# Patient Record
Sex: Male | Born: 1940 | Race: White | Hispanic: No | Marital: Single | Smoking: Former smoker
Health system: Southern US, Community
[De-identification: ages and names within clinical notes are randomized; demographics above are authoritative.]

## PROBLEM LIST (undated history)

## (undated) DIAGNOSIS — E785 Hyperlipidemia, unspecified: Secondary | ICD-10-CM

## (undated) DIAGNOSIS — N189 Chronic kidney disease, unspecified: Secondary | ICD-10-CM

## (undated) DIAGNOSIS — E119 Type 2 diabetes mellitus without complications: Secondary | ICD-10-CM

## (undated) DIAGNOSIS — I10 Essential (primary) hypertension: Secondary | ICD-10-CM

## (undated) HISTORY — DX: Chronic kidney disease, unspecified: N18.9

## (undated) HISTORY — DX: Essential (primary) hypertension: I10

## (undated) HISTORY — DX: Type 2 diabetes mellitus without complications: E11.9

## (undated) HISTORY — DX: Hyperlipidemia, unspecified: E78.5

---

## 2001-10-21 ENCOUNTER — Encounter: Admission: RE | Admit: 2001-10-21 | Discharge: 2001-10-21 | Payer: Self-pay | Admitting: *Deleted

## 2001-10-28 ENCOUNTER — Encounter: Admission: RE | Admit: 2001-10-28 | Discharge: 2001-10-28 | Payer: Self-pay | Admitting: Internal Medicine

## 2003-04-13 ENCOUNTER — Encounter (INDEPENDENT_AMBULATORY_CARE_PROVIDER_SITE_OTHER): Payer: Self-pay | Admitting: *Deleted

## 2003-04-13 ENCOUNTER — Ambulatory Visit (HOSPITAL_COMMUNITY): Admission: RE | Admit: 2003-04-13 | Discharge: 2003-04-13 | Payer: Self-pay | Admitting: *Deleted

## 2003-05-18 ENCOUNTER — Ambulatory Visit (HOSPITAL_COMMUNITY): Admission: RE | Admit: 2003-05-18 | Discharge: 2003-05-18 | Payer: Self-pay | Admitting: *Deleted

## 2003-05-18 ENCOUNTER — Encounter (INDEPENDENT_AMBULATORY_CARE_PROVIDER_SITE_OTHER): Payer: Self-pay | Admitting: Specialist

## 2003-07-20 ENCOUNTER — Encounter (INDEPENDENT_AMBULATORY_CARE_PROVIDER_SITE_OTHER): Payer: Self-pay | Admitting: *Deleted

## 2003-07-20 ENCOUNTER — Ambulatory Visit (HOSPITAL_COMMUNITY): Admission: RE | Admit: 2003-07-20 | Discharge: 2003-07-20 | Payer: Self-pay | Admitting: *Deleted

## 2003-11-30 ENCOUNTER — Encounter (INDEPENDENT_AMBULATORY_CARE_PROVIDER_SITE_OTHER): Payer: Self-pay | Admitting: Specialist

## 2003-11-30 ENCOUNTER — Ambulatory Visit (HOSPITAL_COMMUNITY): Admission: RE | Admit: 2003-11-30 | Discharge: 2003-11-30 | Payer: Self-pay | Admitting: *Deleted

## 2004-05-25 ENCOUNTER — Encounter (INDEPENDENT_AMBULATORY_CARE_PROVIDER_SITE_OTHER): Payer: Self-pay | Admitting: *Deleted

## 2004-05-25 ENCOUNTER — Ambulatory Visit (HOSPITAL_COMMUNITY): Admission: RE | Admit: 2004-05-25 | Discharge: 2004-05-25 | Payer: Self-pay | Admitting: *Deleted

## 2005-01-11 ENCOUNTER — Emergency Department (HOSPITAL_COMMUNITY): Admission: EM | Admit: 2005-01-11 | Discharge: 2005-01-12 | Payer: Self-pay | Admitting: Emergency Medicine

## 2008-03-10 ENCOUNTER — Encounter: Admission: RE | Admit: 2008-03-10 | Discharge: 2008-03-10 | Payer: Self-pay | Admitting: Family Medicine

## 2008-03-11 ENCOUNTER — Encounter: Admission: RE | Admit: 2008-03-11 | Discharge: 2008-03-11 | Payer: Self-pay | Admitting: Family Medicine

## 2010-07-01 ENCOUNTER — Ambulatory Visit (HOSPITAL_COMMUNITY): Admission: RE | Admit: 2010-07-01 | Discharge: 2010-07-01 | Payer: Self-pay | Admitting: Nephrology

## 2010-07-08 ENCOUNTER — Encounter (HOSPITAL_COMMUNITY): Admission: RE | Admit: 2010-07-08 | Discharge: 2010-09-15 | Payer: Self-pay | Admitting: Nephrology

## 2010-07-12 ENCOUNTER — Encounter: Admission: RE | Admit: 2010-07-12 | Discharge: 2010-07-12 | Payer: Self-pay | Admitting: Nephrology

## 2011-03-03 NOTE — Op Note (Signed)
   NAME:  [name], Douglas                       ACCOUNT NO.:  [id number]   MEDICAL RECORD NO.:  [id number]                   PATIENT TYPE:  AMB   LOCATION:  ENDO                                 FACILITY:  [facility]   PHYSICIAN:  Althea Grimmer. Luther Parody, M.D.            DATE OF BIRTH:  1940/12/21   DATE OF PROCEDURE:  05/18/2003  DATE OF DISCHARGE:                                 OPERATIVE REPORT   PROCEDURE PERFORMED:  Flexible sigmoidoscopy with shave polypectomy and ERBE  photocoagulation.   INDICATIONS FOR PROCEDURE:  A large villous adenoma of the rectum  incompletely removed at prior colonoscopy on June 28.   PREPARATION:  The patient is n.p.o. since midnight having taken Phospho-Soda  prep and a clear liquid diet.  The mucosa is clean.  Depth of insertion 30  cm.   DESCRIPTION OF PROCEDURE:  The Olympus video colonoscope was inserted via  the rectum and advanced only to 30 cm since a prior complete colonoscopy has  been performed recently.  On withdrawal, the rectal villous adenoma was  located extending from approximately 4 or 5 cm from the anal verge almost to  the anal verge itself.  The polyp was extensively removed  with electro  snare cautery shave technique.  It appeared much smaller than it had at the  original treatment.  When it had largely been removed, the base was  photocoagulated with the ERBE photocoagulation instrument. There was no  significant bleeding.  The patient tolerated the procedure well without  complications or pain.  He will be observed in recovery for one hour and  discharged if alert with a benign abdomen.  He is advised not to take  aspirin for the next 10 days.  He will be sent a biopsy report.  I would  like to see him in the office within six to seven weeks and probably  reassess this site endoscopically in approximately two months to make  certain the polyp is completely removed.                                               Althea Grimmer. Luther Parody,  M.D.    PJS/MEDQ  D:  05/18/2003  T:  05/18/2003  Job:  [id number]   cc:   Schuyler Amor, M.D.  [address]  [place], [place] [address]  Fax: [phone]

## 2011-05-18 ENCOUNTER — Ambulatory Visit: Payer: Federal, State, Local not specified - PPO | Attending: Family Medicine

## 2011-05-18 DIAGNOSIS — R293 Abnormal posture: Secondary | ICD-10-CM | POA: Insufficient documentation

## 2011-05-18 DIAGNOSIS — M546 Pain in thoracic spine: Secondary | ICD-10-CM | POA: Insufficient documentation

## 2011-05-18 DIAGNOSIS — M545 Low back pain, unspecified: Secondary | ICD-10-CM | POA: Insufficient documentation

## 2011-05-18 DIAGNOSIS — IMO0001 Reserved for inherently not codable concepts without codable children: Secondary | ICD-10-CM | POA: Insufficient documentation

## 2011-05-23 ENCOUNTER — Ambulatory Visit: Payer: Federal, State, Local not specified - PPO

## 2011-05-25 ENCOUNTER — Ambulatory Visit: Payer: Federal, State, Local not specified - PPO

## 2011-05-30 ENCOUNTER — Ambulatory Visit: Payer: Federal, State, Local not specified - PPO | Admitting: Physical Therapy

## 2011-06-01 ENCOUNTER — Ambulatory Visit: Payer: Federal, State, Local not specified - PPO

## 2011-06-08 ENCOUNTER — Ambulatory Visit: Payer: Federal, State, Local not specified - PPO

## 2011-06-15 ENCOUNTER — Ambulatory Visit: Payer: Federal, State, Local not specified - PPO

## 2011-06-20 ENCOUNTER — Ambulatory Visit: Payer: Medicare Other | Attending: Family Medicine | Admitting: Physical Therapy

## 2011-06-20 DIAGNOSIS — R293 Abnormal posture: Secondary | ICD-10-CM | POA: Insufficient documentation

## 2011-06-20 DIAGNOSIS — IMO0001 Reserved for inherently not codable concepts without codable children: Secondary | ICD-10-CM | POA: Insufficient documentation

## 2011-06-20 DIAGNOSIS — M545 Low back pain, unspecified: Secondary | ICD-10-CM | POA: Insufficient documentation

## 2011-06-20 DIAGNOSIS — M546 Pain in thoracic spine: Secondary | ICD-10-CM | POA: Insufficient documentation

## 2012-02-13 ENCOUNTER — Ambulatory Visit
Admission: RE | Admit: 2012-02-13 | Discharge: 2012-02-13 | Disposition: A | Discharge status: Home / Self Care | Payer: Federal, State, Local not specified - PPO | Source: Ambulatory Visit | Attending: Family Medicine | Admitting: Family Medicine

## 2012-02-13 ENCOUNTER — Other Ambulatory Visit: Payer: Self-pay | Admitting: Family Medicine

## 2012-02-13 DIAGNOSIS — M25539 Pain in unspecified wrist: Secondary | ICD-10-CM

## 2012-05-21 ENCOUNTER — Other Ambulatory Visit (HOSPITAL_COMMUNITY): Payer: Self-pay | Admitting: *Deleted

## 2012-05-22 ENCOUNTER — Encounter (HOSPITAL_COMMUNITY)
Admission: RE | Admit: 2012-05-22 | Discharge: 2012-05-22 | Disposition: A | Discharge status: Home / Self Care | Payer: Medicare Other | Source: Ambulatory Visit | Attending: Nephrology | Admitting: Nephrology

## 2012-05-22 DIAGNOSIS — D509 Iron deficiency anemia, unspecified: Secondary | ICD-10-CM | POA: Insufficient documentation

## 2012-05-22 MED ORDER — FERUMOXYTOL INJECTION 510 MG/17 ML
510.0000 mg | INTRAVENOUS | Status: DC
  Administered 2012-05-22: 510 mg via INTRAVENOUS

## 2012-05-22 MED ORDER — FERUMOXYTOL INJECTION 510 MG/17 ML
INTRAVENOUS | Status: AC
  Filled 2012-05-22: qty 17

## 2012-05-22 MED ORDER — SODIUM CHLORIDE 0.9 % IV SOLN
INTRAVENOUS | Status: DC
  Administered 2012-05-22: 12:00:00 via INTRAVENOUS

## 2012-05-23 ENCOUNTER — Other Ambulatory Visit: Payer: Self-pay | Admitting: Family Medicine

## 2012-05-23 DIAGNOSIS — E611 Iron deficiency: Secondary | ICD-10-CM

## 2012-05-28 ENCOUNTER — Other Ambulatory Visit (HOSPITAL_COMMUNITY): Payer: Self-pay | Admitting: *Deleted

## 2012-05-29 ENCOUNTER — Encounter (HOSPITAL_COMMUNITY)
Admission: RE | Admit: 2012-05-29 | Discharge: 2012-05-29 | Disposition: A | Discharge status: Home / Self Care | Payer: Medicare Other | Source: Ambulatory Visit | Attending: Nephrology | Admitting: Nephrology

## 2012-05-29 MED ORDER — FERUMOXYTOL INJECTION 510 MG/17 ML
510.0000 mg | INTRAVENOUS | Status: AC
  Administered 2012-05-29: 510 mg via INTRAVENOUS
  Filled 2012-05-29: qty 17

## 2012-05-29 MED ORDER — SODIUM CHLORIDE 0.9 % IV SOLN
INTRAVENOUS | Status: AC
  Administered 2012-05-29: 08:00:00 via INTRAVENOUS

## 2012-11-22 DIAGNOSIS — R972 Elevated prostate specific antigen [PSA]: Secondary | ICD-10-CM | POA: Diagnosis not present

## 2012-11-22 DIAGNOSIS — N401 Enlarged prostate with lower urinary tract symptoms: Secondary | ICD-10-CM | POA: Diagnosis not present

## 2012-12-09 DIAGNOSIS — E782 Mixed hyperlipidemia: Secondary | ICD-10-CM | POA: Diagnosis not present

## 2012-12-09 DIAGNOSIS — M109 Gout, unspecified: Secondary | ICD-10-CM | POA: Diagnosis not present

## 2012-12-09 DIAGNOSIS — E1129 Type 2 diabetes mellitus with other diabetic kidney complication: Secondary | ICD-10-CM | POA: Diagnosis not present

## 2012-12-09 DIAGNOSIS — I10 Essential (primary) hypertension: Secondary | ICD-10-CM | POA: Diagnosis not present

## 2013-01-09 DIAGNOSIS — R7989 Other specified abnormal findings of blood chemistry: Secondary | ICD-10-CM | POA: Diagnosis not present

## 2013-01-09 DIAGNOSIS — E785 Hyperlipidemia, unspecified: Secondary | ICD-10-CM | POA: Diagnosis not present

## 2013-01-09 DIAGNOSIS — E1165 Type 2 diabetes mellitus with hyperglycemia: Secondary | ICD-10-CM | POA: Diagnosis not present

## 2013-01-09 DIAGNOSIS — I10 Essential (primary) hypertension: Secondary | ICD-10-CM | POA: Diagnosis not present

## 2013-01-09 DIAGNOSIS — Z794 Long term (current) use of insulin: Secondary | ICD-10-CM | POA: Diagnosis not present

## 2013-01-13 DIAGNOSIS — E039 Hypothyroidism, unspecified: Secondary | ICD-10-CM | POA: Diagnosis not present

## 2013-01-13 DIAGNOSIS — Z794 Long term (current) use of insulin: Secondary | ICD-10-CM | POA: Diagnosis not present

## 2013-01-13 DIAGNOSIS — E1129 Type 2 diabetes mellitus with other diabetic kidney complication: Secondary | ICD-10-CM | POA: Diagnosis not present

## 2013-01-13 DIAGNOSIS — E785 Hyperlipidemia, unspecified: Secondary | ICD-10-CM | POA: Diagnosis not present

## 2013-01-13 DIAGNOSIS — I10 Essential (primary) hypertension: Secondary | ICD-10-CM | POA: Diagnosis not present

## 2013-03-04 DIAGNOSIS — M109 Gout, unspecified: Secondary | ICD-10-CM | POA: Diagnosis not present

## 2013-03-04 DIAGNOSIS — I129 Hypertensive chronic kidney disease with stage 1 through stage 4 chronic kidney disease, or unspecified chronic kidney disease: Secondary | ICD-10-CM | POA: Diagnosis not present

## 2013-03-04 DIAGNOSIS — E119 Type 2 diabetes mellitus without complications: Secondary | ICD-10-CM | POA: Diagnosis not present

## 2013-05-11 DIAGNOSIS — L03119 Cellulitis of unspecified part of limb: Secondary | ICD-10-CM | POA: Diagnosis not present

## 2013-05-11 DIAGNOSIS — S61409A Unspecified open wound of unspecified hand, initial encounter: Secondary | ICD-10-CM | POA: Diagnosis not present

## 2013-05-11 DIAGNOSIS — L02519 Cutaneous abscess of unspecified hand: Secondary | ICD-10-CM | POA: Diagnosis not present

## 2013-05-12 DIAGNOSIS — E039 Hypothyroidism, unspecified: Secondary | ICD-10-CM | POA: Diagnosis not present

## 2013-05-12 DIAGNOSIS — S61409A Unspecified open wound of unspecified hand, initial encounter: Secondary | ICD-10-CM | POA: Diagnosis not present

## 2013-06-09 DIAGNOSIS — M109 Gout, unspecified: Secondary | ICD-10-CM | POA: Diagnosis not present

## 2013-06-09 DIAGNOSIS — E039 Hypothyroidism, unspecified: Secondary | ICD-10-CM | POA: Diagnosis not present

## 2013-06-09 DIAGNOSIS — E1129 Type 2 diabetes mellitus with other diabetic kidney complication: Secondary | ICD-10-CM | POA: Diagnosis not present

## 2013-06-09 DIAGNOSIS — I10 Essential (primary) hypertension: Secondary | ICD-10-CM | POA: Diagnosis not present

## 2013-06-09 DIAGNOSIS — E785 Hyperlipidemia, unspecified: Secondary | ICD-10-CM | POA: Diagnosis not present

## 2013-07-03 DIAGNOSIS — R1110 Vomiting, unspecified: Secondary | ICD-10-CM | POA: Diagnosis not present

## 2013-07-03 DIAGNOSIS — R5381 Other malaise: Secondary | ICD-10-CM | POA: Diagnosis not present

## 2013-07-07 DIAGNOSIS — D509 Iron deficiency anemia, unspecified: Secondary | ICD-10-CM | POA: Diagnosis not present

## 2013-07-07 DIAGNOSIS — D649 Anemia, unspecified: Secondary | ICD-10-CM | POA: Diagnosis not present

## 2013-07-18 DIAGNOSIS — Z23 Encounter for immunization: Secondary | ICD-10-CM | POA: Diagnosis not present

## 2013-07-22 DIAGNOSIS — D509 Iron deficiency anemia, unspecified: Secondary | ICD-10-CM | POA: Diagnosis not present

## 2013-08-11 DIAGNOSIS — D649 Anemia, unspecified: Secondary | ICD-10-CM | POA: Diagnosis not present

## 2013-09-04 DIAGNOSIS — E119 Type 2 diabetes mellitus without complications: Secondary | ICD-10-CM | POA: Diagnosis not present

## 2013-09-04 DIAGNOSIS — D649 Anemia, unspecified: Secondary | ICD-10-CM | POA: Diagnosis not present

## 2013-09-04 DIAGNOSIS — I129 Hypertensive chronic kidney disease with stage 1 through stage 4 chronic kidney disease, or unspecified chronic kidney disease: Secondary | ICD-10-CM | POA: Diagnosis not present

## 2013-09-29 DIAGNOSIS — E039 Hypothyroidism, unspecified: Secondary | ICD-10-CM | POA: Diagnosis not present

## 2013-09-29 DIAGNOSIS — E785 Hyperlipidemia, unspecified: Secondary | ICD-10-CM | POA: Diagnosis not present

## 2013-09-29 DIAGNOSIS — I10 Essential (primary) hypertension: Secondary | ICD-10-CM | POA: Diagnosis not present

## 2013-09-29 DIAGNOSIS — E1129 Type 2 diabetes mellitus with other diabetic kidney complication: Secondary | ICD-10-CM | POA: Diagnosis not present

## 2013-09-29 DIAGNOSIS — Z79899 Other long term (current) drug therapy: Secondary | ICD-10-CM | POA: Diagnosis not present

## 2013-11-10 DIAGNOSIS — Z79899 Other long term (current) drug therapy: Secondary | ICD-10-CM | POA: Diagnosis not present

## 2013-11-10 DIAGNOSIS — E785 Hyperlipidemia, unspecified: Secondary | ICD-10-CM | POA: Diagnosis not present

## 2013-11-24 DIAGNOSIS — R972 Elevated prostate specific antigen [PSA]: Secondary | ICD-10-CM | POA: Diagnosis not present

## 2013-11-30 ENCOUNTER — Encounter: Payer: Self-pay | Admitting: *Deleted

## 2013-11-30 DIAGNOSIS — I10 Essential (primary) hypertension: Secondary | ICD-10-CM | POA: Insufficient documentation

## 2013-11-30 DIAGNOSIS — E119 Type 2 diabetes mellitus without complications: Secondary | ICD-10-CM

## 2013-11-30 DIAGNOSIS — E785 Hyperlipidemia, unspecified: Secondary | ICD-10-CM

## 2013-12-10 DIAGNOSIS — E1129 Type 2 diabetes mellitus with other diabetic kidney complication: Secondary | ICD-10-CM | POA: Diagnosis not present

## 2013-12-10 DIAGNOSIS — N183 Chronic kidney disease, stage 3 unspecified: Secondary | ICD-10-CM | POA: Diagnosis not present

## 2013-12-24 DIAGNOSIS — Z0000 Encounter for general adult medical examination without abnormal findings: Secondary | ICD-10-CM | POA: Diagnosis not present

## 2014-02-20 DIAGNOSIS — R972 Elevated prostate specific antigen [PSA]: Secondary | ICD-10-CM | POA: Diagnosis not present

## 2014-02-20 DIAGNOSIS — N401 Enlarged prostate with lower urinary tract symptoms: Secondary | ICD-10-CM | POA: Diagnosis not present

## 2014-03-11 DIAGNOSIS — R972 Elevated prostate specific antigen [PSA]: Secondary | ICD-10-CM | POA: Diagnosis not present

## 2014-03-25 DIAGNOSIS — Z23 Encounter for immunization: Secondary | ICD-10-CM | POA: Diagnosis not present

## 2014-03-25 DIAGNOSIS — E1129 Type 2 diabetes mellitus with other diabetic kidney complication: Secondary | ICD-10-CM | POA: Diagnosis not present

## 2014-03-25 DIAGNOSIS — N183 Chronic kidney disease, stage 3 unspecified: Secondary | ICD-10-CM | POA: Diagnosis not present

## 2014-03-30 DIAGNOSIS — E785 Hyperlipidemia, unspecified: Secondary | ICD-10-CM | POA: Diagnosis not present

## 2014-03-30 DIAGNOSIS — E039 Hypothyroidism, unspecified: Secondary | ICD-10-CM | POA: Diagnosis not present

## 2014-03-30 DIAGNOSIS — I10 Essential (primary) hypertension: Secondary | ICD-10-CM | POA: Diagnosis not present

## 2014-03-30 DIAGNOSIS — M109 Gout, unspecified: Secondary | ICD-10-CM | POA: Diagnosis not present

## 2014-05-25 DIAGNOSIS — E039 Hypothyroidism, unspecified: Secondary | ICD-10-CM | POA: Diagnosis not present

## 2014-08-07 DIAGNOSIS — Z23 Encounter for immunization: Secondary | ICD-10-CM | POA: Diagnosis not present

## 2014-09-01 DIAGNOSIS — D649 Anemia, unspecified: Secondary | ICD-10-CM | POA: Diagnosis not present

## 2014-09-01 DIAGNOSIS — N183 Chronic kidney disease, stage 3 (moderate): Secondary | ICD-10-CM | POA: Diagnosis not present

## 2014-09-01 DIAGNOSIS — E119 Type 2 diabetes mellitus without complications: Secondary | ICD-10-CM | POA: Diagnosis not present

## 2014-09-01 DIAGNOSIS — I10 Essential (primary) hypertension: Secondary | ICD-10-CM | POA: Diagnosis not present

## 2014-09-15 DIAGNOSIS — R351 Nocturia: Secondary | ICD-10-CM | POA: Diagnosis not present

## 2014-09-15 DIAGNOSIS — N401 Enlarged prostate with lower urinary tract symptoms: Secondary | ICD-10-CM | POA: Diagnosis not present

## 2014-09-15 DIAGNOSIS — R972 Elevated prostate specific antigen [PSA]: Secondary | ICD-10-CM | POA: Diagnosis not present

## 2014-09-28 DIAGNOSIS — E119 Type 2 diabetes mellitus without complications: Secondary | ICD-10-CM | POA: Diagnosis not present

## 2014-09-29 DIAGNOSIS — E1122 Type 2 diabetes mellitus with diabetic chronic kidney disease: Secondary | ICD-10-CM | POA: Diagnosis not present

## 2014-09-29 DIAGNOSIS — N183 Chronic kidney disease, stage 3 (moderate): Secondary | ICD-10-CM | POA: Diagnosis not present

## 2014-09-30 DIAGNOSIS — M109 Gout, unspecified: Secondary | ICD-10-CM | POA: Diagnosis not present

## 2014-09-30 DIAGNOSIS — E785 Hyperlipidemia, unspecified: Secondary | ICD-10-CM | POA: Diagnosis not present

## 2014-09-30 DIAGNOSIS — I10 Essential (primary) hypertension: Secondary | ICD-10-CM | POA: Diagnosis not present

## 2014-09-30 DIAGNOSIS — E039 Hypothyroidism, unspecified: Secondary | ICD-10-CM | POA: Diagnosis not present

## 2014-12-02 DIAGNOSIS — E039 Hypothyroidism, unspecified: Secondary | ICD-10-CM | POA: Diagnosis not present

## 2015-01-11 DIAGNOSIS — E039 Hypothyroidism, unspecified: Secondary | ICD-10-CM | POA: Diagnosis not present

## 2015-01-11 DIAGNOSIS — E1122 Type 2 diabetes mellitus with diabetic chronic kidney disease: Secondary | ICD-10-CM | POA: Diagnosis not present

## 2015-01-13 DIAGNOSIS — N183 Chronic kidney disease, stage 3 (moderate): Secondary | ICD-10-CM | POA: Diagnosis not present

## 2015-01-13 DIAGNOSIS — Z1389 Encounter for screening for other disorder: Secondary | ICD-10-CM | POA: Diagnosis not present

## 2015-01-13 DIAGNOSIS — E1122 Type 2 diabetes mellitus with diabetic chronic kidney disease: Secondary | ICD-10-CM | POA: Diagnosis not present

## 2015-01-13 DIAGNOSIS — Z794 Long term (current) use of insulin: Secondary | ICD-10-CM | POA: Diagnosis not present

## 2015-04-05 DIAGNOSIS — E039 Hypothyroidism, unspecified: Secondary | ICD-10-CM | POA: Diagnosis not present

## 2015-04-05 DIAGNOSIS — M109 Gout, unspecified: Secondary | ICD-10-CM | POA: Diagnosis not present

## 2015-04-05 DIAGNOSIS — I10 Essential (primary) hypertension: Secondary | ICD-10-CM | POA: Diagnosis not present

## 2015-04-05 DIAGNOSIS — E785 Hyperlipidemia, unspecified: Secondary | ICD-10-CM | POA: Diagnosis not present

## 2015-04-22 DIAGNOSIS — N183 Chronic kidney disease, stage 3 (moderate): Secondary | ICD-10-CM | POA: Diagnosis not present

## 2015-04-22 DIAGNOSIS — Z794 Long term (current) use of insulin: Secondary | ICD-10-CM | POA: Diagnosis not present

## 2015-04-22 DIAGNOSIS — E1122 Type 2 diabetes mellitus with diabetic chronic kidney disease: Secondary | ICD-10-CM | POA: Diagnosis not present

## 2015-08-30 DIAGNOSIS — N401 Enlarged prostate with lower urinary tract symptoms: Secondary | ICD-10-CM | POA: Diagnosis not present

## 2015-09-20 DIAGNOSIS — N401 Enlarged prostate with lower urinary tract symptoms: Secondary | ICD-10-CM | POA: Diagnosis not present

## 2015-09-20 DIAGNOSIS — R351 Nocturia: Secondary | ICD-10-CM | POA: Diagnosis not present

## 2015-09-20 DIAGNOSIS — R972 Elevated prostate specific antigen [PSA]: Secondary | ICD-10-CM | POA: Diagnosis not present

## 2015-09-20 DIAGNOSIS — N138 Other obstructive and reflux uropathy: Secondary | ICD-10-CM | POA: Diagnosis not present

## 2015-09-29 DIAGNOSIS — E785 Hyperlipidemia, unspecified: Secondary | ICD-10-CM | POA: Diagnosis not present

## 2015-09-29 DIAGNOSIS — M109 Gout, unspecified: Secondary | ICD-10-CM | POA: Diagnosis not present

## 2015-09-29 DIAGNOSIS — Z0000 Encounter for general adult medical examination without abnormal findings: Secondary | ICD-10-CM | POA: Diagnosis not present

## 2015-09-29 DIAGNOSIS — I10 Essential (primary) hypertension: Secondary | ICD-10-CM | POA: Diagnosis not present

## 2015-09-29 DIAGNOSIS — E1122 Type 2 diabetes mellitus with diabetic chronic kidney disease: Secondary | ICD-10-CM | POA: Diagnosis not present

## 2015-09-29 DIAGNOSIS — N183 Chronic kidney disease, stage 3 (moderate): Secondary | ICD-10-CM | POA: Diagnosis not present

## 2015-09-29 DIAGNOSIS — N400 Enlarged prostate without lower urinary tract symptoms: Secondary | ICD-10-CM | POA: Diagnosis not present

## 2015-09-29 DIAGNOSIS — E039 Hypothyroidism, unspecified: Secondary | ICD-10-CM | POA: Diagnosis not present

## 2015-10-01 ENCOUNTER — Other Ambulatory Visit: Payer: Self-pay | Admitting: Family Medicine

## 2015-10-01 DIAGNOSIS — R945 Abnormal results of liver function studies: Principal | ICD-10-CM

## 2015-10-01 DIAGNOSIS — R7989 Other specified abnormal findings of blood chemistry: Secondary | ICD-10-CM

## 2015-10-06 DIAGNOSIS — E119 Type 2 diabetes mellitus without complications: Secondary | ICD-10-CM | POA: Diagnosis not present

## 2015-10-08 ENCOUNTER — Ambulatory Visit
Admission: RE | Admit: 2015-10-08 | Discharge: 2015-10-08 | Disposition: A | Discharge status: Home / Self Care | Payer: Medicare Other | Source: Ambulatory Visit | Attending: Family Medicine | Admitting: Family Medicine

## 2015-10-08 DIAGNOSIS — R7989 Other specified abnormal findings of blood chemistry: Secondary | ICD-10-CM

## 2015-10-08 DIAGNOSIS — R945 Abnormal results of liver function studies: Principal | ICD-10-CM

## 2015-10-27 DIAGNOSIS — E1122 Type 2 diabetes mellitus with diabetic chronic kidney disease: Secondary | ICD-10-CM | POA: Diagnosis not present

## 2015-10-27 DIAGNOSIS — Z794 Long term (current) use of insulin: Secondary | ICD-10-CM | POA: Diagnosis not present

## 2015-10-27 DIAGNOSIS — R945 Abnormal results of liver function studies: Secondary | ICD-10-CM | POA: Diagnosis not present

## 2015-10-27 DIAGNOSIS — N183 Chronic kidney disease, stage 3 (moderate): Secondary | ICD-10-CM | POA: Diagnosis not present

## 2015-11-11 DIAGNOSIS — K2270 Barrett's esophagus without dysplasia: Secondary | ICD-10-CM | POA: Diagnosis not present

## 2015-11-11 DIAGNOSIS — Z86010 Personal history of colonic polyps: Secondary | ICD-10-CM | POA: Diagnosis not present

## 2015-11-11 DIAGNOSIS — K219 Gastro-esophageal reflux disease without esophagitis: Secondary | ICD-10-CM | POA: Diagnosis not present

## 2015-11-22 DIAGNOSIS — E119 Type 2 diabetes mellitus without complications: Secondary | ICD-10-CM | POA: Diagnosis not present

## 2015-11-22 DIAGNOSIS — Z794 Long term (current) use of insulin: Secondary | ICD-10-CM | POA: Diagnosis not present

## 2015-11-30 DIAGNOSIS — R945 Abnormal results of liver function studies: Secondary | ICD-10-CM | POA: Diagnosis not present

## 2015-12-27 DIAGNOSIS — K640 First degree hemorrhoids: Secondary | ICD-10-CM | POA: Diagnosis not present

## 2015-12-27 DIAGNOSIS — Z86010 Personal history of colonic polyps: Secondary | ICD-10-CM | POA: Diagnosis not present

## 2015-12-27 DIAGNOSIS — K5730 Diverticulosis of large intestine without perforation or abscess without bleeding: Secondary | ICD-10-CM | POA: Diagnosis not present

## 2015-12-27 DIAGNOSIS — K219 Gastro-esophageal reflux disease without esophagitis: Secondary | ICD-10-CM | POA: Diagnosis not present

## 2015-12-27 DIAGNOSIS — K317 Polyp of stomach and duodenum: Secondary | ICD-10-CM | POA: Diagnosis not present

## 2015-12-27 DIAGNOSIS — K3189 Other diseases of stomach and duodenum: Secondary | ICD-10-CM | POA: Diagnosis not present

## 2015-12-27 DIAGNOSIS — K2270 Barrett's esophagus without dysplasia: Secondary | ICD-10-CM | POA: Diagnosis not present

## 2015-12-27 DIAGNOSIS — K621 Rectal polyp: Secondary | ICD-10-CM | POA: Diagnosis not present

## 2016-03-08 DIAGNOSIS — R945 Abnormal results of liver function studies: Secondary | ICD-10-CM | POA: Diagnosis not present

## 2016-04-25 DIAGNOSIS — Z794 Long term (current) use of insulin: Secondary | ICD-10-CM | POA: Diagnosis not present

## 2016-04-25 DIAGNOSIS — E1122 Type 2 diabetes mellitus with diabetic chronic kidney disease: Secondary | ICD-10-CM | POA: Diagnosis not present

## 2016-04-25 DIAGNOSIS — N183 Chronic kidney disease, stage 3 (moderate): Secondary | ICD-10-CM | POA: Diagnosis not present

## 2016-04-25 DIAGNOSIS — D509 Iron deficiency anemia, unspecified: Secondary | ICD-10-CM | POA: Diagnosis not present

## 2016-04-25 DIAGNOSIS — R945 Abnormal results of liver function studies: Secondary | ICD-10-CM | POA: Diagnosis not present

## 2016-06-27 DIAGNOSIS — Z23 Encounter for immunization: Secondary | ICD-10-CM | POA: Diagnosis not present

## 2016-09-05 DIAGNOSIS — J329 Chronic sinusitis, unspecified: Secondary | ICD-10-CM | POA: Diagnosis not present

## 2016-09-26 ENCOUNTER — Encounter (HOSPITAL_COMMUNITY): Payer: Self-pay | Admitting: Emergency Medicine

## 2016-09-26 ENCOUNTER — Emergency Department (HOSPITAL_COMMUNITY): Payer: Medicare Other

## 2016-09-26 ENCOUNTER — Inpatient Hospital Stay (HOSPITAL_COMMUNITY)
Admission: EM | Admit: 2016-09-26 | Discharge: 2016-10-05 | DRG: 064 | Disposition: A | Discharge status: Rehabilitation Facility | Payer: Medicare Other | Attending: Internal Medicine | Admitting: Internal Medicine

## 2016-09-26 DIAGNOSIS — N390 Urinary tract infection, site not specified: Secondary | ICD-10-CM | POA: Diagnosis not present

## 2016-09-26 DIAGNOSIS — I69320 Aphasia following cerebral infarction: Secondary | ICD-10-CM | POA: Diagnosis not present

## 2016-09-26 DIAGNOSIS — R748 Abnormal levels of other serum enzymes: Secondary | ICD-10-CM | POA: Diagnosis not present

## 2016-09-26 DIAGNOSIS — I69390 Apraxia following cerebral infarction: Secondary | ICD-10-CM | POA: Diagnosis not present

## 2016-09-26 DIAGNOSIS — N471 Phimosis: Secondary | ICD-10-CM | POA: Diagnosis present

## 2016-09-26 DIAGNOSIS — I6310 Cerebral infarction due to embolism of unspecified precerebral artery: Secondary | ICD-10-CM

## 2016-09-26 DIAGNOSIS — R092 Respiratory arrest: Secondary | ICD-10-CM | POA: Diagnosis not present

## 2016-09-26 DIAGNOSIS — E11649 Type 2 diabetes mellitus with hypoglycemia without coma: Secondary | ICD-10-CM | POA: Diagnosis present

## 2016-09-26 DIAGNOSIS — Z7984 Long term (current) use of oral hypoglycemic drugs: Secondary | ICD-10-CM | POA: Diagnosis not present

## 2016-09-26 DIAGNOSIS — Z66 Do not resuscitate: Secondary | ICD-10-CM | POA: Diagnosis present

## 2016-09-26 DIAGNOSIS — I10 Essential (primary) hypertension: Secondary | ICD-10-CM | POA: Diagnosis present

## 2016-09-26 DIAGNOSIS — Z79899 Other long term (current) drug therapy: Secondary | ICD-10-CM | POA: Diagnosis not present

## 2016-09-26 DIAGNOSIS — I639 Cerebral infarction, unspecified: Secondary | ICD-10-CM

## 2016-09-26 DIAGNOSIS — G9340 Encephalopathy, unspecified: Secondary | ICD-10-CM | POA: Diagnosis not present

## 2016-09-26 DIAGNOSIS — B9689 Other specified bacterial agents as the cause of diseases classified elsewhere: Secondary | ICD-10-CM | POA: Diagnosis present

## 2016-09-26 DIAGNOSIS — R338 Other retention of urine: Secondary | ICD-10-CM | POA: Diagnosis not present

## 2016-09-26 DIAGNOSIS — N183 Chronic kidney disease, stage 3 (moderate): Secondary | ICD-10-CM | POA: Diagnosis present

## 2016-09-26 DIAGNOSIS — D509 Iron deficiency anemia, unspecified: Secondary | ICD-10-CM | POA: Diagnosis not present

## 2016-09-26 DIAGNOSIS — I63512 Cerebral infarction due to unspecified occlusion or stenosis of left middle cerebral artery: Secondary | ICD-10-CM | POA: Diagnosis not present

## 2016-09-26 DIAGNOSIS — I6789 Other cerebrovascular disease: Secondary | ICD-10-CM | POA: Diagnosis not present

## 2016-09-26 DIAGNOSIS — R29810 Facial weakness: Secondary | ICD-10-CM | POA: Diagnosis present

## 2016-09-26 DIAGNOSIS — R451 Restlessness and agitation: Secondary | ICD-10-CM | POA: Diagnosis not present

## 2016-09-26 DIAGNOSIS — E1122 Type 2 diabetes mellitus with diabetic chronic kidney disease: Secondary | ICD-10-CM | POA: Diagnosis present

## 2016-09-26 DIAGNOSIS — K922 Gastrointestinal hemorrhage, unspecified: Secondary | ICD-10-CM | POA: Diagnosis present

## 2016-09-26 DIAGNOSIS — N189 Chronic kidney disease, unspecified: Secondary | ICD-10-CM

## 2016-09-26 DIAGNOSIS — E039 Hypothyroidism, unspecified: Secondary | ICD-10-CM | POA: Diagnosis present

## 2016-09-26 DIAGNOSIS — D631 Anemia in chronic kidney disease: Secondary | ICD-10-CM | POA: Diagnosis present

## 2016-09-26 DIAGNOSIS — R1310 Dysphagia, unspecified: Secondary | ICD-10-CM | POA: Diagnosis present

## 2016-09-26 DIAGNOSIS — I129 Hypertensive chronic kidney disease with stage 1 through stage 4 chronic kidney disease, or unspecified chronic kidney disease: Secondary | ICD-10-CM | POA: Diagnosis not present

## 2016-09-26 DIAGNOSIS — I6300 Cerebral infarction due to thrombosis of unspecified precerebral artery: Secondary | ICD-10-CM | POA: Diagnosis not present

## 2016-09-26 DIAGNOSIS — R4701 Aphasia: Secondary | ICD-10-CM | POA: Diagnosis present

## 2016-09-26 DIAGNOSIS — E785 Hyperlipidemia, unspecified: Secondary | ICD-10-CM | POA: Diagnosis present

## 2016-09-26 DIAGNOSIS — R000 Tachycardia, unspecified: Secondary | ICD-10-CM

## 2016-09-26 DIAGNOSIS — Z7982 Long term (current) use of aspirin: Secondary | ICD-10-CM | POA: Diagnosis not present

## 2016-09-26 DIAGNOSIS — E784 Other hyperlipidemia: Secondary | ICD-10-CM | POA: Diagnosis not present

## 2016-09-26 DIAGNOSIS — R918 Other nonspecific abnormal finding of lung field: Secondary | ICD-10-CM | POA: Diagnosis not present

## 2016-09-26 DIAGNOSIS — R7989 Other specified abnormal findings of blood chemistry: Secondary | ICD-10-CM

## 2016-09-26 DIAGNOSIS — T190XXA Foreign body in urethra, initial encounter: Secondary | ICD-10-CM | POA: Diagnosis not present

## 2016-09-26 DIAGNOSIS — G459 Transient cerebral ischemic attack, unspecified: Secondary | ICD-10-CM

## 2016-09-26 DIAGNOSIS — Z87891 Personal history of nicotine dependence: Secondary | ICD-10-CM

## 2016-09-26 DIAGNOSIS — E870 Hyperosmolality and hypernatremia: Secondary | ICD-10-CM | POA: Diagnosis present

## 2016-09-26 DIAGNOSIS — J690 Pneumonitis due to inhalation of food and vomit: Secondary | ICD-10-CM | POA: Diagnosis not present

## 2016-09-26 DIAGNOSIS — R339 Retention of urine, unspecified: Secondary | ICD-10-CM | POA: Diagnosis not present

## 2016-09-26 DIAGNOSIS — Z960 Presence of urogenital implants: Secondary | ICD-10-CM | POA: Diagnosis not present

## 2016-09-26 DIAGNOSIS — I6330 Cerebral infarction due to thrombosis of unspecified cerebral artery: Secondary | ICD-10-CM | POA: Diagnosis not present

## 2016-09-26 DIAGNOSIS — R2689 Other abnormalities of gait and mobility: Secondary | ICD-10-CM | POA: Diagnosis present

## 2016-09-26 DIAGNOSIS — R410 Disorientation, unspecified: Secondary | ICD-10-CM | POA: Diagnosis not present

## 2016-09-26 DIAGNOSIS — R4702 Dysphasia: Secondary | ICD-10-CM | POA: Diagnosis not present

## 2016-09-26 DIAGNOSIS — E119 Type 2 diabetes mellitus without complications: Secondary | ICD-10-CM | POA: Diagnosis not present

## 2016-09-26 DIAGNOSIS — D72829 Elevated white blood cell count, unspecified: Secondary | ICD-10-CM | POA: Diagnosis not present

## 2016-09-26 DIAGNOSIS — D696 Thrombocytopenia, unspecified: Secondary | ICD-10-CM | POA: Diagnosis not present

## 2016-09-26 DIAGNOSIS — Z781 Physical restraint status: Secondary | ICD-10-CM

## 2016-09-26 DIAGNOSIS — E876 Hypokalemia: Secondary | ICD-10-CM | POA: Diagnosis present

## 2016-09-26 DIAGNOSIS — E46 Unspecified protein-calorie malnutrition: Secondary | ICD-10-CM | POA: Diagnosis not present

## 2016-09-26 DIAGNOSIS — Z823 Family history of stroke: Secondary | ICD-10-CM

## 2016-09-26 DIAGNOSIS — I5021 Acute systolic (congestive) heart failure: Secondary | ICD-10-CM | POA: Diagnosis not present

## 2016-09-26 DIAGNOSIS — D62 Acute posthemorrhagic anemia: Secondary | ICD-10-CM | POA: Diagnosis present

## 2016-09-26 DIAGNOSIS — J189 Pneumonia, unspecified organism: Secondary | ICD-10-CM | POA: Diagnosis not present

## 2016-09-26 DIAGNOSIS — R0902 Hypoxemia: Secondary | ICD-10-CM

## 2016-09-26 DIAGNOSIS — I63412 Cerebral infarction due to embolism of left middle cerebral artery: Secondary | ICD-10-CM | POA: Diagnosis not present

## 2016-09-26 DIAGNOSIS — D500 Iron deficiency anemia secondary to blood loss (chronic): Secondary | ICD-10-CM | POA: Diagnosis not present

## 2016-09-26 DIAGNOSIS — G8191 Hemiplegia, unspecified affecting right dominant side: Secondary | ICD-10-CM | POA: Diagnosis present

## 2016-09-26 DIAGNOSIS — D649 Anemia, unspecified: Secondary | ICD-10-CM | POA: Diagnosis present

## 2016-09-26 DIAGNOSIS — E1159 Type 2 diabetes mellitus with other circulatory complications: Secondary | ICD-10-CM | POA: Diagnosis not present

## 2016-09-26 DIAGNOSIS — F329 Major depressive disorder, single episode, unspecified: Secondary | ICD-10-CM | POA: Diagnosis present

## 2016-09-26 DIAGNOSIS — I63312 Cerebral infarction due to thrombosis of left middle cerebral artery: Secondary | ICD-10-CM | POA: Diagnosis not present

## 2016-09-26 DIAGNOSIS — R7309 Other abnormal glucose: Secondary | ICD-10-CM | POA: Diagnosis not present

## 2016-09-26 DIAGNOSIS — E1142 Type 2 diabetes mellitus with diabetic polyneuropathy: Secondary | ICD-10-CM | POA: Diagnosis present

## 2016-09-26 DIAGNOSIS — I69351 Hemiplegia and hemiparesis following cerebral infarction affecting right dominant side: Secondary | ICD-10-CM | POA: Diagnosis not present

## 2016-09-26 DIAGNOSIS — I69391 Dysphagia following cerebral infarction: Secondary | ICD-10-CM | POA: Diagnosis not present

## 2016-09-26 DIAGNOSIS — R778 Other specified abnormalities of plasma proteins: Secondary | ICD-10-CM | POA: Diagnosis present

## 2016-09-26 DIAGNOSIS — N179 Acute kidney failure, unspecified: Secondary | ICD-10-CM | POA: Diagnosis present

## 2016-09-26 DIAGNOSIS — L539 Erythematous condition, unspecified: Secondary | ICD-10-CM | POA: Diagnosis not present

## 2016-09-26 DIAGNOSIS — D61818 Other pancytopenia: Secondary | ICD-10-CM | POA: Diagnosis present

## 2016-09-26 DIAGNOSIS — I63039 Cerebral infarction due to thrombosis of unspecified carotid artery: Secondary | ICD-10-CM | POA: Diagnosis not present

## 2016-09-26 LAB — COMPREHENSIVE METABOLIC PANEL
ALT: 15 U/L — AB (ref 17–63)
ANION GAP: 12 (ref 5–15)
AST: 31 U/L (ref 15–41)
Albumin: 3.3 g/dL — ABNORMAL LOW (ref 3.5–5.0)
Alkaline Phosphatase: 135 U/L — ABNORMAL HIGH (ref 38–126)
BUN: 18 mg/dL (ref 6–20)
CHLORIDE: 105 mmol/L (ref 101–111)
CO2: 21 mmol/L — AB (ref 22–32)
Calcium: 9.4 mg/dL (ref 8.9–10.3)
Creatinine, Ser: 1.62 mg/dL — ABNORMAL HIGH (ref 0.61–1.24)
GFR calc non Af Amer: 40 mL/min — ABNORMAL LOW (ref >60)
GFR, EST AFRICAN AMERICAN: 46 mL/min — AB (ref >60)
Glucose, Bld: 89 mg/dL (ref 65–99)
Potassium: 3.7 mmol/L (ref 3.5–5.1)
SODIUM: 138 mmol/L (ref 135–145)
Total Bilirubin: 1.1 mg/dL (ref 0.3–1.2)
Total Protein: 6.7 g/dL (ref 6.5–8.1)

## 2016-09-26 LAB — I-STAT TROPONIN, ED
Troponin i, poc: 0.23 ng/mL (ref 0.00–0.08)
Troponin i, poc: 0.27 ng/mL (ref 0.00–0.08)

## 2016-09-26 LAB — I-STAT CG4 LACTIC ACID, ED: LACTIC ACID, VENOUS: 1.28 mmol/L (ref 0.5–1.9)

## 2016-09-26 LAB — I-STAT CHEM 8, ED
BUN: 20 mg/dL (ref 6–20)
CALCIUM ION: 1.21 mmol/L (ref 1.15–1.40)
CHLORIDE: 105 mmol/L (ref 101–111)
Creatinine, Ser: 1.6 mg/dL — ABNORMAL HIGH (ref 0.61–1.24)
Glucose, Bld: 85 mg/dL (ref 65–99)
HCT: 30 % — ABNORMAL LOW (ref 39.0–52.0)
Hemoglobin: 10.2 g/dL — ABNORMAL LOW (ref 13.0–17.0)
Potassium: 3.8 mmol/L (ref 3.5–5.1)
SODIUM: 138 mmol/L (ref 135–145)
TCO2: 23 mmol/L (ref 0–100)

## 2016-09-26 LAB — BRAIN NATRIURETIC PEPTIDE: B Natriuretic Peptide: 84.4 pg/mL (ref 0.0–100.0)

## 2016-09-26 MED ORDER — GADOBENATE DIMEGLUMINE 529 MG/ML IV SOLN: 15.0000 mL | Freq: Once | INTRAVENOUS | Status: DC | PRN

## 2016-09-26 MED ORDER — NALOXONE HCL 0.4 MG/ML IJ SOLN
0.4000 mg | Freq: Once | INTRAMUSCULAR | Status: AC
  Administered 2016-09-26: 0.4 mg via INTRAVENOUS
  Filled 2016-09-26: qty 1

## 2016-09-26 MED ORDER — SODIUM CHLORIDE 0.9 % IV BOLUS (SEPSIS)
500.0000 mL | Freq: Once | INTRAVENOUS | Status: AC
  Administered 2016-09-26: 500 mL via INTRAVENOUS

## 2016-09-26 NOTE — ED Notes (Signed)
Pt remains in MRI 

## 2016-09-26 NOTE — H&P (Addendum)
History and Physical    Douglas [name] ZOX:[id number] DOB: [birth year] DOA: 09/26/2016  PCP: Marjorie Smolder, MD Consultants:  None Patient coming from: home - lives with roommate; NOK: ? Niece is present tonight but patient is unable to express who he prefers to make decisions for him  Chief Complaint: stroke  HPI: Douglas [name] is a 75 y.o. male with medical history significant of HTN, [name], DM, [name] presenting with a stroke.  History is quite limited because the patient has a marked expressive aphasia.  It is difficult to tell if he has receptive speech difficulties as well.  The history is provided by his niece, who was not present and received the information from the patient's roommate.  Patient met a friend at lunch about 1230, drove himself home after.  After 5pm, roommate found him in the floor in the living room.  Patient was awake and alert but unable to speak.  No incontinence.  Speech has improved a little but it is still hard to understand him.  Denies n/w/t of arms or legs.   ED Course:  Per Dr. Roderic Palau: The patient returned from CT scan he was able to speak but was confused. He had slurred speech and right facial drooping his right arm right leg are much stronger almost back to normal. He was able to answer yes or no to questions and follow commands additional history that was learned later was patient was normal at 12:30 PM today and was found at 5 PM and arrived at the emergency department at 5:30 PM CT scan suggested left MCA stroke and MRI was recommended. Neurology was consulted and patient will be admitted to stepdown by medicine   Review of Systems: As per HPI; otherwise 10 point review of systems reviewed and negative.   Ambulatory Status: ambulates independently prior  Past Medical History:  Diagnosis Date  . CKD (chronic kidney disease)   . DM (diabetes mellitus) ([place])   . Hyperlipidemia   . Hypertension     History reviewed. No pertinent surgical  history.  Social History   Social History  . Marital status: Single    Spouse name: N/A  . Number of children: N/A  . Years of education: N/A   Occupational History  . retired    Social History Main Topics  . Smoking status: Former Smoker    Quit date: 06/30/1982  . Smokeless tobacco: Never Used     Comment: maybe not?  Marland Kitchen Alcohol use Yes     Comment: occasional  . Drug use: No  . Sexual activity: Not on file   Other Topics Concern  . Not on file   Social History Narrative  . No narrative on file    No Known Allergies  Family History  Problem Relation Age of Onset  . Heart disease Mother   . Cancer Father   . Stroke Brother 12    Prior to Admission medications   Medication Sig Start Date End Date Taking? Authorizing Provider  NON FORMULARY PT IS ON SOME TYPE OF INSULIN PER ROOMMATE, UNSURE AT THIS TIME OF WHAT KIND   Yes Historical Provider, MD  aspirin 81 MG tablet Take 81 mg by mouth daily.    Historical Provider, MD  cholecalciferol (VITAMIN D) 1000 UNITS tablet Take 1,000 Units by mouth daily.    Historical Provider, MD  colchicine (COLCRYS) 0.6 MG tablet Take 0.6 mg by mouth daily.    Historical Provider, MD  febuxostat (ULORIC) 40 MG  tablet Take 40 mg by mouth daily.    Historical Provider, MD  ferrous sulfate 325 (65 FE) MG tablet Take 325 mg by mouth daily with breakfast.    Historical Provider, MD  gemfibrozil (LOPID) 600 MG tablet Take 600 mg by mouth 2 (two) times daily before a meal.    Historical Provider, MD  levothyroxine (SYNTHROID, LEVOTHROID) 50 MCG tablet Take 50 mcg by mouth daily before breakfast.    Historical Provider, MD  lisinopril (PRINIVIL,ZESTRIL) 20 MG tablet Take 20 mg by mouth daily.    Historical Provider, MD  omeprazole (PRILOSEC) 20 MG capsule Take 20 mg by mouth daily.    Historical Provider, MD  saxagliptin HCl (ONGLYZA) 5 MG TABS tablet Take by mouth daily.    Historical Provider, MD    Physical Exam: Vitals:   09/26/16 1830  09/26/16 1915 09/26/16 1945 09/26/16 2000  BP: 135/61 106/86 142/61 110/100  Pulse: 98 93 93 96  Resp: _0 Temp:      TempSrc:      SpO2: 95% 94% 95% 96%  Weight:      Height:         General:  Appears calm and comfortable and is NAD Eyes: PERRL, EOMI, normal lids, iris; some mild dysconjugate gaze - ?acute vs. chronic ENT:  grossly normal hearing, lips & tongue, mmm; poor dentition, several teeth are absent.  Neck:  no LAD, masses or thyromegaly Cardiovascular:  RRR, no m/r/g. No LE edema.  Respiratory:  CTA bilaterally, no w/r/r. Normal respiratory effort. Abdomen:  soft, ntnd, NABS Skin:  no rash or induration seen on limited exam Musculoskeletal:  Able to move all extremities but possible mild neglect on the right.  He does not follow commands well and so it is really hard to gauge his MSK function effectively. Psychiatric: appears frustrated.  Alert, oriented to at least person but otherwise unable to assess Neurologic: Right facial droop is now subtle if present at all.  Slight dysconjugate gaze- ?acute vs. Chronic.  Does not follow directions well - ie asked to stick out his tongue and he opens his mouth, asked to wiggle his toes and he lifts his leg.  Possible partial right-sided neglect.  Definite expressive aphasia - answers yes or no questions but the reliability of those answers is suspect.  When he tries to answer other than a yes/no question, the result is word salad and incomprehensible.  Labs on Admission: I have personally reviewed following labs and imaging studies  CBC:  Recent Labs Lab 09/26/16 1834  HGB 10.2*  HCT 44.9*   Basic Metabolic Panel:  Recent Labs Lab 09/26/16 1800 09/26/16 1834  NA 138 138  K 3.7 3.8  CL 105 105  CO2 21*  --   GLUCOSE 89 85  BUN 18 20  CREATININE 1.62* 1.60*  CALCIUM 9.4  --    GFR: Estimated Creatinine Clearance: 41 mL/min (by C-G formula based on SCr of 1.6 mg/dL (H)). Liver Function Tests:  Recent  Labs Lab 09/26/16 1800  AST 31  ALT 15*  ALKPHOS 135*  BILITOT 1.1  PROT 6.7  ALBUMIN 3.3*   No results for input(s): LIPASE, AMYLASE in the last 168 hours. No results for input(s): AMMONIA in the last 168 hours. Coagulation Profile: No results for input(s): INR, PROTIME in the last 168 hours. Cardiac Enzymes: No results for input(s): CKTOTAL, CKMB, CKMBINDEX, TROPONINI in the last 168 hours. BNP (last 3 results) No results for input(s): PROBNP  in the last 8760 hours. HbA1C: No results for input(s): HGBA1C in the last 72 hours. CBG: No results for input(s): GLUCAP in the last 168 hours. Lipid Profile: No results for input(s): CHOL, HDL, LDLCALC, TRIG, CHOLHDL, LDLDIRECT in the last 72 hours. Thyroid Function Tests: No results for input(s): TSH, T4TOTAL, FREET4, T3FREE, THYROIDAB in the last 72 hours. Anemia Panel: No results for input(s): VITAMINB12, FOLATE, FERRITIN, TIBC, IRON, RETICCTPCT in the last 72 hours. Urine analysis: No results found for: COLORURINE, APPEARANCEUR, LABSPEC, PHURINE, GLUCOSEU, HGBUR, BILIRUBINUR, KETONESUR, PROTEINUR, UROBILINOGEN, NITRITE, LEUKOCYTESUR  Creatinine Clearance: Estimated Creatinine Clearance: 41 mL/min (by C-G formula based on SCr of 1.6 mg/dL (H)).  Sepsis Labs: _0 (procalcitonin:4,lacticidven:4) )No results found for this or any previous visit (from the past 240 hour(s)).   Radiological Exams on Admission: Dg Chest 2 View  Result Date: 09/26/2016 CLINICAL DATA:  Altered mental status. EXAM: CHEST  2 VIEW COMPARISON:  None. FINDINGS: Mild cardiomegaly is noted. No pneumothorax or pleural effusion is noted. Left lung is clear. Ill-defined opacity is seen in the right lung concerning for possible pneumonia or edema. Bony thorax is unremarkable. IMPRESSION: Ill-defined opacity seen in right lung concerning for possible pneumonia or edema. Electronically Signed   By: Marijo Conception, M.D.   On: 09/26/2016 19:08   Ct Head Wo  Contrast  Result Date: 09/26/2016 CLINICAL DATA:  Found down on floor today.  Aphasia EXAM: CT HEAD WITHOUT CONTRAST TECHNIQUE: Contiguous axial images were obtained from the base of the skull through the vertex without intravenous contrast. COMPARISON:  None. FINDINGS: Brain: There is no acute hemorrhage. No focal mass lesion. Suspected loss of the gray-white matter differentiation involving the left insula, inferior frontal, and anterior temporal lobes. Moderate atrophy. Ventricles slightly enlarged but felt secondary to atrophy. Vascular: Carotid artery calcifications. Questionable mildly dense distal left MCA. Skull: Mastoid air cells are clear. No fracture. Prominent mandibular condyles with degenerative changes. Sinuses/Orbits: No acute fluid levels in the sinuses. Mild mucosal thickening in the ethmoid sinuses. No acute orbital abnormality. Other: None IMPRESSION: 1. Suspect edema within the left insular cortex, anterior temporal lobe and left inferior frontal lobe. MRI is recommended for further evaluation. Possible mildly dense distal left MCA artery. 2. No hemorrhage. 3. Atrophy Electronically Signed   By: Donavan Foil M.D.   On: 09/26/2016 21:04    EKG: Independently reviewed.  NSR with rate 95; nonspecific ST changes in the lateral leads with no evidence of acute ischemia  Assessment/Plan Principal Problem:   Stroke (cerebrum) ([facility]) Active Problems:   Hyperlipidemia   Essential hypertension   DM (diabetes mellitus) ([facility])   CKD (chronic kidney disease)   Elevated troponin   Anemia   CVA -Found down, significant expressive aphasia and likely other neurologic findings (hard to define because it is not clear how much he really understands and he is not fully able to follow directions) -CT is clearly abnormal and MRI/MRA confirm acute stroke -Will admit to SDU for further CVA evaluation -Telemetry monitoring -MRI/MRA complete, preliminary reading by Dr. Cheral Marker is in the  chart -Carotid dopplers -Echo with bubble study -Risk stratification with FLP, A1c; will also order UDS and TSH -ASA daily (unclear if patient has actually been taking this) -Due to concern for showers of emboli leading to the stroke, Dr. Cheral Marker recommends anticoagulation.  I spoke with him and we decided on treatment-dose Lovenox based on studies that indicate a possible increased risk of intracranial hemorrhage with a heparin drip but also with  elevated troponin (see below). -Consider transition to NOAC vs. Coumadin tomorrow. -We also discussed whether hypertonic saline was indicated due to the reading of edema on the CT; Dr. Cheral Marker is not concerned about this on the MRI and so recommends NS instead. -Consider repeat CT in 24-48 hours to assess for worsening edema. -Neuro consult, will need the stroke team -PT/OT/ST/nutrition/SW -He is not able to effectively name a NOK; his niece was in the room and did not feel comfortable assuming that role at this time.  SW assistance in this regard will be appreciated.   -He is also very likely to need at least short-term SNF for rehab if not for long-term placement. -All PO meds are held for now until patient has/passes bedside swallow evaluation.  HTN -Allow permissive HTN -Hold Lisinopril and plan to restart in 48-72 hours  [name] -Hold Lopid for now -He is very likely to need statin therapy -Will check FLP  DM -Roommate reported that patient uses some kind of insulin, but did not know what kind -Hold Ongylza -Excellent control at the time of admission -Cover with SSI -Check A1c  CKD -Creatinine 1.60 -Unknown baseline, but he has seen nephrology in the past -Will follow  Elevated troponin -Troponin 4.62, 7.03 -Uncertain etiology - possibly related to embolic source for CVA? 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  However, given the circumstances, the patient appears to be at much greater risk from not starting anticoagulation than from starting it.   DVT prophylaxis: Treatment-dose Lovenox Code Status: DNR - confirmed with patient/family.  Despite reservations about the patient's ability to communicate as well as the niece's reluctance to be his NOK, we discussed this issue as a group and the patient appeared to understand the question and appeared to be certain about his wishes.  The niece also related that the patient had made decisions for her father (his brother) in this circumstance in the past and seemed certain about his wishes to not be resuscitated. Family Communication: Niece present throughout evaluation Disposition Plan: Likely to need at least short-term SNF placement for rehab if not long-term Consults called: Neurology; consider cardiology in the AM  Admission status: Admit to SDU - It is my clinical opinion that admission to INPATIENT is reasonable and necessary because this patient will require at least 2 midnights in the hospital to treat this condition based on the medical complexity of the problems presented.  Given the aforementioned information, the predictability of an adverse outcome is felt to be significant.     Karmen Bongo MD [facility]  If 7PM-7AM, please contact night-coverage [email or web address] Password [email or web address]  09/27/2016, 12:22 AM

## 2016-09-26 NOTE — ED Notes (Signed)
Attempted to in and out cath patient x 2.  No urine return.

## 2016-09-26 NOTE — Consult Note (Signed)
NEURO HOSPITALIST CONSULT NOTE   Requestig physician: Dr. Estell HarpinZammit  Reason for Consult: Subacute left insular stroke  History obtained from:    Chart    HPI:                                                                                                                                          Douglas [name] is an 75 y.o. male who presents with aphasia and severe right sided weakness. His roommate found the patient on the floor at home. Last known well was today at 12:30 PM. Aphasia noted by EMS. CT head showed hypodensity in the left insular region and medial left temporal lobe, suspicious for subacute ischemic infarction. Increased density within an M2/proximal M3 branch of the left MCA was also seen on CT, suggestive of possible thrombosis. MRI and MRA of brain have been ordered. The patient is out of the IV tPA and 6 hour endovascular time windows.   His PMHx includes CKD, DM, [name] and HTN.  MRI brain reveals multiple subcentimeter foci of restricted diffusion at the cortical juxtacortical interfaces in multiple vascular territories, suspicious for shower emboli/cardioembolic strokes. Also seen is a moderate-size acute ischemic infarction involving the left insula and medial left temporal lobe. The above findings are consistent with completed strokes and there is no indication for CT perfusion study.    Past Medical History:  Diagnosis Date  . CKD (chronic kidney disease)   . DM (diabetes mellitus) ([facility])   . Hyperlipidemia   . Hypertension     History reviewed. No pertinent surgical history.  Family History  Problem Relation Age of Onset  . Heart disease Mother   . Cancer Father    Social History:  reports that he quit smoking about 34 years ago. He has never used smokeless tobacco. He reports that he drinks alcohol. He reports that he does not use drugs.  No Known Allergies  MEDICATIONS:                                                                                                                      Current Meds  Medication Sig  . NON FORMULARY PT IS ON SOME TYPE OF INSULIN PER ROOMMATE, UNSURE AT THIS TIME OF WHAT KIND  ROS:                                                                                                                                       Unable to obtain due to AMS.   Blood pressure 110/100, pulse 96, temperature 97.6 F (36.4 C), temperature source Oral, resp. rate 19, height 5\' 10"  (1.778 m), weight 72.6 kg (160 lb), SpO2 96 %.  General Examination:                                                                                                      HEENT-  Normocephalic/atraumatic.    Lungs- No gross wheezes. Respirations unlabored.  Extremities- No edema noted.   Neurological Examination Mental Status: Awake. Dense receptive and expressive dysphasia. Understands about 25% of simple commands, often requiring repetition.  Cranial Nerves: II: Blinks to threat in both visual fields. PERRL.  III,IV, VI: ptosis not present, saccades intact to left with some hesitation directing gaze to right. Does not follow commands for visual pursuits.  V,VII: Mild right facial droop. Reacts to fine touch bilaterally.   VIII: hearing intact to commands IX,X: mild hypophonia XI: no gross asymmetry XII: midline tongue extension Motor:  Right : Upper extremity   4/5    Left:     Upper extremity   5/5  Lower extremity   5/5     Lower extremity   5/5 Sensory: Reacts to noxious in all 4 extremities, less so to RUE.  Deep Tendon Reflexes: 3+ biceps and brachioradialis bilaterally. 2+ right patellar, 4+ left patellar (crossed adductor), 1+ achilles bilaterally. Toes upgoing. and symmetric  Cerebellar: No gross ataxia. Does not follow commands for detailed testing.  Gait: Unable to assess   Lab Results: Basic Metabolic Panel:  Recent Labs Lab 09/26/16 1800 09/26/16 1834  NA 138 138  K 3.7 3.8  CL 105 105  CO2 21*  --    GLUCOSE 89 85  BUN 18 20  CREATININE 1.62* 1.60*  CALCIUM 9.4  --     Liver Function Tests:  Recent Labs Lab 09/26/16 1800  AST 31  ALT 15*  ALKPHOS 135*  BILITOT 1.1  PROT 6.7  ALBUMIN 3.3*   No results for input(s): LIPASE, AMYLASE in the last 168 hours. No results for input(s): AMMONIA in the last 168 hours.  CBC:  Recent Labs Lab 09/26/16 1834  HGB 10.2*  HCT 30.0*    Cardiac Enzymes: No results for input(s): CKTOTAL, CKMB, CKMBINDEX,  TROPONINI in the last 168 hours.  Lipid Panel: No results for input(s): CHOL, TRIG, HDL, CHOLHDL, VLDL, LDLCALC in the last 168 hours.  CBG: No results for input(s): GLUCAP in the last 168 hours.  Microbiology: No results found for this or any previous visit.  Coagulation Studies: No results for input(s): LABPROT, INR in the last 72 hours.  Imaging: Dg Chest 2 View  Result Date: 09/26/2016 CLINICAL DATA:  Altered mental status. EXAM: CHEST  2 VIEW COMPARISON:  None. FINDINGS: Mild cardiomegaly is noted. No pneumothorax or pleural effusion is noted. Left lung is clear. Ill-defined opacity is seen in the right lung concerning for possible pneumonia or edema. Bony thorax is unremarkable. IMPRESSION: Ill-defined opacity seen in right lung concerning for possible pneumonia or edema. Electronically Signed   By: Lupita RaiderJames  Green Jr, M.D.   On: 09/26/2016 19:08   Ct Head Wo Contrast  Result Date: 09/26/2016 CLINICAL DATA:  Found down on floor today.  Aphasia EXAM: CT HEAD WITHOUT CONTRAST TECHNIQUE: Contiguous axial images were obtained from the base of the skull through the vertex without intravenous contrast. COMPARISON:  None. FINDINGS: Brain: There is no acute hemorrhage. No focal mass lesion. Suspected loss of the gray-white matter differentiation involving the left insula, inferior frontal, and anterior temporal lobes. Moderate atrophy. Ventricles slightly enlarged but felt secondary to atrophy. Vascular: Carotid artery  calcifications. Questionable mildly dense distal left MCA. Skull: Mastoid air cells are clear. No fracture. Prominent mandibular condyles with degenerative changes. Sinuses/Orbits: No acute fluid levels in the sinuses. Mild mucosal thickening in the ethmoid sinuses. No acute orbital abnormality. Other: None IMPRESSION: 1. Suspect edema within the left insular cortex, anterior temporal lobe and left inferior frontal lobe. MRI is recommended for further evaluation. Possible mildly dense distal left MCA artery. 2. No hemorrhage. 3. Atrophy Electronically Signed   By: Jasmine PangKim  Fujinaga M.D.   On: 09/26/2016 21:04   Assessement: 1. Left insular and temporal lobe acute ischemic infarction secondary to left MCA branch occlusion. 2. Multifocal acute infarctions at the cortical/juxtacortical interfaces, suspicious for shower emboli/cardioembolic strokes. 3. The above findings are consistent with completed strokes. There is no indication for a CT perfusion study.  4. Stroke risk factors include DM, HTN and [name].  5. Enhancement at two locations on MRI brain suspicious for possible septic emboli. Alternatively, the enhancement may represent non-septic regions of late subacute infarction from prior embolic events.  6. CKD. May need to renally dose Lovenox.  Recommendations: 1. TTE to assess for possible mural thrombus. If negative, obtain TEE.  2. Blood cultures and ID consult for possible SBE.  3. Anticoagulation given high suspicion for cardioembolic strokes. This diagnosis is felt to be significantly more likely than bacterial endocarditis with septic emboli, therefore no definite contraindication to anticoagulation.  4. ID consult for possible SBE.  5. Call pharmacy for possible renal dosing of Lovenox given CKD.  6. BP management.  7. PT/OT/Speech.   Electronically signed: Dr. Caryl PinaEric Avry [place] 09/26/2016, 10:43 PM

## 2016-09-26 NOTE — ED Provider Notes (Addendum)
[facility]-EMERGENCY DEPT Provider Note   CSN: [id number] Arrival date & time: 09/26/16  1726     History   Chief Complaint Chief Complaint  Patient presents with  . Altered Mental Status    HPI Douglas [name] is a 75 y.o. male.  Patient was found at home on the floor unable to move his right side unable to speak at approximately 5 PM tonight.   The history is provided by the EMS personnel. No language interpreter was used.  Altered Mental Status   This is a new problem. Episode onset: Unknown. The problem has not changed since onset.Associated symptoms include confusion. Risk factors: Hypertension diabetes. His past medical history does not include seizures.    Past Medical History:  Diagnosis Date  . CKD (chronic kidney disease)   . DM (diabetes mellitus) (HCC)   . Hyperlipidemia   . Hypertension     Patient Active Problem List   Diagnosis Date Noted  . 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 Hyperlipidemia 11/30/2013  . Essential hypertension 11/30/2013  . DM (diabetes mellitus) (HCC) 11/30/2013    History reviewed. No pertinent surgical history.     Home Medications    Prior to Admission medications   Medication Sig Start Date End Date Taking? Authorizing Provider  NON FORMULARY PT IS ON SOME TYPE OF INSULIN PER ROOMMATE, UNSURE AT THIS TIME OF WHAT KIND   Yes Historical Provider, MD  aspirin 81 MG tablet Take 81 mg by mouth daily.    Historical Provider, MD  cholecalciferol (VITAMIN D) 1000 UNITS tablet Take 1,000 Units by mouth daily.    Historical Provider, MD  colchicine (COLCRYS) 0.6 MG tablet Take 0.6 mg by mouth daily.    Historical Provider, MD  febuxostat (ULORIC) 40 MG tablet Take 40 mg by mouth daily.    Historical Provider, MD  ferrous sulfate 325 (65 FE) MG tablet Take 325 mg by mouth daily with breakfast.    Historical Provider, MD  gemfibrozil (LOPID) 600 MG tablet Take 600 mg by mouth 2 (two) times daily before a meal.    Historical Provider,  MD  levothyroxine (SYNTHROID, LEVOTHROID) 50 MCG tablet Take 50 mcg by mouth daily before breakfast.    Historical Provider, MD  lisinopril (PRINIVIL,ZESTRIL) 20 MG tablet Take 20 mg by mouth daily.    Historical Provider, MD  omeprazole (PRILOSEC) 20 MG capsule Take 20 mg by mouth daily.    Historical Provider, MD  saxagliptin HCl (ONGLYZA) 5 MG TABS tablet Take by mouth daily.    Historical Provider, MD    Family History Family History  Problem Relation Age of Onset  . Heart disease Mother   . Cancer Father     Social History Social History  Substance Use Topics  . Smoking status: Former Smoker    Quit date: 06/30/1982  . Smokeless tobacco: Never Used  . Alcohol use Yes     Comment: occasional     Allergies   Patient has no known allergies.   Review of Systems Review of Systems  Unable to perform ROS: Mental status change  Psychiatric/Behavioral: Positive for confusion.     Physical Exam Updated Vital Signs BP 110/100   Pulse 96   Temp 97.6 F (36.4 C) (Oral)   Resp 19   Ht 5\' 10"  (1.778 m)   Wt 160 lb (72.6 kg)   SpO2 96%   BMI 22.96 kg/m   Physical Exam  Constitutional: He appears well-developed.  HENT:  Head: Normocephalic.  Eyes: Conjunctivae and EOM are normal. No scleral icterus.  Neck: Neck supple. No thyromegaly present.  Cardiovascular: Normal rate and regular rhythm.  Exam reveals no gallop and no friction rub.   No murmur heard. Pulmonary/Chest: No stridor. He has no wheezes. He has no rales. He exhibits no tenderness.  Abdominal: He exhibits no distension. There is no tenderness. There is no rebound.  Musculoskeletal: He exhibits no edema.  Lymphadenopathy:    He has no cervical adenopathy.  Neurological: He exhibits normal muscle tone. Coordination normal.  Patient lethargic. He is unable to follow any commands or answer any questions. Patient has profound weakness in his right arm and right leg  Skin: No rash noted. No erythema.      ED Treatments / Results  Labs (all labs ordered are listed, but only abnormal results are displayed) Labs Reviewed  COMPREHENSIVE METABOLIC PANEL - Abnormal; Notable for the following:       Result Value   CO2 21 (*)    Creatinine, Ser 1.62 (*)    Albumin 3.3 (*)    ALT 15 (*)    Alkaline Phosphatase 135 (*)    GFR calc non Af Amer 40 (*)    GFR calc Af Amer 46 (*)    All other components within normal limits  I-STAT TROPOININ, ED - Abnormal; Notable for the following:    Troponin i, poc 0.23 (*)    All other components within normal limits  I-STAT CHEM 8, ED - Abnormal; Notable for the following:    Creatinine, Ser 1.60 (*)    Hemoglobin 10.2 (*)    HCT 30.0 (*)    All other components within normal limits  I-STAT TROPOININ, ED - Abnormal; Notable for the following:    Troponin i, poc 0.27 (*)    All other components within normal limits  BRAIN NATRIURETIC PEPTIDE  URINALYSIS, ROUTINE W REFLEX MICROSCOPIC  RAPID URINE DRUG SCREEN, HOSP PERFORMED  I-STAT CG4 LACTIC ACID, ED  I-STAT CG4 LACTIC ACID, ED    EKG  EKG Interpretation  Date/Time:  Tuesday September 26 2016 17:51:59 EST Ventricular Rate:  95 PR Interval:    QRS Duration: 96 QT Interval:  355 QTC Calculation: 447 R Axis:   3 Text Interpretation:  Sinus rhythm Low voltage, precordial leads Nonspecific T abnormalities, lateral leads Confirmed by Cyan Clippinger  MD, Margie Urbanowicz [phone]) on 09/26/2016 6:45:07 PM       Radiology Dg Chest 2 View  Result Date: 09/26/2016 CLINICAL DATA:  Altered mental status. EXAM: CHEST  2 VIEW COMPARISON:  None. FINDINGS: Mild cardiomegaly is noted. No pneumothorax or pleural effusion is noted. Left lung is clear. Ill-defined opacity is seen in the right lung concerning for possible pneumonia or edema. Bony thorax is unremarkable. IMPRESSION: Ill-defined opacity seen in right lung concerning for possible pneumonia or edema. Electronically Signed   By: Lupita Raider, M.D.   On:  09/26/2016 19:08   Ct Head Wo Contrast  Result Date: 09/26/2016 CLINICAL DATA:  Found down on floor today.  Aphasia EXAM: CT HEAD WITHOUT CONTRAST TECHNIQUE: Contiguous axial images were obtained from the base of the skull through the vertex without intravenous contrast. COMPARISON:  None. FINDINGS: Brain: There is no acute hemorrhage. No focal mass lesion. Suspected loss of the gray-white matter differentiation involving the left insula, inferior frontal, and anterior temporal lobes. Moderate atrophy. Ventricles slightly enlarged but felt secondary to atrophy. Vascular: Carotid artery calcifications. Questionable mildly dense distal left MCA. Skull: Mastoid air  cells are clear. No fracture. Prominent mandibular condyles with degenerative changes. Sinuses/Orbits: No acute fluid levels in the sinuses. Mild mucosal thickening in the ethmoid sinuses. No acute orbital abnormality. Other: None IMPRESSION: 1. Suspect edema within the left insular cortex, anterior temporal lobe and left inferior frontal lobe. MRI is recommended for further evaluation. Possible mildly dense distal left MCA artery. 2. No hemorrhage. 3. Atrophy Electronically Signed   By: Jasmine PangKim  Fujinaga M.D.   On: 09/26/2016 21:04    Procedures Procedures (including critical care time)  Medications Ordered in ED Medications  sodium chloride 0.9 % bolus 500 mL (500 mLs Intravenous New Bag/Given 09/26/16 1826)  naloxone [facility](NARCAN) injection 0.4 mg (0.4 mg Intravenous Given 09/26/16 1751)     Initial Impression / Assessment and Plan / ED Course  I have reviewed the triage vital signs and the nursing notes.  Pertinent labs & imaging results that were available during my care of the patient were reviewed by me and considered in my medical decision making (see chart for details).  Clinical Course     The patient returned from CT scan he was able to speak but was confused. He had slurred speech and right facial drooping his right arm right leg  are much stronger almost back to normal. He was able to answer yes or no to questions and follow commands additional history that was learned later was patient was normal at 12:30 PM today and was found at 5 PM and arrived at the emergency department at 5:30 PM CT scan suggested left MCA stroke and MRI was recommended. Neurology was consulted and patient will be admitted to stepdown by medicine CRITICAL CARE Performed by: Kenyatta Keidel L Total critical care time: 40minutes Critical care time was exclusive of separately billable procedures and treating other patients. Critical care was necessary to treat or prevent imminent or life-threatening deterioration. Critical care was time spent personally by me on the following activities: development of treatment plan with patient and/or surrogate as well as nursing, discussions with consultants, evaluation of patient's response to treatment, examination of patient, obtaining history from patient or surrogate, ordering and performing treatments and interventions, ordering and review of laboratory studies, ordering and review of radiographic studies, pulse oximetry and re-evaluation of patient's condition.  Final Clinical Impressions(s) / ED Diagnoses   Final diagnoses:  Acute embolic stroke [facility](HCC)    New Prescriptions New Prescriptions   No medications on file     Bethann BerkshireJoseph Krew Hortman, MD 09/26/16 2322    Bethann BerkshireJoseph Cedricka Sackrider, MD 09/26/16 [phone]

## 2016-09-26 NOTE — ED Triage Notes (Signed)
Gentleman who lives with pt. Found pt on the floor at home last known well time last night at 2030, aphasia noted as per EMS

## 2016-09-26 NOTE — ED Notes (Signed)
Called lab to add on BNP. ?

## 2016-09-27 ENCOUNTER — Inpatient Hospital Stay (HOSPITAL_COMMUNITY): Payer: Medicare Other

## 2016-09-27 ENCOUNTER — Encounter (HOSPITAL_COMMUNITY): Payer: Medicare Other

## 2016-09-27 DIAGNOSIS — I63039 Cerebral infarction due to thrombosis of unspecified carotid artery: Secondary | ICD-10-CM

## 2016-09-27 DIAGNOSIS — Z794 Long term (current) use of insulin: Secondary | ICD-10-CM

## 2016-09-27 DIAGNOSIS — R338 Other retention of urine: Secondary | ICD-10-CM

## 2016-09-27 DIAGNOSIS — R748 Abnormal levels of other serum enzymes: Secondary | ICD-10-CM

## 2016-09-27 DIAGNOSIS — R000 Tachycardia, unspecified: Secondary | ICD-10-CM

## 2016-09-27 DIAGNOSIS — E784 Other hyperlipidemia: Secondary | ICD-10-CM

## 2016-09-27 DIAGNOSIS — I5021 Acute systolic (congestive) heart failure: Secondary | ICD-10-CM | POA: Insufficient documentation

## 2016-09-27 DIAGNOSIS — E785 Hyperlipidemia, unspecified: Secondary | ICD-10-CM

## 2016-09-27 DIAGNOSIS — I63412 Cerebral infarction due to embolism of left middle cerebral artery: Principal | ICD-10-CM

## 2016-09-27 DIAGNOSIS — I10 Essential (primary) hypertension: Secondary | ICD-10-CM

## 2016-09-27 DIAGNOSIS — I6789 Other cerebrovascular disease: Secondary | ICD-10-CM

## 2016-09-27 DIAGNOSIS — N183 Chronic kidney disease, stage 3 (moderate): Secondary | ICD-10-CM

## 2016-09-27 DIAGNOSIS — R7989 Other specified abnormal findings of blood chemistry: Secondary | ICD-10-CM

## 2016-09-27 DIAGNOSIS — I639 Cerebral infarction, unspecified: Secondary | ICD-10-CM

## 2016-09-27 DIAGNOSIS — D649 Anemia, unspecified: Secondary | ICD-10-CM | POA: Diagnosis present

## 2016-09-27 DIAGNOSIS — N189 Chronic kidney disease, unspecified: Secondary | ICD-10-CM | POA: Diagnosis present

## 2016-09-27 DIAGNOSIS — R778 Other specified abnormalities of plasma proteins: Secondary | ICD-10-CM | POA: Diagnosis present

## 2016-09-27 DIAGNOSIS — E11649 Type 2 diabetes mellitus with hypoglycemia without coma: Secondary | ICD-10-CM

## 2016-09-27 DIAGNOSIS — E1159 Type 2 diabetes mellitus with other circulatory complications: Secondary | ICD-10-CM

## 2016-09-27 LAB — GLUCOSE, CAPILLARY
GLUCOSE-CAPILLARY: 38 mg/dL — AB (ref 65–99)
GLUCOSE-CAPILLARY: 62 mg/dL — AB (ref 65–99)
GLUCOSE-CAPILLARY: 91 mg/dL (ref 65–99)
GLUCOSE-CAPILLARY: 218 mg/dL — AB (ref 65–99)
Glucose-Capillary: 152 mg/dL — ABNORMAL HIGH (ref 65–99)
Glucose-Capillary: 109 mg/dL — ABNORMAL HIGH (ref 65–99)
Glucose-Capillary: 133 mg/dL — ABNORMAL HIGH (ref 65–99)

## 2016-09-27 LAB — LIPID PANEL
CHOLESTEROL: 173 mg/dL (ref 0–200)
HDL: 33 mg/dL — ABNORMAL LOW (ref >40)
LDL CALC: 120 mg/dL — AB (ref 0–99)
Total CHOL/HDL Ratio: 5.2 RATIO
Triglycerides: 102 mg/dL (ref <150)
VLDL: 20 mg/dL (ref 0–40)

## 2016-09-27 LAB — TROPONIN I
TROPONIN I: 0.62 ng/mL — AB (ref <0.03)
TROPONIN I: 0.64 ng/mL — AB (ref <0.03)
Troponin I: 0.54 ng/mL (ref <0.03)

## 2016-09-27 LAB — MRSA PCR SCREENING: MRSA by PCR: NEGATIVE

## 2016-09-27 MED ORDER — DEXTROSE-NACL 5-0.9 % IV SOLN: INTRAVENOUS | Status: DC

## 2016-09-27 MED ORDER — ASPIRIN 325 MG PO TABS
325.0000 mg | ORAL_TABLET | Freq: Every day | ORAL | Status: DC
  Administered 2016-09-28 – 2016-10-05 (×7): 325 mg via ORAL
  Filled 2016-09-27 (×8): qty 1

## 2016-09-27 MED ORDER — ENOXAPARIN SODIUM 40 MG/0.4ML ~~LOC~~ SOLN: 40.0000 mg | SUBCUTANEOUS | Status: DC

## 2016-09-27 MED ORDER — SODIUM CHLORIDE 0.9 % IV SOLN
INTRAVENOUS | Status: DC
  Administered 2016-09-27: 02:00:00 via INTRAVENOUS

## 2016-09-27 MED ORDER — ACETAMINOPHEN 160 MG/5ML PO SOLN: 650.0000 mg | ORAL | Status: DC | PRN

## 2016-09-27 MED ORDER — ACETAMINOPHEN 650 MG RE SUPP
650.0000 mg | RECTAL | Status: DC | PRN
  Administered 2016-09-28: 650 mg via RECTAL
  Filled 2016-09-27: qty 1

## 2016-09-27 MED ORDER — DEXTROSE 50 % IV SOLN
25.0000 mL | Freq: Once | INTRAVENOUS | Status: AC
  Administered 2016-09-27: 25 mL via INTRAVENOUS

## 2016-09-27 MED ORDER — DEXTROSE 50 % IV SOLN
INTRAVENOUS | Status: AC
  Administered 2016-09-27: 50 mL
  Filled 2016-09-27: qty 50

## 2016-09-27 MED ORDER — ASPIRIN 300 MG RE SUPP
300.0000 mg | Freq: Every day | RECTAL | Status: DC
  Administered 2016-09-27: 300 mg via RECTAL
  Filled 2016-09-27 (×2): qty 1

## 2016-09-27 MED ORDER — INSULIN ASPART 100 UNIT/ML ~~LOC~~ SOLN
0.0000 [IU] | Freq: Three times a day (TID) | SUBCUTANEOUS | Status: DC
Start: 2016-09-27 — End: 2016-09-27

## 2016-09-27 MED ORDER — STROKE: EARLY STAGES OF RECOVERY BOOK
Freq: Once | Status: AC
  Administered 2016-09-27: 14:00:00
  Filled 2016-09-27: qty 1

## 2016-09-27 MED ORDER — SENNOSIDES-DOCUSATE SODIUM 8.6-50 MG PO TABS: 1.0000 | ORAL_TABLET | Freq: Every evening | ORAL | Status: DC | PRN

## 2016-09-27 MED ORDER — SODIUM CHLORIDE 0.9 % IV SOLN: INTRAVENOUS | Status: DC

## 2016-09-27 MED ORDER — RESOURCE THICKENUP CLEAR PO POWD
ORAL | Status: DC | PRN
  Filled 2016-09-27 (×2): qty 125

## 2016-09-27 MED ORDER — DEXTROSE-NACL 5-0.9 % IV SOLN
INTRAVENOUS | Status: DC
  Administered 2016-09-27: 11:00:00 via INTRAVENOUS

## 2016-09-27 MED ORDER — ACETAMINOPHEN 325 MG PO TABS: 650.0000 mg | ORAL_TABLET | ORAL | Status: DC | PRN

## 2016-09-27 MED ORDER — DEXTROSE 50 % IV SOLN
INTRAVENOUS | Status: AC
Start: 2016-09-27 — End: 2016-09-27
  Filled 2016-09-27: qty 50

## 2016-09-27 MED ORDER — ENOXAPARIN SODIUM 40 MG/0.4ML ~~LOC~~ SOLN
40.0000 mg | SUBCUTANEOUS | Status: DC
  Administered 2016-09-28: 40 mg via SUBCUTANEOUS
  Filled 2016-09-27: qty 0.4

## 2016-09-27 MED ORDER — ENOXAPARIN SODIUM 80 MG/0.8ML ~~LOC~~ SOLN
70.0000 mg | Freq: Two times a day (BID) | SUBCUTANEOUS | Status: DC
  Administered 2016-09-27: 02:00:00 70 mg via SUBCUTANEOUS
  Filled 2016-09-27 (×3): qty 0.8

## 2016-09-27 NOTE — Evaluation (Signed)
Occupational Therapy Evaluation Patient Details Name: HORACE LUKAS MRN: [id number] DOB: 11/08/1940 Today's Date: 09/27/2016    History of Present Illness Rich Paprocki Aydeletteis a 75 y.o.malewith medical history significant of HTN, [name], DM, [name] presenting with a stroke. Pt with noted R sided weakness and expressive aphasia. 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   Clinical Impression   Pt admitted with the above diagnosis and has the deficits listed below. Pt would benefit from cont OT to increase independence with basic adls and adl transfers to eventually be able to return living at home with roommate.  At this time, roommate works and all nieces and nephews in area also work.  If pt has other family members available, pt would be an excellent rehab candidate.  Feel pt will need min assist to S even after a rehab stay so assist will be necessary.  Evaluation limited today due to HR up with activity and pt with troponin levels being retested.      Follow Up Recommendations  CIR;Supervision/Assistance - 24 hour    Equipment Recommendations  Other (comment) (tBD)    Recommendations for Other Services Rehab consult     Precautions / Restrictions Precautions Precautions: Fall Precaution Comments: R sided inattention Restrictions Weight Bearing Restrictions: No      Mobility Bed Mobility Overal bed mobility: Needs Assistance Bed Mobility: Rolling;Sidelying to Sit;Sit to Supine Rolling: Min assist Sidelying to sit: Mod assist;+2 for physical assistance   Sit to supine: Mod assist;+2 for physical assistance   General bed mobility comments: max directional verbal and tactile cues to complete task. pt initiated with bringing LEs off EOB but required modA to complete, modA for trunk management in/out of bed  Transfers Overall transfer level: Needs assistance Equipment used: 2 person hand held assist Transfers: Sit to/from Stand Sit to  Stand: Min assist;+2 physical assistance         General transfer comment: pt initiated powering up but once standing required mod/maxAx2 due to strong R lateral knee and locking out of R knee. Pt HR increased into 130s, pt returned to sitting EOB    Balance Overall balance assessment: Needs assistance Sitting-balance support: Feet supported;Bilateral upper extremity supported Sitting balance-Leahy Scale: Poor Sitting balance - Comments: pt with strong lateral lean to the R, maxA to maintain midline position. pt unable to sense midline or self correct   Standing balance support: Bilateral upper extremity supported Standing balance-Leahy Scale: Poor Standing balance comment: pt with lean to the Right                            ADL Overall ADL's : Needs assistance/impaired Eating/Feeding: Total assistance Eating/Feeding Details (indicate cue type and reason): Pt very lethargic Grooming: Oral care;Wash/dry face;Maximal assistance;Sitting;Cueing for compensatory techniques Grooming Details (indicate cue type and reason): hand over hand assist given for washing face.  Pt put toothbrush in mouth but unable to prepare brush or actually brush teeth. Upper Body Bathing: Total assistance   Lower Body Bathing: Total assistance   Upper Body Dressing : Total assistance   Lower Body Dressing: Total assistance   Toilet Transfer: +2 for physical assistance;Maximal assistance;Stand-pivot;BSC   Toileting- Clothing Manipulation and Hygiene: Total assistance;+2 for physical assistance;Sit to/from stand       Functional mobility during ADLs: +2 for physical assistance;Maximal assistance General ADL Comments: Pt dependent for most adls at this time.  Pt very lethargic, difficult to  keep awake and HR rising with activity.  Given pt's troponin level, pt was returned to bed.     Vision Additional Comments: needs to be further evaluated.   Perception Perception Perception Tested?: No    Praxis Praxis Praxis tested?:  (pt used toothbrush and wash cloth appropriately. ) Praxis-Other Comments: Further testing needed.    Pertinent Vitals/Pain Pain Assessment: No/denies pain Faces Pain Scale: No hurt     Hand Dominance Right   Extremity/Trunk Assessment Upper Extremity Assessment Upper Extremity Assessment: RUE deficits/detail RUE Deficits / Details: Some spontaneous movement noted but nothing functional.  Pt with significant R inattn. RUE Coordination: decreased fine motor;decreased gross motor   Lower Extremity Assessment Lower Extremity Assessment: Defer to PT evaluation RLE Deficits / Details: pt with decreased R LE voluntary mvmt but was able to stand without LE buckling. Anticipate pt to have coordination deficits. unable to complete MMT due to impaired cognition   Cervical / Trunk Assessment Cervical / Trunk Assessment: Normal   Communication Communication Communication: Receptive difficulties;Expressive difficulties   Cognition Arousal/Alertness: Lethargic Behavior During Therapy: Flat affect Overall Cognitive Status: Impaired/Different from baseline Area of Impairment: Following commands;Safety/judgement;Awareness;Problem solving;Attention   Current Attention Level: Focused   Following Commands: Follows one step commands with increased time;Follows one step commands inconsistently Safety/Judgement: Decreased awareness of safety;Decreased awareness of deficits Awareness: Intellectual Problem Solving: Slow processing;Decreased initiation;Difficulty sequencing;Requires verbal cues;Requires tactile cues General Comments: pt very lethargic but did become more alert s/p 15 min of working with therapy. Pt extremely delayed processing but did verbalize yes/no questions 25% of time   General Comments       Exercises       Shoulder Instructions      Home Living Family/patient expects to be discharged to:: Inpatient rehab                                  Additional Comments: pt lived with a roomate who works during the day. 1 story home with 3-4 steps to enter.       Prior Functioning/Environment Level of Independence: Independent        Comments: pt didn't use AD, was driving, cooking, bathing, dressing all independently.         OT Problem List: Decreased strength;Decreased activity tolerance;Decreased range of motion;Impaired balance (sitting and/or standing);Decreased coordination;Decreased cognition;Decreased safety awareness;Decreased knowledge of use of DME or AE;Decreased knowledge of precautions;Cardiopulmonary status limiting activity;Impaired UE functional use   OT Treatment/Interventions: Self-care/ADL training;DME and/or AE instruction;Therapeutic activities;Cognitive remediation/compensation;Balance training    OT Goals(Current goals can be found in the care plan section) Acute Rehab OT Goals Patient Stated Goal: didn't state OT Goal Formulation: With patient/family Time For Goal Achievement: 10/11/16 Potential to Achieve Goals: Fair ADL Goals Pt Will Perform Eating: with min assist;sitting Pt Will Perform Grooming: with min assist;sitting Pt Will Transfer to Toilet: with min assist;bedside commode;stand pivot transfer Additional ADL Goal #1: Pt will maintain balance on EOB for 5 min during grooming task with S.  OT Frequency: Min 2X/week   Barriers to D/C: Decreased caregiver support  Lives with roommate. Nieces and nephews in area that all work as well.       Co-evaluation PT/OT/SLP Co-Evaluation/Treatment: Yes Reason for Co-Treatment: Complexity of the patient's impairments (multi-system involvement);To address functional/ADL transfers;Necessary to address cognition/behavior during functional activity PT goals addressed during session: Mobility/safety with mobility OT goals addressed during session: ADL's and self-care  End of Session Nurse Communication: Mobility status;Other (comment)  (HR up during activity to 135)  Activity Tolerance: Patient limited by lethargy Patient left: in bed;with family/visitor present;with call bell/phone within reach   Time: [id number] OT Time Calculation (min): 34 min Charges:  OT General Charges $OT Visit: 1 Procedure OT Evaluation $OT Eval Moderate Complexity: 1 Procedure G-Codes:    Hope BuddsJones, Kyler Germer Anne 09/27/2016, 12:12 PM  [phone]

## 2016-09-27 NOTE — Progress Notes (Signed)
STROKE TEAM PROGRESS NOTE   HISTORY OF PRESENT ILLNESS (per record) Osborn CohoJames L Elvin is an 75 y.o. male who presents with aphasia and severe right sided weakness. His roommate found the patient on the floor at home. Last known well was today 09/27/2016 at 12:30 PM. Aphasia noted by EMS. CT head showed hypodensity in the left insular region and medial left temporal lobe, suspicious for subacute ischemic infarction. Increased density within an M2/proximal M3 branch of the left MCA was also seen on CT, suggestive of possible thrombosis. MRI and MRA of brain have been ordered. The patient is out of the IV tPA and 6 hour endovascular time windows.   His PMHx includes CKD, DM, [name] and HTN.  MRI brain reveals multiple subcentimeter foci of restricted diffusion at the cortical juxtacortical interfaces in multiple vascular territories, suspicious for shower emboli/cardioembolic strokes. Also seen is a moderate-size acute ischemic infarction involving the left insula and medial left temporal lobe. The above findings are consistent with completed strokes and there is no indication for CT perfusion study. He was admitted to the ICU stepdown unit for further evaluation and treatment.   SUBJECTIVE (INTERVAL HISTORY) His niece,who is his next of kin is at the bedside. He was never married and has 0 children. She is looking for his will, DNR paperwork. Patient made himself a DO NOT RESUSCITATE on admission.  Nurses working with him - trying to catheterize - bladder full, > 700cc. unable to penetrate bladder due to foreskin. Per family - [facility], pt stated he didn't feel well.    OBJECTIVE Temp:  [97.6 F (36.4 C)-100.1 F (37.8 C)] 99.4 F (37.4 C) (12/13 0700) Pulse Rate:  [87-98] 87 (12/13 0700) Cardiac Rhythm: Normal sinus rhythm (12/13 0700) Resp:  [17-20] 19 (12/13 0700) BP: (106-145)/(55-100) 145/64 (12/13 0700) SpO2:  [93 %-99 %] 98 % (12/13  0700) Weight:  [72.3 kg (159 lb 4.8 oz)-72.6 kg (160 lb)] 72.3 kg (159 lb 4.8 oz) (12/13 0054)  CBC:  Recent Labs Lab 09/26/16 1834  HGB 10.2*  HCT 30.0*    Basic Metabolic Panel:  Recent Labs Lab 09/26/16 1800 09/26/16 1834  NA 138 138  K 3.7 3.8  CL 105 105  CO2 21*  --   GLUCOSE 89 85  BUN 18 20  CREATININE 1.62* 1.60*  CALCIUM 9.4  --     Lipid Panel:    Component Value Date/Time   CHOL 173 09/27/2016 0317   TRIG 102 09/27/2016 0317   HDL 33 (L) 09/27/2016 0317   CHOLHDL 5.2 09/27/2016 0317   VLDL 20 09/27/2016 0317   LDLCALC 120 (H) 09/27/2016 0317   HgbA1c: No results found for: HGBA1C Urine Drug Screen: No results found for: LABOPIA, COCAINSCRNUR, LABBENZ, AMPHETMU, THCU, LABBARB    IMAGING  Dg Chest 2 View  Result Date: 09/26/2016 CLINICAL DATA:  Altered mental status. EXAM: CHEST  2 VIEW COMPARISON:  None. FINDINGS: Mild cardiomegaly is noted. No pneumothorax or pleural effusion is noted. Left lung is clear. Ill-defined opacity is seen in the right lung concerning for possible pneumonia or edema. Bony thorax is unremarkable. IMPRESSION: Ill-defined opacity seen in right lung concerning for possible pneumonia or edema. Electronically Signed   By: Lupita RaiderJames  Green Jr, M.D.   On: 09/26/2016 19:08   Ct Head Wo Contrast  Result Date: 09/26/2016 CLINICAL DATA:  Found down on floor today.  Aphasia EXAM: CT HEAD WITHOUT CONTRAST TECHNIQUE: Contiguous axial  images were obtained from the base of the skull through the vertex without intravenous contrast. COMPARISON:  None. FINDINGS: Brain: There is no acute hemorrhage. No focal mass lesion. Suspected loss of the gray-white matter differentiation involving the left insula, inferior frontal, and anterior temporal lobes. Moderate atrophy. Ventricles slightly enlarged but felt secondary to atrophy. Vascular: Carotid artery calcifications. Questionable mildly dense distal left MCA. Skull: Mastoid air cells are clear. No  fracture. Prominent mandibular condyles with degenerative changes. Sinuses/Orbits: No acute fluid levels in the sinuses. Mild mucosal thickening in the ethmoid sinuses. No acute orbital abnormality. Other: None IMPRESSION: 1. Suspect edema within the left insular cortex, anterior temporal lobe and left inferior frontal lobe. MRI is recommended for further evaluation. Possible mildly dense distal left MCA artery. 2. No hemorrhage. 3. Atrophy Electronically Signed   By: Jasmine Pang M.D.   On: 09/26/2016 21:04   Mr Angiogram Head Wo Contrast  Result Date: 09/27/2016 CLINICAL DATA:  Right-sided paralysis and aphasia. EXAM: MRI HEAD WITHOUT AND WITH CONTRAST MRA HEAD WITHOUT CONTRAST MRA NECK WITHOUT AND WITH CONTRAST TECHNIQUE: Multiplanar, multiecho pulse sequences of the brain and surrounding structures were obtained without and with intravenous contrast. Angiographic images of the Circle of Willis were obtained using MRA technique without intravenous contrast. Angiographic images of the neck were obtained using MRA technique without and with intravenous contrast. Carotid stenosis measurements (when applicable) are obtained utilizing NASCET criteria, using the distal internal carotid diameter as the denominator. CONTRAST:  15 mL MultiHance IV COMPARISON:  Head CT 09/26/2016 FINDINGS: MRI HEAD FINDINGS Brain: The midline structures are normal. There is extensive diffusion restriction in the left insular cortex and frontal operculum. There are additional small foci of diffusion restriction scattered throughout both cerebral hemispheres and both sides of the cerebellum. There is no midline shift or significant mass effect. No evidence of acute hemorrhage. There is multifocal hyperintense T2-weighted signal within the periventricular white matter, most often seen in the setting of chronic microvascular ischemia. No hydrocephalus or extra-axial fluid collection. No lobar predominant atrophy pattern. There are areas  of contrast enhancement within the cerebellum. Within the peripheral aspect of the left hemisphere, in the area of peripheral contrast enhancement measures 1.9 x 1.1 cm. A smaller focus near the cerebellar midline measures 4 mm. There is an additional focus of contrast enhancement along the right precentral gyrus. Vascular: Major intracranial arterial and venous sinus flow voids are preserved. Area of hemosiderin deposition in the left cerebellar hemisphere. There is focal susceptibility in the location of the left MCA bifurcation, which corresponds to the occlusion demonstrated on time-of-flight images. Skull and upper cervical spine: The visualized skull base, calvarium, upper cervical spine and extracranial soft tissues are normal. Sinuses/Orbits: No fluid levels or advanced mucosal thickening. No mastoid effusion. Normal orbits. MRA HEAD FINDINGS Intracranial internal carotid arteries: Normal. Anterior cerebral arteries: Normal. Middle cerebral arteries: There is occlusion of the left middle cerebral artery at the site of the bifurcation. There is minimal flow related enhancement seen within the distal left MCA distribution. The right MCA is normal. Posterior communicating arteries: Present bilaterally. Posterior cerebral arteries: Normal. Basilar artery: Normal. Vertebral arteries: Codominant. Normal. Superior cerebellar arteries: Normal. Anterior inferior cerebellar arteries: Normal. Posterior inferior cerebellar arteries: Normal. MRA NECK FINDINGS There is a normal 3 vessel aortic arch branch pattern. The bilateral subclavian arteries are normal. There is approximately 30% stenosis at the proximal left internal carotid artery. No right carotid stenosis. The cervical courses of both vertebral arteries are normal.  IMPRESSION: 1. 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 Multiple other punctate foci of acute ischemia scattered throughout both cerebral and cerebellar hemispheres  suggest a central embolic process. No hemorrhage or mass effect. 2. 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 3. Multiple foci of contrast enhancement within the cerebellum and right frontal lobe, with the largest focus measuring up to 2 cm. Given the other findings, these findings may be secondary to subacute ischemia. However, follow-up imaging (MRI brain with and without contrast) is recommended in 4-6 weeks to exclude the possibility of a neoplastic process, primarily metastatic disease. 4. 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 Otherwise normal MRA of the neck. Critical Value/emergent results were called by telephone at the time of interpretation on 09/27/2016 at 12:08 am to [facility] who verbally acknowledged these results. Electronically Signed   By: Deatra RobinsonKevin  Herman M.D.   On: 09/27/2016 00:18   Mr Angiogram Neck W Or Wo Contrast  Result Date: 09/27/2016 CLINICAL DATA:  Right-sided paralysis and aphasia. EXAM: MRI HEAD WITHOUT AND WITH CONTRAST MRA HEAD WITHOUT CONTRAST MRA NECK WITHOUT AND WITH CONTRAST TECHNIQUE: Multiplanar, multiecho pulse sequences of the brain and surrounding structures were obtained without and with intravenous contrast. Angiographic images of the Circle of Willis were obtained using MRA technique without intravenous contrast. Angiographic images of the neck were obtained using MRA technique without and with intravenous contrast. Carotid stenosis measurements (when applicable) are obtained utilizing NASCET criteria, using the distal internal carotid diameter as the denominator. CONTRAST:  15 mL MultiHance IV COMPARISON:  Head CT 09/26/2016 FINDINGS: MRI HEAD FINDINGS Brain: The midline structures are normal. There is extensive diffusion restriction in the left insular cortex and frontal operculum. There are additional small foci of diffusion restriction scattered throughout both cerebral hemispheres and both sides of the  cerebellum. There is no midline shift or significant mass effect. No evidence of acute hemorrhage. There is multifocal hyperintense T2-weighted signal within the periventricular white matter, most often seen in the setting of chronic microvascular ischemia. No hydrocephalus or extra-axial fluid collection. No lobar predominant atrophy pattern. There are areas of contrast enhancement within the cerebellum. Within the peripheral aspect of the left hemisphere, in the area of peripheral contrast enhancement measures 1.9 x 1.1 cm. A smaller focus near the cerebellar midline measures 4 mm. There is an additional focus of contrast enhancement along the right precentral gyrus. Vascular: Major intracranial arterial and venous sinus flow voids are preserved. Area of hemosiderin deposition in the left cerebellar hemisphere. There is focal susceptibility in the location of the left MCA bifurcation, which corresponds to the occlusion demonstrated on time-of-flight images. Skull and upper cervical spine: The visualized skull base, calvarium, upper cervical spine and extracranial soft tissues are normal. Sinuses/Orbits: No fluid levels or advanced mucosal thickening. No mastoid effusion. Normal orbits. MRA HEAD FINDINGS Intracranial internal carotid arteries: Normal. Anterior cerebral arteries: Normal. Middle cerebral arteries: There is occlusion of the left middle cerebral artery at the site of the bifurcation. There is minimal flow related enhancement seen within the distal left MCA distribution. The right MCA is normal. Posterior communicating arteries: Present bilaterally. Posterior cerebral arteries: Normal. Basilar artery: Normal. Vertebral arteries: Codominant. Normal. Superior cerebellar arteries: Normal. Anterior inferior cerebellar arteries: Normal. Posterior inferior cerebellar arteries: Normal. MRA NECK FINDINGS There is a normal 3 vessel aortic arch branch pattern. The bilateral subclavian arteries are normal. There  is approximately 30% stenosis at the  proximal left internal carotid artery. No right carotid stenosis. The cervical courses of both vertebral arteries are normal. IMPRESSION: 1. 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 Multiple other punctate foci of acute ischemia scattered throughout both cerebral and cerebellar hemispheres suggest a central embolic process. No hemorrhage or mass effect. 2. 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 3. Multiple foci of contrast enhancement within the cerebellum and right frontal lobe, with the largest focus measuring up to 2 cm. Given the other findings, these findings may be secondary to subacute ischemia. However, follow-up imaging (MRI brain with and without contrast) is recommended in 4-6 weeks to exclude the possibility of a neoplastic process, primarily metastatic disease. 4. 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 Otherwise normal MRA of the neck. Critical Value/emergent results were called by telephone at the time of interpretation on 09/27/2016 at 12:08 am to [facility], [place] who verbally acknowledged these results. Electronically Signed   By: Deatra Robinson M.D.   On: 09/27/2016 00:18   Mr Laqueta Jean WU Contrast  Result Date: 09/27/2016 CLINICAL DATA:  Right-sided paralysis and aphasia. EXAM: MRI HEAD WITHOUT AND WITH CONTRAST MRA HEAD WITHOUT CONTRAST MRA NECK WITHOUT AND WITH CONTRAST TECHNIQUE: Multiplanar, multiecho pulse sequences of the brain and surrounding structures were obtained without and with intravenous contrast. Angiographic images of the Circle of Willis were obtained using MRA technique without intravenous contrast. Angiographic images of the neck were obtained using MRA technique without and with intravenous contrast. Carotid stenosis measurements (when applicable) are obtained utilizing NASCET criteria, using the distal internal carotid diameter as the denominator.  CONTRAST:  15 mL MultiHance IV COMPARISON:  Head CT 09/26/2016 FINDINGS: MRI HEAD FINDINGS Brain: The midline structures are normal. There is extensive diffusion restriction in the left insular cortex and frontal operculum. There are additional small foci of diffusion restriction scattered throughout both cerebral hemispheres and both sides of the cerebellum. There is no midline shift or significant mass effect. No evidence of acute hemorrhage. There is multifocal hyperintense T2-weighted signal within the periventricular white matter, most often seen in the setting of chronic microvascular ischemia. No hydrocephalus or extra-axial fluid collection. No lobar predominant atrophy pattern. There are areas of contrast enhancement within the cerebellum. Within the peripheral aspect of the left hemisphere, in the area of peripheral contrast enhancement measures 1.9 x 1.1 cm. A smaller focus near the cerebellar midline measures 4 mm. There is an additional focus of contrast enhancement along the right precentral gyrus. Vascular: Major intracranial arterial and venous sinus flow voids are preserved. Area of hemosiderin deposition in the left cerebellar hemisphere. There is focal susceptibility in the location of the left MCA bifurcation, which corresponds to the occlusion demonstrated on time-of-flight images. Skull and upper cervical spine: The visualized skull base, calvarium, upper cervical spine and extracranial soft tissues are normal. Sinuses/Orbits: No fluid levels or advanced mucosal thickening. No mastoid effusion. Normal orbits. MRA HEAD FINDINGS Intracranial internal carotid arteries: Normal. Anterior cerebral arteries: Normal. Middle cerebral arteries: There is occlusion of the left middle cerebral artery at the site of the bifurcation. There is minimal flow related enhancement seen within the distal left MCA distribution. The right MCA is normal. Posterior communicating arteries: Present bilaterally. Posterior  cerebral arteries: Normal. Basilar artery: Normal. Vertebral arteries: Codominant. Normal. Superior cerebellar arteries: Normal. Anterior inferior cerebellar arteries: Normal. Posterior inferior cerebellar arteries: Normal. MRA NECK FINDINGS There is a normal 3 vessel aortic arch  branch pattern. The bilateral subclavian arteries are normal. There is approximately 30% stenosis at the proximal left internal carotid artery. No right carotid stenosis. The cervical courses of both vertebral arteries are normal. IMPRESSION: 1. 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 Multiple other punctate foci of acute ischemia scattered throughout both cerebral and cerebellar hemispheres suggest a central embolic process. No hemorrhage or mass effect. 2. 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 3. Multiple foci of contrast enhancement within the cerebellum and right frontal lobe, with the largest focus measuring up to 2 cm. Given the other findings, these findings may be secondary to subacute ischemia. However, follow-up imaging (MRI brain with and without contrast) is recommended in 4-6 weeks to exclude the possibility of a neoplastic process, primarily metastatic disease. 4. 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 Otherwise normal MRA of the neck. Critical Value/emergent results were called by telephone at the time of interpretation on 09/27/2016 at 12:08 am to [facility], [place] who verbally acknowledged these results. Electronically Signed   By: Deatra Robinson M.D.   On: 09/27/2016 00:18   2-D echocardiogram  - Left ventricle: Distal septal hypokinesis The cavity size was normal. Wall thickness was increased in a pattern of mild LVH. Systolic function was normal. The estimated ejection fraction was in the range of 50% to 55%. Wall motion was normal; there were no regional wall motion abnormalities. - Mitral valve: There was mild regurgitation. -  Atrial septum: No defect or patent foramen ovale was identified. Impressions:   No cardiac source of emboli was indentified.   PHYSICAL EXAM Frail elderly caucasian male not in distress. . Afebrile. Head is nontraumatic. Neck is supple without bruit.    Cardiac exam no murmur or gallop. Lungs are clear to auscultation. Distal pulses are well felt. Neurological Exam :  Drowsy but can be aroused. Globally aphasic can follow only few simple midline and one-step commands. Unable to answer his name. No gaze deviation. But will not follow gaze in either direction. Does not blink to threat from either side. Right lower facial weakness. Tongue midline. Motor system exam shows mild right hemiparesis 4/5 strength with right upper and lower eczema to drift. Moves left side well against gravity and purposefully. Coordination cannot be reliably tested. Gait not tested.  ASSESSMENT/PLAN Mr. WALTON DIGILIO is a 75 y.o. male with history of hypertension, hyperlipidemia, diabetes and chronic kidney disease presenting with marked aphasia and right-sided weakness. He did not receive IV t-PA due to delay in arrival.   Stroke:  Left in CA, as well as scattered bilateral cerebral and cerebellar infarcts, embolic secondary to unknown source   MRI  left MCA insular frontal infarct with scattered bilateral cerebral and cerebellar infarcts  MRA head   occlusion of left MCA bifurcation  MRA neck left ICA 30% stenosis, otherwise normal  2D Echo  EF 50-55%, no source of embolus.   LDL 120  HgbA1c pending  On full dose Lovenox for suspected MI. Contraindicated in acute stroke. Changed to Lovenox 40 mg daily for VTE prophylaxis.  Diet NPO time specified  aspirin 81 mg daily prior to admission, now on aspirin 300 mg suppository daily  Ongoing aggressive stroke risk factor management  Therapy recommendations:  CIR  Disposition:  pending (lived with non-famliy roommate, not married, no kids. Niece  Nok)  Hypertension  Stable  Permissive hypertension (OK if < 220/120) but gradually normalize in 5-7 days  Long-term BP goal normotensive  Hyperlipidemia  Home meds:  lipitor 80 mg daily  Resume statin when able to swallow or tube placed  LDL 120, goal < 70  Continue statin at discharge  Diabetes type II  HgbA1c pending , goal < 7.0  Uncontrolled  Other Stroke Risk Factors  Advanced age  Former Cigarette smoker  ETOH use  Other Active Problems  Suspected MI - started on full dose lovenox. Without clear indication in setting of large infarct with hemorrhagic transformation, will stop full dose lovenox and replace with DVT prophylaxis dose. If AC needed for MI, prefer IV heparin.  Hospital day # 1  Rhoderick Moody [facility] See Amion for Pager information 09/27/2016 10:27 AM  I have personally examined this patient, reviewed notes, independently viewed imaging studies, participated in medical decision making and plan of care.ROS completed by me personally and pertinent positives fully documented  I have made any additions or clarifications directly to the above note. Agree with note above. Patient has presented with aphasia and right hemiparesis and MRI scan shows multiple vascular territory infarcts likely embolic but source to be determined but likely cardiac.. I had a long discussion on the bedside with the patient's niece and answered questions about his presentation, plan for evaluation, treatment. Greater than 50% time during this 35 minute visit was spent on counseling and coordination of care about his stroke risk, evaluation and treatment  Delia Heady, MD Medical Director [facility] Pager: [phone] 09/27/2016 4:27 PM  To contact Stroke Continuity provider, please refer to [email or web address]. After hours, contact General Neurology

## 2016-09-27 NOTE — Progress Notes (Signed)
Consulting Urology MD assessed and placed [id number] foley at bedside due to anatomical constraints. Clear yellow urine returned upon placement. Will continue to monitor.

## 2016-09-27 NOTE — Evaluation (Signed)
Physical Therapy Evaluation Patient Details Name: Douglas [name] MRN: [id number] DOB: 01/22/1941 Today's Date: 09/27/2016   History of Present Illness  Douglas AlimentJames L [name] a 75 y.o.malewith medical history significant of HTN, [name], DM, [name] presenting with a stroke. Pt with noted R sided weakness and expressive aphasia. 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  Clinical Impression  Pt admitted with above. Pt with noted R sided neglect, weakness, global aphasia and impaired balance and coordination. Pt is an excellent candidate for intense rehab program as pt was indep PTA. If pt unable to go to CIR pt will need SNF upon d/c to address mentioned deficits.    Follow Up Recommendations CIR;Supervision/Assistance - 24 hour    Equipment Recommendations   (TBD)    Recommendations for Other Services Rehab consult     Precautions / Restrictions Precautions Precautions: Fall Precaution Comments: R sided neglect Restrictions Weight Bearing Restrictions: No      Mobility  Bed Mobility Overal bed mobility: Needs Assistance Bed Mobility: Rolling;Sidelying to Sit;Sit to Supine Rolling: Min assist Sidelying to sit: Mod assist;+2 for physical assistance   Sit to supine: Mod assist;+2 for physical assistance   General bed mobility comments: max directional verbal and tactile cues to complete task. pt initiated with bringing LEs off EOB but required modA to complete, modA for trunk management in/out of bed  Transfers Overall transfer level: Needs assistance   Transfers: Sit to/from Stand Sit to Stand: Min assist;+2 physical assistance         General transfer comment: pt initiated powering up but once standing required mod/maxAx2 due to strong R lateral knee and locking out of R knee. Pt HR increased into 130s, pt returned to sitting EOB  Ambulation/Gait             General Gait Details: unable due to lethargy and increased HR  Stairs            Wheelchair Mobility    Modified Rankin (Stroke Patients Only) Modified Rankin (Stroke Patients Only) Pre-Morbid Rankin Score: No symptoms Modified Rankin: Severe disability     Balance Overall balance assessment: Needs assistance Sitting-balance support: Feet supported;Bilateral upper extremity supported Sitting balance-Leahy Scale: Poor Sitting balance - Comments: pt with strong lateral lean to the R, maxA to maintain midline position. pt unable to sense midline or self correct   Standing balance support: Bilateral upper extremity supported Standing balance-Leahy Scale: Poor Standing balance comment: pt with lean to the Right                             Pertinent Vitals/Pain Pain Assessment: Faces Faces Pain Scale: No hurt    Home Living Family/patient expects to be discharged to:: Inpatient rehab                 Additional Comments: pt lived with a roomate who works during the day. 1 story home with 3-4 steps to enter.     Prior Function Level of Independence: Independent         Comments: pt didn't use AD, was driving, cooking, bathing, dressing all independently.      Hand Dominance   Dominant Hand: Right    Extremity/Trunk Assessment   Upper Extremity Assessment Upper Extremity Assessment: Difficult to assess due to impaired cognition;Defer to OT evaluation    Lower Extremity Assessment Lower Extremity Assessment: RLE deficits/detail;Difficult to assess due to impaired cognition RLE Deficits /  Details: pt with decreased R LE voluntary mvmt but was able to stand without LE buckling. Anticipate pt to have coordination deficits. unable to complete MMT due to impaired cognition    Cervical / Trunk Assessment Cervical / Trunk Assessment: Normal  Communication   Communication: Receptive difficulties;Expressive difficulties  Cognition Arousal/Alertness: Lethargic Behavior During Therapy: Flat affect Overall Cognitive Status:  Impaired/Different from baseline Area of Impairment: Following commands;Safety/judgement;Awareness;Problem solving;Attention   Current Attention Level: Focused   Following Commands: Follows one step commands with increased time;Follows one step commands inconsistently Safety/Judgement: Decreased awareness of safety;Decreased awareness of deficits (R sided neglect) Awareness: Intellectual Problem Solving: Slow processing;Decreased initiation;Difficulty sequencing;Requires verbal cues;Requires tactile cues General Comments: pt very lethargic but did become more alert s/p 15 min of working with therapy. Pt extremely delayed processing but did verbalize yes/no questions 25% of time    General Comments      Exercises     Assessment/Plan    PT Assessment Patient needs continued PT services  PT Problem List Decreased strength;Decreased range of motion;Decreased activity tolerance;Decreased balance;Decreased mobility;Decreased coordination;Decreased cognition          PT Treatment Interventions DME instruction;Gait training;Stair training;Functional mobility training;Therapeutic activities;Therapeutic exercise    PT Goals (Current goals can be found in the Care Plan section)  Acute Rehab PT Goals Patient Stated Goal: didn't state PT Goal Formulation: With patient/family Time For Goal Achievement: 10/11/16 Potential to Achieve Goals: Good    Frequency Min 4X/week   Barriers to discharge Decreased caregiver support alone during the day    Co-evaluation PT/OT/SLP Co-Evaluation/Treatment: Yes Reason for Co-Treatment: Complexity of the patient's impairments (multi-system involvement);To address functional/ADL transfers PT goals addressed during session: Mobility/safety with mobility         End of Session Equipment Utilized During Treatment: Gait belt Activity Tolerance: Patient tolerated treatment well (limited by increased HR to 130s) Patient left: in bed;with call bell/phone  within reach;with family/visitor present Nurse Communication: Mobility status         Time: [id number] PT Time Calculation (min) (ACUTE ONLY): 34 min   Charges:   PT Evaluation $PT Eval Moderate Complexity: 1 Procedure     PT G Codes:        Douglas [name] 09/27/2016, 11:56 AM   [name] ShockAshly Liandra Mendia, PT, DPT Pager #: [phone] Office #: [phone]

## 2016-09-27 NOTE — Progress Notes (Signed)
MRA neck completed 09/26/16 with no right ICA stenosis and 30% left ICA stenosis. Please advise if carotid duplex is still needed.  Vascular lab Farrel DemarkJill Eunice, RDMS, RVT 09/27/2016

## 2016-09-27 NOTE — Progress Notes (Signed)
Rehab Admissions Coordinator Note:  Patient was screened by Trish MageLogue, Brighton Pilley M for appropriateness for an Inpatient Acute Rehab Consult.  At this time, we are recommending Inpatient Rehab consult.  Trish MageLogue, Marty Uy M 09/27/2016, 12:06 PM  I can be reached at [phone].

## 2016-09-27 NOTE — Evaluation (Signed)
Clinical/Bedside Swallow Evaluation Patient Details  Name: Douglas [name] MRN: [id number] Date of Birth: 01/16/1941  Today's Date: 09/27/2016 Time: SLP Start Time (ACUTE ONLY): 1432 SLP Stop Time (ACUTE ONLY): 1440 SLP Time Calculation (min) (ACUTE ONLY): 8 min  Past Medical History:  Past Medical History:  Diagnosis Date  . CKD (chronic kidney disease)   . DM (diabetes mellitus) (HCC)   . Hyperlipidemia   . Hypertension    Past Surgical History: History reviewed. No pertinent surgical history. HPI:  75 y.o. male with medical history significant of HTN, [name], DM, [name] presenting with a stroke. Pt with noted R sided weakness and expressive aphasia. 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   Assessment / Plan / Recommendation Clinical Impression  Pt has right-sided weakness that prolongs mastication and oral manipulation, although with only mild residue remaining. He has immediate coughing with thin liquids, concerning for aspiration of premature spillage given reduced oral control. No further coughing was observed with additional consistencies tested. Recommend Dys 2 diet and nectar thick liquids. SLP will f/u for tolerance and readiness to advance.    Aspiration Risk  Mild aspiration risk    Diet Recommendation Dysphagia 2 (Fine chop);Nectar-thick liquid   Liquid Administration via: Cup;Straw Medication Administration: Whole meds with puree Supervision: Full supervision/cueing for compensatory strategies;Staff to assist with self feeding Compensations: Slow rate;Small sips/bites;Lingual sweep for clearance of pocketing;Follow solids with liquid Postural Changes: Seated upright at 90 degrees;Remain upright for at least 30 minutes after po intake    Other  Recommendations Oral Care Recommendations: Oral care BID Other Recommendations: Order thickener from pharmacy;Prohibited food (jello, ice cream, thin soups);Remove water pitcher   Follow up  Recommendations Inpatient Rehab      Frequency and Duration min 2x/week  2 weeks       Prognosis Prognosis for Safe Diet Advancement: Good Barriers to Reach Goals: Language deficits      Swallow Study   General HPI: 75 y.o. male with medical history significant of HTN, [name], DM, [name] presenting with a stroke. Pt with noted R sided weakness and expressive aphasia. 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 Type of Study: Bedside Swallow Evaluation Previous Swallow Assessment: none in chart Diet Prior to this Study: NPO Temperature Spikes Noted: Yes (100.1) Respiratory Status: Room air History of Recent Intubation: No Behavior/Cognition: Alert;Cooperative;Requires cueing Oral Cavity Assessment: Within Functional Limits Oral Care Completed by SLP: No Oral Cavity - Dentition: Missing dentition Vision: Functional for self-feeding Self-Feeding Abilities: Needs assist Patient Positioning: Upright in bed Baseline Vocal Quality: Normal Volitional Cough: Cognitively unable to elicit    Oral/Motor/Sensory Function Overall Oral Motor/Sensory Function: Moderate impairment Facial ROM: Reduced right;Suspected CN VII (facial) dysfunction Facial Symmetry: Abnormal symmetry right;Suspected CN VII (facial) dysfunction Facial Strength: Reduced right;Suspected CN VII (facial) dysfunction   Ice Chips Ice chips: Not tested   Thin Liquid Thin Liquid: Impaired Presentation: Cup;Self Fed Pharyngeal  Phase Impairments: Suspected delayed Swallow;Cough - Immediate    Nectar Thick Nectar Thick Liquid: Within functional limits Presentation: Self Fed;Cup;Straw   Honey Thick Honey Thick Liquid: Not tested   Puree Puree: Within functional limits Presentation: Spoon   Solid   GO   Solid: Impaired Oral Phase Functional Implications: Impaired mastication;Oral residue        Maxcine Hamaiewonsky, Walaa Carel 09/27/2016,4:10 PM  Maxcine HamLaura Paiewonsky, M.A.  CCC-SLP [phone]

## 2016-09-27 NOTE — Evaluation (Signed)
MRI brain completed. Preliminary assessment of the images reveals multiple subcentimeter foci of restricted diffusion at the cortical juxtacortical interfaces in multiple vascular territories, suspicious for shower emboli/cardioembolic strokes. Also seen is a moderate-size acute ischemic infarction involving the left insula and medial left temporal lobe. The above findings are consistent with completed strokes and there is no indication for CT perfusion study.   TTE has been ordered. Blood cultures also obtained.   Recommend anticoagulation given high suspicion for cardioembolic strokes.   Full consult note to follow.   Electronically signed: Dr. Caryl PinaEric Noble Cicalese

## 2016-09-27 NOTE — Progress Notes (Signed)
  Echocardiogram 2D Echocardiogram with Bubble study has been performed.  Douglas SavoyCasey N Riham [name] 09/27/2016, 12:06 PM

## 2016-09-27 NOTE — Progress Notes (Signed)
SLP Cancellation Note  Patient Details Name: Douglas [name] MRN: [id number] DOB: 07/03/1941   Cancelled treatment:       Reason Eval/Treat Not Completed: Patient at procedure or test/unavailable. Will f/u as able.   Maxcine Hamaiewonsky, Arren Laminack 09/27/2016, 11:35 AM  Maxcine HamLaura Paiewonsky, M.A. CCC-SLP [phone]

## 2016-09-27 NOTE — Progress Notes (Signed)
Continued attempts at voiding have continue to be unsuccessful and bladder scan was 750, therefore an in and out cath was attempted but due to anatomical constraints this nurse was unable to preform in and out cath, Dr. Waymon AmatoHongalgi was notified and stated urology would be consulted. Will continue to monitor

## 2016-09-27 NOTE — Evaluation (Signed)
Speech Language Pathology Evaluation Patient Details Name: Douglas [name] MRN: [id number] DOB: 05/19/1941 Today's Date: 09/27/2016 Time: [id number] SLP Time Calculation (min) (ACUTE ONLY): 9 min  Problem List:  Patient Active Problem List   Diagnosis Date Noted  . CKD (chronic kidney disease) 09/27/2016  . Elevated troponin 09/27/2016  . Anemia 09/27/2016  . Acute embolic stroke (HCC)   . Acute systolic heart failure (HCC)   . Benign essential HTN   . Tachycardia   . 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 Hyperlipidemia 11/30/2013  . Essential hypertension 11/30/2013  . DM (diabetes mellitus) (HCC) 11/30/2013   Past Medical History:  Past Medical History:  Diagnosis Date  . CKD (chronic kidney disease)   . DM (diabetes mellitus) (HCC)   . Hyperlipidemia   . Hypertension    Past Surgical History: History reviewed. No pertinent surgical history. HPI:  75 y.o. male with medical history significant of HTN, [name], DM, [name] presenting with a stroke. Pt with noted R sided weakness and expressive aphasia. 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 He nods his head "yes" in response to all basic, biographical yes/no questions and needs Mod-Max cues to follow one-step commands. This is mildly improved within a functional context, but he still needs Mod cues for basic functional problem solving. Verbal output is fluent but comprised only of jargon, with no true words elicited or repeated. He would benefit from intensive SLP f/u upon d/c.    SLP Assessment  Patient needs continued Speech Lanaguage Pathology Services    Follow Up Recommendations  Inpatient Rehab    Frequency and Duration min 2x/week  2 weeks      SLP Evaluation Cognition  Overall Cognitive Status: Impaired/Different from baseline (aphasia) Arousal/Alertness:  Awake/alert Attention: Sustained Sustained Attention: Appears intact Problem Solving: Impaired Problem Solving Impairment: Functional basic       Comprehension  Auditory Comprehension Overall Auditory Comprehension: Impaired Yes/No Questions: Impaired Basic Biographical Questions: 26-50% accurate Basic Immediate Environment Questions: 25-49% accurate Commands: Impaired One Step Basic Commands: 25-49% accurate    Expression Expression Primary Mode of Expression: Verbal Verbal Expression Overall Verbal Expression: Impaired Repetition: Impaired Level of Impairment: Word level Naming: Impairment Confrontation: Impaired   Oral / Motor  Oral Motor/Sensory Function Overall Oral Motor/Sensory Function: Moderate impairment Facial ROM: Reduced right;Suspected CN VII (facial) dysfunction Facial Symmetry: Abnormal symmetry right;Suspected CN VII (facial) dysfunction Facial Strength: Reduced right;Suspected CN VII (facial) dysfunction Motor Speech Overall Motor Speech: Other (comment) (difficult to assess, no clear words said)   GO                    Douglas [name], Douglas [name] 09/27/2016, 4:27 PM  Douglas [name], M.A. CCC-SLP [phone]

## 2016-09-27 NOTE — Progress Notes (Signed)
Initial Nutrition Assessment  DOCUMENTATION CODES:   Not applicable  INTERVENTION:    Diet advancement as able pending swallow evaluation.   If unable to advance diet, recommend placing Cortrak tube for TF--Jevity 1.2 at 65 ml/h would provide 1872 kcal, 87 gm protein, 1264 ml free water daily.  NUTRITION DIAGNOSIS:   Inadequate oral intake related to inability to eat as evidenced by NPO status.  GOAL:   Patient will meet greater than or equal to 90% of their needs  MONITOR:   Diet advancement, I & O's  REASON FOR ASSESSMENT:   Consult  (stroke)  ASSESSMENT:   75 y.o. male with PMH of HTN, [name], DM, [name] who presented on 12/12 with a stroke.  Patient with ongoing aphasia per RN. Remains NPO. For swallow evaluation with SLP today. Patient currently out of room for echo. Unable to complete Nutrition-Focused physical exam at this time.   Diet Order:  Diet NPO time specified  Skin:  Reviewed, no issues  Last BM:  unknown  Height:   Ht Readings from Last 1 Encounters:  09/27/16 5\' 10"  (1.778 m)    Weight:   Wt Readings from Last 1 Encounters:  09/27/16 159 lb 4.8 oz (72.3 kg)    Ideal Body Weight:  75.5 kg  BMI:  Body mass index is 22.86 kg/m.  Estimated Nutritional Needs:   Kcal:  1800-2000  Protein:  85-100 gm  Fluid:  1.8-2 L  EDUCATION NEEDS:   No education needs identified at this time  Joaquin CourtsKimberly Harris, RD, LDN, CNSC Pager [phone] After Hours Pager [phone]

## 2016-09-27 NOTE — Progress Notes (Signed)
Patient spontaneously, incontinently bladder scan revealed post void residual continued to remain elevated at 650. Will continued to monitor and await urology consult.

## 2016-09-27 NOTE — Progress Notes (Signed)
Hypoglycemic Event  CBG: 62  Treatment: D50 IV 25 mL  Symptoms: None  Follow-up CBG: OZDG:[id number]:0845 CBG Result:109  Possible Reasons for Event: Inadequate meal intake  Comments/MD notified:Dr. Hongalgi notified     Cherene Julianavis, Akansha Wyche Fay

## 2016-09-27 NOTE — Consult Note (Signed)
Urology Consult   Physician requesting consult: Dr. 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  In addition, there is concern about an acute myocardial infarction.  He has been unable to void and has noted to have over 700 cc this bladder by bladder scanner.  Multiple attempts to made to place a catheter.  However, a catheter has been unable to be placed due to the patient's severe phimosis.  A urology consultation was therefore obtained to allow placement of a catheter.  An adequate history is otherwise unable to be obtained due to the patient's inability to respond to questions.    Past Medical History:  Diagnosis Date  . CKD (chronic kidney disease)   . DM (diabetes mellitus) (HCC)   . Hyperlipidemia   . Hypertension     History reviewed. No pertinent surgical history.  Medications:  Home meds: . Current Meds  Medication Sig  . atorvastatin (LIPITOR) 80 MG tablet Take 80 mg by mouth daily.  . Insulin Glargine (BASAGLAR KWIKPEN) 100 UNIT/ML SOPN Inject 60 Units into the skin daily. Titrate as directed at the same time every day  . levothyroxine (SYNTHROID, LEVOTHROID) 75 MCG tablet Take 75 mcg by mouth daily before breakfast.   . lisinopril (PRINIVIL,ZESTRIL) 20 MG tablet Take 20 mg by mouth daily.  . TRADJENTA 5 MG TABS tablet Take 5 mg by mouth every morning.     Scheduled Meds: . aspirin  300 mg Rectal Daily   Or  . aspirin  325 mg Oral Daily  . [START ON 09/28/2016] enoxaparin (LOVENOX) injection  40 mg Subcutaneous Q24H   Continuous Infusions: . dextrose 5 % and 0.9% NaCl 50 mL/hr at 09/27/16 1045   PRN Meds:.acetaminophen **OR** acetaminophen (TYLENOL) oral liquid 160 mg/5 mL **OR** acetaminophen, RESOURCE THICKENUP CLEAR, senna-docusate  Allergies: No Known Allergies  Family History  Problem  Relation Age of Onset  . Heart disease Mother   . Cancer Father   . Stroke Brother 20    Social History:  reports that he quit smoking about 34 years ago. He has never used smokeless tobacco. He reports that he drinks alcohol. He reports that he does not use drugs.  ROS: A complete review of systems was performed.  All systems are negative except for pertinent findings as noted.  Physical Exam:  Vital signs in last 24 hours: Temp:  [97.7 F (36.5 C)-100.1 F (37.8 C)] 98.6 F (37 C) (12/13 1609) Pulse Rate:  [87-100] 100 (12/13 1609) Resp:  [15-20] 15 (12/13 1609) BP: (106-167)/(56-100) 136/70 (12/13 1609) SpO2:  [94 %-100 %] 100 % (12/13 1609) Weight:  [72.3 kg (159 lb 4.8 oz)] 72.3 kg (159 lb 4.8 oz) (12/13 0054) Constitutional: The patient is not responsive to questions.  GU: He has severe phimosis which is unable to be retracted to fully expose the glans penis.  His testes are descended bilaterally.  No scrotal abnormalities area no testicular abnormalities.  Laboratory Data:   Recent Labs  09/26/16 1834  HGB 10.2*  HCT 30.0*     Recent Labs  09/26/16 1800 09/26/16 1834  NA 138 138  K 3.7 3.8  CL 105 105  GLUCOSE 89 85  BUN 18 20  CALCIUM 9.4  --   CREATININE 1.62* 1.60*     Results for orders placed or performed during the hospital encounter of 09/26/16 (  from the past 24 hour(s))  I-stat troponin, ED     Status: Abnormal   Collection Time: 09/26/16  9:30 PM  Result Value Ref Range   Troponin i, poc 0.27 (HH) 0.00 - 0.08 ng/mL   Comment NOTIFIED PHYSICIAN    Comment 3          Glucose, capillary     Status: Abnormal   Collection Time: 09/27/16  2:53 AM  Result Value Ref Range   Glucose-Capillary 38 (LL) 65 - 99 mg/dL   Comment 1 Notify RN   Troponin I     Status: Abnormal   Collection Time: 09/27/16  3:17 AM  Result Value Ref Range   Troponin I 0.62 (HH) <0.03 ng/mL  Lipid panel     Status: Abnormal   Collection Time: 09/27/16  3:17 AM  Result  Value Ref Range   Cholesterol 173 0 - 200 mg/dL   Triglycerides 161 <096 mg/dL   HDL 33 (L) >04 mg/dL   Total CHOL/HDL Ratio 5.2 RATIO   VLDL 20 0 - 40 mg/dL   LDL Cholesterol 540 (H) 0 - 99 mg/dL  Glucose, capillary     Status: Abnormal   Collection Time: 09/27/16  3:28 AM  Result Value Ref Range   Glucose-Capillary 152 (H) 65 - 99 mg/dL  MRSA PCR Screening     Status: None   Collection Time: 09/27/16  3:40 AM  Result Value Ref Range   MRSA by PCR NEGATIVE NEGATIVE  Troponin I     Status: Abnormal   Collection Time: 09/27/16  8:10 AM  Result Value Ref Range   Troponin I 0.64 (HH) <0.03 ng/mL  Glucose, capillary     Status: Abnormal   Collection Time: 09/27/16  8:10 AM  Result Value Ref Range   Glucose-Capillary 62 (L) 65 - 99 mg/dL   Comment 1 Notify RN    Comment 2 Document in Chart   Glucose, capillary     Status: Abnormal   Collection Time: 09/27/16  9:08 AM  Result Value Ref Range   Glucose-Capillary 109 (H) 65 - 99 mg/dL   Comment 1 Notify RN    Comment 2 Document in Chart   Glucose, capillary     Status: None   Collection Time: 09/27/16 12:02 PM  Result Value Ref Range   Glucose-Capillary 91 65 - 99 mg/dL   Comment 1 Notify RN    Comment 2 Document in Chart   Troponin I     Status: Abnormal   Collection Time: 09/27/16  4:07 PM  Result Value Ref Range   Troponin I 0.54 (HH) <0.03 ng/mL  Glucose, capillary     Status: Abnormal   Collection Time: 09/27/16  4:08 PM  Result Value Ref Range   Glucose-Capillary 133 (H) 65 - 99 mg/dL   Comment 1 Notify RN    Comment 2 Document in Chart    Recent Results (from the past 240 hour(s))  MRSA PCR Screening     Status: None   Collection Time: 09/27/16  3:40 AM  Result Value Ref Range Status   MRSA by PCR NEGATIVE NEGATIVE Final    Comment:        The GeneXpert MRSA Assay (FDA approved for NASAL specimens only), is one component of a comprehensive MRSA colonization surveillance program. It is not intended to diagnose  MRSA infection nor to guide or monitor treatment for MRSA infections.     Renal Function:  Recent Labs  09/26/16 1800 09/26/16  1834  CREATININE 1.62* 1.60*   Estimated Creatinine Clearance: 40.8 mL/min (by C-G formula based on SCr of 1.6 mg/dL (H)).  Radiologic Imaging: Dg Chest 2 View  Result Date: 09/26/2016 CLINICAL DATA:  Altered mental status. EXAM: CHEST  2 VIEW COMPARISON:  None. FINDINGS: Mild cardiomegaly is noted. No pneumothorax or pleural effusion is noted. Left lung is clear. Ill-defined opacity is seen in the right lung concerning for possible pneumonia or edema. Bony thorax is unremarkable. IMPRESSION: Ill-defined opacity seen in right lung concerning for possible pneumonia or edema. Electronically Signed   By: Lupita Raider, M.D.   On: 09/26/2016 19:08   Ct Head Wo Contrast  Result Date: 09/26/2016 CLINICAL DATA:  Found down on floor today.  Aphasia EXAM: CT HEAD WITHOUT CONTRAST TECHNIQUE: Contiguous axial images were obtained from the base of the skull through the vertex without intravenous contrast. COMPARISON:  None. FINDINGS: Brain: There is no acute hemorrhage. No focal mass lesion. Suspected loss of the gray-white matter differentiation involving the left insula, inferior frontal, and anterior temporal lobes. Moderate atrophy. Ventricles slightly enlarged but felt secondary to atrophy. Vascular: Carotid artery calcifications. Questionable mildly dense distal left MCA. Skull: Mastoid air cells are clear. No fracture. Prominent mandibular condyles with degenerative changes. Sinuses/Orbits: No acute fluid levels in the sinuses. Mild mucosal thickening in the ethmoid sinuses. No acute orbital abnormality. Other: None IMPRESSION: 1. Suspect edema within the left insular cortex, anterior temporal lobe and left inferior frontal lobe. MRI is recommended for further evaluation. Possible mildly dense distal left MCA artery. 2. No hemorrhage. 3. Atrophy Electronically Signed    By: Jasmine Pang M.D.   On: 09/26/2016 21:04   Mr Angiogram Head Wo Contrast  Result Date: 09/27/2016 CLINICAL DATA:  Right-sided paralysis and aphasia. EXAM: MRI HEAD WITHOUT AND WITH CONTRAST MRA HEAD WITHOUT CONTRAST MRA NECK WITHOUT AND WITH CONTRAST TECHNIQUE: Multiplanar, multiecho pulse sequences of the brain and surrounding structures were obtained without and with intravenous contrast. Angiographic images of the Circle of Willis were obtained using MRA technique without intravenous contrast. Angiographic images of the neck were obtained using MRA technique without and with intravenous contrast. Carotid stenosis measurements (when applicable) are obtained utilizing NASCET criteria, using the distal internal carotid diameter as the denominator. CONTRAST:  15 mL MultiHance IV COMPARISON:  Head CT 09/26/2016 FINDINGS: MRI HEAD FINDINGS Brain: The midline structures are normal. There is extensive diffusion restriction in the left insular cortex and frontal operculum. There are additional small foci of diffusion restriction scattered throughout both cerebral hemispheres and both sides of the cerebellum. There is no midline shift or significant mass effect. No evidence of acute hemorrhage. There is multifocal hyperintense T2-weighted signal within the periventricular white matter, most often seen in the setting of chronic microvascular ischemia. No hydrocephalus or extra-axial fluid collection. No lobar predominant atrophy pattern. There are areas of contrast enhancement within the cerebellum. Within the peripheral aspect of the left hemisphere, in the area of peripheral contrast enhancement measures 1.9 x 1.1 cm. A smaller focus near the cerebellar midline measures 4 mm. There is an additional focus of contrast enhancement along the right precentral gyrus. Vascular: Major intracranial arterial and venous sinus flow voids are preserved. Area of hemosiderin deposition in the left cerebellar hemisphere. There  is focal susceptibility in the location of the left MCA bifurcation, which corresponds to the occlusion demonstrated on time-of-flight images. Skull and upper cervical spine: The visualized skull base, calvarium, upper cervical spine and extracranial soft tissues  are normal. Sinuses/Orbits: No fluid levels or advanced mucosal thickening. No mastoid effusion. Normal orbits. MRA HEAD FINDINGS Intracranial internal carotid arteries: Normal. Anterior cerebral arteries: Normal. Middle cerebral arteries: There is occlusion of the left middle cerebral artery at the site of the bifurcation. There is minimal flow related enhancement seen within the distal left MCA distribution. The right MCA is normal. Posterior communicating arteries: Present bilaterally. Posterior cerebral arteries: Normal. Basilar artery: Normal. Vertebral arteries: Codominant. Normal. Superior cerebellar arteries: Normal. Anterior inferior cerebellar arteries: Normal. Posterior inferior cerebellar arteries: Normal. MRA NECK FINDINGS There is a normal 3 vessel aortic arch branch pattern. The bilateral subclavian arteries are normal. There is approximately 30% stenosis at the proximal left internal carotid artery. No right carotid stenosis. The cervical courses of both vertebral arteries are normal. IMPRESSION: 1. 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 Multiple other punctate foci of acute ischemia scattered throughout both cerebral and cerebellar hemispheres suggest a central embolic process. No hemorrhage or mass effect. 2. 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 3. Multiple foci of contrast enhancement within the cerebellum and right frontal lobe, with the largest focus measuring up to 2 cm. Given the other findings, these findings may be secondary to subacute ischemia. However, follow-up imaging (MRI brain with and without contrast) is recommended in 4-6 weeks to exclude the possibility of a  neoplastic process, primarily metastatic disease. 4. 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 Otherwise normal MRA of the neck. Critical Value/emergent results were called by telephone at the time of interpretation on 09/27/2016 at 12:08 am to [facility] who verbally acknowledged these results. Electronically Signed   By: Deatra Robinson M.D.   On: 09/27/2016 00:18   Mr Angiogram Neck W Or Wo Contrast  Result Date: 09/27/2016 CLINICAL DATA:  Right-sided paralysis and aphasia. EXAM: MRI HEAD WITHOUT AND WITH CONTRAST MRA HEAD WITHOUT CONTRAST MRA NECK WITHOUT AND WITH CONTRAST TECHNIQUE: Multiplanar, multiecho pulse sequences of the brain and surrounding structures were obtained without and with intravenous contrast. Angiographic images of the Circle of Willis were obtained using MRA technique without intravenous contrast. Angiographic images of the neck were obtained using MRA technique without and with intravenous contrast. Carotid stenosis measurements (when applicable) are obtained utilizing NASCET criteria, using the distal internal carotid diameter as the denominator. CONTRAST:  15 mL MultiHance IV COMPARISON:  Head CT 09/26/2016 FINDINGS: MRI HEAD FINDINGS Brain: The midline structures are normal. There is extensive diffusion restriction in the left insular cortex and frontal operculum. There are additional small foci of diffusion restriction scattered throughout both cerebral hemispheres and both sides of the cerebellum. There is no midline shift or significant mass effect. No evidence of acute hemorrhage. There is multifocal hyperintense T2-weighted signal within the periventricular white matter, most often seen in the setting of chronic microvascular ischemia. No hydrocephalus or extra-axial fluid collection. No lobar predominant atrophy pattern. There are areas of contrast enhancement within the cerebellum. Within the peripheral aspect of the left hemisphere, in the  area of peripheral contrast enhancement measures 1.9 x 1.1 cm. A smaller focus near the cerebellar midline measures 4 mm. There is an additional focus of contrast enhancement along the right precentral gyrus. Vascular: Major intracranial arterial and venous sinus flow voids are preserved. Area of hemosiderin deposition in the left cerebellar hemisphere. There is focal susceptibility in the location of the left MCA bifurcation, which corresponds to the occlusion demonstrated on time-of-flight images.  Skull and upper cervical spine: The visualized skull base, calvarium, upper cervical spine and extracranial soft tissues are normal. Sinuses/Orbits: No fluid levels or advanced mucosal thickening. No mastoid effusion. Normal orbits. MRA HEAD FINDINGS Intracranial internal carotid arteries: Normal. Anterior cerebral arteries: Normal. Middle cerebral arteries: There is occlusion of the left middle cerebral artery at the site of the bifurcation. There is minimal flow related enhancement seen within the distal left MCA distribution. The right MCA is normal. Posterior communicating arteries: Present bilaterally. Posterior cerebral arteries: Normal. Basilar artery: Normal. Vertebral arteries: Codominant. Normal. Superior cerebellar arteries: Normal. Anterior inferior cerebellar arteries: Normal. Posterior inferior cerebellar arteries: Normal. MRA NECK FINDINGS There is a normal 3 vessel aortic arch branch pattern. The bilateral subclavian arteries are normal. There is approximately 30% stenosis at the proximal left internal carotid artery. No right carotid stenosis. The cervical courses of both vertebral arteries are normal. IMPRESSION: 1. 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 Multiple other punctate foci of acute ischemia scattered throughout both cerebral and cerebellar hemispheres suggest a central embolic process. No hemorrhage or mass effect. 2. Occlusion of the left MCA at the level of  the bifurcation. 3. Multiple foci of contrast enhancement within the cerebellum and right frontal lobe, with the largest focus measuring up to 2 cm. Given the other findings, these findings may be secondary to subacute ischemia. However, follow-up imaging (MRI brain with and without contrast) is recommended in 4-6 weeks to exclude the possibility of a neoplastic process, primarily metastatic disease. 4. 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 Otherwise normal MRA of the neck. Critical Value/emergent results were called by telephone at the time of interpretation on 09/27/2016 at 12:08 am to [facility] who verbally acknowledged these results. Electronically Signed   By: Deatra RobinsonKevin  Herman M.D.   On: 09/27/2016 00:18   Mr Laqueta JeanBrain W ZOWo Contrast  Result Date: 09/27/2016 CLINICAL DATA:  Right-sided paralysis and aphasia. EXAM: MRI HEAD WITHOUT AND WITH CONTRAST MRA HEAD WITHOUT CONTRAST MRA NECK WITHOUT AND WITH CONTRAST TECHNIQUE: Multiplanar, multiecho pulse sequences of the brain and surrounding structures were obtained without and with intravenous contrast. Angiographic images of the Circle of Willis were obtained using MRA technique without intravenous contrast. Angiographic images of the neck were obtained using MRA technique without and with intravenous contrast. Carotid stenosis measurements (when applicable) are obtained utilizing NASCET criteria, using the distal internal carotid diameter as the denominator. CONTRAST:  15 mL MultiHance IV COMPARISON:  Head CT 09/26/2016 FINDINGS: MRI HEAD FINDINGS Brain: The midline structures are normal. There is extensive diffusion restriction in the left insular cortex and frontal operculum. There are additional small foci of diffusion restriction scattered throughout both cerebral hemispheres and both sides of the cerebellum. There is no midline shift or significant mass effect. No evidence of acute hemorrhage. There is multifocal  hyperintense T2-weighted signal within the periventricular white matter, most often seen in the setting of chronic microvascular ischemia. No hydrocephalus or extra-axial fluid collection. No lobar predominant atrophy pattern. There are areas of contrast enhancement within the cerebellum. Within the peripheral aspect of the left hemisphere, in the area of peripheral contrast enhancement measures 1.9 x 1.1 cm. A smaller focus near the cerebellar midline measures 4 mm. There is an additional focus of contrast enhancement along the right precentral gyrus. Vascular: Major intracranial arterial and venous sinus flow voids are preserved. Area of hemosiderin deposition in the left cerebellar hemisphere. There is focal susceptibility in the  location of the left MCA bifurcation, which corresponds to the occlusion demonstrated on time-of-flight images. Skull and upper cervical spine: The visualized skull base, calvarium, upper cervical spine and extracranial soft tissues are normal. Sinuses/Orbits: No fluid levels or advanced mucosal thickening. No mastoid effusion. Normal orbits. MRA HEAD FINDINGS Intracranial internal carotid arteries: Normal. Anterior cerebral arteries: Normal. Middle cerebral arteries: There is occlusion of the left middle cerebral artery at the site of the bifurcation. There is minimal flow related enhancement seen within the distal left MCA distribution. The right MCA is normal. Posterior communicating arteries: Present bilaterally. Posterior cerebral arteries: Normal. Basilar artery: Normal. Vertebral arteries: Codominant. Normal. Superior cerebellar arteries: Normal. Anterior inferior cerebellar arteries: Normal. Posterior inferior cerebellar arteries: Normal. MRA NECK FINDINGS There is a normal 3 vessel aortic arch branch pattern. The bilateral subclavian arteries are normal. There is approximately 30% stenosis at the proximal left internal carotid artery. No right carotid stenosis. The cervical  courses of both vertebral arteries are normal. IMPRESSION: 1. 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 Multiple other punctate foci of acute ischemia scattered throughout both cerebral and cerebellar hemispheres suggest a central embolic process. No hemorrhage or mass effect. 2. 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 3. Multiple foci of contrast enhancement within the cerebellum and right frontal lobe, with the largest focus measuring up to 2 cm. Given the other findings, these findings may be secondary to subacute ischemia. However, follow-up imaging (MRI brain with and without contrast) is recommended in 4-6 weeks to exclude the possibility of a neoplastic process, primarily metastatic disease. 4. 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 Otherwise normal MRA of the neck. Critical Value/emergent results were called by telephone at the time of interpretation on 09/27/2016 at 12:08 am to [facility] who verbally acknowledged these results. Electronically Signed   By: Deatra RobinsonKevin  Herman M.D.   On: 09/27/2016 00:18   [place] Renal  Result Date: 09/27/2016 CLINICAL DATA:  Urinary retention, hypertension, diabetes mellitus, chronic kidney disease EXAM: RENAL / URINARY TRACT ULTRASOUND COMPLETE COMPARISON:  Ultrasound abdomen 10/08/2015 FINDINGS: Right Kidney: Length: 5.1 cm. Cortical thinning. Upper normal cortical echogenicity. Small probable cyst at upper pole RIGHT kidney 1.6 x 1.8 x 2.0 cm, measured 2.7 x 1.8 x 2.0 cm on 10/08/2015. No additional mass, hydronephrosis or shadowing calcification. Left Kidney: Length: By 0.7 cm. Diffuse cortical thinning. Normal cortical echogenicity. No mass, hydronephrosis or shadowing calcification. Bladder: Decompressed by Foley catheter, unable to evaluate. IMPRESSION: Interval decrease in size of a small cyst at the upper pole RIGHT kidney. Age-related cortical atrophy without evidence of  additional renal mass or hydronephrosis. Electronically Signed   By: Ulyses SouthwardMark  Boles M.D.   On: 09/27/2016 16:55    I independently reviewed the above imaging studies.  Procedure: His foreskin was retracted to the degree that it was able to be.  However, the foreskin could not be completely retracted.  Betadine was used to sterilize the penis.  I was able to palpate the urethral meatus and subsequently was able to place a 16 French Foley catheter after multiple attempts into the urethra and into the bladder without difficulty.  This returned approximately 600 cc of grossly clear urine.  Impression/Recommendation 1.  Difficult urethral catheterization: The catheter should be left indwelling as long as medically necessary.  Once patient is awake and aware, he can undergo a voiding trial.  Please call if further questions.  Cc: Dr. Waymon AmatoHongalgi  Alisson Rozell,LES 09/27/2016, 7:08 PM    Moody Bruins. MD

## 2016-09-27 NOTE — ED Notes (Signed)
No blood cultures pt enroute to inpatient.

## 2016-09-27 NOTE — Progress Notes (Addendum)
PROGRESS NOTE  Douglas [name]  ZOX:[id number] DOB: 1941/06/27  DOA: 09/26/2016 PCP: Hollice Espy, MD   Brief Narrative:  75 year old male with PMH of HTN, [name], DM, CKD who presented to [facility] ED on 09/26/16 after his roommate found him in the floor of the living room and with speech difficulties. Last known well 09/26/16  and 12:30 PM. Workup confirmed bilateral embolic looking strokes and elevated troponin. Neurology and cardiology consulted.  Assessment & Plan:   Principal Problem:   Stroke (cerebrum) (HCC) Active Problems:   Hyperlipidemia   Essential hypertension   DM (diabetes mellitus) (HCC)   CKD (chronic kidney disease)   Elevated troponin   Anemia   1. Acute bilateral strokes: Etiology possibly embolic of unknown source. Resultant receptive and expressive aphasia, facial asymmetry and right upper extremity weakness. MRI brain 09/26/16: Left MCA infarct. Multiple foci of acute ischemia scattered throughout both cerebral and cerebellar hemispheres. No hemorrhage or mass effect. MRA head: Occlusion of left MCA at the level of the bifurcation. 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 2-D echo: Pending. Carotid Dopplers: Not requested (had MRA neck). LDL 120. A1c: Pending. Was on aspirin 81 MG daily prior to admission, now on 325 MG daily for secondary stroke prophylaxis. Therapies evaluation pending. Neurology consultation appreciated: If TTE is negative, TEE recommended, blood cultures 2 drawn due to some concern for septic emboli & full dose Lovenox adjusted to renal function started given high suspicion for cardioembolic strokes and ID consultation (called). Await stroke team follow-up. Stroke service has changed Lovenox to prophylactic dose. 2.  Acute urinary retention: Unable to work since night of admission. Unable to catheterize due to possible phimosis. Neurology consulted 09/27/16: 3. Type II DM/IDDM with Recurrent hypoglycemia: Discontinue sliding scale  insulin. Monitor CBGs closely. Start diet as soon as speech therapy clears. D5NS IV at 50 mL per hour. Hold Onglyza 4. Elevated troponin: No chest pain reported. Patient found down at home.? Related to acute stroke and renal insufficiency. Follow 2-D echo results. Cardiology consulted for elevated troponin and may need TEE. 5. Essential hypertension: Allow permissive hypertension given recent acute stroke. Holding lisinopril. 6. Hyperlipidemia: LDL 120. Holding Lopid which can be started once diet resumed. Will also need statin therapy. 7. Possible stage III chronic kidney disease: Baseline creatinine not known. Trend creatinine. Given acute urinary retention, check renal ultrasound to rule out obstruction.  8. Anemia: Baseline hemoglobin not known. Presented with hemoglobin of 10.2. May be chronic. Follow CBC in a.m. Consider outpatient workup. 9. Right lung opacity, seen on chest x-ray 09/26/16: Currently no cough, fever or dyspnea reported. Monitor clinically without antibiotics.    DVT prophylaxis: Lovenox Code Status: DO NOT RESUSCITATE >this was discussed in detail by admitting M.D. with patient and family. Family Communication: Discussed in detail with patient's niece at bedside on 12/13. Updated care and answered questions. Disposition Plan: Pending further evaluation, medical stability and therapies input. Possible CIR versus SNF.   Consultants:   Neurology  Cardiology  Urology  Infectious disease  Procedures:   None  Antimicrobials:   None    Subjective: Unable to understand speech secondary to aphasia. Follows some simple commands (i.e. stick out tongue). As per niece at bedside, speech seems to be worse than on initial hospital arrival.  Objective:  Vitals:   09/26/16 2000 09/27/16 0054 09/27/16 0254 09/27/16 0700  BP: 110/100  (!) 145/56 (!) 145/64  Pulse: 96 98 93 87  Resp: 19 19 19 19   Temp:  100.1 F (37.8 C) 99.1 F (37.3 C) 99.4 F (37.4 C)  TempSrc:   Oral Oral Axillary  SpO2: 96% 96% 99% 98%  Weight:  72.3 kg (159 lb 4.8 oz)    Height:  5\' 10"  (1.778 m)      Intake/Output Summary (Last 24 hours) at 09/27/16 1105 Last data filed at 09/27/16 0400  Gross per 24 hour  Intake              100 ml  Output                0 ml  Net              100 ml   Filed Weights   09/26/16 1819 09/27/16 0054  Weight: 72.6 kg (160 lb) 72.3 kg (159 lb 4.8 oz)    Examination:  General exam: Pleasant elderly male lying comfortably propped up in bed. Oral mucosa mildly dry. Respiratory system: Clear to auscultation. Respiratory effort normal. Cardiovascular system: S1 & S2 heard, RRR. No JVD, murmurs, rubs, gallops or clicks. No pedal edema. Telemetry: Sinus rhythm. Occasional sinus tachycardia in the low 100s. Gastrointestinal system: Abdomen is nondistended, soft and nontender. No organomegaly or masses felt. Normal bowel sounds heard. Central nervous system: Alert and unable to assess orientation secondary to speech difficulties. Follows simple instructions at times. Receptive and expressive aphasia. Mild dysarthria. Facial asymmetry with decreased prominence of right nasolabial fold.  Extremities: Right upper extremity grade 4 x 5 power with pronator drift. Other extremities at least grade 4+ by 5 power. Skin: Bruising of dorsum of right hand which appears old. Psychiatry: Judgement and insight -unable to assess at this time due to speech difficulties.     Data Reviewed: I have personally reviewed following labs and imaging studies  CBC:  Recent Labs Lab 09/26/16 1834  HGB 10.2*  HCT 30.0*   Basic Metabolic Panel:  Recent Labs Lab 09/26/16 1800 09/26/16 1834  NA 138 138  K 3.7 3.8  CL 105 105  CO2 21*  --   GLUCOSE 89 85  BUN 18 20  CREATININE 1.62* 1.60*  CALCIUM 9.4  --    GFR: Estimated Creatinine Clearance: 40.8 mL/min (by C-G formula based on SCr of 1.6 mg/dL (H)). Liver Function Tests:  Recent Labs Lab 09/26/16 1800   AST 31  ALT 15*  ALKPHOS 135*  BILITOT 1.1  PROT 6.7  ALBUMIN 3.3*   No results for input(s): LIPASE, AMYLASE in the last 168 hours. No results for input(s): AMMONIA in the last 168 hours. Coagulation Profile: No results for input(s): INR, PROTIME in the last 168 hours. Cardiac Enzymes:  Recent Labs Lab 09/27/16 0317 09/27/16 0810  TROPONINI 0.62* 0.64*   BNP (last 3 results) No results for input(s): PROBNP in the last 8760 hours. HbA1C: No results for input(s): HGBA1C in the last 72 hours. CBG:  Recent Labs Lab 09/27/16 0253 09/27/16 0328 09/27/16 0810 09/27/16 0908  GLUCAP 38* 152* 62* 109*   Lipid Profile:  Recent Labs  09/27/16 0317  CHOL 173  HDL 33*  LDLCALC 120*  TRIG 102  CHOLHDL 5.2   Thyroid Function Tests: No results for input(s): TSH, T4TOTAL, FREET4, T3FREE, THYROIDAB in the last 72 hours. Anemia Panel: No results for input(s): VITAMINB12, FOLATE, FERRITIN, TIBC, IRON, RETICCTPCT in the last 72 hours.  Sepsis Labs:  Recent Labs Lab 09/26/16 1834  LATICACIDVEN 1.28    Recent Results (from the past 240 hour(s))  MRSA PCR Screening  Status: None   Collection Time: 09/27/16  3:40 AM  Result Value Ref Range Status   MRSA by PCR NEGATIVE NEGATIVE Final    Comment:        The GeneXpert MRSA Assay (FDA approved for NASAL specimens only), is one component of a comprehensive MRSA colonization surveillance program. It is not intended to diagnose MRSA infection nor to guide or monitor treatment for MRSA infections.          Radiology Studies: Dg Chest 2 View  Result Date: 09/26/2016 CLINICAL DATA:  Altered mental status. EXAM: CHEST  2 VIEW COMPARISON:  None. FINDINGS: Mild cardiomegaly is noted. No pneumothorax or pleural effusion is noted. Left lung is clear. Ill-defined opacity is seen in the right lung concerning for possible pneumonia or edema. Bony thorax is unremarkable. IMPRESSION: Ill-defined opacity seen in right lung  concerning for possible pneumonia or edema. Electronically Signed   By: Lupita Raider, M.D.   On: 09/26/2016 19:08   Ct Head Wo Contrast  Result Date: 09/26/2016 CLINICAL DATA:  Found down on floor today.  Aphasia EXAM: CT HEAD WITHOUT CONTRAST TECHNIQUE: Contiguous axial images were obtained from the base of the skull through the vertex without intravenous contrast. COMPARISON:  None. FINDINGS: Brain: There is no acute hemorrhage. No focal mass lesion. Suspected loss of the gray-white matter differentiation involving the left insula, inferior frontal, and anterior temporal lobes. Moderate atrophy. Ventricles slightly enlarged but felt secondary to atrophy. Vascular: Carotid artery calcifications. Questionable mildly dense distal left MCA. Skull: Mastoid air cells are clear. No fracture. Prominent mandibular condyles with degenerative changes. Sinuses/Orbits: No acute fluid levels in the sinuses. Mild mucosal thickening in the ethmoid sinuses. No acute orbital abnormality. Other: None IMPRESSION: 1. Suspect edema within the left insular cortex, anterior temporal lobe and left inferior frontal lobe. MRI is recommended for further evaluation. Possible mildly dense distal left MCA artery. 2. No hemorrhage. 3. Atrophy Electronically Signed   By: Jasmine Pang M.D.   On: 09/26/2016 21:04   Mr Angiogram Head Wo Contrast  Result Date: 09/27/2016 CLINICAL DATA:  Right-sided paralysis and aphasia. EXAM: MRI HEAD WITHOUT AND WITH CONTRAST MRA HEAD WITHOUT CONTRAST MRA NECK WITHOUT AND WITH CONTRAST TECHNIQUE: Multiplanar, multiecho pulse sequences of the brain and surrounding structures were obtained without and with intravenous contrast. Angiographic images of the Circle of Willis were obtained using MRA technique without intravenous contrast. Angiographic images of the neck were obtained using MRA technique without and with intravenous contrast. Carotid stenosis measurements (when applicable) are obtained  utilizing NASCET criteria, using the distal internal carotid diameter as the denominator. CONTRAST:  15 mL MultiHance IV COMPARISON:  Head CT 09/26/2016 FINDINGS: MRI HEAD FINDINGS Brain: The midline structures are normal. There is extensive diffusion restriction in the left insular cortex and frontal operculum. There are additional small foci of diffusion restriction scattered throughout both cerebral hemispheres and both sides of the cerebellum. There is no midline shift or significant mass effect. No evidence of acute hemorrhage. There is multifocal hyperintense T2-weighted signal within the periventricular white matter, most often seen in the setting of chronic microvascular ischemia. No hydrocephalus or extra-axial fluid collection. No lobar predominant atrophy pattern. There are areas of contrast enhancement within the cerebellum. Within the peripheral aspect of the left hemisphere, in the area of peripheral contrast enhancement measures 1.9 x 1.1 cm. A smaller focus near the cerebellar midline measures 4 mm. There is an additional focus of contrast enhancement along the right precentral gyrus.  Vascular: Major intracranial arterial and venous sinus flow voids are preserved. Area of hemosiderin deposition in the left cerebellar hemisphere. There is focal susceptibility in the location of the left MCA bifurcation, which corresponds to the occlusion demonstrated on time-of-flight images. Skull and upper cervical spine: The visualized skull base, calvarium, upper cervical spine and extracranial soft tissues are normal. Sinuses/Orbits: No fluid levels or advanced mucosal thickening. No mastoid effusion. Normal orbits. MRA HEAD FINDINGS Intracranial internal carotid arteries: Normal. Anterior cerebral arteries: Normal. Middle cerebral arteries: There is occlusion of the left middle cerebral artery at the site of the bifurcation. There is minimal flow related enhancement seen within the distal left MCA distribution.  The right MCA is normal. Posterior communicating arteries: Present bilaterally. Posterior cerebral arteries: Normal. Basilar artery: Normal. Vertebral arteries: Codominant. Normal. Superior cerebellar arteries: Normal. Anterior inferior cerebellar arteries: Normal. Posterior inferior cerebellar arteries: Normal. MRA NECK FINDINGS There is a normal 3 vessel aortic arch branch pattern. The bilateral subclavian arteries are normal. There is approximately 30% stenosis at the proximal left internal carotid artery. No right carotid stenosis. The cervical courses of both vertebral arteries are normal. IMPRESSION: 1. 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 Multiple other punctate foci of acute ischemia scattered throughout both cerebral and cerebellar hemispheres suggest a central embolic process. No hemorrhage or mass effect. 2. 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 3. Multiple foci of contrast enhancement within the cerebellum and right frontal lobe, with the largest focus measuring up to 2 cm. Given the other findings, these findings may be secondary to subacute ischemia. However, follow-up imaging (MRI brain with and without contrast) is recommended in 4-6 weeks to exclude the possibility of a neoplastic process, primarily metastatic disease. 4. 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 Otherwise normal MRA of the neck. Critical Value/emergent results were called by telephone at the time of interpretation on 09/27/2016 at 12:08 am to [facility], [facility] who verbally acknowledged these results. Electronically Signed   By: Deatra Robinson M.D.   On: 09/27/2016 00:18   Mr Angiogram Neck W Or Wo Contrast  Result Date: 09/27/2016 CLINICAL DATA:  Right-sided paralysis and aphasia. EXAM: MRI HEAD WITHOUT AND WITH CONTRAST MRA HEAD WITHOUT CONTRAST MRA NECK WITHOUT AND WITH CONTRAST TECHNIQUE: Multiplanar, multiecho pulse sequences of the brain  and surrounding structures were obtained without and with intravenous contrast. Angiographic images of the Circle of Willis were obtained using MRA technique without intravenous contrast. Angiographic images of the neck were obtained using MRA technique without and with intravenous contrast. Carotid stenosis measurements (when applicable) are obtained utilizing NASCET criteria, using the distal internal carotid diameter as the denominator. CONTRAST:  15 mL MultiHance IV COMPARISON:  Head CT 09/26/2016 FINDINGS: MRI HEAD FINDINGS Brain: The midline structures are normal. There is extensive diffusion restriction in the left insular cortex and frontal operculum. There are additional small foci of diffusion restriction scattered throughout both cerebral hemispheres and both sides of the cerebellum. There is no midline shift or significant mass effect. No evidence of acute hemorrhage. There is multifocal hyperintense T2-weighted signal within the periventricular white matter, most often seen in the setting of chronic microvascular ischemia. No hydrocephalus or extra-axial fluid collection. No lobar predominant atrophy pattern. There are areas of contrast enhancement within the cerebellum. Within the peripheral aspect of the left hemisphere, in the area of peripheral contrast enhancement measures 1.9 x 1.1 cm. A smaller focus near the cerebellar  midline measures 4 mm. There is an additional focus of contrast enhancement along the right precentral gyrus. Vascular: Major intracranial arterial and venous sinus flow voids are preserved. Area of hemosiderin deposition in the left cerebellar hemisphere. There is focal susceptibility in the location of the left MCA bifurcation, which corresponds to the occlusion demonstrated on time-of-flight images. Skull and upper cervical spine: The visualized skull base, calvarium, upper cervical spine and extracranial soft tissues are normal. Sinuses/Orbits: No fluid levels or advanced  mucosal thickening. No mastoid effusion. Normal orbits. MRA HEAD FINDINGS Intracranial internal carotid arteries: Normal. Anterior cerebral arteries: Normal. Middle cerebral arteries: There is occlusion of the left middle cerebral artery at the site of the bifurcation. There is minimal flow related enhancement seen within the distal left MCA distribution. The right MCA is normal. Posterior communicating arteries: Present bilaterally. Posterior cerebral arteries: Normal. Basilar artery: Normal. Vertebral arteries: Codominant. Normal. Superior cerebellar arteries: Normal. Anterior inferior cerebellar arteries: Normal. Posterior inferior cerebellar arteries: Normal. MRA NECK FINDINGS There is a normal 3 vessel aortic arch branch pattern. The bilateral subclavian arteries are normal. There is approximately 30% stenosis at the proximal left internal carotid artery. No right carotid stenosis. The cervical courses of both vertebral arteries are normal. IMPRESSION: 1. 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 Multiple other punctate foci of acute ischemia scattered throughout both cerebral and cerebellar hemispheres suggest a central embolic process. No hemorrhage or mass effect. 2. 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 3. Multiple foci of contrast enhancement within the cerebellum and right frontal lobe, with the largest focus measuring up to 2 cm. Given the other findings, these findings may be secondary to subacute ischemia. However, follow-up imaging (MRI brain with and without contrast) is recommended in 4-6 weeks to exclude the possibility of a neoplastic process, primarily metastatic disease. 4. 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 Otherwise normal MRA of the neck. Critical Value/emergent results were called by telephone at the time of interpretation on 09/27/2016 at 12:08 am to [facility] who verbally acknowledged these  results. Electronically Signed   By: Deatra RobinsonKevin  Herman M.D.   On: 09/27/2016 00:18   Mr Laqueta JeanBrain W ZOWo Contrast  Result Date: 09/27/2016 CLINICAL DATA:  Right-sided paralysis and aphasia. EXAM: MRI HEAD WITHOUT AND WITH CONTRAST MRA HEAD WITHOUT CONTRAST MRA NECK WITHOUT AND WITH CONTRAST TECHNIQUE: Multiplanar, multiecho pulse sequences of the brain and surrounding structures were obtained without and with intravenous contrast. Angiographic images of the Circle of Willis were obtained using MRA technique without intravenous contrast. Angiographic images of the neck were obtained using MRA technique without and with intravenous contrast. Carotid stenosis measurements (when applicable) are obtained utilizing NASCET criteria, using the distal internal carotid diameter as the denominator. CONTRAST:  15 mL MultiHance IV COMPARISON:  Head CT 09/26/2016 FINDINGS: MRI HEAD FINDINGS Brain: The midline structures are normal. There is extensive diffusion restriction in the left insular cortex and frontal operculum. There are additional small foci of diffusion restriction scattered throughout both cerebral hemispheres and both sides of the cerebellum. There is no midline shift or significant mass effect. No evidence of acute hemorrhage. There is multifocal hyperintense T2-weighted signal within the periventricular white matter, most often seen in the setting of chronic microvascular ischemia. No hydrocephalus or extra-axial fluid collection. No lobar predominant atrophy pattern. There are areas of contrast enhancement within the cerebellum. Within the peripheral aspect of the left hemisphere, in the area  of peripheral contrast enhancement measures 1.9 x 1.1 cm. A smaller focus near the cerebellar midline measures 4 mm. There is an additional focus of contrast enhancement along the right precentral gyrus. Vascular: Major intracranial arterial and venous sinus flow voids are preserved. Area of hemosiderin deposition in the left  cerebellar hemisphere. There is focal susceptibility in the location of the left MCA bifurcation, which corresponds to the occlusion demonstrated on time-of-flight images. Skull and upper cervical spine: The visualized skull base, calvarium, upper cervical spine and extracranial soft tissues are normal. Sinuses/Orbits: No fluid levels or advanced mucosal thickening. No mastoid effusion. Normal orbits. MRA HEAD FINDINGS Intracranial internal carotid arteries: Normal. Anterior cerebral arteries: Normal. Middle cerebral arteries: There is occlusion of the left middle cerebral artery at the site of the bifurcation. There is minimal flow related enhancement seen within the distal left MCA distribution. The right MCA is normal. Posterior communicating arteries: Present bilaterally. Posterior cerebral arteries: Normal. Basilar artery: Normal. Vertebral arteries: Codominant. Normal. Superior cerebellar arteries: Normal. Anterior inferior cerebellar arteries: Normal. Posterior inferior cerebellar arteries: Normal. MRA NECK FINDINGS There is a normal 3 vessel aortic arch branch pattern. The bilateral subclavian arteries are normal. There is approximately 30% stenosis at the proximal left internal carotid artery. No right carotid stenosis. The cervical courses of both vertebral arteries are normal. IMPRESSION: 1. 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 Multiple other punctate foci of acute ischemia scattered throughout both cerebral and cerebellar hemispheres suggest a central embolic process. No hemorrhage or mass effect. 2. 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 3. Multiple foci of contrast enhancement within the cerebellum and right frontal lobe, with the largest focus measuring up to 2 cm. Given the other findings, these findings may be secondary to subacute ischemia. However, follow-up imaging (MRI brain with and without contrast) is recommended in 4-6 weeks to exclude  the possibility of a neoplastic process, primarily metastatic disease. 4. 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 Otherwise normal MRA of the neck. Critical Value/emergent results were called by telephone at the time of interpretation on 09/27/2016 at 12:08 am to [facility], [facility] who verbally acknowledged these results. Electronically Signed   By: Deatra Robinson M.D.   On: 09/27/2016 00:18        Scheduled Meds: .  stroke: mapping our early stages of recovery book   Does not apply Once  . aspirin  300 mg Rectal Daily   Or  . aspirin  325 mg Oral Daily  . enoxaparin (LOVENOX) injection  40 mg Subcutaneous Q24H   Continuous Infusions: . dextrose 5 % and 0.9% NaCl       LOS: 1 day       [facility], MD [facility] Pager [phone]  If 7PM-7AM, please contact night-coverage [email or web address] Password [email or web address] 09/27/2016, 11:05 AM

## 2016-09-27 NOTE — Care Management Note (Signed)
Case Management Note  Patient Details  Name: Douglas [name] MRN: [id number] Date of Birth: 03/21/1941  Subjective/Objective:   Presents with CVA, has expressive ahphasia, per pt eval rec CIR, als will make CSW referral for back up. NCM will cont to follow for dc needs.               Action/Plan:   Expected Discharge Date:                  Expected Discharge Plan:  IP Rehab Facility  In-House Referral:  Clinical Social Work  Discharge planning Services  CM Consult  Post Acute Care Choice:    Choice offered to:     DME Arranged:    DME Agency:     HH Arranged:    HH Agency:     Status of Service:  In process, will continue to follow  If discussed at Long Length of Stay Meetings, dates discussed:    Additional Comments:  Leone Havenaylor, Almadelia Looman Clinton, RN 09/27/2016, 4:54 PM

## 2016-09-27 NOTE — Progress Notes (Signed)
Per night shift RN patient had not voided at all last night, bladder scan preformed at change of shift revealed a total of 450, MD notified and order received to continue to assist patient with voiding and if still unable to void, MD placed order for in and out cath

## 2016-09-27 NOTE — Progress Notes (Signed)
Hypoglycemic Event  CBG: 38  Treatment: D50 IV 50 mL  Symptoms: None  Follow-up CBG: Time:0328 CBG Result:152  Possible Reasons for Event: Inadequate meal intake and Unknown  Comments/MD notified: K. Era BumpersKirby     Michel Hendon J Amaree Leeper

## 2016-09-27 NOTE — Progress Notes (Signed)
CRITICAL VALUE ALERT  Critical value received:  troponin  Date of notification:  09/27/2016  Time of notification:  0415  Critical value read back:Yes.    Nurse who received alert:  Cindra EvesHeather Richard  MD notified (1st page):  Craige CottaKirby  Time of first page:  0420  MD notified (2nd page):  Time of second page:  Responding MD:     Time MD responded:

## 2016-09-27 NOTE — Consult Note (Addendum)
Reason for Consult: elevated troponin   Referring Physician: Dr. Algis Liming   PCP:  Marjorie Smolder, MD  Primary Cardiologist:New   Douglas [name] is an 75 y.o. male.    Chief Complaint: admitted 09/26/16 for  CVA  HPI:   Asked to see 19 YOM for elevated troponin.  He has hx of HTN,  Hyperlipidemia, DM [name].   Pt was found in the floor at his home yesterday -he was awake but unable to speak.  He had rt arm and leg weakness.  CT scan suggested left MCA stroke and MRI was recommended.    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 Multiple other punctate foci of acute ischemia scattered throughout both cerebral and cerebellar hemispheres suggest a central embolic process. No hemorrhage or mass effect.-- Occlusion of the left MCA at the level of the bifurcation.  -- Multiple foci of contrast enhancement within the cerebellum and right frontal lobe, with the largest focus measuring up to 2 cm. Given the other findings, these findings may be secondary to subacute ischemia.   Echo today EF 50-55% mild LVH, mild MR, no cardiac source of emboli.   Currently he denies chest pain before CVA or now with yes and no answers.  + aphasia.  Is resting comfortably from what I understand no hx of heart problems.     Past Medical History:  Diagnosis Date  . CKD (chronic kidney disease)   . DM (diabetes mellitus) ([place])   . Hyperlipidemia   . Hypertension     History reviewed. No pertinent surgical history.  Family History  Problem Relation Age of Onset  . Heart disease Mother   . Cancer Father   . Stroke Brother 63   Social History:  reports that he quit smoking about 34 years ago. He has never used smokeless tobacco. He reports that he drinks alcohol. He reports that he does not use drugs.  Allergies: No Known  Allergies  OUTPATIENT MEDICATIONS:  No current facility-administered medications on file prior to encounter.    Current Outpatient Prescriptions on File Prior to Encounter  Medication Sig Dispense Refill  . levothyroxine (SYNTHROID, LEVOTHROID) 75 MCG tablet Take 75 mcg by mouth daily before breakfast.     . lisinopril (PRINIVIL,ZESTRIL) 20 MG tablet Take 20 mg by mouth daily.    Marland Kitchen aspirin 81 MG tablet Take 81 mg by mouth daily.    . cholecalciferol (VITAMIN D) 1000 UNITS tablet Take 1,000 Units by mouth daily.    . ferrous sulfate 325 (65 FE) MG tablet Take 325 mg by mouth daily with breakfast.     CURRENT MEDICATIONS: Scheduled Meds: . aspirin  300 mg Rectal Daily   Or  . aspirin  325 mg Oral Daily  . [START ON 09/28/2016] enoxaparin (LOVENOX) injection  40 mg Subcutaneous Q24H   Continuous Infusions: . dextrose 5 % and 0.9% NaCl 50 mL/hr at 09/27/16 1045   PRN Meds:.acetaminophen **OR** acetaminophen (TYLENOL) oral liquid 160 mg/5 mL **OR** acetaminophen, senna-docusate   Results for orders placed or performed during the hospital encounter of 09/26/16 (from the past 48 hour(s))  Comprehensive metabolic panel     Status: Abnormal   Collection Time: 09/26/16  6:00 PM  Result Value Ref Range   Sodium 138 135 - 145 mmol/L  Potassium 3.7 3.5 - 5.1 mmol/L   Chloride 105 101 - 111 mmol/L   CO2 21 (L) 22 - 32 mmol/L   Glucose, Bld 89 65 - 99 mg/dL   BUN 18 6 - 20 mg/dL   Creatinine, Ser 1.62 (H) 0.61 - 1.24 mg/dL   Calcium 9.4 8.9 - 10.3 mg/dL   Total Protein 6.7 6.5 - 8.1 g/dL   Albumin 3.3 (L) 3.5 - 5.0 g/dL   AST 31 15 - 41 U/L   ALT 15 (L) 17 - 63 U/L   Alkaline Phosphatase 135 (H) 38 - 126 U/L   Total Bilirubin 1.1 0.3 - 1.2 mg/dL   GFR calc non Af Amer 40 (L) >60 mL/min   GFR calc Af Amer 46 (L) >60 mL/min    Comment: (NOTE) The eGFR has been calculated using the CKD EPI equation. This calculation has not been validated in all clinical situations. eGFR's  persistently <60 mL/min signify possible Chronic Kidney Disease.    Anion gap 12 5 - 15  Brain natriuretic peptide     Status: None   Collection Time: 09/26/16  6:00 PM  Result Value Ref Range   B Natriuretic Peptide 84.4 0.0 - 100.0 pg/mL  I-stat troponin, ED     Status: Abnormal   Collection Time: 09/26/16  6:32 PM  Result Value Ref Range   Troponin i, poc 0.23 (HH) 0.00 - 0.08 ng/mL   Comment NOTIFIED PHYSICIAN    Comment 3            Comment: Due to the release kinetics of cTnI, a negative result within the first hours of the onset of symptoms does not rule out myocardial infarction with certainty. If myocardial infarction is still suspected, repeat the test at appropriate intervals.   I-stat chem 8, ed     Status: Abnormal   Collection Time: 09/26/16  6:34 PM  Result Value Ref Range   Sodium 138 135 - 145 mmol/L   Potassium 3.8 3.5 - 5.1 mmol/L   Chloride 105 101 - 111 mmol/L   BUN 20 6 - 20 mg/dL   Creatinine, Ser 1.60 (H) 0.61 - 1.24 mg/dL   Glucose, Bld 85 65 - 99 mg/dL   Calcium, Ion 1.21 1.15 - 1.40 mmol/L   TCO2 23 0 - 100 mmol/L   Hemoglobin 10.2 (L) 13.0 - 17.0 g/dL   HCT 30.0 (L) 39.0 - 52.0 %  I-Stat CG4 Lactic Acid, ED     Status: None   Collection Time: 09/26/16  6:34 PM  Result Value Ref Range   Lactic Acid, Venous 1.28 0.5 - 1.9 mmol/L  I-stat troponin, ED     Status: Abnormal   Collection Time: 09/26/16  9:30 PM  Result Value Ref Range   Troponin i, poc 0.27 (HH) 0.00 - 0.08 ng/mL   Comment NOTIFIED PHYSICIAN    Comment 3            Comment: Due to the release kinetics of cTnI, a negative result within the first hours of the onset of symptoms does not rule out myocardial infarction with certainty. If myocardial infarction is still suspected, repeat the test at appropriate intervals.   Glucose, capillary     Status: Abnormal   Collection Time: 09/27/16  2:53 AM  Result Value Ref Range   Glucose-Capillary 38 (LL) 65 - 99 mg/dL   Comment 1  Notify RN   Troponin I     Status: Abnormal   Collection Time: 09/27/16  3:17 AM  Result Value Ref Range   Troponin I 0.62 (HH) <0.03 ng/mL    Comment: CRITICAL RESULT CALLED TO, READ BACK BY AND VERIFIED WITH: RICHARDS H,RN 09/27/16 0418 WAYK   Lipid panel     Status: Abnormal   Collection Time: 09/27/16  3:17 AM  Result Value Ref Range   Cholesterol 173 0 - 200 mg/dL   Triglycerides 102 <150 mg/dL   HDL 33 (L) >40 mg/dL   Total CHOL/HDL Ratio 5.2 RATIO   VLDL 20 0 - 40 mg/dL   LDL Cholesterol 120 (H) 0 - 99 mg/dL    Comment:        Total Cholesterol/HDL:CHD Risk Coronary Heart Disease Risk Table                     Men   Women  1/2 Average Risk   3.4   3.3  Average Risk       5.0   4.4  2 X Average Risk   9.6   7.1  3 X Average Risk  23.4   11.0        Use the calculated Patient Ratio above and the CHD Risk Table to determine the patient's CHD Risk.        ATP III CLASSIFICATION (LDL):  <100     mg/dL   Optimal  100-129  mg/dL   Near or Above                    Optimal  130-159  mg/dL   Borderline  160-189  mg/dL   High  >190     mg/dL   Very High   Glucose, capillary     Status: Abnormal   Collection Time: 09/27/16  3:28 AM  Result Value Ref Range   Glucose-Capillary 152 (H) 65 - 99 mg/dL  MRSA PCR Screening     Status: None   Collection Time: 09/27/16  3:40 AM  Result Value Ref Range   MRSA by PCR NEGATIVE NEGATIVE    Comment:        The GeneXpert MRSA Assay (FDA approved for NASAL specimens only), is one component of a comprehensive MRSA colonization surveillance program. It is not intended to diagnose MRSA infection nor to guide or monitor treatment for MRSA infections.   Troponin I     Status: Abnormal   Collection Time: 09/27/16  8:10 AM  Result Value Ref Range   Troponin I 0.64 (HH) <0.03 ng/mL    Comment: CRITICAL VALUE NOTED.  VALUE IS CONSISTENT WITH PREVIOUSLY REPORTED AND CALLED VALUE.  Glucose, capillary     Status: Abnormal   Collection  Time: 09/27/16  8:10 AM  Result Value Ref Range   Glucose-Capillary 62 (L) 65 - 99 mg/dL   Comment 1 Notify RN    Comment 2 Document in Chart   Glucose, capillary     Status: Abnormal   Collection Time: 09/27/16  9:08 AM  Result Value Ref Range   Glucose-Capillary 109 (H) 65 - 99 mg/dL   Comment 1 Notify RN    Comment 2 Document in Chart   Glucose, capillary     Status: None   Collection Time: 09/27/16 12:02 PM  Result Value Ref Range   Glucose-Capillary 91 65 - 99 mg/dL   Comment 1 Notify RN    Comment 2 Document in Chart    Dg Chest 2 View  Result Date: 09/26/2016 CLINICAL DATA:  Altered mental status.  EXAM: CHEST  2 VIEW COMPARISON:  None. FINDINGS: Mild cardiomegaly is noted. No pneumothorax or pleural effusion is noted. Left lung is clear. Ill-defined opacity is seen in the right lung concerning for possible pneumonia or edema. Bony thorax is unremarkable. IMPRESSION: Ill-defined opacity seen in right lung concerning for possible pneumonia or edema. Electronically Signed   By: Marijo Conception, M.D.   On: 09/26/2016 19:08   Ct Head Wo Contrast  Result Date: 09/26/2016 CLINICAL DATA:  Found down on floor today.  Aphasia EXAM: CT HEAD WITHOUT CONTRAST TECHNIQUE: Contiguous axial images were obtained from the base of the skull through the vertex without intravenous contrast. COMPARISON:  None. FINDINGS: Brain: There is no acute hemorrhage. No focal mass lesion. Suspected loss of the gray-white matter differentiation involving the left insula, inferior frontal, and anterior temporal lobes. Moderate atrophy. Ventricles slightly enlarged but felt secondary to atrophy. Vascular: Carotid artery calcifications. Questionable mildly dense distal left MCA. Skull: Mastoid air cells are clear. No fracture. Prominent mandibular condyles with degenerative changes. Sinuses/Orbits: No acute fluid levels in the sinuses. Mild mucosal thickening in the ethmoid sinuses. No acute orbital abnormality. Other:  None IMPRESSION: 1. Suspect edema within the left insular cortex, anterior temporal lobe and left inferior frontal lobe. MRI is recommended for further evaluation. Possible mildly dense distal left MCA artery. 2. No hemorrhage. 3. Atrophy Electronically Signed   By: Donavan Foil M.D.   On: 09/26/2016 21:04   Mr Angiogram Head Wo Contrast  Result Date: 09/27/2016 CLINICAL DATA:  Right-sided paralysis and aphasia. EXAM: MRI HEAD WITHOUT AND WITH CONTRAST MRA HEAD WITHOUT CONTRAST MRA NECK WITHOUT AND WITH CONTRAST TECHNIQUE: Multiplanar, multiecho pulse sequences of the brain and surrounding structures were obtained without and with intravenous contrast. Angiographic images of the Circle of Willis were obtained using MRA technique without intravenous contrast. Angiographic images of the neck were obtained using MRA technique without and with intravenous contrast. Carotid stenosis measurements (when applicable) are obtained utilizing NASCET criteria, using the distal internal carotid diameter as the denominator. CONTRAST:  15 mL MultiHance IV COMPARISON:  Head CT 09/26/2016 FINDINGS: MRI HEAD FINDINGS Brain: The midline structures are normal. There is extensive diffusion restriction in the left insular cortex and frontal operculum. There are additional small foci of diffusion restriction scattered throughout both cerebral hemispheres and both sides of the cerebellum. There is no midline shift or significant mass effect. No evidence of acute hemorrhage. There is multifocal hyperintense T2-weighted signal within the periventricular white matter, most often seen in the setting of chronic microvascular ischemia. No hydrocephalus or extra-axial fluid collection. No lobar predominant atrophy pattern. There are areas of contrast enhancement within the cerebellum. Within the peripheral aspect of the left hemisphere, in the area of peripheral contrast enhancement measures 1.9 x 1.1 cm. A smaller focus near the cerebellar  midline measures 4 mm. There is an additional focus of contrast enhancement along the right precentral gyrus. Vascular: Major intracranial arterial and venous sinus flow voids are preserved. Area of hemosiderin deposition in the left cerebellar hemisphere. There is focal susceptibility in the location of the left MCA bifurcation, which corresponds to the occlusion demonstrated on time-of-flight images. Skull and upper cervical spine: The visualized skull base, calvarium, upper cervical spine and extracranial soft tissues are normal. Sinuses/Orbits: No fluid levels or advanced mucosal thickening. No mastoid effusion. Normal orbits. MRA HEAD FINDINGS Intracranial internal carotid arteries: Normal. Anterior cerebral arteries: Normal. Middle cerebral arteries: There is occlusion of the left middle cerebral artery  at the site of the bifurcation. There is minimal flow related enhancement seen within the distal left MCA distribution. The right MCA is normal. Posterior communicating arteries: Present bilaterally. Posterior cerebral arteries: Normal. Basilar artery: Normal. Vertebral arteries: Codominant. Normal. Superior cerebellar arteries: Normal. Anterior inferior cerebellar arteries: Normal. Posterior inferior cerebellar arteries: Normal. MRA NECK FINDINGS There is a normal 3 vessel aortic arch branch pattern. The bilateral subclavian arteries are normal. There is approximately 30% stenosis at the proximal left internal carotid artery. No right carotid stenosis. The cervical courses of both vertebral arteries are normal. IMPRESSION: 1. 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 Multiple other punctate foci of acute ischemia scattered throughout both cerebral and cerebellar hemispheres suggest a central embolic process. No hemorrhage or mass effect. 2. 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 3. Multiple foci of contrast enhancement within the cerebellum and right frontal  lobe, with the largest focus measuring up to 2 cm. Given the other findings, these findings may be secondary to subacute ischemia. However, follow-up imaging (MRI brain with and without contrast) is recommended in 4-6 weeks to exclude the possibility of a neoplastic process, primarily metastatic disease. 4. 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 Otherwise normal MRA of the neck. Critical Value/emergent results were called by telephone at the time of interpretation on 09/27/2016 at 12:08 am to [facility] [place] who verbally acknowledged these results. Electronically Signed   By: Deatra Robinson M.D.   On: 09/27/2016 00:18   Mr Angiogram Neck W Or Wo Contrast  Result Date: 09/27/2016 CLINICAL DATA:  Right-sided paralysis and aphasia. EXAM: MRI HEAD WITHOUT AND WITH CONTRAST MRA HEAD WITHOUT CONTRAST MRA NECK WITHOUT AND WITH CONTRAST TECHNIQUE: Multiplanar, multiecho pulse sequences of the brain and surrounding structures were obtained without and with intravenous contrast. Angiographic images of the Circle of Willis were obtained using MRA technique without intravenous contrast. Angiographic images of the neck were obtained using MRA technique without and with intravenous contrast. Carotid stenosis measurements (when applicable) are obtained utilizing NASCET criteria, using the distal internal carotid diameter as the denominator. CONTRAST:  15 mL MultiHance IV COMPARISON:  Head CT 09/26/2016 FINDINGS: MRI HEAD FINDINGS Brain: The midline structures are normal. There is extensive diffusion restriction in the left insular cortex and frontal operculum. There are additional small foci of diffusion restriction scattered throughout both cerebral hemispheres and both sides of the cerebellum. There is no midline shift or significant mass effect. No evidence of acute hemorrhage. There is multifocal hyperintense T2-weighted signal within the periventricular white matter, most often seen in  the setting of chronic microvascular ischemia. No hydrocephalus or extra-axial fluid collection. No lobar predominant atrophy pattern. There are areas of contrast enhancement within the cerebellum. Within the peripheral aspect of the left hemisphere, in the area of peripheral contrast enhancement measures 1.9 x 1.1 cm. A smaller focus near the cerebellar midline measures 4 mm. There is an additional focus of contrast enhancement along the right precentral gyrus. Vascular: Major intracranial arterial and venous sinus flow voids are preserved. Area of hemosiderin deposition in the left cerebellar hemisphere. There is focal susceptibility in the location of the left MCA bifurcation, which corresponds to the occlusion demonstrated on time-of-flight images. Skull and upper cervical spine: The visualized skull base, calvarium, upper cervical spine and extracranial soft tissues are normal. Sinuses/Orbits: No fluid levels or advanced mucosal thickening. No mastoid effusion. Normal orbits. MRA HEAD FINDINGS Intracranial internal carotid arteries:  Normal. Anterior cerebral arteries: Normal. Middle cerebral arteries: There is occlusion of the left middle cerebral artery at the site of the bifurcation. There is minimal flow related enhancement seen within the distal left MCA distribution. The right MCA is normal. Posterior communicating arteries: Present bilaterally. Posterior cerebral arteries: Normal. Basilar artery: Normal. Vertebral arteries: Codominant. Normal. Superior cerebellar arteries: Normal. Anterior inferior cerebellar arteries: Normal. Posterior inferior cerebellar arteries: Normal. MRA NECK FINDINGS There is a normal 3 vessel aortic arch branch pattern. The bilateral subclavian arteries are normal. There is approximately 30% stenosis at the proximal left internal carotid artery. No right carotid stenosis. The cervical courses of both vertebral arteries are normal. IMPRESSION: 1. Left MCA infarct involving the  insular cortex and left frontal operculum. Multiple other punctate foci of acute ischemia scattered throughout both cerebral and cerebellar hemispheres suggest a central embolic process. No hemorrhage or mass effect. 2. 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 3. Multiple foci of contrast enhancement within the cerebellum and right frontal lobe, with the largest focus measuring up to 2 cm. Given the other findings, these findings may be secondary to subacute ischemia. However, follow-up imaging (MRI brain with and without contrast) is recommended in 4-6 weeks to exclude the possibility of a neoplastic process, primarily metastatic disease. 4. 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 Otherwise normal MRA of the neck. Critical Value/emergent results were called by telephone at the time of interpretation on 09/27/2016 at 12:08 am to [facility], [place] who verbally acknowledged these results. Electronically Signed   By: Ulyses Jarred M.D.   On: 09/27/2016 00:18   Mr Jeri Cos ME Contrast  Result Date: 09/27/2016 CLINICAL DATA:  Right-sided paralysis and aphasia. EXAM: MRI HEAD WITHOUT AND WITH CONTRAST MRA HEAD WITHOUT CONTRAST MRA NECK WITHOUT AND WITH CONTRAST TECHNIQUE: Multiplanar, multiecho pulse sequences of the brain and surrounding structures were obtained without and with intravenous contrast. Angiographic images of the Circle of Willis were obtained using MRA technique without intravenous contrast. Angiographic images of the neck were obtained using MRA technique without and with intravenous contrast. Carotid stenosis measurements (when applicable) are obtained utilizing NASCET criteria, using the distal internal carotid diameter as the denominator. CONTRAST:  15 mL MultiHance IV COMPARISON:  Head CT 09/26/2016 FINDINGS: MRI HEAD FINDINGS Brain: The midline structures are normal. There is extensive diffusion restriction in the left insular cortex and frontal  operculum. There are additional small foci of diffusion restriction scattered throughout both cerebral hemispheres and both sides of the cerebellum. There is no midline shift or significant mass effect. No evidence of acute hemorrhage. There is multifocal hyperintense T2-weighted signal within the periventricular white matter, most often seen in the setting of chronic microvascular ischemia. No hydrocephalus or extra-axial fluid collection. No lobar predominant atrophy pattern. There are areas of contrast enhancement within the cerebellum. Within the peripheral aspect of the left hemisphere, in the area of peripheral contrast enhancement measures 1.9 x 1.1 cm. A smaller focus near the cerebellar midline measures 4 mm. There is an additional focus of contrast enhancement along the right precentral gyrus. Vascular: Major intracranial arterial and venous sinus flow voids are preserved. Area of hemosiderin deposition in the left cerebellar hemisphere. There is focal susceptibility in the location of the left MCA bifurcation, which corresponds to the occlusion demonstrated on time-of-flight images. Skull and upper cervical spine: The visualized skull base, calvarium, upper cervical spine and extracranial soft tissues are normal. Sinuses/Orbits: No fluid levels or  advanced mucosal thickening. No mastoid effusion. Normal orbits. MRA HEAD FINDINGS Intracranial internal carotid arteries: Normal. Anterior cerebral arteries: Normal. Middle cerebral arteries: There is occlusion of the left middle cerebral artery at the site of the bifurcation. There is minimal flow related enhancement seen within the distal left MCA distribution. The right MCA is normal. Posterior communicating arteries: Present bilaterally. Posterior cerebral arteries: Normal. Basilar artery: Normal. Vertebral arteries: Codominant. Normal. Superior cerebellar arteries: Normal. Anterior inferior cerebellar arteries: Normal. Posterior inferior cerebellar  arteries: Normal. MRA NECK FINDINGS There is a normal 3 vessel aortic arch branch pattern. The bilateral subclavian arteries are normal. There is approximately 30% stenosis at the proximal left internal carotid artery. No right carotid stenosis. The cervical courses of both vertebral arteries are normal. IMPRESSION: 1. 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 Multiple other punctate foci of acute ischemia scattered throughout both cerebral and cerebellar hemispheres suggest a central embolic process. No hemorrhage or mass effect. 2. 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 3. Multiple foci of contrast enhancement within the cerebellum and right frontal lobe, with the largest focus measuring up to 2 cm. Given the other findings, these findings may be secondary to subacute ischemia. However, follow-up imaging (MRI brain with and without contrast) is recommended in 4-6 weeks to exclude the possibility of a neoplastic process, primarily metastatic disease. 4. 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 Otherwise normal MRA of the neck. Critical Value/emergent results were called by telephone at the time of interpretation on 09/27/2016 at 12:08 am to [facility] [place] who verbally acknowledged these results. Electronically Signed   By: Deatra Robinson M.D.   On: 09/27/2016 00:18    ROS: from H&P reviewed with pt but difficult to understand with his aphasia  General:no colds or fevers, no recent weight changes Skin:no rashes or ulcers HEENT:no blurred vision, no congestion, Rt facial droop CV:see HPI PUL:see HPI GI:no diarrhea constipation or melena, no indigestion GU:no hematuria, no dysuria MS:no joint pain, no claudication, Rt side weakness  Neuro:no syncope, no lightheadedness Endo:+ diabetes, + thyroid disease   Blood pressure (!) 167/59, pulse 91, temperature 97.7 F (36.5 C), temperature source Oral, resp. rate 19, height 5'  10" (1.778 m), weight 159 lb 4.8 oz (72.3 kg), SpO2 99 %.  Wt Readings from Last 3 Encounters:  09/27/16 159 lb 4.8 oz (72.3 kg)  05/22/12 165 lb (74.8 kg)    PE: General:Pleasant affect, NAD, sitting up in bed Skin:Warm and dry, brisk capillary refill, bruising on upper ext. HEENT:normocephalic, sclera clear, mucus membranes moist, rt facial droop Neck:supple, no JVD, no bruits  Heart:S1S2 RRR without murmur, gallup, rub or click Lungs:clear without rales, rhonchi, or wheezes VMP:SSXW, non tender, + BS, do not palpate liver spleen or masses Ext:no lower ext edema, 2+ pedal pulses, 2+ radial pulses Neuro:alert and oriented though difficult to eval with aphasia, MAE- rt weaker than lt., follows commands, Rt facial droop Tele:  SR to ST    Assessment/Plan Principal Problem:   Stroke (cerebrum) (HCC) Active Problems:   Hyperlipidemia   Essential hypertension   DM (diabetes mellitus) (HCC)   CKD (chronic kidney disease)   Elevated troponin   Anemia  Elevated troponins- EKG with non specific T wave abnormalities.  This may be due to CVA.  Not sure it is even demand ischemia.  With multiple risk factors for CAD would benefit from outpt nuc once improves from CVA.  Unless  he develops chest pain.   - does say yes to rapid HR at times. ? outpt monitor vs. Linq? Per neuro -Eco with EF 50-55%. - no source of emboli. Mild LVH  Dr. Ellyn Hack to see.  HTN per IM and stroke team.  Hyperlipidemia wit LDL 120 and PAD with carotid disease on MRA once he can take.  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    Cecilie Kicks  Nurse Practitioner Certified [place] Pager [phone] or after 5pm or weekends call [phone] 09/27/2016, 2:09 PM   I have seen, examined and evaluated the patient this PM along with Cecilie Kicks, NP.  After reviewing all the available data and chart, we discussed the patients  laboratory, study & physical findings as well as symptoms in detail. I agree with her findings, examination as well as impression recommendations as per our discussion.    Wasn't unfortunate gentleman who suffered a significant neurologic hit with multiple scattered infarcts. We are consulted due to an abnormal EKG and elevated troponins.  Very unable to tell what the patient's symptoms are, but he seems to decline having any episodes of chest tightness or pressure to suggest ACS. Seemed like he may be having some rapid heartbeats, but cannot tell because he cannot provide information. Exam is relatively benign besides his neurologic exam which I agree above.  Echocardiogram showed relatively stable EF with no source of emboli, with a bubble study. Multiple infarcts would suggest that there is a cardioembolic event.  Cannot exclude A. fib, I would defer to neurology with or not they would prefer having a 30 day event monitor versus a loop recorder. --   With positive troponin levels in setting of stroke, this is probably the most likely etiology and not ACS. This is more the commonly seen occasions were to find levels can be elevated. I also suspect that the T-wave inversions noted on his EKG are associated with the neurologic deficit. With no regional wall motion abnormality noted on echo, I doubt this is of major ACS. Slightly would not anticoagulate regardless because of his recent stroke. He will be on aspirin and likely Plavix for discharge based on neurology's evaluation. As for cardiac evaluation going forward, I discussed loop recorder versus 30 day event monitor as an outpatient, but would defer further recommendations to neurology. We can help have this set up based on the recommendations.  As for the elevated troponin on it would not be unreasonable to consider an outpatient stress test, however at present it would not change our treatment plan. He is not invasive heart cath candidate at  this point with the inability to anticoagulate.  Agree with statin. Close diabetic control per primary service.     Glenetta Hew, M.D., M.S. Interventional Cardiologist   Pager # [phone] Phone # [phone] [address] [place], [place] [address]

## 2016-09-27 NOTE — Consult Note (Signed)
Physical Medicine and Rehabilitation Consult Reason for Consult: Left MCA infarct Referring Physician: [facility]   HPI: Douglas [name] is a 75 y.o. right handed male with history of diabetes mellitus, hypertension, hyperlipidemia, chronic kidney disease with creatinine 1.62. Per chart review patient lives with a roommate who works during the day. One level home with 3 steps to entry. Patient independent prior to admission. Presented 09/26/2016 with aphasia and severe right-sided weakness. CT of the brain shows suspect edema within the left insular cortex, anterior temporal lobe and inferior frontal lobe. No hemorrhage. Troponin mildly elevated 0.64. MRI showed left MCA infarct. Multiple other punctate foci of acute ischemia scattered throughout both cerebral and cerebellar hemispheres. MRA of the head and neck showed occlusion of left MCA at the level of the bifurcation. Patient did not receive TPA. Echocardiogram with ejection fraction of 55% no wall motion abnormalities. 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 Neurology consulted presently on aspirin for CVA prophylaxis. Subcutaneous Lovenox for DVT prophylaxis. Physical and occupational therapy evaluations completed with recommendations of physical medicine rehabilitation consult.   Review of Systems  Unable to perform ROS: Language   Past Medical History:  Diagnosis Date  . CKD (chronic kidney disease)   . DM (diabetes mellitus) (HCC)   . Hyperlipidemia   . Hypertension    History reviewed. No pertinent surgical history. Family History  Problem Relation Age of Onset  . Heart disease Mother   . Cancer Father   . Stroke Brother 28   Social History:  reports that he quit smoking about 34 years ago. He has never used smokeless tobacco. He reports that he drinks alcohol. He reports that he does not use drugs. Allergies: No Known Allergies Medications Prior to Admission  Medication Sig Dispense Refill  . atorvastatin  (LIPITOR) 80 MG tablet Take 80 mg by mouth daily.    . Insulin Glargine (BASAGLAR KWIKPEN) 100 UNIT/ML SOPN Inject 60 Units into the skin daily. Titrate as directed at the same time every day    . levothyroxine (SYNTHROID, LEVOTHROID) 75 MCG tablet Take 75 mcg by mouth daily before breakfast.     . lisinopril (PRINIVIL,ZESTRIL) 20 MG tablet Take 20 mg by mouth daily.    . TRADJENTA 5 MG TABS tablet Take 5 mg by mouth every morning.     Marland Kitchen aspirin 81 MG tablet Take 81 mg by mouth daily.    . cholecalciferol (VITAMIN D) 1000 UNITS tablet Take 1,000 Units by mouth daily.    . ferrous sulfate 325 (65 FE) MG tablet Take 325 mg by mouth daily with breakfast.      Home: Home Living Family/patient expects to be discharged to:: Inpatient rehab Additional Comments: pt lived with a roomate who works during the day. 1 story home with 3-4 steps to enter.   Functional History: Prior Function Level of Independence: Independent Comments: pt didn't use AD, was driving, cooking, bathing, dressing all independently.  Functional Status:  Mobility: Bed Mobility Overal bed mobility: Needs Assistance Bed Mobility: Rolling, Sidelying to Sit, Sit to Supine Rolling: Min assist Sidelying to sit: Mod assist, +2 for physical assistance Sit to supine: Mod assist, +2 for physical assistance General bed mobility comments: max directional verbal and tactile cues to complete task. pt initiated with bringing LEs off EOB but required modA to complete, modA for trunk management in/out of bed Transfers Overall transfer level: Needs assistance Equipment used: 2 person hand held assist Transfers: Sit to/from Stand Sit to Stand:  Min assist, +2 physical assistance General transfer comment: pt initiated powering up but once standing required mod/maxAx2 due to strong R lateral knee and locking out of R knee. Pt HR increased into 130s, pt returned to sitting EOB Ambulation/Gait General Gait Details: unable due to lethargy and  increased HR    ADL: ADL Overall ADL's : Needs assistance/impaired Eating/Feeding: Total assistance Eating/Feeding Details (indicate cue type and reason): Pt very lethargic Grooming: Oral care, Wash/dry face, Maximal assistance, Sitting, Cueing for compensatory techniques Grooming Details (indicate cue type and reason): hand over hand assist given for washing face.  Pt put toothbrush in mouth but unable to prepare brush or actually brush teeth. Upper Body Bathing: Total assistance Lower Body Bathing: Total assistance Upper Body Dressing : Total assistance Lower Body Dressing: Total assistance Toilet Transfer: +2 for physical assistance, Maximal assistance, Stand-pivot, BSC Toileting- Clothing Manipulation and Hygiene: Total assistance, +2 for physical assistance, Sit to/from stand Functional mobility during ADLs: +2 for physical assistance, Maximal assistance General ADL Comments: Pt dependent for most adls at this time.  Pt very lethargic, difficult to keep awake and HR rising with activity.  Given pt's troponin level, pt was returned to bed.  Cognition: Cognition Overall Cognitive Status: Impaired/Different from baseline Orientation Level: Other (comment) (unable to assess) Cognition Arousal/Alertness: Lethargic Behavior During Therapy: Flat affect Overall Cognitive Status: Impaired/Different from baseline Area of Impairment: Following commands, Safety/judgement, Awareness, Problem solving, Attention Current Attention Level: Focused Following Commands: Follows one step commands with increased time, Follows one step commands inconsistently Safety/Judgement: Decreased awareness of safety, Decreased awareness of deficits Awareness: Intellectual Problem Solving: Slow processing, Decreased initiation, Difficulty sequencing, Requires verbal cues, Requires tactile cues General Comments: pt very lethargic but did become more alert s/p 15 min of working with therapy. Pt extremely delayed  processing but did verbalize yes/no questions 25% of time  Blood pressure (!) 167/59, pulse 91, temperature 97.7 F (36.5 C), temperature source Oral, resp. rate 19, height 5\' 10"  (1.778 m), weight 72.3 kg (159 lb 4.8 oz), SpO2 99 %. Physical Exam  Vitals reviewed. Constitutional: He appears well-developed and well-nourished.  HENT:  Head: Normocephalic and atraumatic.  Eyes: Conjunctivae are normal.  Pupils reactive to light  Neck: Normal range of motion. Neck supple. No thyromegaly present.  Cardiovascular: Regular rhythm.   Tachycardia  Respiratory: Effort normal and breath sounds normal.  GI: Soft. Bowel sounds are normal.  Musculoskeletal: He exhibits no edema or tenderness.  Neurological: He is alert.  Right facial weakness Unable to accurately assess motor or sensory, but moving all 4 extremities Not consistently following commands Global aphasia, ?Exp>Rec Dysarthria DTRs 3+ RUE/RLE  Skin: Skin is warm and dry.  Psychiatric:  Unable to assess due to mentation    Results for orders placed or performed during the hospital encounter of 09/26/16 (from the past 24 hour(s))  Comprehensive metabolic panel     Status: Abnormal   Collection Time: 09/26/16  6:00 PM  Result Value Ref Range   Sodium 138 135 - 145 mmol/L   Potassium 3.7 3.5 - 5.1 mmol/L   Chloride 105 101 - 111 mmol/L   CO2 21 (L) 22 - 32 mmol/L   Glucose, Bld 89 65 - 99 mg/dL   BUN 18 6 - 20 mg/dL   Creatinine, Ser 7.821.62 (H) 0.61 - 1.24 mg/dL   Calcium 9.4 8.9 - 95.610.3 mg/dL   Total Protein 6.7 6.5 - 8.1 g/dL   Albumin 3.3 (L) 3.5 - 5.0 g/dL   AST  31 15 - 41 U/L   ALT 15 (L) 17 - 63 U/L   Alkaline Phosphatase 135 (H) 38 - 126 U/L   Total Bilirubin 1.1 0.3 - 1.2 mg/dL   GFR calc non Af Amer 40 (L) >60 mL/min   GFR calc Af Amer 46 (L) >60 mL/min   Anion gap 12 5 - 15  Brain natriuretic peptide     Status: None   Collection Time: 09/26/16  6:00 PM  Result Value Ref Range   B Natriuretic Peptide 84.4 0.0 -  100.0 pg/mL  I-stat troponin, ED     Status: Abnormal   Collection Time: 09/26/16  6:32 PM  Result Value Ref Range   Troponin i, poc 0.23 (HH) 0.00 - 0.08 ng/mL   Comment NOTIFIED PHYSICIAN    Comment 3          I-stat chem 8, ed     Status: Abnormal   Collection Time: 09/26/16  6:34 PM  Result Value Ref Range   Sodium 138 135 - 145 mmol/L   Potassium 3.8 3.5 - 5.1 mmol/L   Chloride 105 101 - 111 mmol/L   BUN 20 6 - 20 mg/dL   Creatinine, Ser 1.61 (H) 0.61 - 1.24 mg/dL   Glucose, Bld 85 65 - 99 mg/dL   Calcium, Ion 0.96 0.45 - 1.40 mmol/L   TCO2 23 0 - 100 mmol/L   Hemoglobin 10.2 (L) 13.0 - 17.0 g/dL   HCT 40.9 (L) 81.1 - 91.4 %  I-Stat CG4 Lactic Acid, ED     Status: None   Collection Time: 09/26/16  6:34 PM  Result Value Ref Range   Lactic Acid, Venous 1.28 0.5 - 1.9 mmol/L  I-stat troponin, ED     Status: Abnormal   Collection Time: 09/26/16  9:30 PM  Result Value Ref Range   Troponin i, poc 0.27 (HH) 0.00 - 0.08 ng/mL   Comment NOTIFIED PHYSICIAN    Comment 3          Glucose, capillary     Status: Abnormal   Collection Time: 09/27/16  2:53 AM  Result Value Ref Range   Glucose-Capillary 38 (LL) 65 - 99 mg/dL   Comment 1 Notify RN   Troponin I     Status: Abnormal   Collection Time: 09/27/16  3:17 AM  Result Value Ref Range   Troponin I 0.62 (HH) <0.03 ng/mL  Lipid panel     Status: Abnormal   Collection Time: 09/27/16  3:17 AM  Result Value Ref Range   Cholesterol 173 0 - 200 mg/dL   Triglycerides 782 <956 mg/dL   HDL 33 (L) >21 mg/dL   Total CHOL/HDL Ratio 5.2 RATIO   VLDL 20 0 - 40 mg/dL   LDL Cholesterol 308 (H) 0 - 99 mg/dL  Glucose, capillary     Status: Abnormal   Collection Time: 09/27/16  3:28 AM  Result Value Ref Range   Glucose-Capillary 152 (H) 65 - 99 mg/dL  MRSA PCR Screening     Status: None   Collection Time: 09/27/16  3:40 AM  Result Value Ref Range   MRSA by PCR NEGATIVE NEGATIVE  Troponin I     Status: Abnormal   Collection Time:  09/27/16  8:10 AM  Result Value Ref Range   Troponin I 0.64 (HH) <0.03 ng/mL  Glucose, capillary     Status: Abnormal   Collection Time: 09/27/16  8:10 AM  Result Value Ref Range   Glucose-Capillary 62 (L)  65 - 99 mg/dL   Comment 1 Notify RN    Comment 2 Document in Chart   Glucose, capillary     Status: Abnormal   Collection Time: 09/27/16  9:08 AM  Result Value Ref Range   Glucose-Capillary 109 (H) 65 - 99 mg/dL   Comment 1 Notify RN    Comment 2 Document in Chart   Glucose, capillary     Status: None   Collection Time: 09/27/16 12:02 PM  Result Value Ref Range   Glucose-Capillary 91 65 - 99 mg/dL   Comment 1 Notify RN    Comment 2 Document in Chart    Dg Chest 2 View  Result Date: 09/26/2016 CLINICAL DATA:  Altered mental status. EXAM: CHEST  2 VIEW COMPARISON:  None. FINDINGS: Mild cardiomegaly is noted. No pneumothorax or pleural effusion is noted. Left lung is clear. Ill-defined opacity is seen in the right lung concerning for possible pneumonia or edema. Bony thorax is unremarkable. IMPRESSION: Ill-defined opacity seen in right lung concerning for possible pneumonia or edema. Electronically Signed   By: Lupita Raider, M.D.   On: 09/26/2016 19:08   Ct Head Wo Contrast  Result Date: 09/26/2016 CLINICAL DATA:  Found down on floor today.  Aphasia EXAM: CT HEAD WITHOUT CONTRAST TECHNIQUE: Contiguous axial images were obtained from the base of the skull through the vertex without intravenous contrast. COMPARISON:  None. FINDINGS: Brain: There is no acute hemorrhage. No focal mass lesion. Suspected loss of the gray-white matter differentiation involving the left insula, inferior frontal, and anterior temporal lobes. Moderate atrophy. Ventricles slightly enlarged but felt secondary to atrophy. Vascular: Carotid artery calcifications. Questionable mildly dense distal left MCA. Skull: Mastoid air cells are clear. No fracture. Prominent mandibular condyles with degenerative changes.  Sinuses/Orbits: No acute fluid levels in the sinuses. Mild mucosal thickening in the ethmoid sinuses. No acute orbital abnormality. Other: None IMPRESSION: 1. Suspect edema within the left insular cortex, anterior temporal lobe and left inferior frontal lobe. MRI is recommended for further evaluation. Possible mildly dense distal left MCA artery. 2. No hemorrhage. 3. Atrophy Electronically Signed   By: Jasmine Pang M.D.   On: 09/26/2016 21:04   Mr Angiogram Head Wo Contrast  Result Date: 09/27/2016 CLINICAL DATA:  Right-sided paralysis and aphasia. EXAM: MRI HEAD WITHOUT AND WITH CONTRAST MRA HEAD WITHOUT CONTRAST MRA NECK WITHOUT AND WITH CONTRAST TECHNIQUE: Multiplanar, multiecho pulse sequences of the brain and surrounding structures were obtained without and with intravenous contrast. Angiographic images of the Circle of Willis were obtained using MRA technique without intravenous contrast. Angiographic images of the neck were obtained using MRA technique without and with intravenous contrast. Carotid stenosis measurements (when applicable) are obtained utilizing NASCET criteria, using the distal internal carotid diameter as the denominator. CONTRAST:  15 mL MultiHance IV COMPARISON:  Head CT 09/26/2016 FINDINGS: MRI HEAD FINDINGS Brain: The midline structures are normal. There is extensive diffusion restriction in the left insular cortex and frontal operculum. There are additional small foci of diffusion restriction scattered throughout both cerebral hemispheres and both sides of the cerebellum. There is no midline shift or significant mass effect. No evidence of acute hemorrhage. There is multifocal hyperintense T2-weighted signal within the periventricular white matter, most often seen in the setting of chronic microvascular ischemia. No hydrocephalus or extra-axial fluid collection. No lobar predominant atrophy pattern. There are areas of contrast enhancement within the cerebellum. Within the  peripheral aspect of the left hemisphere, in the area of peripheral contrast enhancement  measures 1.9 x 1.1 cm. A smaller focus near the cerebellar midline measures 4 mm. There is an additional focus of contrast enhancement along the right precentral gyrus. Vascular: Major intracranial arterial and venous sinus flow voids are preserved. Area of hemosiderin deposition in the left cerebellar hemisphere. There is focal susceptibility in the location of the left MCA bifurcation, which corresponds to the occlusion demonstrated on time-of-flight images. Skull and upper cervical spine: The visualized skull base, calvarium, upper cervical spine and extracranial soft tissues are normal. Sinuses/Orbits: No fluid levels or advanced mucosal thickening. No mastoid effusion. Normal orbits. MRA HEAD FINDINGS Intracranial internal carotid arteries: Normal. Anterior cerebral arteries: Normal. Middle cerebral arteries: There is occlusion of the left middle cerebral artery at the site of the bifurcation. There is minimal flow related enhancement seen within the distal left MCA distribution. The right MCA is normal. Posterior communicating arteries: Present bilaterally. Posterior cerebral arteries: Normal. Basilar artery: Normal. Vertebral arteries: Codominant. Normal. Superior cerebellar arteries: Normal. Anterior inferior cerebellar arteries: Normal. Posterior inferior cerebellar arteries: Normal. MRA NECK FINDINGS There is a normal 3 vessel aortic arch branch pattern. The bilateral subclavian arteries are normal. There is approximately 30% stenosis at the proximal left internal carotid artery. No right carotid stenosis. The cervical courses of both vertebral arteries are normal. IMPRESSION: 1. 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 Multiple other punctate foci of acute ischemia scattered throughout both cerebral and cerebellar hemispheres suggest a central embolic process. No hemorrhage or mass  effect. 2. 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 3. Multiple foci of contrast enhancement within the cerebellum and right frontal lobe, with the largest focus measuring up to 2 cm. Given the other findings, these findings may be secondary to subacute ischemia. However, follow-up imaging (MRI brain with and without contrast) is recommended in 4-6 weeks to exclude the possibility of a neoplastic process, primarily metastatic disease. 4. 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 Otherwise normal MRA of the neck. Critical Value/emergent results were called by telephone at the time of interpretation on 09/27/2016 at 12:08 am to [employer] [place] who verbally acknowledged these results. Electronically Signed   By: Deatra Robinson M.D.   On: 09/27/2016 00:18   Mr Angiogram Neck W Or Wo Contrast  Result Date: 09/27/2016 CLINICAL DATA:  Right-sided paralysis and aphasia. EXAM: MRI HEAD WITHOUT AND WITH CONTRAST MRA HEAD WITHOUT CONTRAST MRA NECK WITHOUT AND WITH CONTRAST TECHNIQUE: Multiplanar, multiecho pulse sequences of the brain and surrounding structures were obtained without and with intravenous contrast. Angiographic images of the Circle of Willis were obtained using MRA technique without intravenous contrast. Angiographic images of the neck were obtained using MRA technique without and with intravenous contrast. Carotid stenosis measurements (when applicable) are obtained utilizing NASCET criteria, using the distal internal carotid diameter as the denominator. CONTRAST:  15 mL MultiHance IV COMPARISON:  Head CT 09/26/2016 FINDINGS: MRI HEAD FINDINGS Brain: The midline structures are normal. There is extensive diffusion restriction in the left insular cortex and frontal operculum. There are additional small foci of diffusion restriction scattered throughout both cerebral hemispheres and both sides of the cerebellum. There is no midline shift or significant mass  effect. No evidence of acute hemorrhage. There is multifocal hyperintense T2-weighted signal within the periventricular white matter, most often seen in the setting of chronic microvascular ischemia. No hydrocephalus or extra-axial fluid collection. No lobar predominant atrophy pattern. There are areas of contrast enhancement within  the cerebellum. Within the peripheral aspect of the left hemisphere, in the area of peripheral contrast enhancement measures 1.9 x 1.1 cm. A smaller focus near the cerebellar midline measures 4 mm. There is an additional focus of contrast enhancement along the right precentral gyrus. Vascular: Major intracranial arterial and venous sinus flow voids are preserved. Area of hemosiderin deposition in the left cerebellar hemisphere. There is focal susceptibility in the location of the left MCA bifurcation, which corresponds to the occlusion demonstrated on time-of-flight images. Skull and upper cervical spine: The visualized skull base, calvarium, upper cervical spine and extracranial soft tissues are normal. Sinuses/Orbits: No fluid levels or advanced mucosal thickening. No mastoid effusion. Normal orbits. MRA HEAD FINDINGS Intracranial internal carotid arteries: Normal. Anterior cerebral arteries: Normal. Middle cerebral arteries: There is occlusion of the left middle cerebral artery at the site of the bifurcation. There is minimal flow related enhancement seen within the distal left MCA distribution. The right MCA is normal. Posterior communicating arteries: Present bilaterally. Posterior cerebral arteries: Normal. Basilar artery: Normal. Vertebral arteries: Codominant. Normal. Superior cerebellar arteries: Normal. Anterior inferior cerebellar arteries: Normal. Posterior inferior cerebellar arteries: Normal. MRA NECK FINDINGS There is a normal 3 vessel aortic arch branch pattern. The bilateral subclavian arteries are normal. There is approximately 30% stenosis at the proximal left internal  carotid artery. No right carotid stenosis. The cervical courses of both vertebral arteries are normal. IMPRESSION: 1. 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 Multiple other punctate foci of acute ischemia scattered throughout both cerebral and cerebellar hemispheres suggest a central embolic process. No hemorrhage or mass effect. 2. 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 3. Multiple foci of contrast enhancement within the cerebellum and right frontal lobe, with the largest focus measuring up to 2 cm. Given the other findings, these findings may be secondary to subacute ischemia. However, follow-up imaging (MRI brain with and without contrast) is recommended in 4-6 weeks to exclude the possibility of a neoplastic process, primarily metastatic disease. 4. 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 Otherwise normal MRA of the neck. Critical Value/emergent results were called by telephone at the time of interpretation on 09/27/2016 at 12:08 am to [facility] [place] who verbally acknowledged these results. Electronically Signed   By: Deatra Robinson M.D.   On: 09/27/2016 00:18   Mr Laqueta Jean ZO Contrast  Result Date: 09/27/2016 CLINICAL DATA:  Right-sided paralysis and aphasia. EXAM: MRI HEAD WITHOUT AND WITH CONTRAST MRA HEAD WITHOUT CONTRAST MRA NECK WITHOUT AND WITH CONTRAST TECHNIQUE: Multiplanar, multiecho pulse sequences of the brain and surrounding structures were obtained without and with intravenous contrast. Angiographic images of the Circle of Willis were obtained using MRA technique without intravenous contrast. Angiographic images of the neck were obtained using MRA technique without and with intravenous contrast. Carotid stenosis measurements (when applicable) are obtained utilizing NASCET criteria, using the distal internal carotid diameter as the denominator. CONTRAST:  15 mL MultiHance IV COMPARISON:  Head CT 09/26/2016  FINDINGS: MRI HEAD FINDINGS Brain: The midline structures are normal. There is extensive diffusion restriction in the left insular cortex and frontal operculum. There are additional small foci of diffusion restriction scattered throughout both cerebral hemispheres and both sides of the cerebellum. There is no midline shift or significant mass effect. No evidence of acute hemorrhage. There is multifocal hyperintense T2-weighted signal within the periventricular white matter, most often seen in the setting of chronic microvascular ischemia. No hydrocephalus or  extra-axial fluid collection. No lobar predominant atrophy pattern. There are areas of contrast enhancement within the cerebellum. Within the peripheral aspect of the left hemisphere, in the area of peripheral contrast enhancement measures 1.9 x 1.1 cm. A smaller focus near the cerebellar midline measures 4 mm. There is an additional focus of contrast enhancement along the right precentral gyrus. Vascular: Major intracranial arterial and venous sinus flow voids are preserved. Area of hemosiderin deposition in the left cerebellar hemisphere. There is focal susceptibility in the location of the left MCA bifurcation, which corresponds to the occlusion demonstrated on time-of-flight images. Skull and upper cervical spine: The visualized skull base, calvarium, upper cervical spine and extracranial soft tissues are normal. Sinuses/Orbits: No fluid levels or advanced mucosal thickening. No mastoid effusion. Normal orbits. MRA HEAD FINDINGS Intracranial internal carotid arteries: Normal. Anterior cerebral arteries: Normal. Middle cerebral arteries: There is occlusion of the left middle cerebral artery at the site of the bifurcation. There is minimal flow related enhancement seen within the distal left MCA distribution. The right MCA is normal. Posterior communicating arteries: Present bilaterally. Posterior cerebral arteries: Normal. Basilar artery: Normal. Vertebral  arteries: Codominant. Normal. Superior cerebellar arteries: Normal. Anterior inferior cerebellar arteries: Normal. Posterior inferior cerebellar arteries: Normal. MRA NECK FINDINGS There is a normal 3 vessel aortic arch branch pattern. The bilateral subclavian arteries are normal. There is approximately 30% stenosis at the proximal left internal carotid artery. No right carotid stenosis. The cervical courses of both vertebral arteries are normal. IMPRESSION: 1. 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 Multiple other punctate foci of acute ischemia scattered throughout both cerebral and cerebellar hemispheres suggest a central embolic process. No hemorrhage or mass effect. 2. 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 3. Multiple foci of contrast enhancement within the cerebellum and right frontal lobe, with the largest focus measuring up to 2 cm. Given the other findings, these findings may be secondary to subacute ischemia. However, follow-up imaging (MRI brain with and without contrast) is recommended in 4-6 weeks to exclude the possibility of a neoplastic process, primarily metastatic disease. 4. 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 Otherwise normal MRA of the neck. Critical Value/emergent results were called by telephone at the time of interpretation on 09/27/2016 at 12:08 am to [facility] Humes, PA who verbally acknowledged these results. Electronically Signed   By: Deatra RobinsonKevin  Herman M.D.   On: 09/27/2016 00:18    Assessment/Plan: Diagnosis: Left MCA infarct Labs and images independently reviewed.  Records reviewed and summated above. Stroke: Continue secondary stroke prophylaxis and Risk Factor Modification listed below:   Antiplatelet therapy:   Blood Pressure Management:  Continue current medication with prn's with permisive HTN per primary team Statin Agent:   ?Right sided hemiparesis: fit for orthosis to prevent  contractures (resting hand splint for day, wrist cock up splint at night, PRAFO, etc) Motor recovery: Fluoxetine  1. Does the need for close, 24 hr/day medical supervision in concert with the patient's rehab needs make it unreasonable for this patient to be served in a less intensive setting? Yes  2. Co-Morbidities requiring supervision/potential complications: diabetes mellitus (Monitor in accordance with exercise and adjust meds as necessary), HTN (monitor and provide prns in accordance with increased physical exertion and pain), hyperlipidemia (cont meds), chronic kidney disease (avoid nephrotoxic meds), systolic CHF (Monitor in accordance with increased physical activity and avoid UE resistance excercises), Tachycardia (monitor in accordance with pain and increasing activity), elevated troponins (  recs per Cardiology) 3. Due to bladder management, safety, disease management, medication administration and patient education, does the patient require 24 hr/day rehab nursing? Yes 4. Does the patient require coordinated care of a physician, rehab nurse, PT (1-2 hrs/day, 5 days/week), OT (1-2 hrs/day, 5 days/week) and SLP (1-2 hrs/day, 5 days/week) to address physical and functional deficits in the context of the above medical diagnosis(es)? Yes Addressing deficits in the following areas: balance, endurance, locomotion, strength, transferring, bowel/bladder control, bathing, dressing, toileting, cognition, speech, language, swallowing and psychosocial support 5. Can the patient actively participate in an intensive therapy program of at least 3 hrs of therapy per day at least 5 days per week? Potentially 6. The potential for patient to make measurable gains while on inpatient rehab is excellent 7. Anticipated functional outcomes upon discharge from inpatient rehab are supervision and min assist  with PT, supervision and min assist with OT, min assist with SLP. 8. Estimated rehab length of stay to reach the  above functional goals is: 18-22 days. 9. Does the patient have adequate social supports and living environment to accommodate these discharge functional goals? Potentially 10. Anticipated D/C setting: Home 11. Anticipated post D/C treatments: HH therapy and Home excercise program 12. Overall Rehab/Functional Prognosis: good  RECOMMENDATIONS: This patient's condition is appropriate for continued rehabilitative care in the following setting: Like CIR if care giver support after completion of medically stable. Patient has agreed to participate in recommended program. Potentially Note that insurance prior authorization may be required for reimbursement for recommended care.  Comment: Rehab Admissions Coordinator to follow up.  Wray Goehring Karis Juba, MD, Nolberto Hanlon 09/27/2016

## 2016-09-28 ENCOUNTER — Inpatient Hospital Stay (HOSPITAL_COMMUNITY): Payer: Medicare Other

## 2016-09-28 DIAGNOSIS — R410 Disorientation, unspecified: Secondary | ICD-10-CM

## 2016-09-28 DIAGNOSIS — R339 Retention of urine, unspecified: Secondary | ICD-10-CM

## 2016-09-28 LAB — BASIC METABOLIC PANEL
ANION GAP: 10 (ref 5–15)
BUN: 19 mg/dL (ref 6–20)
CALCIUM: 8.9 mg/dL (ref 8.9–10.3)
CO2: 21 mmol/L — AB (ref 22–32)
CREATININE: 1.41 mg/dL — AB (ref 0.61–1.24)
Chloride: 104 mmol/L (ref 101–111)
GFR calc Af Amer: 55 mL/min — ABNORMAL LOW (ref >60)
GFR calc non Af Amer: 47 mL/min — ABNORMAL LOW (ref >60)
GLUCOSE: 185 mg/dL — AB (ref 65–99)
Potassium: 4 mmol/L (ref 3.5–5.1)
Sodium: 135 mmol/L (ref 135–145)

## 2016-09-28 LAB — GLUCOSE, CAPILLARY
GLUCOSE-CAPILLARY: 199 mg/dL — AB (ref 65–99)
GLUCOSE-CAPILLARY: 174 mg/dL — AB (ref 65–99)
GLUCOSE-CAPILLARY: 288 mg/dL — AB (ref 65–99)
Glucose-Capillary: 181 mg/dL — ABNORMAL HIGH (ref 65–99)
Glucose-Capillary: 181 mg/dL — ABNORMAL HIGH (ref 65–99)
Glucose-Capillary: 269 mg/dL — ABNORMAL HIGH (ref 65–99)

## 2016-09-28 LAB — CBC WITH DIFFERENTIAL/PLATELET
Basophils Absolute: 0 10*3/uL (ref 0.0–0.1)
Basophils Relative: 0 %
EOS ABS: 0.1 10*3/uL (ref 0.0–0.7)
EOS PCT: 1 %
HCT: 29.1 % — ABNORMAL LOW (ref 39.0–52.0)
Hemoglobin: 9 g/dL — ABNORMAL LOW (ref 13.0–17.0)
Lymphocytes Relative: 13 %
Lymphs Abs: 1.3 10*3/uL (ref 0.7–4.0)
MCH: 23.6 pg — AB (ref 26.0–34.0)
MCHC: 30.9 g/dL (ref 30.0–36.0)
MCV: 76.2 fL — ABNORMAL LOW (ref 78.0–100.0)
MONOS PCT: 9 %
Monocytes Absolute: 0.9 10*3/uL (ref 0.1–1.0)
Neutro Abs: 7.8 10*3/uL — ABNORMAL HIGH (ref 1.7–7.7)
Neutrophils Relative %: 77 %
PLATELETS: 216 10*3/uL (ref 150–400)
RBC: 3.82 MIL/uL — ABNORMAL LOW (ref 4.22–5.81)
RDW: 20.6 % — AB (ref 11.5–15.5)
WBC: 10.1 10*3/uL (ref 4.0–10.5)

## 2016-09-28 LAB — HEMOGLOBIN A1C
Hgb A1c MFr Bld: 7.2 % — ABNORMAL HIGH (ref 4.8–5.6)
Mean Plasma Glucose: 160 mg/dL

## 2016-09-28 MED ORDER — ATORVASTATIN CALCIUM 80 MG PO TABS
80.0000 mg | ORAL_TABLET | Freq: Every day | ORAL | Status: DC
  Administered 2016-09-28 – 2016-10-05 (×7): 80 mg via ORAL
  Filled 2016-09-28 (×7): qty 1

## 2016-09-28 MED ORDER — SODIUM CHLORIDE 0.9 % IV SOLN
INTRAVENOUS | Status: AC
Start: 2016-09-28 — End: 2016-09-28
  Administered 2016-09-28: 15:00:00 via INTRAVENOUS

## 2016-09-28 MED ORDER — LEVOTHYROXINE SODIUM 75 MCG PO TABS
75.0000 ug | ORAL_TABLET | Freq: Every day | ORAL | Status: DC
  Administered 2016-09-30 – 2016-10-05 (×6): 75 ug via ORAL
  Filled 2016-09-28 (×7): qty 1

## 2016-09-28 MED ORDER — VALPROATE SODIUM 500 MG/5ML IV SOLN
750.0000 mg | Freq: Once | INTRAVENOUS | Status: AC
  Administered 2016-09-28: 750 mg via INTRAVENOUS
  Filled 2016-09-28: qty 7.5

## 2016-09-28 MED ORDER — METOPROLOL TARTRATE 12.5 MG HALF TABLET
12.5000 mg | ORAL_TABLET | Freq: Two times a day (BID) | ORAL | Status: DC
  Filled 2016-09-28: qty 1

## 2016-09-28 MED ORDER — HALOPERIDOL LACTATE 5 MG/ML IJ SOLN: 1.0000 mg | Freq: Two times a day (BID) | INTRAMUSCULAR | Status: DC | PRN

## 2016-09-28 MED ORDER — DIVALPROEX SODIUM 500 MG PO DR TAB
500.0000 mg | DELAYED_RELEASE_TABLET | Freq: Every day | ORAL | Status: DC
  Administered 2016-09-30 – 2016-10-04 (×5): 500 mg via ORAL
  Filled 2016-09-28 (×7): qty 1

## 2016-09-28 MED ORDER — METOPROLOL TARTRATE 12.5 MG HALF TABLET
12.5000 mg | ORAL_TABLET | Freq: Four times a day (QID) | ORAL | Status: DC
  Administered 2016-09-29: 12.5 mg via ORAL

## 2016-09-28 NOTE — Procedures (Signed)
EEG report.  Brief clinical history: 75 y.o.malewho presents with aphasia and severe right sided weakness. His roommate found the patient on the floor at home. Last known well was today 09/27/2016 at 12:30 PM. Aphasia noted by EMS. CT head showed hypodensity in the left insular region and medial left temporal lobe, suspicious for subacute ischemic infarction.  Technique: this is a 17 channel routine scalp EEG performed at the bedside with bipolar and monopolar montages arranged in accordance to the international 10/20 system of electrode placement. One channel was dedicated to EKG recording.  No sleep was recorded during the test. No activating procedures performed.  Description: In the wakeful state, the best background consisted of a medium amplitude, posterior dominant, well sustained, symmetric and reactive 8-9Hz  rhythm. Drowsiness demonstrated dropout of the alpha rhythm. No focal or generalized epileptiform discharges noted.  Infrequent intermittent left temporal theta slowing seen.  EKG showed sinus rhythm.  Impression: this is a normal awake and drowsy EEG. The presence of infrequent intermittent left temporal theta slowing is considered a normal finding in this age population. Please, be aware that a normal EEG does not exclude the possibility of epilepsy.  Clinical correlation is advised.   Wyatt Portelasvaldo Kemoni Quesenberry, MD

## 2016-09-28 NOTE — Progress Notes (Signed)
Speech Language Pathology Treatment: Dysphagia  Patient Details Name: Douglas [name] MRN: [id number] DOB: 09/08/1941 Today's Date: 09/28/2016 Time: [id number] SLP Time Calculation (min) (ACUTE ONLY): 14 min  Assessment / Plan / Recommendation Clinical Impression  Pt sitting up in bed and presented ice chip followed by tsp of water. Pt tolerated with no throat clear or cough, however with thin liquid from cup, pt had an immediate strong cough. He was able to masticate and swallow graham cracker with complete oral clearing. Pt continues to tolerate nectar thickened liquids from cup with no s/s of aspiration. RN reported patient tolerated lunch well with assistance for pocketing and ate 75% of meal.  Continue current diet of Dys 2, thin liquids and speech therapy to follow up for diet tolerance and advancement.    HPI HPI: 75 y.o. male with medical history significant of HTN, [name], DM, [name] presenting with a stroke. Pt with noted R sided weakness and expressive aphasia. 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      SLP Plan  Continue with current plan of care     Recommendations  Diet recommendations: Dysphagia 2 (fine chop);Nectar-thick liquid Liquids provided via: Cup Medication Administration: Whole meds with puree Supervision: Full supervision/cueing for compensatory strategies;Staff to assist with self feeding Compensations: Slow rate;Small sips/bites;Lingual sweep for clearance of pocketing;Follow solids with liquid Postural Changes and/or Swallow Maneuvers: Seated upright 90 degrees;Upright 30-60 min after meal               Oral Care Recommendations: Oral care BID Follow up Recommendations: Inpatient Rehab Plan: Continue with current plan of care       GO               Lindalou HoseSarah J. Jarin Cornfield, MA, CCC-SLP 09/28/2016 3:46 PM

## 2016-09-28 NOTE — Progress Notes (Addendum)
Inpatient Rehabilitation  Attempted to meet with patient to discuss team's recommendation for IP Rehab.  Roommate present to share MertonNancy, niece's contact information with me.  Left booklets and plan to follow up with niece later today.  Will follow along for timing of medical readiness, ability to participate in therapies, and support for discharge planning (See consult note for full details).  Please call with questions.   Update: at 1400 spoke with Harriett SineNancy on the phone regarding potential IP Rehab admission.    Charlane FerrettiMelissa Keslee Harrington, M.A., CCC/SLP Admission Coordinator  [facility]  Cell [phone]

## 2016-09-28 NOTE — Progress Notes (Signed)
Physical Therapy Treatment Patient Details Name: Douglas [name] Pirro MRN: [id number] DOB: 08/19/1941 Today's Date: 09/28/2016    History of Present Illness Douglas AlimentJames [name] Aydeletteis a 75 y.o.malewith medical history significant of HTN, [name], DM, [name] presenting with a stroke. Pt with noted R sided weakness and expressive aphasia. 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    PT Comments    Patient progressing with ability to ambulate in room this session.  Still with severe imbalance and high fall risk esp with cognition changes.  Will need SNF level rehab at d/c.  Follow Up Recommendations  SNF;Supervision/Assistance - 24 hour     Equipment Recommendations  Other (comment) (TBA)    Recommendations for Other Services       Precautions / Restrictions Precautions Precautions: Fall Precaution Comments: R sided inattention    Mobility  Bed Mobility Overal bed mobility: Needs Assistance         Sit to supine: Min assist;HOB elevated   General bed mobility comments: able to initiate with assist after two cues (verbal and directional), assist to bring legs off bed  Transfers Overall transfer level: Needs assistance Equipment used: 2 person hand held assist Transfers: Sit to/from Stand;Stand Pivot Transfers Sit to Stand: Min assist;+2 physical assistance Stand pivot transfers: Min assist;+2 safety/equipment;+2 physical assistance       General transfer comment: HR up to 141 with gait in room, but no signs of distress, able to stand with assist for anterior weight shift and cues for safety  Ambulation/Gait Ambulation/Gait assistance: Mod assist;+2 physical assistance Ambulation Distance (Feet): 8 Feet Assistive device: 2 person hand held assist Gait Pattern/deviations: Step-to pattern;Drifts right/left;Narrow base of support;Ataxic     General Gait Details: in room ambulation due to elevated HR and weakness   Stairs             Wheelchair Mobility    Modified Rankin (Stroke Patients Only) Modified Rankin (Stroke Patients Only) Pre-Morbid Rankin Score: No symptoms Modified Rankin: Moderately severe disability     Balance Overall balance assessment: Needs assistance   Sitting balance-Leahy Scale: Fair   Postural control: Posterior lean Standing balance support: Bilateral upper extremity supported Standing balance-Leahy Scale: Poor Standing balance comment: posterior lean at times                    Cognition Arousal/Alertness: Awake/alert Behavior During Therapy: Flat affect Overall Cognitive Status: Impaired/Different from baseline Area of Impairment: Following commands;Problem solving;Orientation Orientation Level: Disoriented to;Situation;Time;Place Current Attention Level: Focused   Following Commands: Follows one step commands with increased time;Follows one step commands inconsistently Safety/Judgement: Decreased awareness of safety;Decreased awareness of deficits   Problem Solving: Slow processing;Decreased initiation;Requires verbal cues;Requires tactile cues      Exercises      General Comments General comments (skin integrity, edema, etc.): patient without restraints at this session, though earlier in 4 pt restraints, has physical sitter,  RN informed left up in chair with sitter present      Pertinent Vitals/Pain Faces Pain Scale: No hurt    Home Living                      Prior Function            PT Goals (current goals can now be found in the care plan section) Progress towards PT goals: Progressing toward goals    Frequency    Min 3X/week      PT Plan Discharge  plan needs to be updated;Frequency needs to be updated    Co-evaluation             End of Session Equipment Utilized During Treatment: Gait belt Activity Tolerance: Patient tolerated treatment well (but with elevated HR) Patient left: in chair;with call bell/phone within  reach;with nursing/sitter in room;with chair alarm set     Time: [id number] PT Time Calculation (min) (ACUTE ONLY): 19 min  Charges:  $Therapeutic Activity: 8-22 mins                    G Codes:      Elray McgregorCynthia Kyler Germer 09/28/2016, 3:19 PM  Sheran Lawlessyndi Bemnet Trovato, PT [phone] 09/28/2016

## 2016-09-28 NOTE — Progress Notes (Signed)
    [facility] has been requested to perform a transesophageal echocardiogram on Douglas [name] for CVA.  After careful review of history and examination, the risks and benefits of transesophageal echocardiogram have been explained including risks of esophageal damage, perforation (1:10,000 risk), bleeding, pharyngeal hematoma as well as other potential complications associated with conscious sedation including aspiration, arrhythmia, respiratory failure and death. Alternatives to treatment were discussed, questions were answered. Patient is willing to proceed.   Nicolasa Duckinghristopher Jarrad Mclees, NP  09/28/2016 6:15 PM

## 2016-09-28 NOTE — Progress Notes (Addendum)
STROKE TEAM PROGRESS NOTE   HISTORY OF PRESENT ILLNESS (per record) Douglas [name] is an 75 y.o. male who presents with aphasia and severe right sided weakness. His roommate found the patient on the floor at home. Last known well was today 09/27/2016 at 12:30 PM. Aphasia noted by EMS. CT head showed hypodensity in the left insular region and medial left temporal lobe, suspicious for subacute ischemic infarction. Increased density within an M2/proximal M3 branch of the left MCA was also seen on CT, suggestive of possible thrombosis. MRI and MRA of brain have been ordered. The patient is out of the IV tPA and 6 hour endovascular time windows.   His PMHx includes CKD, DM, [name] and HTN.  MRI brain reveals multiple subcentimeter foci of restricted diffusion at the cortical juxtacortical interfaces in multiple vascular territories, suspicious for shower emboli/cardioembolic strokes. Also seen is a moderate-size acute ischemic infarction involving the left insula and medial left temporal lobe. The above findings are consistent with completed strokes and there is no indication for CT perfusion study. He was admitted to the ICU stepdown unit for further evaluation and treatment.   SUBJECTIVE (INTERVAL HISTORY) No family present. Agitated over night per RN, difficult to handle. Urology placed foley. No temp spike noted. WBC normal. Blood culture pending   OBJECTIVE Temp:  [97.7 F (36.5 C)-100.5 F (38.1 C)] 97.8 F (36.6 C) (12/14 0745) Pulse Rate:  [91-109] 96 (12/14 0745) Cardiac Rhythm: Sinus tachycardia (12/14 0744) Resp:  [15-24] 20 (12/14 0745) BP: (119-167)/(58-74) 119/58 (12/14 0745) SpO2:  [94 %-100 %] 95 % (12/14 0745)  CBC:   Recent Labs Lab 09/26/16 1834 09/28/16 0414  WBC  --  10.1  NEUTROABS  --  7.8*  HGB 10.2* 9.0*  HCT 30.0* 29.1*  MCV  --  76.2*  PLT  --  216    Basic Metabolic Panel:   Recent Labs Lab 09/26/16 1800 09/26/16 1834 09/28/16 0414  NA 138 138  135  K 3.7 3.8 4.0  CL 105 105 104  CO2 21*  --  21*  GLUCOSE 89 85 185*  BUN 18 20 19   CREATININE 1.62* 1.60* 1.41*  CALCIUM 9.4  --  8.9    Lipid Panel:     Component Value Date/Time   CHOL 173 09/27/2016 0317   TRIG 102 09/27/2016 0317   HDL 33 (L) 09/27/2016 0317   CHOLHDL 5.2 09/27/2016 0317   VLDL 20 09/27/2016 0317   LDLCALC 120 (H) 09/27/2016 0317   HgbA1c:  Lab Results  Component Value Date   HGBA1C 7.2 (H) 09/27/2016   Urine Drug Screen: No results found for: LABOPIA, COCAINSCRNUR, LABBENZ, AMPHETMU, THCU, LABBARB    IMAGING  Dg Chest 2 View 09/26/2016  Ill-defined opacity seen in right lung concerning for possible pneumonia or edema.   Ct Head Wo Contrast 09/26/2016 1. Suspect edema within the left insular cortex, anterior temporal lobe and left inferior frontal lobe. MRI is recommended for further evaluation. Possible mildly dense distal left MCA artery. 2. No hemorrhage. 3. Atrophy   Mr Lodema Pilot Contrast Mr Angiogram Head Wo Contrast Mr Angiogram Neck W Or Wo Contrast 09/27/2016 1. 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 Multiple other punctate foci of acute ischemia scattered throughout both cerebral and cerebellar hemispheres suggest a central embolic process. No hemorrhage or mass effect. 2. 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 3. Multiple foci of contrast enhancement within the cerebellum  and right frontal lobe, with the largest focus measuring up to 2 cm. Given the other findings, these findings may be secondary to subacute ischemia. However, follow-up imaging (MRI brain with and without contrast) is recommended in 4-6 weeks to exclude the possibility of a neoplastic process, primarily metastatic disease. 4. 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 Otherwise normal MRA of the neck.   [place] Renal 09/27/2016  Interval decrease in size of a small cyst at the upper pole  RIGHT kidney. Age-related cortical atrophy without evidence of additional renal mass or hydronephrosis.   2-D echocardiogram  - Left ventricle: Distal septal hypokinesis The cavity size was normal. Wall thickness was increased in a pattern of mild LVH. Systolic function was normal. The estimated ejection fraction was in the range of 50% to 55%. Wall motion was normal; there were no regional wall motion abnormalities. - Mitral valve: There was mild regurgitation. - Atrial septum: No defect or patent foramen ovale was identified. Impressions:   No cardiac source of emboli was indentified.   PHYSICAL EXAM Frail elderly caucasian male not in distress. . Afebrile. Head is nontraumatic. Neck is supple without bruit.    Cardiac exam no murmur or gallop. Lungs are clear to auscultation. Distal pulses are well felt. Neurological Exam :  Drowsy but can be aroused. Globally aphasic can follow only few simple midline and one-step commands. Unable to answer his name. No gaze deviation. But will not follow gaze in either direction. Does not blink to threat from either side. Right lower facial weakness. Tongue midline. Motor system exam shows mild right hemiparesis 4/5 strength with right upper and lower eczema to drift. Moves left side well against gravity and purposefully. Coordination cannot be reliably tested. Gait not tested.  ASSESSMENT/PLAN Douglas [name] is a 75 y.o. male with history of hypertension, hyperlipidemia, diabetes and chronic kidney disease presenting with marked aphasia and right-sided weakness. He did not receive IV t-PA due to delay in arrival.   Stroke:  Left  MCA, as well as scattered bilateral cerebral and cerebellar  Acute and subacute infarcts, embolic secondary to unknown source   MRI  left MCA insular frontal infarct with scattered bilateral cerebral and cerebellar infarcts. Enhancing subacute left cerebellar and right frontal infarcts as well.  MRA head   occlusion of left  MCA bifurcation  MRA neck left ICA 30% stenosis, otherwise normal  2D Echo  EF 50-55%, no source of embolus.   TEE to look for embolic source. Arranged with [facility] for tomorrow.  If positive for PFO (patent foramen ovale), check bilateral lower extremity venous dopplers to rule out DVT as possible source of stroke, also looking for possible endocarditis. (I have made patient NPO after midnight tonight).  Consider loop placement prior to discharge - likely next week. TEE tomorrow. Cardiology notified.   LDL 120  HgbA1c 7.2  On full dose Lovenox for suspected MI. Contraindicated in acute stroke. Changed to Lovenox 40 mg daily for VTE prophylaxis. DIET DYS 2 Room service appropriate? Yes; Fluid consistency: Nectar Thick  aspirin 81 mg daily prior to admission, now on aspirin 300 mg suppository daily  Ongoing aggressive stroke risk factor management  Therapy recommendations:  CIR  Disposition:  pending (lived with non-famliy roommate, not married, no kids. Niece Nok)  Hypertension  Stable  Permissive hypertension (OK if < 220/120) but gradually normalize in 5-7 days  Long-term BP goal normotensive  Hyperlipidemia  Home meds:  lipitor  80 mg daily  Resume statin when able to swallow or tube placed  LDL 120, goal < 70  Continue statin at discharge  Diabetes type II  HgbA1c 7.2 , goal < 7.0  Uncontrolled  Other Stroke Risk Factors  Advanced age  Former Cigarette smoker  ETOH use  Other Active Problems  Elevated troponins in setting of stroke, not thought to be ACS. Cardiology consulted and would not anticoagulate. OP stress test. Discussed loop recorder.  Agitated and resultant sleepiness. Unclear etiology. Check EEG. Add depacon 750 mg IV x 1 followed by depakote ER 500 mg daily.  Urinary retention with incomplete foreskin retraction, urology placed catheter  Hospital day # 2  Rhoderick MoodyBIBY,SHARON  [facility] See Amion for  Pager information 09/28/2016 9:56 AM  I have personally examined this patient, reviewed notes, independently viewed imaging studies, participated in medical decision making and plan of care.ROS completed by me personally and pertinent positives fully documented  I have made any additions or clarifications directly to the above note. Agree with note above. Patient is intermittently agitated and and drowsy examined etiology is unclear. Recommend check EEG for seizure activity and start Depacon both for agitation and possible seizures. Taper and reduce Haldol. Check TEE for endocarditis or cardiac source of embolism. Family not available at bedside for discussion. Discussed with Dr. Waymon AmatoHongalgi and answered questions. Greater than 50% time during this 35 minute visit was spent on counseling and coordination of care about his stroke  Delia HeadyPramod Sethi, MD Medical Director [facility] Pager: [phone] 09/28/2016 12:28 PM   To contact Stroke Continuity provider, please refer to [email or web address]. After hours, contact General Neurology

## 2016-09-28 NOTE — Progress Notes (Signed)
Patient Name: Douglas [name] Date of Encounter: 09/28/2016  [facility]  [facility] Problem List     Principal Problem:   Stroke (cerebrum) [facility](HCC) Active Problems:   Hyperlipidemia   Essential hypertension   DM (diabetes mellitus) (HCC)   CKD (chronic kidney disease)   Elevated troponin   Anemia   Acute embolic stroke (HCC)   Acute systolic heart failure (HCC)   Benign essential HTN   Tachycardia    Subjective   Unable to assess  Inpatient Medications    Scheduled Meds: . aspirin  300 mg Rectal Daily   Or  . aspirin  325 mg Oral Daily  . valproate sodium  750 mg Intravenous Once   Followed by  . [START ON 09/29/2016] divalproex  500 mg Oral Daily  . enoxaparin (LOVENOX) injection  40 mg Subcutaneous Q24H   Continuous Infusions: . sodium chloride     PRN Meds: acetaminophen **OR** acetaminophen (TYLENOL) oral liquid 160 mg/5 mL **OR** acetaminophen, haloperidol lactate, RESOURCE THICKENUP CLEAR, senna-docusate   Vital Signs    Vitals:   09/27/16 2320 09/28/16 0430 09/28/16 0745 09/28/16 1252  BP: (!) 150/68 (!) 143/65 (!) 119/58 (!) 176/87  Pulse: (!) 105 (!) 106 96 (!) 104  Resp: 19 20 20 16   Temp: (!) 100.5 F (38.1 C) 99.2 F (37.3 C) 97.8 F (36.6 C) 98.7 F (37.1 C)  TempSrc: Axillary Oral Axillary Oral  SpO2: 96% 94% 95% 100%  Weight:      Height:        Intake/Output Summary (Last 24 hours) at 09/28/16 1446 Last data filed at 09/28/16 1429  Gross per 24 hour  Intake              449 ml  Output             1150 ml  Net             -701 ml   Filed Weights   09/26/16 1819 09/27/16 0054  Weight: 72.6 kg (160 lb) 72.3 kg (159 lb 4.8 oz)    Physical Exam   GEN: Well nourished, well developed, in no acute distress. Is completely aphasic. Tries to respond to questions, but is unable to. HEENT: Grossly normal.  Neck: Supple, no JVD, carotid bruits, or masses. Cardiac: Tachycardic with a regular rhythm. Unable to really  determine if there is any M/R/G.,. No clubbing, cyanosis, edema.  Radials/DP/PT 2+ and equal bilaterally.  Respiratory:  Respirations regular and unlabored, clear to auscultation bilaterally. GI: Soft, nontender, nondistended, BS + x 4. Psych: Cannot really assess because of minimal ability to respond  Labs    CBC  Recent Labs  09/26/16 1834 09/28/16 0414  WBC  --  10.1  NEUTROABS  --  7.8*  HGB 10.2* 9.0*  HCT 30.0* 29.1*  MCV  --  76.2*  PLT  --  216   Basic Metabolic Panel  Recent Labs  09/26/16 1800 09/26/16 1834 09/28/16 0414  NA 138 138 135  K 3.7 3.8 4.0  CL 105 105 104  CO2 21*  --  21*  GLUCOSE 89 85 185*  BUN 18 20 19   CREATININE 1.62* 1.60* 1.41*  CALCIUM 9.4  --  8.9   Liver Function Tests  Recent Labs  09/26/16 1800  AST 31  ALT 15*  ALKPHOS 135*  BILITOT 1.1  PROT 6.7  ALBUMIN 3.3*   No results for input(s): LIPASE, AMYLASE in the last 72 hours. Cardiac Enzymes  Recent Labs  09/27/16 0317 09/27/16 0810 09/27/16 1607  TROPONINI 0.62* 0.64* 0.54*   BNP Invalid input(s): POCBNP D-Dimer No results for input(s): DDIMER in the last 72 hours. Hemoglobin A1C  Recent Labs  09/27/16 0317  HGBA1C 7.2*   Fasting Lipid Panel  Recent Labs  09/27/16 0317  CHOL 173  HDL 33*  LDLCALC 120*  TRIG 102  CHOLHDL 5.2   Thyroid Function Tests No results for input(s): TSH, T4TOTAL, T3FREE, THYROIDAB in the last 72 hours.  Invalid input(s): FREET3  Telemetry    Sinus tachycardia rate of 90s to 130s. - Personally Reviewed  ECG   Not checked  Radiology    Reviewed MRI reports  Cardiac Studies   Echocardiogram 11/29/2015: EF 50-55%. Negative bubble study. No embolic source noted.  Patient Profile     75-year-old gentleman with a history of hypertension, hyperlipidemia, diabetes and chronic kidney disease who [facility] with acute onset symptoms consistent with a stroke. CT scan suggested left  MCA stroke. As part of his evaluation troponin levels were checked they were positive. We are consulted for positive troponins  Assessment & Plan      Elevated troponin -relatively flat trend. I suspect this is related to his stroke.   Unable to do any further anticoagulation is contraindicated.   He will be on a statin and aspirin per neurology.  If he shows me recovery, would consider the potential for outpatient stress test.    Acute embolic stroke [facility](HCC)  - neurology TEE tomorrow." Cardiology was contacted ", but I found out about it to the note.    Benign essential HTN - allowing for permissive hypertension now.    Tachycardia - sinus tachycardia. He has not really been eating and drinking last 24+ hours. I suspect he is somewhat dehydrated. We'll give him an enema bolus and start very low-dose beta blocker 12.5 mg twice a day    Signed, Bryan Lemmaavid Harding, MD  09/28/2016, 2:46 PM

## 2016-09-28 NOTE — Progress Notes (Signed)
During morning handoff, patient found to be very confused and agitated. Both IV's had been removed, telemetry, and pt was attempting to remove foley as well. Patient was placed in four point restraints for safety and physician, Dr. Waymon AmatoHongalgi was notified of changes. Will continue to monitor.

## 2016-09-28 NOTE — Progress Notes (Addendum)
PROGRESS NOTE  Douglas CohoJames L [name]  WUJ:[id number]:[id number] DOB: 10/13/1941  DOA: 09/26/2016 PCP: Hollice EspyGATES,Douglas RUTH, MD   Brief Narrative:  75 year old male with PMH of HTN, [name], DM, CKD who presented to [facility] on 09/26/16 after his roommate found him in the floor of the living room and with speech difficulties. Last known well 09/26/16  and 12:30 PM. Workup confirmed bilateral embolic looking strokes and elevated troponin. Neurology and cardiology consulted.  Assessment & Plan:   Principal Problem:   Stroke (cerebrum) (HCC) Active Problems:   Hyperlipidemia   Essential hypertension   DM (diabetes mellitus) (HCC)   CKD (chronic kidney disease)   Elevated troponin   Anemia   Acute embolic stroke (HCC)   Acute systolic heart failure (HCC)   Benign essential HTN   Tachycardia   1. Acute bilateral strokes: Left MCA, as well as scattered bilateral cerebral and cerebellar acute and subacute infarcts. Etiology: embolic of unknown source. Resultant receptive and expressive aphasia, facial asymmetry and right upper extremity weakness. MRI brain 09/26/16: Left MCA infarct. Multiple foci of acute ischemia scattered throughout both cerebral and cerebellar hemispheres. No hemorrhage or mass effect. MRA head: Occlusion of left MCA at the level of the bifurcation. 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 2-D echo: EF 50-55 percent, no source of embolus.. Carotid Dopplers: Not requested (had MRA neck). LDL 120. A1c: 7.2. Was on aspirin 81 MG daily prior to admission, now on 325 MG daily for secondary stroke prophylaxis. Therapies evaluation : Recommend CIR. Stroke team follow-up appreciated. Discussed with Dr. Pearlean BrownieSethi> TEE arranged for 12/15 to rule out embolic source. Low index of suspicion for endocarditis unless he spikes a fever or positive blood cultures or TEE for vegetations and hence holding off on ID consultation at this time. EEG requested to rule out seizures and starting on IV  Depakote. 2.  Acute urinary retention: Urology was consulted and underwent Foley catheter placement 12/13. Reassess in a few days with voiding trial when he is close to discharge. 3. Type II DM/IDDM with Recurrent hypoglycemia: Hold Onglyza. Likely due to nothing by mouth. Briefly treated with IV D5 infusion. Hypoglycemia resolved. Discontinued IV fluids. Diet resumed. Monitor closely. If CBGs consistently high, start sensitive SSI. A1c 7.2. 4. Elevated troponin: No chest pain reported. Patient found down at home.? Related to acute stroke and renal insufficiency. 2-D echo results as below. Cardiology consultation appreciated and feels that elevated troponin is due to acute strokes. Outpatient stress test. 5. Essential hypertension: Allow permissive hypertension given recent acute stroke. Holding lisinopril. 6. Hyperlipidemia: LDL 120. Continue statins and Lopid. 7. Possible stage III chronic kidney disease: Baseline creatinine not known. Trend creatinine. Given acute urinary retention, check renal ultrasound to rule out obstruction. Renal ultrasound without hydronephrosis. Creatinine slightly improved to 1.41. Follow BMP in a.m. 8. Anemia: Baseline hemoglobin not known. Presented with hemoglobin of 10.2. May be chronic. Follow CBC in a.m. Consider outpatient workup. Hemoglobin has dropped to 9. No overt bleeding except mild traumatic hematuria. Follow CBC and consider transfusion if hemoglobin <7 g per DL. 9. Right lung opacity, seen on chest x-ray 09/26/16: Currently no cough, fever or dyspnea reported. Monitor clinically without antibiotics. 10. Delirium: Noted on the morning of 09/28/16. Safety sitter, restraints and when necessary Haldol was started. Discussed with neurology who want to rule out seizures and starting Depakote. DC Haldol. Monitor 11. Hypothyroid: Resume home dose of Synthroid. As per pharmacist's home medication reconciliation, it is not known as to which  medications and when he was  taking it prior to admission.    DVT prophylaxis: Lovenox Code Status: DO NOT RESUSCITATE >this was discussed in detail by admitting M.D. with patient and family. Family Communication: None at bedside Disposition Plan: Pending further evaluation, medical stability and therapies input. Possible CIR.   Consultants:   Neurology  Cardiology  Urology  Procedures:   Foley catheter placed by urology on 09/27/16  2-D echo 11/29/15: Study Conclusions  - Left ventricle: Distal septal hypokinesis The cavity size was   normal. Wall thickness was increased in a pattern of mild LVH.   Systolic function was normal. The estimated ejection fraction was   in the range of 50% to 55%. Wall motion was normal; there were no   regional wall motion abnormalities. - Mitral valve: There was mild regurgitation. - Atrial septum: No defect or patent foramen ovale was identified.  Impressions:  - No cardiac source of emboli was indentified.  Antimicrobials:   None    Subjective: As per RN report this morning, patient confused, agitated, attempting to pull out Foley catheter, pulled out IV line and trying to get out of bed. When I visited patient, he was lying comfortably in bed and did not say much. Mumbling speech. Not following instructions.  Objective:  Vitals:   09/27/16 2320 09/28/16 0430 09/28/16 0745 09/28/16 1252  BP: (!) 150/68 (!) 143/65 (!) 119/58 (!) 176/87  Pulse: (!) 105 (!) 106 96 (!) 104  Resp: 19 20 20 16   Temp: (!) 100.5 F (38.1 C) 99.2 F (37.3 C) 97.8 F (36.6 C) 98.7 F (37.1 C)  TempSrc: Axillary Oral Axillary Oral  SpO2: 96% 94% 95% 100%  Weight:      Height:        Intake/Output Summary (Last 24 hours) at 09/28/16 1504 Last data filed at 09/28/16 1429  Gross per 24 hour  Intake              449 ml  Output             1150 ml  Net             -701 ml   Filed Weights   09/26/16 1819 09/27/16 0054  Weight: 72.6 kg (160 lb) 72.3 kg (159 lb 4.8 oz)     Examination:  General exam: Pleasant elderly male lying comfortably propped up in bed.  Respiratory system: Clear to auscultation. Respiratory effort normal. Cardiovascular system: S1 & S2 heard, RRR. No JVD, murmurs, rubs, gallops or clicks. No pedal edema. Telemetry: Sinus rhythm. Occasional sinus tachycardia in the low 100s. Gastrointestinal system: Abdomen is nondistended, soft and nontender. No organomegaly or masses felt. Normal bowel sounds heard. Central nervous system: Alert and unable to assess orientation. Facial asymmetry with decreased prominence of right nasolabial fold.  Extremities: Symmetric 5 x 5 appearing power bilaterally. Is on 4 point restraints this morning. Skin: Bruising of dorsum of right hand which appears old. Psychiatry: Judgement and insight -impaired.     Data Reviewed: I have personally reviewed following labs and imaging studies  CBC:  Recent Labs Lab 09/26/16 1834 09/28/16 0414  WBC  --  10.1  NEUTROABS  --  7.8*  HGB 10.2* 9.0*  HCT 30.0* 29.1*  MCV  --  76.2*  PLT  --  216   Basic Metabolic Panel:  Recent Labs Lab 09/26/16 1800 09/26/16 1834 09/28/16 0414  NA 138 138 135  K 3.7 3.8 4.0  CL 105 105 104  CO2 21*  --  21*  GLUCOSE 89 85 185*  BUN 18 20 19   CREATININE 1.62* 1.60* 1.41*  CALCIUM 9.4  --  8.9   GFR: Estimated Creatinine Clearance: 46.3 mL/min (by C-G formula based on SCr of 1.41 mg/dL (H)). Liver Function Tests:  Recent Labs Lab 09/26/16 1800  AST 31  ALT 15*  ALKPHOS 135*  BILITOT 1.1  PROT 6.7  ALBUMIN 3.3*   No results for input(s): LIPASE, AMYLASE in the last 168 hours. No results for input(s): AMMONIA in the last 168 hours. Coagulation Profile: No results for input(s): INR, PROTIME in the last 168 hours. Cardiac Enzymes:  Recent Labs Lab 09/27/16 0317 09/27/16 0810 09/27/16 1607  TROPONINI 0.62* 0.64* 0.54*   BNP (last 3 results) No results for input(s): PROBNP in the last 8760  hours. HbA1C:  Recent Labs  09/27/16 0317  HGBA1C 7.2*   CBG:  Recent Labs Lab 09/27/16 2102 09/28/16 0016 09/28/16 0422 09/28/16 0856 09/28/16 1351  GLUCAP 218* 181* 181* 199* 174*   Lipid Profile:  Recent Labs  09/27/16 0317  CHOL 173  HDL 33*  LDLCALC 120*  TRIG 102  CHOLHDL 5.2   Thyroid Function Tests: No results for input(s): TSH, T4TOTAL, FREET4, T3FREE, THYROIDAB in the last 72 hours. Anemia Panel: No results for input(s): VITAMINB12, FOLATE, FERRITIN, TIBC, IRON, RETICCTPCT in the last 72 hours.  Sepsis Labs:  Recent Labs Lab 09/26/16 1834  LATICACIDVEN 1.28    Recent Results (from the past 240 hour(s))  MRSA PCR Screening     Status: None   Collection Time: 09/27/16  3:40 AM  Result Value Ref Range Status   MRSA by PCR NEGATIVE NEGATIVE Final    Comment:        The GeneXpert MRSA Assay (FDA approved for NASAL specimens only), is one component of a comprehensive MRSA colonization surveillance program. It is not intended to diagnose MRSA infection nor to guide or monitor treatment for MRSA infections.          Radiology Studies: Dg Chest 2 View  Result Date: 09/26/2016 CLINICAL DATA:  Altered mental status. EXAM: CHEST  2 VIEW COMPARISON:  None. FINDINGS: Mild cardiomegaly is noted. No pneumothorax or pleural effusion is noted. Left lung is clear. Ill-defined opacity is seen in the right lung concerning for possible pneumonia or edema. Bony thorax is unremarkable. IMPRESSION: Ill-defined opacity seen in right lung concerning for possible pneumonia or edema. Electronically Signed   By: Lupita Raider, M.D.   On: 09/26/2016 19:08   Ct Head Wo Contrast  Result Date: 09/26/2016 CLINICAL DATA:  Found down on floor today.  Aphasia EXAM: CT HEAD WITHOUT CONTRAST TECHNIQUE: Contiguous axial images were obtained from the base of the skull through the vertex without intravenous contrast. COMPARISON:  None. FINDINGS: Brain: There is no acute  hemorrhage. No focal mass lesion. Suspected loss of the gray-white matter differentiation involving the left insula, inferior frontal, and anterior temporal lobes. Moderate atrophy. Ventricles slightly enlarged but felt secondary to atrophy. Vascular: Carotid artery calcifications. Questionable mildly dense distal left MCA. Skull: Mastoid air cells are clear. No fracture. Prominent mandibular condyles with degenerative changes. Sinuses/Orbits: No acute fluid levels in the sinuses. Mild mucosal thickening in the ethmoid sinuses. No acute orbital abnormality. Other: None IMPRESSION: 1. Suspect edema within the left insular cortex, anterior temporal lobe and left inferior frontal lobe. MRI is recommended for further evaluation. Possible mildly dense distal left MCA artery. 2. No hemorrhage. 3. Atrophy  Electronically Signed   By: Jasmine Pang M.D.   On: 09/26/2016 21:04   Mr Angiogram Head Wo Contrast  Result Date: 09/27/2016 CLINICAL DATA:  Right-sided paralysis and aphasia. EXAM: MRI HEAD WITHOUT AND WITH CONTRAST MRA HEAD WITHOUT CONTRAST MRA NECK WITHOUT AND WITH CONTRAST TECHNIQUE: Multiplanar, multiecho pulse sequences of the brain and surrounding structures were obtained without and with intravenous contrast. Angiographic images of the Circle of Willis were obtained using MRA technique without intravenous contrast. Angiographic images of the neck were obtained using MRA technique without and with intravenous contrast. Carotid stenosis measurements (when applicable) are obtained utilizing NASCET criteria, using the distal internal carotid diameter as the denominator. CONTRAST:  15 mL MultiHance IV COMPARISON:  Head CT 09/26/2016 FINDINGS: MRI HEAD FINDINGS Brain: The midline structures are normal. There is extensive diffusion restriction in the left insular cortex and frontal operculum. There are additional small foci of diffusion restriction scattered throughout both cerebral hemispheres and both sides of  the cerebellum. There is no midline shift or significant mass effect. No evidence of acute hemorrhage. There is multifocal hyperintense T2-weighted signal within the periventricular white matter, most often seen in the setting of chronic microvascular ischemia. No hydrocephalus or extra-axial fluid collection. No lobar predominant atrophy pattern. There are areas of contrast enhancement within the cerebellum. Within the peripheral aspect of the left hemisphere, in the area of peripheral contrast enhancement measures 1.9 x 1.1 cm. A smaller focus near the cerebellar midline measures 4 mm. There is an additional focus of contrast enhancement along the right precentral gyrus. Vascular: Major intracranial arterial and venous sinus flow voids are preserved. Area of hemosiderin deposition in the left cerebellar hemisphere. There is focal susceptibility in the location of the left MCA bifurcation, which corresponds to the occlusion demonstrated on time-of-flight images. Skull and upper cervical spine: The visualized skull base, calvarium, upper cervical spine and extracranial soft tissues are normal. Sinuses/Orbits: No fluid levels or advanced mucosal thickening. No mastoid effusion. Normal orbits. MRA HEAD FINDINGS Intracranial internal carotid arteries: Normal. Anterior cerebral arteries: Normal. Middle cerebral arteries: There is occlusion of the left middle cerebral artery at the site of the bifurcation. There is minimal flow related enhancement seen within the distal left MCA distribution. The right MCA is normal. Posterior communicating arteries: Present bilaterally. Posterior cerebral arteries: Normal. Basilar artery: Normal. Vertebral arteries: Codominant. Normal. Superior cerebellar arteries: Normal. Anterior inferior cerebellar arteries: Normal. Posterior inferior cerebellar arteries: Normal. MRA NECK FINDINGS There is a normal 3 vessel aortic arch branch pattern. The bilateral subclavian arteries are normal.  There is approximately 30% stenosis at the proximal left internal carotid artery. No right carotid stenosis. The cervical courses of both vertebral arteries are normal. IMPRESSION: 1. 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 Multiple other punctate foci of acute ischemia scattered throughout both cerebral and cerebellar hemispheres suggest a central embolic process. No hemorrhage or mass effect. 2. 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 3. Multiple foci of contrast enhancement within the cerebellum and right frontal lobe, with the largest focus measuring up to 2 cm. Given the other findings, these findings may be secondary to subacute ischemia. However, follow-up imaging (MRI brain with and without contrast) is recommended in 4-6 weeks to exclude the possibility of a neoplastic process, primarily metastatic disease. 4. 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 Otherwise normal MRA of the neck. Critical Value/emergent results were called by telephone at the time of  interpretation on 09/27/2016 at 12:08 am to [facility], [place] who verbally acknowledged these results. Electronically Signed   By: Deatra RobinsonKevin  Herman M.D.   On: 09/27/2016 00:18   Mr Angiogram Neck W Or Wo Contrast  Result Date: 09/27/2016 CLINICAL DATA:  Right-sided paralysis and aphasia. EXAM: MRI HEAD WITHOUT AND WITH CONTRAST MRA HEAD WITHOUT CONTRAST MRA NECK WITHOUT AND WITH CONTRAST TECHNIQUE: Multiplanar, multiecho pulse sequences of the brain and surrounding structures were obtained without and with intravenous contrast. Angiographic images of the Circle of Willis were obtained using MRA technique without intravenous contrast. Angiographic images of the neck were obtained using MRA technique without and with intravenous contrast. Carotid stenosis measurements (when applicable) are obtained utilizing NASCET criteria, using the distal internal carotid diameter as the  denominator. CONTRAST:  15 mL MultiHance IV COMPARISON:  Head CT 09/26/2016 FINDINGS: MRI HEAD FINDINGS Brain: The midline structures are normal. There is extensive diffusion restriction in the left insular cortex and frontal operculum. There are additional small foci of diffusion restriction scattered throughout both cerebral hemispheres and both sides of the cerebellum. There is no midline shift or significant mass effect. No evidence of acute hemorrhage. There is multifocal hyperintense T2-weighted signal within the periventricular white matter, most often seen in the setting of chronic microvascular ischemia. No hydrocephalus or extra-axial fluid collection. No lobar predominant atrophy pattern. There are areas of contrast enhancement within the cerebellum. Within the peripheral aspect of the left hemisphere, in the area of peripheral contrast enhancement measures 1.9 x 1.1 cm. A smaller focus near the cerebellar midline measures 4 mm. There is an additional focus of contrast enhancement along the right precentral gyrus. Vascular: Major intracranial arterial and venous sinus flow voids are preserved. Area of hemosiderin deposition in the left cerebellar hemisphere. There is focal susceptibility in the location of the left MCA bifurcation, which corresponds to the occlusion demonstrated on time-of-flight images. Skull and upper cervical spine: The visualized skull base, calvarium, upper cervical spine and extracranial soft tissues are normal. Sinuses/Orbits: No fluid levels or advanced mucosal thickening. No mastoid effusion. Normal orbits. MRA HEAD FINDINGS Intracranial internal carotid arteries: Normal. Anterior cerebral arteries: Normal. Middle cerebral arteries: There is occlusion of the left middle cerebral artery at the site of the bifurcation. There is minimal flow related enhancement seen within the distal left MCA distribution. The right MCA is normal. Posterior communicating arteries: Present  bilaterally. Posterior cerebral arteries: Normal. Basilar artery: Normal. Vertebral arteries: Codominant. Normal. Superior cerebellar arteries: Normal. Anterior inferior cerebellar arteries: Normal. Posterior inferior cerebellar arteries: Normal. MRA NECK FINDINGS There is a normal 3 vessel aortic arch branch pattern. The bilateral subclavian arteries are normal. There is approximately 30% stenosis at the proximal left internal carotid artery. No right carotid stenosis. The cervical courses of both vertebral arteries are normal. IMPRESSION: 1. 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 Multiple other punctate foci of acute ischemia scattered throughout both cerebral and cerebellar hemispheres suggest a central embolic process. No hemorrhage or mass effect. 2. 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 3. Multiple foci of contrast enhancement within the cerebellum and right frontal lobe, with the largest focus measuring up to 2 cm. Given the other findings, these findings may be secondary to subacute ischemia. However, follow-up imaging (MRI brain with and without contrast) is recommended in 4-6 weeks to exclude the possibility of a neoplastic process, primarily metastatic disease. 4. 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  Otherwise normal MRA of the neck. Critical Value/emergent results were called by telephone at the time of interpretation on 09/27/2016 at 12:08 am to [facility], [place] who verbally acknowledged these results. Electronically Signed   By: Deatra Robinson M.D.   On: 09/27/2016 00:18   Mr Laqueta Jean ZO Contrast  Result Date: 09/27/2016 CLINICAL DATA:  Right-sided paralysis and aphasia. EXAM: MRI HEAD WITHOUT AND WITH CONTRAST MRA HEAD WITHOUT CONTRAST MRA NECK WITHOUT AND WITH CONTRAST TECHNIQUE: Multiplanar, multiecho pulse sequences of the brain and surrounding structures were obtained without and with intravenous  contrast. Angiographic images of the Circle of Willis were obtained using MRA technique without intravenous contrast. Angiographic images of the neck were obtained using MRA technique without and with intravenous contrast. Carotid stenosis measurements (when applicable) are obtained utilizing NASCET criteria, using the distal internal carotid diameter as the denominator. CONTRAST:  15 mL MultiHance IV COMPARISON:  Head CT 09/26/2016 FINDINGS: MRI HEAD FINDINGS Brain: The midline structures are normal. There is extensive diffusion restriction in the left insular cortex and frontal operculum. There are additional small foci of diffusion restriction scattered throughout both cerebral hemispheres and both sides of the cerebellum. There is no midline shift or significant mass effect. No evidence of acute hemorrhage. There is multifocal hyperintense T2-weighted signal within the periventricular white matter, most often seen in the setting of chronic microvascular ischemia. No hydrocephalus or extra-axial fluid collection. No lobar predominant atrophy pattern. There are areas of contrast enhancement within the cerebellum. Within the peripheral aspect of the left hemisphere, in the area of peripheral contrast enhancement measures 1.9 x 1.1 cm. A smaller focus near the cerebellar midline measures 4 mm. There is an additional focus of contrast enhancement along the right precentral gyrus. Vascular: Major intracranial arterial and venous sinus flow voids are preserved. Area of hemosiderin deposition in the left cerebellar hemisphere. There is focal susceptibility in the location of the left MCA bifurcation, which corresponds to the occlusion demonstrated on time-of-flight images. Skull and upper cervical spine: The visualized skull base, calvarium, upper cervical spine and extracranial soft tissues are normal. Sinuses/Orbits: No fluid levels or advanced mucosal thickening. No mastoid effusion. Normal orbits. MRA HEAD FINDINGS  Intracranial internal carotid arteries: Normal. Anterior cerebral arteries: Normal. Middle cerebral arteries: There is occlusion of the left middle cerebral artery at the site of the bifurcation. There is minimal flow related enhancement seen within the distal left MCA distribution. The right MCA is normal. Posterior communicating arteries: Present bilaterally. Posterior cerebral arteries: Normal. Basilar artery: Normal. Vertebral arteries: Codominant. Normal. Superior cerebellar arteries: Normal. Anterior inferior cerebellar arteries: Normal. Posterior inferior cerebellar arteries: Normal. MRA NECK FINDINGS There is a normal 3 vessel aortic arch branch pattern. The bilateral subclavian arteries are normal. There is approximately 30% stenosis at the proximal left internal carotid artery. No right carotid stenosis. The cervical courses of both vertebral arteries are normal. IMPRESSION: 1. 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 Multiple other punctate foci of acute ischemia scattered throughout both cerebral and cerebellar hemispheres suggest a central embolic process. No hemorrhage or mass effect. 2. 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 3. Multiple foci of contrast enhancement within the cerebellum and right frontal lobe, with the largest focus measuring up to 2 cm. Given the other findings, these findings may be secondary to subacute ischemia. However, follow-up imaging (MRI brain with and without contrast) is recommended in 4-6 weeks to exclude the possibility of a neoplastic  process, primarily metastatic disease. 4. 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 Otherwise normal MRA of the neck. Critical Value/emergent results were called by telephone at the time of interpretation on 09/27/2016 at 12:08 am to [facility], [place] who verbally acknowledged these results. Electronically Signed   By: Deatra Robinson M.D.   On: 09/27/2016  00:18   US Renal  Result Date: 09/27/2016 CLINICAL DATA:  Urinary retention, hypertension, diabetes mellitus, chronic kidney disease EXAM: RENAL / URINARY TRACT ULTRASOUND COMPLETE COMPARISON:  Ultrasound abdomen 10/08/2015 FINDINGS: Right Kidney: Length: 5.1 cm. Cortical thinning. Upper normal cortical echogenicity. Small probable cyst at upper pole RIGHT kidney 1.6 x 1.8 x 2.0 cm, measured 2.7 x 1.8 x 2.0 cm on 10/08/2015. No additional mass, hydronephrosis or shadowing calcification. Left Kidney: Length: By 0.7 cm. Diffuse cortical thinning. Normal cortical echogenicity. No mass, hydronephrosis or shadowing calcification. Bladder: Decompressed by Foley catheter, unable to evaluate. IMPRESSION: Interval decrease in size of a small cyst at the upper pole RIGHT kidney. Age-related cortical atrophy without evidence of additional renal mass or hydronephrosis. Electronically Signed   By: Ulyses Southward M.D.   On: 09/27/2016 16:55        Scheduled Meds: . aspirin  300 mg Rectal Daily   Or  . aspirin  325 mg Oral Daily  . valproate sodium  750 mg Intravenous Once   Followed by  . [START ON 09/29/2016] divalproex  500 mg Oral Daily  . enoxaparin (LOVENOX) injection  40 mg Subcutaneous Q24H   Continuous Infusions: . sodium chloride       LOS: 2 days       [facility], MD [facility] Pager [phone]  If 7PM-7AM, please contact night-coverage [email or web address] Password [email or web address] 09/28/2016, 3:04 PM

## 2016-09-28 NOTE — Progress Notes (Signed)
EEG completed; results pending.    

## 2016-09-29 ENCOUNTER — Inpatient Hospital Stay (HOSPITAL_COMMUNITY): Payer: Medicare Other

## 2016-09-29 ENCOUNTER — Encounter (HOSPITAL_COMMUNITY): Payer: Self-pay

## 2016-09-29 ENCOUNTER — Encounter (HOSPITAL_COMMUNITY)
Admission: EM | Disposition: A | Discharge status: Rehabilitation Facility | Payer: Self-pay | Source: Home / Self Care | Attending: Internal Medicine

## 2016-09-29 DIAGNOSIS — G9340 Encephalopathy, unspecified: Secondary | ICD-10-CM

## 2016-09-29 DIAGNOSIS — I63312 Cerebral infarction due to thrombosis of left middle cerebral artery: Secondary | ICD-10-CM

## 2016-09-29 LAB — BASIC METABOLIC PANEL
ANION GAP: 9 (ref 5–15)
BUN: 18 mg/dL (ref 6–20)
CALCIUM: 9.3 mg/dL (ref 8.9–10.3)
CO2: 23 mmol/L (ref 22–32)
CREATININE: 1.29 mg/dL — AB (ref 0.61–1.24)
Chloride: 105 mmol/L (ref 101–111)
GFR, EST NON AFRICAN AMERICAN: 53 mL/min — AB (ref >60)
GLUCOSE: 175 mg/dL — AB (ref 65–99)
Potassium: 3.9 mmol/L (ref 3.5–5.1)
Sodium: 137 mmol/L (ref 135–145)

## 2016-09-29 LAB — DIFFERENTIAL
Basophils Absolute: 0 10*3/uL (ref 0.0–0.1)
Basophils Relative: 0 %
EOS ABS: 0.2 10*3/uL (ref 0.0–0.7)
Eosinophils Relative: 2 %
Lymphocytes Relative: 8 %
Lymphs Abs: 1.1 10*3/uL (ref 0.7–4.0)
Monocytes Absolute: 1.2 10*3/uL — ABNORMAL HIGH (ref 0.1–1.0)
Monocytes Relative: 8 %
Neutro Abs: 12.3 10*3/uL — ABNORMAL HIGH (ref 1.7–7.7)
Neutrophils Relative %: 82 %

## 2016-09-29 LAB — CBC
HCT: 27.9 % — ABNORMAL LOW (ref 39.0–52.0)
Hemoglobin: 8.7 g/dL — ABNORMAL LOW (ref 13.0–17.0)
MCH: 23.9 pg — ABNORMAL LOW (ref 26.0–34.0)
MCHC: 31.2 g/dL (ref 30.0–36.0)
MCV: 76.6 fL — ABNORMAL LOW (ref 78.0–100.0)
PLATELETS: 181 10*3/uL (ref 150–400)
RBC: 3.64 MIL/uL — ABNORMAL LOW (ref 4.22–5.81)
RDW: 20.5 % — AB (ref 11.5–15.5)
WBC: 15.8 10*3/uL — AB (ref 4.0–10.5)

## 2016-09-29 LAB — GLUCOSE, CAPILLARY
GLUCOSE-CAPILLARY: 134 mg/dL — AB (ref 65–99)
GLUCOSE-CAPILLARY: 170 mg/dL — AB (ref 65–99)
Glucose-Capillary: 149 mg/dL — ABNORMAL HIGH (ref 65–99)
Glucose-Capillary: 166 mg/dL — ABNORMAL HIGH (ref 65–99)
Glucose-Capillary: 182 mg/dL — ABNORMAL HIGH (ref 65–99)
Glucose-Capillary: 196 mg/dL — ABNORMAL HIGH (ref 65–99)
Glucose-Capillary: 211 mg/dL — ABNORMAL HIGH (ref 65–99)

## 2016-09-29 LAB — PROCALCITONIN: PROCALCITONIN: 0.27 ng/mL

## 2016-09-29 SURGERY — CANCELLED PROCEDURE

## 2016-09-29 MED ORDER — METOPROLOL TARTRATE 5 MG/5ML IV SOLN
2.5000 mg | Freq: Four times a day (QID) | INTRAVENOUS | Status: DC
  Administered 2016-09-30 – 2016-10-01 (×7): 2.5 mg via INTRAVENOUS
  Filled 2016-09-29 (×7): qty 5

## 2016-09-29 MED ORDER — SODIUM CHLORIDE 0.9 % IV SOLN
INTRAVENOUS | Status: DC
  Administered 2016-09-29: 06:00:00 via INTRAVENOUS
  Administered 2016-09-29: 500 mL via INTRAVENOUS

## 2016-09-29 MED ORDER — SODIUM CHLORIDE 0.9 % IV SOLN
3.0000 g | Freq: Three times a day (TID) | INTRAVENOUS | Status: DC
  Administered 2016-09-29 – 2016-10-02 (×9): 3 g via INTRAVENOUS
  Filled 2016-09-29 (×11): qty 3

## 2016-09-29 MED ORDER — FENTANYL CITRATE (PF) 100 MCG/2ML IJ SOLN
INTRAMUSCULAR | Status: AC
  Filled 2016-09-29: qty 2

## 2016-09-29 MED ORDER — MIDAZOLAM HCL 5 MG/ML IJ SOLN
INTRAMUSCULAR | Status: AC
  Filled 2016-09-29: qty 2

## 2016-09-29 MED ORDER — DIPHENHYDRAMINE HCL 50 MG/ML IJ SOLN
INTRAMUSCULAR | Status: AC
  Filled 2016-09-29: qty 1

## 2016-09-29 NOTE — Progress Notes (Signed)
Pt transported to procedure room at 0851  Pt lethargic but responds to voice commands by opening his eyes. Per admitting RN pt was alert, aphasic, answering simple yes/no questions with nodding of head.  While attaching to monitor pt becomes more lethargic, difficult to arouse by voice and diaphoretic.  Dr. Mayford Knifeurner paged.  Temp 99.8, BS 183.  VSS.  Dr. Mayford Knifeurner to room at 0910, pt O2 sat started dropping , pt non responsive, placed on 100% NRB,  anesthesia called, rapid response called, neuro team called. Evaluated and transported back to unit by rapid response team.  Procedure cancelled.

## 2016-09-29 NOTE — Progress Notes (Signed)
Pharmacy Antibiotic Note  Douglas [name] is a 75 y.o. male admitted on 09/26/2016. Rapid response called earlier today - AMS, respiratory distress, and decrease in O2 saturation. CXR today suspicious for PNA. To start Unasyn for possible aspiration PNA. Tmax 100.1, WBC 10.1>15.8. PCT 0.27. SCr trending down 1.41>1.29. BCx negative to date.   Plan: -Unasyn 3g q8h -Monitor renal fxn, clinical course, de-escalation  Height: 5\' 10"  (177.8 cm) Weight: 159 lb 4.8 oz (72.3 kg) IBW/kg (Calculated) : 73  Temp (24hrs), Avg:98.9 F (37.2 C), Min:97 F (36.1 C), Max:100.1 F (37.8 C)   Recent Labs Lab 09/26/16 1800 09/26/16 1834 09/28/16 0414 09/29/16 0606  WBC  --   --  10.1 15.8*  CREATININE 1.62* 1.60* 1.41* 1.29*  LATICACIDVEN  --  1.28  --   --     Estimated Creatinine Clearance: 50.6 mL/min (by C-G formula based on SCr of 1.29 mg/dL (H)).    No Known Allergies  Thank you for allowing pharmacy to be a part of this patient's care.  Sherle Poeob Vincent, PharmD Clinical Pharmacist 2:07 PM, 09/29/2016

## 2016-09-29 NOTE — Progress Notes (Signed)
PROGRESS NOTE  Osborn CohoJames L Rilling  RUE:[id number]:[id number] DOB: 11/08/1940  DOA: 09/26/2016 PCP: Hollice EspyGATES,DONNA RUTH, MD   Brief Narrative:  75 year old male with PMH of HTN, [name], DM, CKD who [facility] after his roommate found him in the floor of the living room and with speech difficulties. Last known well 09/26/16  and 12:30 PM. Workup confirmed bilateral embolic looking strokes and elevated troponin. Neurology and cardiology consulted.  Assessment & Plan:   Principal Problem:   Stroke (cerebrum) (HCC) Active Problems:   Hyperlipidemia   Essential hypertension   DM (diabetes mellitus) (HCC)   CKD (chronic kidney disease)   Elevated troponin   Anemia   Acute embolic stroke (HCC)   Benign essential HTN   Tachycardia   Urinary retention   Acute delirium   1. Acute bilateral strokes: Left MCA, as well as scattered bilateral cerebral and cerebellar acute and subacute infarcts. Etiology: embolic of unknown source. Resultant receptive and expressive aphasia, facial asymmetry and right upper extremity weakness. MRI brain 09/26/16: Left MCA infarct. Multiple foci of acute ischemia scattered throughout both cerebral and cerebellar hemispheres. No hemorrhage or mass effect. MRA head: Occlusion of left MCA at the level of the bifurcation. 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 2-D echo: EF 50-55 percent, no source of embolus.. Carotid Dopplers: Not requested (had MRA neck). LDL 120. A1c: 7.2. Was on aspirin 81 MG daily prior to admission, now on 325 MG daily for secondary stroke prophylaxis. Therapies evaluation : Recommend CIR. Stroke team follow-up appreciated. Discussed with Dr. Pearlean BrownieSethi> TEE arranged for 12/15 to rule out embolic source. Low index of suspicion for endocarditis unless he spikes a fever or positive blood cultures or TEE for vegetations and hence holding off on ID consultation at this time. Unable to do TEE 12/15 due to acute deterioration-see below. EEG  without reported seizures. Remains on Depakote. Repeat head CT shows left MCA infarct with increase in cytotoxic edema with localized mass effect but no midline shift and other findings as below. Discussed with Dr. Pearlean BrownieSethi, continue treating conservatively as we are over the weekend and if he does not improve then may have to consider palliative care consultation. TEE postponed to Monday. 2. Acute encephalopathy: Multifactorial including acute worsening strokes, rule out infection i.e. aspiration pneumonia and baseline mental status not known. Treat underlying cause and monitor. 3. Apnea/respiratory arrest 12/15: When patient was in endoscopy for TEE, he became somnolent, responsive only to painful stimuli, blood sugar 180, hypoxia and had periods of apnea. Subsequently came around and was more alert, hypoxia resolved. Repeated CT showed worsening as above, likely etiology but getting chest x-ray to rule out pneumonia. Monitor closely and if he declines, may need ICU transfer, intubation and CCM consultation. 4.  Acute urinary retention: Urology was consulted and underwent Foley catheter placement 12/13. Reassess in a few days with voiding trial when he is close to discharge. Patient partially pulled out Foley catheter on 12/15 and had to be replaced. Hematuria secondary to trauma. Monitor. 5. Type II DM/IDDM with Recurrent hypoglycemia: Hold Onglyza. Likely due to nothing by mouth. Briefly treated with IV D5 infusion. Hypoglycemia resolved. Discontinued IV fluids. Diet resumed. Monitor closely. If CBGs consistently high, start sensitive SSI. A1c 7.2. 6. Elevated troponin: No chest pain reported. Patient found down at home.? Related to acute stroke and renal insufficiency. 2-D echo results as below. Cardiology consultation appreciated and feels that elevated troponin is due to acute strokes. Outpatient stress test.  7. Essential hypertension: Allow permissive hypertension given recent acute stroke. Holding  lisinopril. 8. Hyperlipidemia: LDL 120. Continue statins and Lopid. 9. Possible stage III chronic kidney disease: Baseline creatinine not known. Trend creatinine. Given acute urinary retention, check renal ultrasound to rule out obstruction. Renal ultrasound without hydronephrosis. Creatinine slightly improved to 1.29. Follow BMP in a.m. 10. Anemia: Baseline hemoglobin not known. Presented with hemoglobin of 10.2. May be chronic. Consider outpatient workup. Hemoglobin has dropped to 9>8.7. No overt bleeding except traumatic hematuria. Follow CBC and consider transfusion if hemoglobin <7 g per DL. 11. Right lung opacity, seen on chest x-ray 09/26/16: Currently no cough, fever or dyspnea reported. Monitor clinically without antibiotics. 12. Hypothyroid: Resume home dose of Synthroid. As per pharmacist's home medication reconciliation, it is not known as to which medications and when he was taking it prior to admission. 13. Leukocytosis: Rule out aspiration pneumonia. Intermittent mild low grade fevers. Blood cultures drawn on admission 2: Negative to date. Trend CBCs. Check pro calcitonin.    DVT prophylaxis: Lovenox Code Status: DO NOT RESUSCITATE >this was discussed in detail by admitting M.D. with patient and family. Family Communication: None at bedside Disposition Plan: Pending further evaluation, medical stability and therapies input. Possible CIR.   Consultants:   Neurology  Cardiology  Urology  Procedures:   Foley catheter placed by urology on 09/27/16  2-D echo 11/29/15: Study Conclusions  - Left ventricle: Distal septal hypokinesis The cavity size was   normal. Wall thickness was increased in a pattern of mild LVH.   Systolic function was normal. The estimated ejection fraction was   in the range of 50% to 55%. Wall motion was normal; there were no   regional wall motion abnormalities. - Mitral valve: There was mild regurgitation. - Atrial septum: No defect or patent  foramen ovale was identified.  Impressions:  - No cardiac source of emboli was indentified.  Antimicrobials:   None    Subjective: When seen this morning, patient was alert, had an mittens on, was fidgety in bed, talking incomprehensively, not following instructions. Subsequently was called to endoscopy by cardiologist because of acute change in status as per discussion above. Improved.  Objective:  Vitals:   09/29/16 0925 09/29/16 0930 09/29/16 0935 09/29/16 1100  BP: 131/60 (!) 119/54  117/61  Pulse: (!) 103 96 99 97  Resp: (!) 25 (!) 24 (!) 26 (!) 24  Temp:    99.1 F (37.3 C)  TempSrc:    Axillary  SpO2: 100% 98% 95% 95%  Weight:      Height:        Intake/Output Summary (Last 24 hours) at 09/29/16 1202 Last data filed at 09/29/16 1100  Gross per 24 hour  Intake              539 ml  Output             1975 ml  Net            -1436 ml   Filed Weights   09/26/16 1819 09/27/16 0054  Weight: 72.6 kg (160 lb) 72.3 kg (159 lb 4.8 oz)    Examination:  General exam: Pleasant elderly male lying comfortably propped up in bed.  Respiratory system: Slightly diminished breath sounds in the bases but otherwise clear to auscultation. Respiratory effort normal. Cardiovascular system: S1 & S2 heard, RRR. No JVD, murmurs, rubs, gallops or clicks. No pedal edema. Telemetry: Sinus tachycardia in the 100s-120s. Gastrointestinal system: Abdomen is nondistended, soft and  nontender. No organomegaly or masses felt. Normal bowel sounds heard. Central nervous system: Seen post apneic event in endoscopy on 12/15, somnolent but easily arousable, does not follow instructions. Facial asymmetry and aphasia present. Extremities: Moves all limbs symmetrically. Skin: Bruising of dorsum of right hand which appears old. Psychiatry: Judgement and insight -impaired.     Data Reviewed: I have personally reviewed following labs and imaging studies  CBC:  Recent Labs Lab 09/26/16 1834  09/28/16 0414 09/29/16 0606  WBC  --  10.1 15.8*  NEUTROABS  --  7.8*  --   HGB 10.2* 9.0* 8.7*  HCT 30.0* 29.1* 27.9*  MCV  --  76.2* 76.6*  PLT  --  216 181   Basic Metabolic Panel:  Recent Labs Lab 09/26/16 1800 09/26/16 1834 09/28/16 0414 09/29/16 0606  NA 138 138 135 137  K 3.7 3.8 4.0 3.9  CL 105 105 104 105  CO2 21*  --  21* 23  GLUCOSE 89 85 185* 175*  BUN 18 20 19 18   CREATININE 1.62* 1.60* 1.41* 1.29*  CALCIUM 9.4  --  8.9 9.3   GFR: Estimated Creatinine Clearance: 50.6 mL/min (by C-G formula based on SCr of 1.29 mg/dL (H)). Liver Function Tests:  Recent Labs Lab 09/26/16 1800  AST 31  ALT 15*  ALKPHOS 135*  BILITOT 1.1  PROT 6.7  ALBUMIN 3.3*   No results for input(s): LIPASE, AMYLASE in the last 168 hours. No results for input(s): AMMONIA in the last 168 hours. Coagulation Profile: No results for input(s): INR, PROTIME in the last 168 hours. Cardiac Enzymes:  Recent Labs Lab 09/27/16 0317 09/27/16 0810 09/27/16 1607  TROPONINI 0.62* 0.64* 0.54*   BNP (last 3 results) No results for input(s): PROBNP in the last 8760 hours. HbA1C:  Recent Labs  09/27/16 0317  HGBA1C 7.2*   CBG:  Recent Labs Lab 09/28/16 1705 09/28/16 2026 09/29/16 0038 09/29/16 0400 09/29/16 0911  GLUCAP 288* 269* 196* 170* 182*   Lipid Profile:  Recent Labs  09/27/16 0317  CHOL 173  HDL 33*  LDLCALC 120*  TRIG 102  CHOLHDL 5.2   Thyroid Function Tests: No results for input(s): TSH, T4TOTAL, FREET4, T3FREE, THYROIDAB in the last 72 hours. Anemia Panel: No results for input(s): VITAMINB12, FOLATE, FERRITIN, TIBC, IRON, RETICCTPCT in the last 72 hours.  Sepsis Labs:  Recent Labs Lab 09/26/16 1834  LATICACIDVEN 1.28    Recent Results (from the past 240 hour(s))  Culture, blood (routine x 2)     Status: None (Preliminary result)   Collection Time: 09/27/16  3:20 AM  Result Value Ref Range Status   Specimen Description BLOOD RIGHT ANTECUBITAL   Final   Special Requests BOTTLES DRAWN AEROBIC AND ANAEROBIC 10CC  Final   Culture NO GROWTH 1 DAY  Final   Report Status PENDING  Incomplete  Culture, blood (routine x 2)     Status: None (Preliminary result)   Collection Time: 09/27/16  3:28 AM  Result Value Ref Range Status   Specimen Description BLOOD RIGHT FOREARM  Final   Special Requests BOTTLES DRAWN AEROBIC ONLY 5CC  Final   Culture NO GROWTH 1 DAY  Final   Report Status PENDING  Incomplete  MRSA PCR Screening     Status: None   Collection Time: 09/27/16  3:40 AM  Result Value Ref Range Status   MRSA by PCR NEGATIVE NEGATIVE Final    Comment:        The GeneXpert MRSA Assay (FDA  approved for NASAL specimens only), is one component of a comprehensive MRSA colonization surveillance program. It is not intended to diagnose MRSA infection nor to guide or monitor treatment for MRSA infections.          Radiology Studies: Ct Head Wo Contrast  Result Date: 09/29/2016 CLINICAL DATA:  Reason CVA.  Intermittent apnea EXAM: CT HEAD WITHOUT CONTRAST TECHNIQUE: Contiguous axial images were obtained from the base of the skull through the vertex without intravenous contrast. COMPARISON:  Brain MRI September 26, 2016 FINDINGS: Brain: There is mild diffuse atrophy. There has been evolution of infarct in the left middle cerebral artery distribution with cytotoxic edema in the posterior left fall and much of the left superior temporal lobe regions. There is no appreciable hemorrhage or midline shift. No mass seen. There is no subdural or epidural fluid collections. There is also an infarct in the posteromedial right cerebellum which appears larger than on recent MR. There is no appreciable mass effect from this fairly small but present infarct. Several tiny lacunar type infarcts in the posterior circulation are not evident by CT. Elsewhere, there is patchy small vessel disease in the centra semiovale bilaterally. No new infarct is evident  compared to 3 days prior. Vascular: Increased attenuation is noted in the periphery of the left middle cerebral artery consistent with infarct in the left middle cerebral artery distribution, likely a small focus of thrombus in the distal left middle cerebral artery. No hyperdense vessel. There are foci of calcification in each carotid siphon. Skull: The bony calvarium appears intact. Sinuses/Orbits: Paranasal sinuses are clear. Orbits appear symmetric bilaterally. Other: There is opacification in several inferior mastoid air cells on the left. Mastoids elsewhere clear. 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 IMPRESSION: Evolution of left middle cerebral artery distribution infarct in the left posterior frontal and superior left temporal regions with increase in cytotoxic edema in this area. There is localized mass effect from the cytotoxic edema but no midline shift. There is a small infarct in the right cerebellum medially and posteriorly showing evolution from recent study. No hemorrhage. Note that there is hyperdensity in the distal left middle cerebral artery, likely thrombus in this region. There is mild atrophy with mild periventricular small vessel disease. There is inferior left mastoid air cell disease. There is advanced arthropathy in both temporomandibular joints. Electronically Signed   By: Bretta Bang III M.D.   On: 09/29/2016 10:02   US Renal  Result Date: 09/27/2016 CLINICAL DATA:  Urinary retention, hypertension, diabetes mellitus, chronic kidney disease EXAM: RENAL / URINARY TRACT ULTRASOUND COMPLETE COMPARISON:  Ultrasound abdomen 10/08/2015 FINDINGS: Right Kidney: Length: 5.1 cm. Cortical thinning. Upper normal cortical echogenicity. Small probable cyst at upper pole RIGHT kidney 1.6 x 1.8 x 2.0 cm, measured 2.7 x 1.8 x 2.0 cm on 10/08/2015. No additional mass, hydronephrosis or shadowing calcification. Left  Kidney: Length: By 0.7 cm. Diffuse cortical thinning. Normal cortical echogenicity. No mass, hydronephrosis or shadowing calcification. Bladder: Decompressed by Foley catheter, unable to evaluate. IMPRESSION: Interval decrease in size of a small cyst at the upper pole RIGHT kidney. Age-related cortical atrophy without evidence of additional renal mass or hydronephrosis. Electronically Signed   By: Ulyses Southward M.D.   On: 09/27/2016 16:55        Scheduled Meds: . aspirin  300 mg Rectal Daily   Or  . aspirin  325 mg Oral Daily  . atorvastatin  80 mg  Oral q1800  . divalproex  500 mg Oral Daily  . levothyroxine  75 mcg Oral QAC breakfast  . metoprolol tartrate  12.5 mg Oral Q6H   Continuous Infusions:    LOS: 3 days       [facility], MD [facility] Pager [phone]  If 7PM-7AM, please contact night-coverage [email or web address] Password [email or web address] 09/29/2016, 12:02 PM

## 2016-09-29 NOTE — Significant Event (Signed)
Rapid Response Event Note  Overview:  RRT page to Endo room 3 Time Called: 0914 Arrival Time: 0917 Event Type: Neurologic  Initial Focused Assessment:  On my arrival to patients bedside in endo suite, Md and Endo Rn's at bedside.  As per Rn patient is not as responsive as earlier, is now diaphoretic.  CBG checked 182, Dr. Mayford Knifeurner stated he dropped his sats and was apneic so was placed on NRM.  102, 140/44, 25, 99% on NRB.     Interventions:  As per Dr. Mayford Knifeurner Code Stroke called.  Stroke team at bedside, Dr. Pearlean BrownieSethi and Dr. Waymon AmatoHongalgi at bedside.  Code stroke cancelled, Stat Ct scan of Head done.  Patient transported back to 3 s12.    Plan of Care (if not transferred):  Patient to be monitored, new orders recieved Event Summary:  RN to call if assistance needed   at      at          [facility], Maryagnes Amosenise Ann

## 2016-09-29 NOTE — Code Documentation (Signed)
Code Stroke called on pt while in ENDO room preparing for TEE. According to nurse who did preop eval  Patient was alert but aphasic and did respond appropriately to questions with head movments and was alert.  On arrival in procedure room, the patient became somnolent and responsive only to deep sternal rub.  BS was 180.  Patient dropped sats to 30's and had periods of apnea.  Anesthesia was called and patient placed on 12L via FFM.  Sats back up to 94%.  Code stroke called. TEE cancelled for now. Stroke team and RR RN at bedside. Pt taken for STAT head CT. CT showed Evolution of left middle cerebral artery distribution infarct in the left posterior frontal and superior left temporal regions with increase in cytotoxic edema in this area. There is localized mass effect from the cytotoxic edema but no midline shift. There is a small infarct in the right cerebellum medially and posteriorly showing evolution from recent study. No hemorrhage. Pt taken back to 3S12. Bedside handoff with 3S RN Molli HazardMatthew.

## 2016-09-29 NOTE — Progress Notes (Signed)
STROKE TEAM PROGRESS NOTE   HISTORY OF PRESENT ILLNESS (per record) Douglas [name] is an 75 y.o. male who presents with aphasia and severe right sided weakness. His roommate found the patient on the floor at home. Last known well was today 09/27/2016 at 12:30 PM. Aphasia noted by EMS. CT head showed hypodensity in the left insular region and medial left temporal lobe, suspicious for subacute ischemic infarction. Increased density within an M2/proximal M3 branch of the left MCA was also seen on CT, suggestive of possible thrombosis. MRI and MRA of brain have been ordered. The patient is out of the IV tPA and 6 hour endovascular time windows.   His PMHx includes CKD, DM, [name] and HTN.  MRI brain reveals multiple subcentimeter foci of restricted diffusion at the cortical juxtacortical interfaces in multiple vascular territories, suspicious for shower emboli/cardioembolic strokes. Also seen is a moderate-size acute ischemic infarction involving the left insula and medial left temporal lobe. The above findings are consistent with completed strokes and there is no indication for CT perfusion study. He was admitted to the ICU stepdown unit for further evaluation and treatment.   SUBJECTIVE (INTERVAL HISTORY) Called to TEE lab for code stroke on this patient for sudden onset unresponsivess following brief episode of low O 2 sats.He had hematuria and urinary retention last night requiring urology consult and deflating and puncturing  foley balloon and inserting a new one. He was canceled. Patient was taken for a stat CT scan of the head which showed evolving left MCA infarct with cytotoxic edema and only very mild left to right shift. Compared with previous MRI from 09/26/16 these were expected evolutionary changes. No new infarct was noted. OBJECTIVE Temp:  [97 F (36.1 C)-100.1 F (37.8 C)] 99.1 F (37.3 C) (12/15 1100) Pulse Rate:  [96-118] 97 (12/15 1100) Cardiac Rhythm: Sinus tachycardia (12/15  0700) Resp:  [21-27] 24 (12/15 1100) BP: (117-177)/(42-122) 117/61 (12/15 1100) SpO2:  [93 %-100 %] 95 % (12/15 1100)  CBC:   Recent Labs Lab 09/28/16 0414 09/29/16 0606 09/29/16 1152  WBC 10.1 15.8*  --   NEUTROABS 7.8*  --  12.3*  HGB 9.0* 8.7*  --   HCT 29.1* 27.9*  --   MCV 76.2* 76.6*  --   PLT 216 181  --     Basic Metabolic Panel:   Recent Labs Lab 09/28/16 0414 09/29/16 0606  NA 135 137  K 4.0 3.9  CL 104 105  CO2 21* 23  GLUCOSE 185* 175*  BUN 19 18  CREATININE 1.41* 1.29*  CALCIUM 8.9 9.3    Lipid Panel:     Component Value Date/Time   CHOL 173 09/27/2016 0317   TRIG 102 09/27/2016 0317   HDL 33 (L) 09/27/2016 0317   CHOLHDL 5.2 09/27/2016 0317   VLDL 20 09/27/2016 0317   LDLCALC 120 (H) 09/27/2016 0317   HgbA1c:  Lab Results  Component Value Date   HGBA1C 7.2 (H) 09/27/2016   Urine Drug Screen: No results found for: LABOPIA, COCAINSCRNUR, LABBENZ, AMPHETMU, THCU, LABBARB    IMAGING  Dg Chest 2 View 09/26/2016  Ill-defined opacity seen in right lung concerning for possible pneumonia or edema.   Ct Head Wo Contrast 09/26/2016 1. Suspect edema within the left insular cortex, anterior temporal lobe and left inferior frontal lobe. MRI is recommended for further evaluation. Possible mildly dense distal left MCA artery. 2. No hemorrhage. 3. Atrophy   Mr Laqueta Jean Wo Contrast Mr Angiogram Head Wo Contrast [name] 09/27/2016 1. 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 Multiple other punctate foci of acute ischemia scattered throughout both cerebral and cerebellar hemispheres suggest a central embolic process. No hemorrhage or mass effect. 2. 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 3. Multiple foci of contrast enhancement within the cerebellum and right frontal lobe, with the largest focus measuring up to 2 cm. Given the other findings, these findings may be secondary to  subacute ischemia. However, follow-up imaging (MRI brain with and without contrast) is recommended in 4-6 weeks to exclude the possibility of a neoplastic process, primarily metastatic disease. 4. 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 Otherwise normal MRA of the neck.   [place] Renal 09/27/2016  Interval decrease in size of a small cyst at the upper pole RIGHT kidney. Age-related cortical atrophy without evidence of additional renal mass or hydronephrosis.   2-D echocardiogram  - Left ventricle: Distal septal hypokinesis The cavity size was normal. Wall thickness was increased in a pattern of mild LVH. Systolic function was normal. The estimated ejection fraction was in the range of 50% to 55%. Wall motion was normal; there were no regional wall motion abnormalities. - Mitral valve: There was mild regurgitation. - Atrial septum: No defect or patent foramen ovale was identified. Impressions:   No cardiac source of emboli was indentified.   PHYSICAL EXAM Frail elderly caucasian male not in distress. . Afebrile. Head is nontraumatic. Neck is supple without bruit.    Cardiac exam no murmur or gallop. Lungs are clear to auscultation. Distal pulses are well felt. Neurological Exam :  stuporose but can be aroused. Globally aphasic can follow only few simple midline and one-step commands. Unable to answer his name. No gaze deviation. But will not follow gaze in either direction. Does not blink to threat from either side. Right lower facial weakness. Tongue midline. Motor system exam shows mild right hemiparesis 4/5 strength  . Moves left side well against gravity and purposefully. Coordination cannot be reliably tested. Gait not tested.  ASSESSMENT/PLAN [name] is a 75 y.o. male with history of hypertension, hyperlipidemia, diabetes and chronic kidney disease presenting with marked aphasia and right-sided weakness. He did not receive IV t-PA due to delay in  arrival.   Stroke:  Left  MCA, as well as scattered bilateral cerebral and cerebellar  Acute and subacute infarcts, embolic secondary to unknown source   MRI  left MCA insular frontal infarct with scattered bilateral cerebral and cerebellar infarcts. Enhancing subacute left cerebellar and right frontal infarcts as well.  MRA head   occlusion of left MCA bifurcation  MRA neck left ICA 30% stenosis, otherwise normal  2D Echo  EF 50-55%, no source of embolus.   TEE to look for embolic source. Arranged with [facility] for tomorrow.  If positive for PFO (patent foramen ovale), check bilateral lower extremity venous dopplers to rule out DVT as possible source of stroke, also looking for possible endocarditis. (I have made patient NPO after midnight tonight).  Consider loop placement prior to discharge - likely next week. TEE tomorrow. Cardiology notified.   LDL 120  HgbA1c 7.2  On full dose Lovenox for suspected MI. Contraindicated in acute stroke. Changed to Lovenox 40 mg daily for VTE prophylaxis.    aspirin 81 mg daily prior to admission, now on aspirin 300 mg suppository daily  Ongoing aggressive stroke risk factor  management  Therapy recommendations:  CIR  Disposition:  pending (lived with non-famliy roommate, not married, no kids. Niece Nok)  Hypertension  Stable  Permissive hypertension (OK if < 220/120) but gradually normalize in 5-7 days  Long-term BP goal normotensive  Hyperlipidemia  Home meds:  lipitor 80 mg daily  Resume statin when able to swallow or tube placed  LDL 120, goal < 70  Continue statin at discharge  Diabetes type II  HgbA1c 7.2 , goal < 7.0  Uncontrolled  Other Stroke Risk Factors  Advanced age  Former Cigarette smoker  ETOH use  Other Active Problems  Elevated troponins in setting of stroke, not thought to be ACS. Cardiology consulted and would not anticoagulate. OP stress test. Discussed loop  recorder.  Agitated and resultant sleepiness. Unclear etiology. Check EEG. Add depacon 750 mg IV x 1 followed by depakote ER 500 mg daily.  Urinary retention with incomplete foreskin retraction, urology placed catheter  Hospital day # 3  I have personally examined this patient, reviewed notes, independently viewed imaging studies, participated in medical decision making and plan of care.ROS completed by me personally and pertinent positives fully documented  I have made any additions or clarifications directly to the above note. Agree with note above. Patient has had fluctuating neurological exam with depressed mentation throughout this admission. EEG yesterday did not show definite epileptiform activity. Stat CT scan of the head was obtained today which showed no acute abnormality but involving left MCA infarct with mild cytotoxic edema. I spoke to the patient's niece at the bedside as well as his friends. His prognosis appears guarded. Patient's is DO NOT RESUSCITATE as per niece. We will follow his examined carefully over the next few days if there is neurological worsening she may lean towards comfort care. Recommend swallow eval later today for patient is more alert otherwise consider panda tube for nutrition and medications. t.. Discussed with Dr. Waymon AmatoHongalgi and answered questions.   This patient is critically ill and at significant risk of neurological worsening, death and care requires constant monitoring of vital signs, hemodynamics,respiratory and cardiac monitoring, extensive review of multiple databases, frequent neurological assessment, discussion with family, other specialists and medical decision making of high complexity.I have made any additions or clarifications directly to the above note.This critical care time does not reflect procedure time, or teaching time or supervisory time of PA/NP/Med Resident etc but could involve care discussion time.  I spent 40 minutes of neurocritical care  time  in the care of  this patient.     Delia HeadyPramod Sethi, MD Medical Director [facility] Pager: [phone] 09/29/2016 1:09 PM   To contact Stroke Continuity provider, please refer to [email or web address]. After hours, contact General Neurology

## 2016-09-29 NOTE — Progress Notes (Addendum)
Addendum  Discussed with patient's niece in detail. Advised her regarding his decline today. Informed her that we will be monitoring closely and reassessing tomorrow. If he continues to make improvement and then continue medical treatment but family are to convene and discuss amongst themselves regarding goals of care, aggressiveness because patient had expressed no nursing home or feeding tubes. She does confirm DO NOT RESUSCITATE.. Dr. Pearlean BrownieSethi also discussed with her.  Chest x-ray suspicious for pneumonia. Started Unasyn per pharmacy.  Douglas [name],Douglas Bohannon, MD, FACP, FHM. [facility] Pager [phone]  If 7PM-7AM, please contact night-coverage [email or web address] Password [email or web address] 09/29/2016, 12:26 PM

## 2016-09-29 NOTE — Progress Notes (Signed)
I noticed patient pulling at foley cath and I assessed the line. The foley line appears to be out further then should be and feels coiled or balloon in shaft of penis feels abnormal. Urology notified and MD came to bedside and removed old foley and placed new foley. Blood in drainage MD aware. Please see MD note. I will continue to monitor. Patient tolerated well and more comfortable.

## 2016-09-29 NOTE — Interval H&P Note (Signed)
History and Physical Interval Note:  09/29/2016 7:55 AM  Douglas [name]  has presented today for surgery, with the diagnosis of stroke  The various methods of treatment have been discussed with the patient and family. After consideration of risks, benefits and other options for treatment, the patient has consented to  Procedure(s): TRANSESOPHAGEAL ECHOCARDIOGRAM (TEE) (N/A) as a surgical intervention .  The patient's history has been reviewed, patient examined, no change in status, stable for surgery.  I have reviewed the patient's chart and labs.  Questions were answered to the patient's satisfaction.     Armanda Magicraci Aviela Blundell

## 2016-09-29 NOTE — Progress Notes (Signed)
Patient arrived in Endo suite for TEE.  According to nurse who did preop eval  Patient was alert but aphasic and did respond appropriately to questions with head movments and was alert.  On arrival in procedure room, the patient became somnolent and responsive only to deep sternal rub.  BS was 180.  Patient dropped sats to 30's but there was a question as to whether it was tracking as patient had become very diaphoretic.  Anesthesia was called and patient placed on 12L via FFM.  Sats back up to 94%.  Code stroke called and team in the room.  TEE cancelled for now.

## 2016-09-29 NOTE — Progress Notes (Signed)
Speech Language Pathology Treatment: Dysphagia  Patient Details Name: Douglas [name] MRN: [id number] DOB: 10/04/1941 Today's Date: 09/29/2016 Time: [id number] SLP Time Calculation (min) (ACUTE ONLY): 14 min  Assessment / Plan / Recommendation Clinical Impression  Pt seen per MD request given change in medical status. He was asleep upon SLP arrival but did wake up with minimal stimulation. He has increased bolus confusion today, unable to obtain liquids by straw or cup despite attempting. SLP provided diagnostic trials of ice chips with suspected delayed swallow initiation with 15+ seconds passing after oral presentation. Pt also has immediate coughing before pharyngeal swallow is elicited, concerning for aspiration of premature spillage. Recommend to remain NPO for now. SLP to f/u to assess for ability to safely resume POs. RN and MD made aware, and niece was educated at bedside.   HPI HPI: 75 y.o. male with medical history significant of HTN, [name], DM, [name] presenting with a stroke. Pt with noted R sided weakness and expressive aphasia. 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      SLP Plan  Goals updated     Recommendations  Diet recommendations: NPO Medication Administration: Via alternative means                Oral Care Recommendations: Oral care QID Follow up Recommendations: Inpatient Rehab Plan: Goals updated       GO                Maxcine Hamaiewonsky, Aminah Zabawa 09/29/2016, 3:31 PM  Maxcine HamLaura Paiewonsky, M.A. CCC-SLP [phone]

## 2016-09-29 NOTE — Progress Notes (Signed)
Inpatient Rehabilitation  Following along at a distance for medical readiness and family decision regarding plans for next level of care when appropriate. Plan for my co-worker Weldon PickingSusan Blankenship to follow up next week.  Charlane FerrettiMelissa Berta Denson, M.A., CCC/SLP Admission Coordinator  [facility]  Cell [phone]

## 2016-09-29 NOTE — Progress Notes (Addendum)
Inpatient Diabetes Program Recommendations  AACE/ADA: New Consensus Statement on Inpatient Glycemic Control (2015)  Target Ranges:  Prepandial:   less than 140 mg/dL      Peak postprandial:   less than 180 mg/dL (1-2 hours)      Critically ill patients:  140 - 180 mg/dL   Lab Results  Component Value Date   GLUCAP 182 (H) 09/29/2016   HGBA1C 7.2 (H) 09/27/2016   Results for Douglas [name], Douglas [name] (MRN [id number]) as of 09/29/2016 11:56  Ref. Range 09/28/2016 08:56 09/28/2016 13:51 09/28/2016 17:05 09/28/2016 20:26 09/29/2016 00:38 09/29/2016 04:00 09/29/2016 09:11  Glucose-Capillary Latest Ref Range: 65 - 99 mg/dL 409199 (H) 811174 (H) 914288 (H) 269 (H) 196 (H) 170 (H) 182 (H)  Results for Douglas [name], Douglas [name] (MRN [id number]) as of 09/29/2016 15:42  Ref. Range 09/29/2016 11:01  Glucose-Capillary Latest Ref Range: 65 - 99 mg/dL 086211 (H)   Review of Glycemic Control  Diabetes history:     DM, CKD (Creatinine=1.41) Outpatient Diabetes medications:     Basaglar 60 units daily, Tradjenta 5 mg QAM Current orders for Inpatient glycemic control:     None, NPO  Inpatient Diabetes Program Recommendations:     Please consider Novolog 0-9 units Q4H correction due to CBG's trending upward.  Thank you,  Kristine LineaKaren Askari Kinley, RN, BSN Diabetes Coordinator Inpatient Diabetes Program [phone] (Team Pager)

## 2016-09-29 NOTE — Progress Notes (Signed)
S: This is a patient of Dr. Imelda Pillowannenbaum's (follows for BPH and elevated PSA). Dr. Laverle PatterBorden placed a Foley a few days ago due to urinary retention. Pt had phimosis which made GU consult for foley placement necessary. Nurse called me and the catheter has not drained since 4:30 PM, most of the catheter length was external to the patient and she could palpate something firm in the patient's penile urethra. I instructed her to deflate the balloon and re-advance the catheter. She called back and the balloon would not deflate.  O: Vitals:   09/28/16 2029 09/29/16 0035  BP: (!) 176/78 (!) 177/122  Pulse: (!) 114 (!) 117  Resp: (!) 23 (!) 26  Temp: 99 F (37.2 C) 98.5 F (36.9 C)   Patient is in no acute distress On exam he is uncircumcised with phimosis. There is a tense balloon palpable in the penile urethra and it is spread out along the length of the penile urethra. More proximally at the base of the penis I can palpate the tip of the catheter.   Procedure: The skin ventrally over the penile urethra in the tense balloon was prepped with Betadine. A 22-gauge needle was advanced through the skin and the balloon immediately popped and deflated. The catheter was removed. There was mild bleeding welling up from the foreskin as one might expect. The foreskin was prepped with Betadine and a new 16 French Foley catheter was advanced. After a few attempts I found the meatus again and advanced the catheter into the bladder without difficulty. The balloon was inflated to 15 mL and seated at the bladder neck. He drained about 1200 mL of dark purple urine.  I then irrigated the catheter and it quickly cleared. There were no clots. Catheter left to gravity drainage draining light pink almost clear urine.  BMET    Component Value Date/Time   NA 135 09/28/2016 0414   K 4.0 09/28/2016 0414   CL 104 09/28/2016 0414   CO2 21 (L) 09/28/2016 0414   GLUCOSE 185 (H) 09/28/2016 0414   BUN 19 09/28/2016 0414   CREATININE  1.41 (H) 09/28/2016 0414   CALCIUM 8.9 09/28/2016 0414   GFRNONAA 47 (L) 09/28/2016 0414   GFRAA 55 (L) 09/28/2016 0414   Assessment/plan:  Urinary retention-continue Foley catheter for at least 5 days  Gross hematuria-likely from balloon trauma and it should resolve. Foley as above.  Will sign off again, but please page GU with any questions or concerns.

## 2016-09-30 DIAGNOSIS — I6300 Cerebral infarction due to thrombosis of unspecified precerebral artery: Secondary | ICD-10-CM

## 2016-09-30 LAB — CBC
HEMATOCRIT: 28.3 % — AB (ref 39.0–52.0)
Hemoglobin: 8.5 g/dL — ABNORMAL LOW (ref 13.0–17.0)
MCH: 23.4 pg — ABNORMAL LOW (ref 26.0–34.0)
MCHC: 30 g/dL (ref 30.0–36.0)
MCV: 77.7 fL — AB (ref 78.0–100.0)
Platelets: 172 10*3/uL (ref 150–400)
RBC: 3.64 MIL/uL — ABNORMAL LOW (ref 4.22–5.81)
RDW: 20.9 % — AB (ref 11.5–15.5)
WBC: 11.5 10*3/uL — AB (ref 4.0–10.5)

## 2016-09-30 LAB — RAPID URINE DRUG SCREEN, HOSP PERFORMED
Amphetamines: NOT DETECTED
BARBITURATES: NOT DETECTED
BENZODIAZEPINES: NOT DETECTED
Cocaine: NOT DETECTED
Opiates: NOT DETECTED
Tetrahydrocannabinol: NOT DETECTED

## 2016-09-30 LAB — GLUCOSE, CAPILLARY
GLUCOSE-CAPILLARY: 222 mg/dL — AB (ref 65–99)
Glucose-Capillary: 132 mg/dL — ABNORMAL HIGH (ref 65–99)
Glucose-Capillary: 133 mg/dL — ABNORMAL HIGH (ref 65–99)
Glucose-Capillary: 151 mg/dL — ABNORMAL HIGH (ref 65–99)
Glucose-Capillary: 144 mg/dL — ABNORMAL HIGH (ref 65–99)

## 2016-09-30 LAB — BASIC METABOLIC PANEL
ANION GAP: 11 (ref 5–15)
BUN: 20 mg/dL (ref 6–20)
CALCIUM: 9.2 mg/dL (ref 8.9–10.3)
CO2: 23 mmol/L (ref 22–32)
Chloride: 105 mmol/L (ref 101–111)
Creatinine, Ser: 1.33 mg/dL — ABNORMAL HIGH (ref 0.61–1.24)
GFR calc Af Amer: 59 mL/min — ABNORMAL LOW (ref >60)
GFR calc non Af Amer: 51 mL/min — ABNORMAL LOW (ref >60)
GLUCOSE: 133 mg/dL — AB (ref 65–99)
Potassium: 4.1 mmol/L (ref 3.5–5.1)
Sodium: 139 mmol/L (ref 135–145)

## 2016-09-30 LAB — URINALYSIS, ROUTINE W REFLEX MICROSCOPIC
BILIRUBIN URINE: NEGATIVE
Bacteria, UA: NONE SEEN
KETONES UR: NEGATIVE mg/dL
Nitrite: NEGATIVE
PH: 6 (ref 5.0–8.0)
Protein, ur: 100 mg/dL — AB
SPECIFIC GRAVITY, URINE: 1.013 (ref 1.005–1.030)

## 2016-09-30 LAB — PROCALCITONIN: Procalcitonin: 0.26 ng/mL

## 2016-09-30 NOTE — Progress Notes (Signed)
Subjective:  Remains aphasic.  TEE aborted yesterday with hypoxemia and unresponsiveness.  Appears better today and nothing new was found on CT scan.  Is evidently a DO NOT RESUSCITATE.  Objective:  Vital Signs in the last 24 hours: BP (!) 148/60 (BP Location: Left Arm)   Pulse 86   Temp 98.8 F (37.1 C) (Axillary)   Resp 20   Ht 5\' 10"  (1.778 m)   Wt 72.3 kg (159 lb 4.8 oz)   SpO2 97%   BMI 22.86 kg/m   Physical Exam: Elderly male aphasic Lungs:  Clear Cardiac:  Regular rhythm, normal S1 and S2, no S3 Extremities:  No edema present  Intake/Output from previous day: 12/15 0701 - 12/16 0700 In: -  Out: 1075 [Urine:1075]  Weight Filed Weights   09/26/16 1819 09/27/16 0054  Weight: 72.6 kg (160 lb) 72.3 kg (159 lb 4.8 oz)    Lab Results: Basic Metabolic Panel:  Recent Labs  40/98/1112/15/17 0606 09/30/16 0338  NA 137 139  K 3.9 4.1  CL 105 105  CO2 23 23  GLUCOSE 175* 133*  BUN 18 20  CREATININE 1.29* 1.33*   CBC:  Recent Labs  09/28/16 0414 09/29/16 0606 09/29/16 1152 09/30/16 0338  WBC 10.1 15.8*  --  11.5*  NEUTROABS 7.8*  --  12.3*  --   HGB 9.0* 8.7*  --  8.5*  HCT 29.1* 27.9*  --  28.3*  MCV 76.2* 76.6*  --  77.7*  PLT 216 181  --  172   Cardiac Enzymes: Troponin (Point of Care Test) No results for input(s): TROPIPOC in the last 72 hours. Cardiac Panel (last 3 results)  Recent Labs  09/27/16 1607  TROPONINI 0.54*    Telemetry: Sinus rhythm  Assessment/Plan:  1.  Recent strokes thought to be embolic-TEE was aborted yesterday due to comorbidities 2.  Hypertension per Mrs. for now in light of recent stroke 3.  Elevated troponin may be multifactorial and due to stroke 4.  Diabetes mellitus  Recommendations: He is currently on aspirin only.  We'll await Dr. Marlis EdelsonSethi's thoughts about how much he really wants to have a TEE as was difficult to do yesterday and has fairly severe neurologic defects.  Allow permissive hypertension.  Otherwise no  additional cardiology thoughts.     Darden PalmerW. Spencer Niguel Moure, Jr.  MD [facility] Cardiology  09/30/2016, 9:43 AM

## 2016-09-30 NOTE — Progress Notes (Signed)
STROKE TEAM PROGRESS NOTE   HISTORY OF PRESENT ILLNESS (per record) Douglas [name] is an 75 y.o. male who presents with aphasia and severe right sided weakness. His roommate found the patient on the floor at home. Last known well was today 09/27/2016 at 12:30 PM. Aphasia noted by EMS. CT head showed hypodensity in the left insular region and medial left temporal lobe, suspicious for subacute ischemic infarction. Increased density within an M2/proximal M3 branch of the left MCA was also seen on CT, suggestive of possible thrombosis. MRI and MRA of brain have been ordered. The patient is out of the IV tPA and 6 hour endovascular time windows.   His PMHx includes CKD, DM, [name] and HTN.  MRI brain reveals multiple subcentimeter foci of restricted diffusion at the cortical juxtacortical interfaces in multiple vascular territories, suspicious for shower emboli/cardioembolic strokes. Also seen is a moderate-size acute ischemic infarction involving the left insula and medial left temporal lobe. The above findings are consistent with completed strokes and there is no indication for CT perfusion study. He was admitted to the ICU stepdown unit for further evaluation and treatment.   SUBJECTIVE (INTERVAL HISTORY) Patient slightly more alert today but remains aphasic. Niece is at bedside OBJECTIVE Temp:  [97.8 F (36.6 C)-100.1 F (37.8 C)] 99.2 F (37.3 C) (12/16 0400) Pulse Rate:  [88-108] 88 (12/16 0400) Cardiac Rhythm: Idioventricular (12/15 1942) Resp:  [21-26] 21 (12/16 0400) BP: (117-157)/(42-67) 134/67 (12/16 0400) SpO2:  [95 %-100 %] 98 % (12/16 0400)  CBC:   Recent Labs Lab 09/28/16 0414 09/29/16 0606 09/29/16 1152 09/30/16 0338  WBC 10.1 15.8*  --  11.5*  NEUTROABS 7.8*  --  12.3*  --   HGB 9.0* 8.7*  --  8.5*  HCT 29.1* 27.9*  --  28.3*  MCV 76.2* 76.6*  --  77.7*  PLT 216 181  --  172    Basic Metabolic Panel:   Recent Labs Lab 09/29/16 0606 09/30/16 0338  NA 137 139   K 3.9 4.1  CL 105 105  CO2 23 23  GLUCOSE 175* 133*  BUN 18 20  CREATININE 1.29* 1.33*  CALCIUM 9.3 9.2    Lipid Panel:     Component Value Date/Time   CHOL 173 09/27/2016 0317   TRIG 102 09/27/2016 0317   HDL 33 (L) 09/27/2016 0317   CHOLHDL 5.2 09/27/2016 0317   VLDL 20 09/27/2016 0317   LDLCALC 120 (H) 09/27/2016 0317   HgbA1c:  Lab Results  Component Value Date   HGBA1C 7.2 (H) 09/27/2016   Urine Drug Screen:     Component Value Date/Time   LABOPIA NONE DETECTED 09/30/2016 0248   COCAINSCRNUR NONE DETECTED 09/30/2016 0248   LABBENZ NONE DETECTED 09/30/2016 0248   AMPHETMU NONE DETECTED 09/30/2016 0248   THCU NONE DETECTED 09/30/2016 0248   LABBARB NONE DETECTED 09/30/2016 0248      IMAGING  Dg Chest 2 View 09/26/2016  Ill-defined opacity seen in right lung concerning for possible pneumonia or edema.   Ct Head Wo Contrast 09/26/2016 1. Suspect edema within the left insular cortex, anterior temporal lobe and left inferior frontal lobe. MRI is recommended for further evaluation. Possible mildly dense distal left MCA artery. 2. No hemorrhage. 3. Atrophy   Mr Douglas [name] Wo Contrast Mr Angiogram Head Wo Contrast Mr Angiogram Neck [name] Or Wo Contrast 09/27/2016 1. 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 Multiple other punctate foci of acute ischemia scattered throughout both  cerebral and cerebellar hemispheres suggest a central embolic process. No hemorrhage or mass effect. 2. 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 3. Multiple foci of contrast enhancement within the cerebellum and right frontal lobe, with the largest focus measuring up to 2 cm. Given the other findings, these findings may be secondary to subacute ischemia. However, follow-up imaging (MRI brain with and without contrast) is recommended in 4-6 weeks to exclude the possibility of a neoplastic process, primarily metastatic disease. 4. Approximately 30%  stenosis of the proximal left internal carotid artery. Otherwise normal MRA of the neck.   US Renal 09/27/2016  Interval decrease in size of a small cyst at the upper pole RIGHT kidney. Age-related cortical atrophy without evidence of additional renal mass or hydronephrosis.   2-D echocardiogram  - Left ventricle: Distal septal hypokinesis The cavity size was normal. Wall thickness was increased in a pattern of mild LVH. Systolic function was normal. The estimated ejection fraction was in the range of 50% to 55%. Wall motion was normal; there were no regional wall motion abnormalities. - Mitral valve: There was mild regurgitation. - Atrial septum: No defect or patent foramen ovale was identified. Impressions:   No cardiac source of emboli was indentified.   PHYSICAL EXAM Frail elderly caucasian male not in distress. . Afebrile. Head is nontraumatic. Neck is supple without bruit.    Cardiac exam no murmur or gallop. Lungs are clear to auscultation. Distal pulses are well felt. Neurological Exam :  sleepy but can be aroused easily. Globally aphasic can follow only few simple midline and one-step commands. Unable to answer his name. No gaze deviation. But will not follow gaze in either direction. Does not blink to threat from either side. Right lower facial weakness. Tongue midline. Motor system exam shows mild right hemiparesis 4/5 strength  . Moves left side well against gravity and purposefully. Coordination cannot be reliably tested. Gait not tested.  ASSESSMENT/PLAN Douglas [name] is a 75 y.o. male with history of hypertension, hyperlipidemia, diabetes and chronic kidney disease presenting with marked aphasia and right-sided weakness. He did not receive IV t-PA due to delay in arrival.   Stroke:  Left  MCA, as well as scattered bilateral cerebral and cerebellar  Acute and subacute infarcts, embolic secondary to unknown source   MRI  left MCA insular frontal infarct with scattered  bilateral cerebral and cerebellar infarcts. Enhancing subacute left cerebellar and right frontal infarcts as well.  MRA head   occlusion of left MCA bifurcation  MRA neck left ICA 30% stenosis, otherwise normal  2D Echo  EF 50-55%, no source of embolus.   TEE to look for embolic source. Arranged with [facility] for tomorrow.  If positive for PFO (patent foramen ovale), check bilateral lower extremity venous dopplers to rule out DVT as possible source of stroke, also looking for possible endocarditis. (I have made patient NPO after midnight tonight).  Consider loop placement prior to discharge - likely next week. TEE tomorrow. Cardiology notified.   LDL 120  HgbA1c 7.2  On full dose Lovenox for suspected MI. Contraindicated in acute stroke. Changed to Lovenox 40 mg daily for VTE prophylaxis.    aspirin 81 mg daily prior to admission, now on aspirin 300 mg suppository daily  Ongoing aggressive stroke risk factor management  Therapy recommendations:  CIR  Disposition:  pending (lived with non-famliy roommate, not married, no kids. Niece Nok)  Hypertension  Stable  Permissive hypertension (OK  if < 220/120) but gradually normalize in 5-7 days  Long-term BP goal normotensive  Hyperlipidemia  Home meds:  lipitor 80 mg daily  Resume statin when able to swallow or tube placed  LDL 120, goal < 70  Continue statin at discharge  Diabetes type II  HgbA1c 7.2 , goal < 7.0  Uncontrolled  Other Stroke Risk Factors  Advanced age  Former Cigarette smoker  ETOH use  Other Active Problems  Elevated troponins in setting of stroke, not thought to be ACS. Cardiology consulted and would not anticoagulate. OP stress test. Discussed loop recorder.  Agitated and resultant sleepiness. Unclear etiology. Check EEG. Add depacon 750 mg IV x 1 followed by depakote ER 500 mg daily.  Urinary retention with incomplete foreskin retraction, urology placed  catheter  Hospital day # 4  I have personally examined this patient, reviewed notes, independently viewed imaging studies, participated in medical decision making and plan of care.ROS completed by me personally and pertinent positives fully documented  I have made any additions or clarifications directly to the above note.  . His prognosis appears guarded. Patient's is DO NOT RESUSCITATE as per niece. We will follow his exam  carefully over the next few days if there is neurological worsening she may lean towards comfort care. Recommend swallow eval   today for patient is more alert otherwise consider panda tube for nutrition and medications. .. Discussed with niece  i and answered questions.  Greater than 50% time during this 35 minute visit was spent on counseling and coordination of care about his stroke risk, dysphagia evaluation and answered questions  Delia HeadyPramod Jahmiyah Dullea, MD Medical Director [facility] Pager: [phone] 09/30/2016 11:41 AM        To contact Stroke Continuity provider, please refer to [email or web address]. After hours, contact General Neurology

## 2016-09-30 NOTE — Progress Notes (Signed)
PROGRESS NOTE  Douglas [name]  UJW:[id number] DOB: 1941/08/10  DOA: 09/26/2016 PCP: Hollice Espy, MD   Brief Narrative:  75 year old male with PMH of HTN, [name], DM, CKD who presented to [facility] ED on 09/26/16 after his roommate found him in the floor of the living room and with speech difficulties. Last known well 09/26/16  and 12:30 PM. Workup confirmed bilateral embolic looking strokes and elevated troponin. Neurology and cardiology consulted.  Assessment & Plan:   Principal Problem:   Stroke (cerebrum) (HCC) Active Problems:   Hyperlipidemia   Essential hypertension   DM (diabetes mellitus) (HCC)   CKD (chronic kidney disease)   Elevated troponin   Anemia   Acute embolic stroke (HCC)   Benign essential HTN   Tachycardia   Urinary retention   Acute delirium   1. Acute bilateral strokes: Left MCA, as well as scattered bilateral cerebral and cerebellar acute and subacute infarcts. Etiology: embolic of unknown source. Resultant receptive and expressive aphasia, facial asymmetry and right upper extremity weakness. MRI brain 09/26/16: Left MCA infarct. Multiple foci of acute ischemia scattered throughout both cerebral and cerebellar hemispheres. No hemorrhage or mass effect. MRA head: Occlusion of left MCA at the level of the bifurcation. 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 2-D echo: EF 50-55 percent, no source of embolus.. Carotid Dopplers: Not requested (had MRA neck). LDL 120. A1c: 7.2. Was on aspirin 81 MG daily prior to admission, now on 325 MG daily for secondary stroke prophylaxis. Therapies evaluation : Recommend CIR. Stroke team follow-up appreciated. Discussed with Dr. Pearlean Brownie TEE arranged for 12/15 to rule out embolic source. Low index of suspicion for endocarditis unless he spikes a fever or positive blood cultures or TEE for vegetations and hence holding off on ID consultation at this time. Unable to do TEE 12/15 due to acute deterioration-see below. EEG  without reported seizures. Remains on Depakote. Repeat head CT shows left MCA infarct with increase in cytotoxic edema with localized mass effect but no midline shift and other findings as below.continue treating conservatively as we are over the weekend and if he does not improve then may have to consider palliative care consultation. TEE postponed to Monday. 2. Acute encephalopathy: Multifactorial including acute worsening strokes, rule out infection i.e. aspiration pneumonia and baseline mental status not known. Treat underlying cause and monitor. 3. Apnea/respiratory arrest 12/15: When patient was in endoscopy for TEE, he became somnolent, responsive only to painful stimuli, blood sugar 180, hypoxia and had periods of apnea. Subsequently came around and was more alert, hypoxia resolved. Repeated CT showed worsening as above, likely etiology but getting chest x-ray to rule out pneumonia. Monitor closely and if he declines, may need ICU transfer, intubation and CCM consultation. 4.  Acute urinary retention: Urology was consulted and underwent Foley catheter placement 12/13. Reassess in a few days with voiding trial when he is close to discharge. Patient partially pulled out Foley catheter on 12/15 and had to be replaced. Hematuria secondary to trauma. Monitor. 5. Type II DM/IDDM with Recurrent hypoglycemia: Hold Onglyza. Likely due to nothing by mouth. Briefly treated with IV D5 infusion. Hypoglycemia resolved. Discontinued IV fluids. Diet resumed. Monitor closely. If CBGs consistently high, start sensitive SSI. A1c 7.2. 6. Elevated troponin: No chest pain reported. Patient found down at home.? Related to acute stroke and renal insufficiency. 2-D echo results as below. Cardiology consultation appreciated and feels that elevated troponin is due to acute strokes. Outpatient stress test. 7. Essential hypertension: Allow permissive  hypertension given recent acute stroke. Holding lisinopril. 8. Hyperlipidemia:  LDL 120. Continue statins and Lopid. 9. Possible stage III chronic kidney disease: Baseline creatinine not known. Trend creatinine. Given acute urinary retention, check renal ultrasound to rule out obstruction. Renal ultrasound without hydronephrosis. Creatinine slightly improved to 1.29. Follow BMP in a.m. 10. Anemia: Baseline hemoglobin not known. Presented with hemoglobin of 10.2. May be chronic. Consider outpatient workup. Hemoglobin has dropped to 9>8.7. No overt bleeding except traumatic hematuria. Follow CBC and consider transfusion if hemoglobin <7 g per DL. 11. Right lung opacity, seen on chest x-ray 09/26/16: Currently no cough, fever or dyspnea reported. Monitor clinically without antibiotics. 12. Hypothyroid: Resume home dose of Synthroid. As per pharmacist's home medication reconciliation, it is not known as to which medications and when he was taking it prior to admission. 13. Aspiration pneumonia : Continue with IV Unasyn, follow on blood cultures, continue with Mucinex . SLP input appreciated, currently on dysphagia 1 with nectar thick liquid     DVT prophylaxis: Lovenox Code Status: DO NOT RESUSCITATE >this was discussed in detail by admitting M.D. with patient and family. Family Communication: None at bedside Disposition Plan: Pending further evaluation, medical stability and therapies input. Possible CIR.   Consultants:   Neurology  Cardiology  Urology  Procedures:   Foley catheter placed by urology on 09/27/16  2-D echo 11/29/15: Study Conclusions  - Left ventricle: Distal septal hypokinesis The cavity size was   normal. Wall thickness was increased in a pattern of mild LVH.   Systolic function was normal. The estimated ejection fraction was   in the range of 50% to 55%. Wall motion was normal; there were no   regional wall motion abnormalities. - Mitral valve: There was mild regurgitation. - Atrial septum: No defect or patent foramen ovale was  identified.  Impressions:  - No cardiac source of emboli was indentified.  Antimicrobials:   None    Subjective: When seen this morning, sleeping comfortably, hard to arouse, no significant events overnight .  Objective:  Vitals:   09/30/16 0000 09/30/16 0400 09/30/16 0800 09/30/16 1128  BP: (!) 141/58 134/67 (!) 148/60 (!) 126/57  Pulse: 89 88 86 93  Resp: (!) 22 (!) 21 20 (!) 21  Temp: 97.8 F (36.6 C) 99.2 F (37.3 C) 98.8 F (37.1 C) 99.3 F (37.4 C)  TempSrc: Oral Axillary Axillary Axillary  SpO2: 96% 98% 97% 96%  Weight:      Height:        Intake/Output Summary (Last 24 hours) at 09/30/16 1346 Last data filed at 09/30/16 0800  Gross per 24 hour  Intake                0 ml  Output              900 ml  Net             -900 ml   Filed Weights   09/26/16 1819 09/27/16 0054  Weight: 72.6 kg (160 lb) 72.3 kg (159 lb 4.8 oz)    Examination:  General exam: Pleasant elderly male lying comfortably in bed.  Respiratory system: Slightly diminished breath sounds in the bases but otherwise clear to auscultation. Respiratory effort normal. Cardiovascular system: S1 & S2 heard, RRR. No JVD, murmurs, rubs, gallops or clicks. No pedal edema. Telemetry: Sinus tachycardia in the 100s-120s. Gastrointestinal system: Abdomen is nondistended, soft and nontender. No organomegaly or masses felt. Normal bowel sounds heard. Central nervous system: Seen post  apneic event in endoscopy on 12/15, somnolent but easily arousable, does not follow instructions. Facial asymmetry and aphasia present. Extremities: Moves all limbs symmetrically. Skin: Bruising of dorsum of right hand which appears old. Psychiatry: Judgement and insight -impaired.     Data Reviewed: I have personally reviewed following labs and imaging studies  CBC:  Recent Labs Lab 09/26/16 1834 09/28/16 0414 09/29/16 0606 09/29/16 1152 09/30/16 0338  WBC  --  10.1 15.8*  --  11.5*  NEUTROABS  --  7.8*  --   12.3*  --   HGB 10.2* 9.0* 8.7*  --  8.5*  HCT 30.0* 29.1* 27.9*  --  28.3*  MCV  --  76.2* 76.6*  --  77.7*  PLT  --  216 181  --  172   Basic Metabolic Panel:  Recent Labs Lab 09/26/16 1800 09/26/16 1834 09/28/16 0414 09/29/16 0606 09/30/16 0338  NA 138 138 135 137 139  K 3.7 3.8 4.0 3.9 4.1  CL 105 105 104 105 105  CO2 21*  --  21* 23 23  GLUCOSE 89 85 185* 175* 133*  BUN 18 20 19 18 20   CREATININE 1.62* 1.60* 1.41* 1.29* 1.33*  CALCIUM 9.4  --  8.9 9.3 9.2   GFR: Estimated Creatinine Clearance: 49.1 mL/min (by C-G formula based on SCr of 1.33 mg/dL (H)). Liver Function Tests:  Recent Labs Lab 09/26/16 1800  AST 31  ALT 15*  ALKPHOS 135*  BILITOT 1.1  PROT 6.7  ALBUMIN 3.3*   No results for input(s): LIPASE, AMYLASE in the last 168 hours. No results for input(s): AMMONIA in the last 168 hours. Coagulation Profile: No results for input(s): INR, PROTIME in the last 168 hours. Cardiac Enzymes:  Recent Labs Lab 09/27/16 0317 09/27/16 0810 09/27/16 1607  TROPONINI 0.62* 0.64* 0.54*   BNP (last 3 results) No results for input(s): PROBNP in the last 8760 hours. HbA1C: No results for input(s): HGBA1C in the last 72 hours. CBG:  Recent Labs Lab 09/29/16 1953 09/29/16 2353 09/30/16 0420 09/30/16 0825 09/30/16 1151  GLUCAP 149* 134* 132* 133* 151*   Lipid Profile: No results for input(s): CHOL, HDL, LDLCALC, TRIG, CHOLHDL, LDLDIRECT in the last 72 hours. Thyroid Function Tests: No results for input(s): TSH, T4TOTAL, FREET4, T3FREE, THYROIDAB in the last 72 hours. Anemia Panel: No results for input(s): VITAMINB12, FOLATE, FERRITIN, TIBC, IRON, RETICCTPCT in the last 72 hours.  Sepsis Labs:  Recent Labs Lab 09/26/16 1834 09/29/16 1152 09/30/16 0338  PROCALCITON  --  0.27 0.26  LATICACIDVEN 1.28  --   --     Recent Results (from the past 240 hour(s))  Culture, blood (routine x 2)     Status: None (Preliminary result)   Collection Time:  09/27/16  3:20 AM  Result Value Ref Range Status   Specimen Description BLOOD RIGHT ANTECUBITAL  Final   Special Requests BOTTLES DRAWN AEROBIC AND ANAEROBIC 10CC  Final   Culture NO GROWTH 2 DAYS  Final   Report Status PENDING  Incomplete  Culture, blood (routine x 2)     Status: None (Preliminary result)   Collection Time: 09/27/16  3:28 AM  Result Value Ref Range Status   Specimen Description BLOOD RIGHT FOREARM  Final   Special Requests BOTTLES DRAWN AEROBIC ONLY 5CC  Final   Culture NO GROWTH 2 DAYS  Final   Report Status PENDING  Incomplete  MRSA PCR Screening     Status: None   Collection Time: 09/27/16  3:40  AM  Result Value Ref Range Status   MRSA by PCR NEGATIVE NEGATIVE Final    Comment:        The GeneXpert MRSA Assay (FDA approved for NASAL specimens only), is one component of a comprehensive MRSA colonization surveillance program. It is not intended to diagnose MRSA infection nor to guide or monitor treatment for MRSA infections.          Radiology Studies: Ct Head Wo Contrast  Result Date: 09/29/2016 CLINICAL DATA:  Reason CVA.  Intermittent apnea EXAM: CT HEAD WITHOUT CONTRAST TECHNIQUE: Contiguous axial images were obtained from the base of the skull through the vertex without intravenous contrast. COMPARISON:  Brain MRI September 26, 2016 FINDINGS: Brain: There is mild diffuse atrophy. There has been evolution of infarct in the left middle cerebral artery distribution with cytotoxic edema in the posterior left fall and much of the left superior temporal lobe regions. There is no appreciable hemorrhage or midline shift. No mass seen. There is no subdural or epidural fluid collections. There is also an infarct in the posteromedial right cerebellum which appears larger than on recent MR. There is no appreciable mass effect from this fairly small but present infarct. Several tiny lacunar type infarcts in the posterior circulation are not evident by CT. Elsewhere,  there is patchy small vessel disease in the centra semiovale bilaterally. No new infarct is evident compared to 3 days prior. Vascular: Increased attenuation is noted in the periphery of the left middle cerebral artery consistent with infarct in the left middle cerebral artery distribution, likely a small focus of thrombus in the distal left middle cerebral artery. No hyperdense vessel. There are foci of calcification in each carotid siphon. Skull: The bony calvarium appears intact. Sinuses/Orbits: Paranasal sinuses are clear. Orbits appear symmetric bilaterally. Other: There is opacification in several inferior mastoid air cells on the left. Mastoids elsewhere clear. 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 IMPRESSION: Evolution of left middle cerebral artery distribution infarct in the left posterior frontal and superior left temporal regions with increase in cytotoxic edema in this area. There is localized mass effect from the cytotoxic edema but no midline shift. There is a small infarct in the right cerebellum medially and posteriorly showing evolution from recent study. No hemorrhage. Note that there is hyperdensity in the distal left middle cerebral artery, likely thrombus in this region. There is mild atrophy with mild periventricular small vessel disease. There is inferior left mastoid air cell disease. There is advanced arthropathy in both temporomandibular joints. Electronically Signed   By: Bretta BangWilliam  Woodruff III M.D.   On: 09/29/2016 10:02   Dg Chest Port 1 View  Result Date: 09/29/2016 CLINICAL DATA:  Transesophageal echocardiogram, decreased responsiveness, apnea, desaturation, code stroke. EXAM: PORTABLE CHEST 1 VIEW COMPARISON:  09/26/2016. FINDINGS: Trachea is midline. Heart size stable. Mild patchy airspace opacification in the right perihilar region. Lungs are low in volume. Minimal bibasilar atelectasis. No pleural  fluid. Left apical pleural thickening. IMPRESSION: Minimal right perihilar airspace opacification may be due to pneumonia. Her Followup PA and lateral chest X-ray is recommended in 3-4 weeks following trial of antibiotic therapy to ensure resolution and exclude underlying malignancy. Electronically Signed   By: Leanna BattlesMelinda  Blietz M.D.   On: 09/29/2016 12:26        Scheduled Meds: . ampicillin-sulbactam (UNASYN) IV  3 g Intravenous Q8H  . aspirin  300 mg Rectal Daily   Or  .  aspirin  325 mg Oral Daily  . atorvastatin  80 mg Oral q1800  . divalproex  500 mg Oral Daily  . levothyroxine  75 mcg Oral QAC breakfast  . metoprolol  2.5 mg Intravenous Q6H   Continuous Infusions:    LOS: 4 days       Shaquill Iseman, MD [facility] Pager [phone]  If 7PM-7AM, please contact night-coverage [email or web address] Password [email or web address] 09/30/2016, 1:46 PM

## 2016-09-30 NOTE — Progress Notes (Signed)
Speech Language Pathology Treatment: Dysphagia;Cognitive-Linquistic  Patient Details Name: Douglas [name] MRN: [id number] DOB: 04/25/1941 Today's Date: 09/30/2016 Time: [id number] SLP Time Calculation (min) (ACUTE ONLY): 22 min  Assessment / Plan / Recommendation Clinical Impression  Pt with improvement in swallowing as he initiated swallow within 3-4 seconds with intake of puree and nectar-thickened liquids with minimal-moderate verbal cueing via spoon; immediate cough noted with ice chips only; pt answered yes/no questions via head nod/shake with 65% accuracy; followed one-step directives with minimal verbal/visual cues with 75% accuracy overall; pt attempting to speak, but vocal quality low and intelligibility reduced to 25-50% accuracy at word level; recommend initiating a Dysphagia 1/nectar-thickened liquid diet; ST will continue to f/u for aphasia tx and dysphagia tx with diet tolerance of upgraded diet to assess nutritional support/adequate intake.   HPI HPI: 75 y.o. male with medical history significant of HTN, [name], DM, [name] presenting with a stroke. Pt with noted R sided weakness and expressive aphasia. 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      SLP Plan  Goals updated     Recommendations  Diet recommendations: Dysphagia 1 (puree);Nectar-thick liquid Liquids provided via: Teaspoon;No straw Medication Administration: Crushed with puree Supervision: Full supervision/cueing for compensatory strategies;Staff to assist with self feeding Compensations: Slow rate;Small sips/bites;Clear throat intermittently;Other (Comment) (repetitive swallows) Postural Changes and/or Swallow Maneuvers: Seated upright 90 degrees;Upright 30-60 min after meal                General recommendations: Other(comment) (TBD) Oral Care Recommendations: Oral care QID Follow up Recommendations: Inpatient Rehab Plan: Goals updated                        Douglas [name],PAT, M.S., CCC-SLP 09/30/2016, 11:28 AM

## 2016-10-01 DIAGNOSIS — J690 Pneumonitis due to inhalation of food and vomit: Secondary | ICD-10-CM

## 2016-10-01 LAB — BASIC METABOLIC PANEL
ANION GAP: 11 (ref 5–15)
BUN: 29 mg/dL — ABNORMAL HIGH (ref 6–20)
CO2: 25 mmol/L (ref 22–32)
Calcium: 9.2 mg/dL (ref 8.9–10.3)
Chloride: 106 mmol/L (ref 101–111)
Creatinine, Ser: 1.38 mg/dL — ABNORMAL HIGH (ref 0.61–1.24)
GFR calc Af Amer: 56 mL/min — ABNORMAL LOW (ref >60)
GFR calc non Af Amer: 48 mL/min — ABNORMAL LOW (ref >60)
GLUCOSE: 213 mg/dL — AB (ref 65–99)
POTASSIUM: 3.7 mmol/L (ref 3.5–5.1)
Sodium: 142 mmol/L (ref 135–145)

## 2016-10-01 LAB — CBC
HEMATOCRIT: 26.8 % — AB (ref 39.0–52.0)
HEMOGLOBIN: 8.1 g/dL — AB (ref 13.0–17.0)
MCH: 23.7 pg — ABNORMAL LOW (ref 26.0–34.0)
MCHC: 30.2 g/dL (ref 30.0–36.0)
MCV: 78.4 fL (ref 78.0–100.0)
Platelets: 181 10*3/uL (ref 150–400)
RBC: 3.42 MIL/uL — ABNORMAL LOW (ref 4.22–5.81)
RDW: 20.9 % — ABNORMAL HIGH (ref 11.5–15.5)
WBC: 13 10*3/uL — AB (ref 4.0–10.5)

## 2016-10-01 LAB — GLUCOSE, CAPILLARY
GLUCOSE-CAPILLARY: 246 mg/dL — AB (ref 65–99)
GLUCOSE-CAPILLARY: 259 mg/dL — AB (ref 65–99)
GLUCOSE-CAPILLARY: 210 mg/dL — AB (ref 65–99)
Glucose-Capillary: 190 mg/dL — ABNORMAL HIGH (ref 65–99)

## 2016-10-01 LAB — PROCALCITONIN: Procalcitonin: 0.61 ng/mL

## 2016-10-01 MED ORDER — METOPROLOL TARTRATE 12.5 MG HALF TABLET
12.5000 mg | ORAL_TABLET | Freq: Two times a day (BID) | ORAL | Status: DC
  Administered 2016-10-01 – 2016-10-05 (×8): 12.5 mg via ORAL
  Filled 2016-10-01 (×9): qty 1

## 2016-10-01 MED ORDER — INSULIN ASPART 100 UNIT/ML ~~LOC~~ SOLN
0.0000 [IU] | Freq: Three times a day (TID) | SUBCUTANEOUS | Status: DC
  Administered 2016-10-01: 5 [IU] via SUBCUTANEOUS
  Administered 2016-10-02 (×2): 2 [IU] via SUBCUTANEOUS

## 2016-10-01 NOTE — Progress Notes (Signed)
Subjective:  Remains aphasic.  No chest pain or dyspnea. .  Objective:  Vital Signs in the last 24 hours: BP (!) 120/53 (BP Location: Left Arm)   Pulse 100   Temp 99.3 F (37.4 C) (Axillary)   Resp 20   Ht 5\' 10"  (1.778 m)   Wt 72.3 kg (159 lb 4.8 oz)   SpO2 90%   BMI 22.86 kg/m   Physical Exam: Elderly male aphasic Lungs:  Clear Cardiac:  Regular rhythm, normal S1 and S2, no S3 Extremities:  No edema present  Intake/Output from previous day: 12/16 0701 - 12/17 0700 In: 1240 [P.O.:840; IV Piggyback:400] Out: 1000 [Urine:1000]  Weight Filed Weights   09/26/16 1819 09/27/16 0054  Weight: 72.6 kg (160 lb) 72.3 kg (159 lb 4.8 oz)    Lab Results: Basic Metabolic Panel:  Recent Labs  16/07/9611/16/17 0338 10/01/16 0652  NA 139 142  K 4.1 3.7  CL 105 106  CO2 23 25  GLUCOSE 133* 213*  BUN 20 29*  CREATININE 1.33* 1.38*   CBC:  Recent Labs  09/29/16 1152 09/30/16 0338 10/01/16 0652  WBC  --  11.5* 13.0*  NEUTROABS 12.3*  --   --   HGB  --  8.5* 8.1*  HCT  --  28.3* 26.8*  MCV  --  77.7* 78.4  PLT  --  172 181   Telemetry: Sinus rhythm  Assessment/Plan:  1.  Recent strokes thought to be embolic-TEE was aborted yesterday due to comorbidities 2.  Hypertension per Mrs. for now in light of recent stroke 3.  Elevated troponin may be multifactorial and due to stroke 4.  Diabetes mellitus  Recommendations:   Will keep NPO if another attempt at TEE wanted by Dr. Pearlean BrownieSethi.     Darden PalmerW. Spencer Aune Adami, Jr.  MD [facility] Card[facility]  10/01/2016, 12:55 PM

## 2016-10-01 NOTE — Progress Notes (Signed)
STROKE TEAM PROGRESS NOTE   HISTORY OF PRESENT ILLNESS (per record) Douglas [name] is an 75 y.o. male who presents with aphasia and severe right sided weakness. His roommate found the patient on the floor at home. Last known well was today 09/27/2016 at 12:30 PM. Aphasia noted by EMS. CT head showed hypodensity in the left insular region and medial left temporal lobe, suspicious for subacute ischemic infarction. Increased density within an M2/proximal M3 branch of the left MCA was also seen on CT, suggestive of possible thrombosis. MRI and MRA of brain have been ordered. The patient is out of the IV tPA and 6 hour endovascular time windows.   His PMHx includes CKD, DM, [name] and HTN.  MRI brain reveals multiple subcentimeter foci of restricted diffusion at the cortical juxtacortical interfaces in multiple vascular territories, suspicious for shower emboli/cardioembolic strokes. Also seen is a moderate-size acute ischemic infarction involving the left insula and medial left temporal lobe. The above findings are consistent with completed strokes and there is no indication for CT perfusion study. He was admitted to the ICU stepdown unit for further evaluation and treatment.   SUBJECTIVE (INTERVAL HISTORY) Patient slightly more alert today but remains aphasic. RN  is at bedside OBJECTIVE Temp:  [97.2 F (36.2 C)-99.3 F (37.4 C)] 98.6 F (37 C) (12/17 0718) Pulse Rate:  [93-119] 104 (12/17 0718) Cardiac Rhythm: Sinus tachycardia (12/17 0718) Resp:  [19-26] 25 (12/17 0718) BP: (126-148)/(57-120) 147/65 (12/17 0718) SpO2:  [89 %-98 %] 94 % (12/17 0718)  CBC:   Recent Labs Lab 09/28/16 0414  09/29/16 1152 09/30/16 0338 10/01/16 0652  WBC 10.1  < >  --  11.5* 13.0*  NEUTROABS 7.8*  --  12.3*  --   --   HGB 9.0*  < >  --  8.5* 8.1*  HCT 29.1*  < >  --  28.3* 26.8*  MCV 76.2*  < >  --  77.7* 78.4  PLT 216  < >  --  172 181  < > = values in this interval not displayed.  Basic Metabolic  Panel:   Recent Labs Lab 09/30/16 0338 10/01/16 0652  NA 139 142  K 4.1 3.7  CL 105 106  CO2 23 25  GLUCOSE 133* 213*  BUN 20 29*  CREATININE 1.33* 1.38*  CALCIUM 9.2 9.2    Lipid Panel:     Component Value Date/Time   CHOL 173 09/27/2016 0317   TRIG 102 09/27/2016 0317   HDL 33 (L) 09/27/2016 0317   CHOLHDL 5.2 09/27/2016 0317   VLDL 20 09/27/2016 0317   LDLCALC 120 (H) 09/27/2016 0317   HgbA1c:  Lab Results  Component Value Date   HGBA1C 7.2 (H) 09/27/2016   Urine Drug Screen:     Component Value Date/Time   LABOPIA NONE DETECTED 09/30/2016 0248   COCAINSCRNUR NONE DETECTED 09/30/2016 0248   LABBENZ NONE DETECTED 09/30/2016 0248   AMPHETMU NONE DETECTED 09/30/2016 0248   THCU NONE DETECTED 09/30/2016 0248   LABBARB NONE DETECTED 09/30/2016 0248      IMAGING  Dg Chest 2 View 09/26/2016  Ill-defined opacity seen in right lung concerning for possible pneumonia or edema.   Ct Head Wo Contrast 09/26/2016 1. Suspect edema within the left insular cortex, anterior temporal lobe and left inferior frontal lobe. MRI is recommended for further evaluation. Possible mildly dense distal left MCA artery. 2. No hemorrhage. 3. Atrophy   Mr Douglas JeanBrain W Wo Contrast Mr Angiogram Head Wo Contrast [name] 09/27/2016 1. 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 Multiple other punctate foci of acute ischemia scattered throughout both cerebral and cerebellar hemispheres suggest a central embolic process. No hemorrhage or mass effect. 2. 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 3. Multiple foci of contrast enhancement within the cerebellum and right frontal lobe, with the largest focus measuring up to 2 cm. Given the other findings, these findings may be secondary to subacute ischemia. However, follow-up imaging (MRI brain with and without contrast) is recommended in 4-6 weeks to exclude the possibility of a  neoplastic process, primarily metastatic disease. 4. 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 Otherwise normal MRA of the neck.   [place] Renal 09/27/2016  Interval decrease in size of a small cyst at the upper pole RIGHT kidney. Age-related cortical atrophy without evidence of additional renal mass or hydronephrosis.   2-D echocardiogram  - Left ventricle: Distal septal hypokinesis The cavity size was normal. Wall thickness was increased in a pattern of mild LVH. Systolic function was normal. The estimated ejection fraction was in the range of 50% to 55%. Wall motion was normal; there were no regional wall motion abnormalities. - Mitral valve: There was mild regurgitation. - Atrial septum: No defect or patent foramen ovale was identified. Impressions:   No cardiac source of emboli was indentified.   PHYSICAL EXAM Frail elderly caucasian male not in distress. . Afebrile. Head is nontraumatic. Neck is supple without bruit.    Cardiac exam no murmur or gallop. Lungs are clear to auscultation. Distal pulses are well felt. Neurological Exam :  sleepy but can be aroused easily. Globally aphasic can follow only few simple midline and one-step commands. Unable to answer his name. No gaze deviation. But will not follow gaze in either direction. Does not blink to threat from either side. Right lower facial weakness. Tongue midline. Motor system exam shows mild right hemiparesis 4/5 strength  . Moves left side well against gravity and purposefully. Coordination cannot be reliably tested. Gait not tested.  ASSESSMENT/PLAN Douglas [name] is a 75 y.o. male with history of hypertension, hyperlipidemia, diabetes and chronic kidney disease presenting with marked aphasia and right-sided weakness. He did not receive IV t-PA due to delay in arrival.   Stroke:  Left  MCA, as well as scattered bilateral cerebral and cerebellar  Acute and subacute infarcts, embolic secondary to  unknown source   MRI  left MCA insular frontal infarct with scattered bilateral cerebral and cerebellar infarcts. Enhancing subacute left cerebellar and right frontal infarcts as well.  MRA head   occlusion of left MCA bifurcation  MRA neck left ICA 30% stenosis, otherwise normal  2D Echo  EF 50-55%, no source of embolus.   TEE to look for embolic source. Arranged with [facility] for tomorrow.  If positive for PFO (patent foramen ovale), check bilateral lower extremity venous dopplers to rule out DVT as possible source of stroke, also looking for possible endocarditis. (I have made patient NPO after midnight tonight).  Consider loop placement prior to discharge - likely next week. TEE  Postponed per  Cardiology    LDL 120  HgbA1c 7.2  On full dose Lovenox for suspected MI. Contraindicated in acute stroke. Changed to Lovenox 40 mg daily for VTE prophylaxis. DIET - DYS 1 Room service appropriate? Yes with Assist; Fluid consistency: Nectar Thick  aspirin 81 mg daily  prior to admission, now on aspirin 300 mg suppository daily  Ongoing aggressive stroke risk factor management  Therapy recommendations:  CIR  Disposition:  pending (lived with non-famliy roommate, not married, no kids. Niece Nok)  Hypertension  Stable  Permissive hypertension (OK if < 220/120) but gradually normalize in 5-7 days  Long-term BP goal normotensive  Hyperlipidemia  Home meds:  lipitor 80 mg daily  Resume statin when able to swallow or tube placed  LDL 120, goal < 70  Continue statin at discharge  Diabetes type II  HgbA1c 7.2 , goal < 7.0  Uncontrolled  Other Stroke Risk Factors  Advanced age  Former Cigarette smoker  ETOH use  Other Active Problems  Elevated troponins in setting of stroke, not thought to be ACS. Cardiology consulted and would not anticoagulate. OP stress test. Discussed loop recorder.  Agitated and resultant sleepiness. Unclear etiology.  Check EEG. Add depacon 750 mg IV x 1 followed by depakote ER 500 mg daily.  Urinary retention with incomplete foreskin retraction, urology placed catheter  Hospital day # 5  I have personally examined this patient, reviewed notes, independently viewed imaging studies, participated in medical decision making and plan of care.ROS completed by me personally and pertinent positives fully documented  I have made any additions or clarifications directly to the above note.  . His prognosis appears guarded. Patient's is DO NOT RESUSCITATE as per niece. We will follow his exam  carefully over the next few days if there is neurological worsening she may lean towards comfort care. Speech therapy recommend NPo for now but will follow 1-2 days and if not improving may consider panda tube for nutrition and medications. .. Discussed with niece  i and answered questions.  Greater than 50% time during this 25 minute visit was spent on counseling and coordination of care about his stroke risk, dysphagia evaluation and answered questions   Delia Heady, MD Medical Director [facility] Pager: [phone] 10/01/2016 12:55 PM      To contact Stroke Continuity provider, please refer to [email or web address]. After hours, contact General Neurology

## 2016-10-01 NOTE — Progress Notes (Addendum)
PROGRESS NOTE  CANDACE RAMUS  WUJ:[id number] DOB: 05-12-1941  DOA: 09/26/2016 PCP: Hollice Espy, MD   Brief Narrative:  75 year old male with PMH of HTN, [name], DM, CKD who presented to [facility] ED on 09/26/16 after his roommate found him in the floor of the living room and with speech difficulties. Last known well 09/26/16  and 12:30 PM. Workup confirmed bilateral embolic looking strokes and elevated troponin. Neurology and cardiology consulted.  Assessment & Plan:   Principal Problem:   Stroke (cerebrum) (HCC) Active Problems:   Hyperlipidemia   Essential hypertension   DM (diabetes mellitus) (HCC)   CKD (chronic kidney disease)   Elevated troponin   Anemia   Acute embolic stroke (HCC)   Benign essential HTN   Tachycardia   Urinary retention   Acute delirium   1. Acute bilateral strokes: Left MCA, as well as scattered bilateral cerebral and cerebellar acute and subacute infarcts. Etiology: embolic of unknown source. Resultant receptive and expressive aphasia, facial asymmetry and right upper extremity weakness. MRI brain 09/26/16: Left MCA infarct. Multiple foci of acute ischemia scattered throughout both cerebral and cerebellar hemispheres. No hemorrhage or mass effect. MRA head: Occlusion of left MCA at the level of the bifurcation. 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 2-D echo: EF 50-55 percent, no source of embolus.. Carotid Dopplers: Not requested (had MRA neck). LDL 120. A1c: 7.2. Was on aspirin 81 MG daily prior to admission, now on 325 MG daily for secondary stroke prophylaxis. Therapies evaluation : Recommend CIR. Stroke team follow-up appreciated. TEE arranged for 12/15 to rule out embolic source but couldn't be done due to acute decline. Low index of suspicion for endocarditis unless he spikes a fever or positive blood cultures or TEE for vegetations and hence holding off on ID consultation at this time. EEG without reported seizures. Remains on  Depakote. Repeat head CT 12/15 showed left MCA infarct with increase in cytotoxic edema with localized mass effect but no midline shift and other findings as below. Continue treating conservatively as we are over the weekend and if he does not improve then may have to consider palliative care consultation. TEE postponed to Monday. Overall much improved compared to 12/15. As per neurology follow-up, consider loop placement prior to discharge. 2. Acute encephalopathy: Multifactorial including acute worsening strokes, aspiration pneumonia and baseline mental status not known. Mental status has significantly improved compared to 48 hours ago. 3. Apnea/respiratory arrest 12/15/aspiration pneumonia/right perihilar opacity on chest x-ray: When patient was in endoscopy for TEE, he became somnolent, responsive only to painful stimuli, blood sugar 180, hypoxia and had periods of apnea. Subsequently came around and was more alert, hypoxia resolved. Improved. Started on IV Unasyn 12/15. Blood cultures 2 from 12/13: Negative to date. Recommend follow-up chest x-ray in 3-4 weeks. Monitor closely and if he declines, may need ICU transfer, intubation and CCM consultation. Consider transitioning to oral Augmentin on 12/18. 4.  Acute urinary retention: Urology was consulted and underwent Foley catheter placement 12/13. Reassess in a few days with voiding trial when he is close to discharge. Patient partially pulled out Foley catheter on 12/15 and had to be replaced. Hematuria secondary to trauma has almost resolved. Monitor. 5. Type II DM/IDDM with Recurrent hypoglycemia: Hold Onglyza. Likely due to nothing by mouth. Briefly treated with IV D5 infusion. Hypoglycemia resolved. Discontinued IV fluids. Diet resumed. Monitor closely and CBGs trending up. Eating well. Start SSI, sensitive. A1c 7.2. 6. Elevated troponin: No chest pain reported. Patient found  down at home.? Related to acute stroke and renal insufficiency. 2-D echo  results as below. Cardiology consultation appreciated and feels that elevated troponin is due to acute strokes. Outpatient stress test. 7. Essential hypertension: Allow permissive hypertension given recent acute stroke. Holding lisinopril. IV metoprolol changed to by mouth 12.5 twice a day. 8. Hyperlipidemia: LDL 120. Continue statins and Lopid. 9. Possible stage III chronic kidney disease: Baseline creatinine not known. Trend creatinine. Given acute urinary retention, check renal ultrasound to rule out obstruction. Renal ultrasound without hydronephrosis. Baseline creatinine probably in the 1.2-1.3 range. Stable. 10. Anemia: Baseline hemoglobin not known. Presented with hemoglobin of 10.2. May be chronic. Consider outpatient workup. No overt bleeding except traumatic hematuria. Follow CBC and consider transfusion if hemoglobin <7 g per DL. Hemoglobin has gradually declined to 8.1. 11. Hypothyroid: Resumed home dose of Synthroid. As per pharmacist's home medication reconciliation, it is not known as to which medications and when he was taking it prior to admission. 12. Dysphagia: Secondary to acute stroke. Initiated dysphagia 1 diet and nectar thickened liquids and as per RN, tolerating and eating well.     DVT prophylaxis: Lovenox discontinued due to hematuria and currently on SCDs. Code Status: DO NOT RESUSCITATE >this was discussed in detail by admitting M.D. with patient and family. Family Communication: None at bedside. I have discussed in detail with patient's niece on 09/29/16. Disposition Plan: Pending further evaluation, medical stability and therapies input. Possible CIR.   Consultants:   Neurology  Cardiology  Urology  Procedures:   Foley catheter placed by urology on 09/27/16  2-D echo 11/29/15: Study Conclusions  - Left ventricle: Distal septal hypokinesis The cavity size was   normal. Wall thickness was increased in a pattern of mild LVH.   Systolic function was normal.  The estimated ejection fraction was   in the range of 50% to 55%. Wall motion was normal; there were no   regional wall motion abnormalities. - Mitral valve: There was mild regurgitation. - Atrial septum: No defect or patent foramen ovale was identified.  Impressions:  - No cardiac source of emboli was indentified.  Antimicrobials:   None    Subjective: Looks much better than he did on 12/15. Alert and follow some simple instructions (stick out tongue, raise arm and says no). As per RN, tolerating diet well, has been alert and follow simple instructions, no agitation, hematuria clearing up and no acute issues.  Objective:  Vitals:   09/30/16 2350 10/01/16 0400 10/01/16 0718 10/01/16 1100  BP: (!) 146/61 (!) 145/65 (!) 147/65 (!) 120/53  Pulse: (!) 119 (!) 111 (!) 104 100  Resp: (!) 26 (!) 24 (!) 25 20  Temp: 97.2 F (36.2 C) 98.6 F (37 C) 98.6 F (37 C) 99.3 F (37.4 C)  TempSrc: Oral  Axillary Axillary  SpO2: 91% 92% 94% 90%  Weight:      Height:        Intake/Output Summary (Last 24 hours) at 10/01/16 1233 Last data filed at 10/01/16 1100  Gross per 24 hour  Intake             1240 ml  Output             1000 ml  Net              240 ml   Filed Weights   09/26/16 1819 09/27/16 0054  Weight: 72.6 kg (160 lb) 72.3 kg (159 lb 4.8 oz)    Examination:  General exam:  Pleasant elderly male lying comfortably in bed.  Respiratory system: Slightly diminished breath sounds in the bases but otherwise clear to auscultation. Respiratory effort normal. Cardiovascular system: S1 & S2 heard, RRR. No JVD, murmurs, rubs, gallops or clicks. No pedal edema. Telemetry: Mild sinus tachycardia. 6 beat NSVT on 12/16 p.m. Gastrointestinal system: Abdomen is nondistended, soft and nontender. No organomegaly or masses felt. Normal bowel sounds heard. Foley catheter with pink discoloration of urine (hematuria significantly better than 48 hours ago). Central nervous system: Alert and  follow simple instructions. Facial asymmetry and aphasia present. Extremities: Left upper and bilateral lower extremities with seemingly normal power. Right upper extremity, grade 4 x 5 with pronator drift. Skin: Bruising of dorsum of right hand which appears old. Psychiatry: Judgement and insight -impaired.     Data Reviewed: I have personally reviewed following labs and imaging studies  CBC:  Recent Labs Lab 09/26/16 1834 09/28/16 0414 09/29/16 0606 09/29/16 1152 09/30/16 0338 10/01/16 0652  WBC  --  10.1 15.8*  --  11.5* 13.0*  NEUTROABS  --  7.8*  --  12.3*  --   --   HGB 10.2* 9.0* 8.7*  --  8.5* 8.1*  HCT 30.0* 29.1* 27.9*  --  28.3* 26.8*  MCV  --  76.2* 76.6*  --  77.7* 78.4  PLT  --  216 181  --  172 181   Basic Metabolic Panel:  Recent Labs Lab 09/26/16 1800 09/26/16 1834 09/28/16 0414 09/29/16 0606 09/30/16 0338 10/01/16 0652  NA 138 138 135 137 139 142  K 3.7 3.8 4.0 3.9 4.1 3.7  CL 105 105 104 105 105 106  CO2 21*  --  21* 23 23 25   GLUCOSE 89 85 185* 175* 133* 213*  BUN 18 20 19 18 20  29*  CREATININE 1.62* 1.60* 1.41* 1.29* 1.33* 1.38*  CALCIUM 9.4  --  8.9 9.3 9.2 9.2   GFR: Estimated Creatinine Clearance: 47.3 mL/min (by C-G formula based on SCr of 1.38 mg/dL (H)). Liver Function Tests:  Recent Labs Lab 09/26/16 1800  AST 31  ALT 15*  ALKPHOS 135*  BILITOT 1.1  PROT 6.7  ALBUMIN 3.3*   No results for input(s): LIPASE, AMYLASE in the last 168 hours. No results for input(s): AMMONIA in the last 168 hours. Coagulation Profile: No results for input(s): INR, PROTIME in the last 168 hours. Cardiac Enzymes:  Recent Labs Lab 09/27/16 0317 09/27/16 0810 09/27/16 1607  TROPONINI 0.62* 0.64* 0.54*   BNP (last 3 results) No results for input(s): PROBNP in the last 8760 hours. HbA1C: No results for input(s): HGBA1C in the last 72 hours. CBG:  Recent Labs Lab 09/30/16 0825 09/30/16 1151 09/30/16 1616 09/30/16 2120 10/01/16 0901    GLUCAP 133* 151* 144* 222* 190*   Lipid Profile: No results for input(s): CHOL, HDL, LDLCALC, TRIG, CHOLHDL, LDLDIRECT in the last 72 hours. Thyroid Function Tests: No results for input(s): TSH, T4TOTAL, FREET4, T3FREE, THYROIDAB in the last 72 hours. Anemia Panel: No results for input(s): VITAMINB12, FOLATE, FERRITIN, TIBC, IRON, RETICCTPCT in the last 72 hours.  Sepsis Labs:  Recent Labs Lab 09/26/16 1834 09/29/16 1152 09/30/16 0338 10/01/16 0652  PROCALCITON  --  0.27 0.26 0.61  LATICACIDVEN 1.28  --   --   --     Recent Results (from the past 240 hour(s))  Culture, blood (routine x 2)     Status: None (Preliminary result)   Collection Time: 09/27/16  3:20 AM  Result Value Ref  Range Status   Specimen Description BLOOD RIGHT ANTECUBITAL  Final   Special Requests BOTTLES DRAWN AEROBIC AND ANAEROBIC 10CC  Final   Culture NO GROWTH 3 DAYS  Final   Report Status PENDING  Incomplete  Culture, blood (routine x 2)     Status: None (Preliminary result)   Collection Time: 09/27/16  3:28 AM  Result Value Ref Range Status   Specimen Description BLOOD RIGHT FOREARM  Final   Special Requests BOTTLES DRAWN AEROBIC ONLY 5CC  Final   Culture NO GROWTH 3 DAYS  Final   Report Status PENDING  Incomplete  MRSA PCR Screening     Status: None   Collection Time: 09/27/16  3:40 AM  Result Value Ref Range Status   MRSA by PCR NEGATIVE NEGATIVE Final    Comment:        The GeneXpert MRSA Assay (FDA approved for NASAL specimens only), is one component of a comprehensive MRSA colonization surveillance program. It is not intended to diagnose MRSA infection nor to guide or monitor treatment for MRSA infections.          Radiology Studies: No results found.      Scheduled Meds: . ampicillin-sulbactam (UNASYN) IV  3 g Intravenous Q8H  . aspirin  300 mg Rectal Daily   Or  . aspirin  325 mg Oral Daily  . atorvastatin  80 mg Oral q1800  . divalproex  500 mg Oral Daily  .  levothyroxine  75 mcg Oral QAC breakfast  . metoprolol  2.5 mg Intravenous Q6H   Continuous Infusions:    LOS: 5 days       Thayer Embleton, MD [facility] Pager [phone]  If 7PM-7AM, please contact night-coverage [email or web address] Password [facility] 10/01/2016, 12:33 PM

## 2016-10-02 LAB — CULTURE, BLOOD (ROUTINE X 2)
CULTURE: NO GROWTH
Culture: NO GROWTH

## 2016-10-02 LAB — CBC
HEMATOCRIT: 25.9 % — AB (ref 39.0–52.0)
Hemoglobin: 7.9 g/dL — ABNORMAL LOW (ref 13.0–17.0)
MCH: 23.7 pg — AB (ref 26.0–34.0)
MCHC: 30.5 g/dL (ref 30.0–36.0)
MCV: 77.8 fL — AB (ref 78.0–100.0)
Platelets: 184 10*3/uL (ref 150–400)
RBC: 3.33 MIL/uL — ABNORMAL LOW (ref 4.22–5.81)
RDW: 20.6 % — AB (ref 11.5–15.5)
WBC: 12.5 10*3/uL — AB (ref 4.0–10.5)

## 2016-10-02 LAB — GLUCOSE, CAPILLARY
GLUCOSE-CAPILLARY: 187 mg/dL — AB (ref 65–99)
GLUCOSE-CAPILLARY: 166 mg/dL — AB (ref 65–99)
GLUCOSE-CAPILLARY: 143 mg/dL — AB (ref 65–99)
Glucose-Capillary: 169 mg/dL — ABNORMAL HIGH (ref 65–99)
Glucose-Capillary: 153 mg/dL — ABNORMAL HIGH (ref 65–99)
Glucose-Capillary: 164 mg/dL — ABNORMAL HIGH (ref 65–99)

## 2016-10-02 MED ORDER — INSULIN ASPART 100 UNIT/ML ~~LOC~~ SOLN
0.0000 [IU] | Freq: Every day | SUBCUTANEOUS | Status: DC
  Administered 2016-10-03 – 2016-10-04 (×2): 2 [IU] via SUBCUTANEOUS

## 2016-10-02 MED ORDER — INSULIN ASPART 100 UNIT/ML ~~LOC~~ SOLN
0.0000 [IU] | Freq: Three times a day (TID) | SUBCUTANEOUS | Status: DC
  Administered 2016-10-02: 2 [IU] via SUBCUTANEOUS
  Administered 2016-10-03 (×2): 5 [IU] via SUBCUTANEOUS
  Administered 2016-10-03 – 2016-10-04 (×2): 3 [IU] via SUBCUTANEOUS
  Administered 2016-10-04: 8 [IU] via SUBCUTANEOUS
  Administered 2016-10-04: 5 [IU] via SUBCUTANEOUS
  Administered 2016-10-05: 11 [IU] via SUBCUTANEOUS
  Administered 2016-10-05: 8 [IU] via SUBCUTANEOUS
  Administered 2016-10-05: 3 [IU] via SUBCUTANEOUS

## 2016-10-02 MED ORDER — AMOXICILLIN-POT CLAVULANATE 875-125 MG PO TABS
1.0000 | ORAL_TABLET | Freq: Two times a day (BID) | ORAL | Status: DC
  Administered 2016-10-02 – 2016-10-05 (×7): 1 via ORAL
  Filled 2016-10-02 (×8): qty 1

## 2016-10-02 MED ORDER — INSULIN GLARGINE 100 UNIT/ML ~~LOC~~ SOLN
10.0000 [IU] | Freq: Every day | SUBCUTANEOUS | Status: DC
  Administered 2016-10-02 – 2016-10-04 (×3): 10 [IU] via SUBCUTANEOUS
  Filled 2016-10-02 (×4): qty 0.1

## 2016-10-02 MED ORDER — LORAZEPAM 2 MG/ML IJ SOLN: 1.0000 mg | Freq: Once | INTRAMUSCULAR | Status: DC

## 2016-10-02 MED ORDER — ENOXAPARIN SODIUM 40 MG/0.4ML ~~LOC~~ SOLN
40.0000 mg | SUBCUTANEOUS | Status: DC
  Administered 2016-10-02 – 2016-10-04 (×3): 40 mg via SUBCUTANEOUS
  Filled 2016-10-02 (×3): qty 0.4

## 2016-10-02 NOTE — Care Management Important Message (Signed)
Important Message  Patient Details  Name: Douglas CohoJames L Mizuno MRN: [id number] Date of Birth: 09/04/1941   Medicare Important Message Given:  Yes    Azadeh Hyder Abena 10/02/2016, 10:54 AM

## 2016-10-02 NOTE — Progress Notes (Signed)
STROKE TEAM PROGRESS NOTE   SUBJECTIVE (INTERVAL HISTORY) Patient was seen and examined this morning. He is in no acute distress. He continues to have global aphasia but more alert and responsive than prior. Pending TEE and loop recorder.  OBJECTIVE Temp:  [97.4 F (36.3 C)-98.7 F (37.1 C)] 97.6 F (36.4 C) (12/18 1600) Pulse Rate:  [78-105] 88 (12/18 1600) Cardiac Rhythm: Sinus tachycardia (12/18 0918) Resp:  [16-23] 20 (12/18 1600) BP: (110-153)/(55-68) 133/62 (12/18 1600) SpO2:  [91 %-100 %] 100 % (12/18 1600)  CBC:   Recent Labs Lab 09/28/16 0414  09/29/16 1152  10/01/16 0652 10/02/16 0533  WBC 10.1  < >  --   < > 13.0* 12.5*  NEUTROABS 7.8*  --  12.3*  --   --   --   HGB 9.0*  < >  --   < > 8.1* 7.9*  HCT 29.1*  < >  --   < > 26.8* 25.9*  MCV 76.2*  < >  --   < > 78.4 77.8*  PLT 216  < >  --   < > 181 184  < > = values in this interval not displayed.  Basic Metabolic Panel:   Recent Labs Lab 09/30/16 0338 10/01/16 0652  NA 139 142  K 4.1 3.7  CL 105 106  CO2 23 25  GLUCOSE 133* 213*  BUN 20 29*  CREATININE 1.33* 1.38*  CALCIUM 9.2 9.2    Lipid Panel:     Component Value Date/Time   CHOL 173 09/27/2016 0317   TRIG 102 09/27/2016 0317   HDL 33 (L) 09/27/2016 0317   CHOLHDL 5.2 09/27/2016 0317   VLDL 20 09/27/2016 0317   LDLCALC 120 (H) 09/27/2016 0317   HgbA1c:  Lab Results  Component Value Date   HGBA1C 7.2 (H) 09/27/2016   Urine Drug Screen:     Component Value Date/Time   LABOPIA NONE DETECTED 09/30/2016 0248   COCAINSCRNUR NONE DETECTED 09/30/2016 0248   LABBENZ NONE DETECTED 09/30/2016 0248   AMPHETMU NONE DETECTED 09/30/2016 0248   THCU NONE DETECTED 09/30/2016 0248   LABBARB NONE DETECTED 09/30/2016 0248      IMAGING I have personally reviewed the radiological images below and agree with the radiology interpretations.  Dg Chest 2 View 09/26/2016  Ill-defined opacity seen in right lung concerning for possible pneumonia or  edema.   Ct Head Wo Contrast 09/26/2016 1. Suspect edema within the left insular cortex, anterior temporal lobe and left inferior frontal lobe. MRI is recommended for further evaluation. Possible mildly dense distal left MCA artery.  2. No hemorrhage.  3. Atrophy   Ct Head Wo Contrast 09/29/2016 Evolution of left middle cerebral artery distribution infarct in the left posterior frontal and superior left temporal regions with increase in cytotoxic edema in this area. There is localized mass effect from the cytotoxic edema but no midline shift. There is a small infarct in the right cerebellum medially and posteriorly showing evolution from recent study. No hemorrhage. Note that there is hyperdensity in the distal left middle cerebral artery, likely thrombus in this region. There is mild atrophy with mild periventricular small vessel disease.  Mr Laqueta JeanBrain W Wo Contrast Mr Angiogram Head Wo Contrast Mr Angiogram Neck W Or Wo Contrast 09/27/2016 1. 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  Multiple other punctate foci of acute ischemia scattered throughout both cerebral and cerebellar hemispheres suggest a central embolic process. No hemorrhage or mass effect.  2. 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  3. Multiple foci of contrast enhancement within the cerebellum and right frontal lobe, with the largest focus measuring up to 2 cm. Given the other findings, these findings may be secondary to subacute ischemia. However, follow-up imaging (MRI brain with and without contrast) is recommended in 4-6 weeks to exclude the possibility of a neoplastic process, primarily metastatic disease.  4. 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 Otherwise normal MRA of the neck.   2-D echocardiogram  - Left ventricle: Distal septal hypokinesis The cavity size was normal. Wall thickness was increased in a pattern of mild LVH.  Systolic function was normal. The estimated ejection fraction was in the range of 50% to 55%. Wall motion was normal; there were no regional wall motion abnormalities. - Mitral valve: There was mild regurgitation. - Atrial septum: No defect or patent foramen ovale was identified. Impressions:   No cardiac source of emboli was indentified.  TEE pending   PHYSICAL EXAM  Temp:  [97.4 F (36.3 C)-98.7 F (37.1 C)] 97.6 F (36.4 C) (12/18 1600) Pulse Rate:  [78-105] 88 (12/18 1600) Resp:  [16-23] 20 (12/18 1600) BP: (110-153)/(55-68) 133/62 (12/18 1600) SpO2:  [91 %-100 %] 100 % (12/18 1600)  General - Well nourished, well developed, in no apparent distress, sleepy initially.  Ophthalmologic - Fundi not visualized due to noncooperation  Cardiovascular - Regular rate and rhythm.  Neuro - initially sleepy, however able to open eyes on voice. More awake alert, however continued to have global aphasia, not to follow commands except able to open/close eyes on command. Not able to name or repeat. Not blinking to visual threat on the right, PERRL, left gaze preference, able to cross midline, right facial droop, tongue in midline in mouth, RUE 4+/5, RLE 3/5, LUE and LLE 5-/5, DTR 1+, and right Babinski positive. Sensation, pronation and gait not tested.    ASSESSMENT/PLAN Mr. Douglas [name] is a 75 y.o. male with history of HTN, [name], DM [name] presenting with global aphasia and right-sided weakness. He did not receive IV t-PA due to delay in arrival.   Stroke:  Embolic shower largest at left  MCA, but also include scattered bilateral cerebral and cerebellar, embolic secondary to unknown source.   MRI left MCA insular frontal infarct with scattered bilateral cerebral and cerebellar infarcts. Enhancing subacute left cerebellar and right frontal infarcts as well.  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   TEE  pending  Consider loop placement prior to discharge   LDL 120  HgbA1c 7.2  Lovenox 40 mg daily for VTE prophylaxis. Diet NPO time specified Except for: Sips with Meds  aspirin 81 mg daily prior to admission, now on aspirin 325 mg daily.   Ongoing aggressive stroke risk factor management  Therapy recommendations: pending  Disposition:  pending  Hypertension  Stable  Permissive hypertension (OK if < 220/120) but gradually normalize in 5-7 days  Long-term BP goal normotensive  Hyperlipidemia  Home meds:  lipitor 80 mg daily  Resumed statin   LDL 120, goal < 70  Continue Lipitor 80 at discharge  Diabetes type II  HgbA1c 7.2 , goal < 7.0  Uncontrolled  SSI  On Lantus  DM nurse following  Other Stroke Risk Factors  Advanced age  Former Cigarette smoker  ETOH use  Other Active Problems  Agitated  and resultant sleepiness. Unclear etiology. Check EEG. Add depacon 750 mg IV x 1 followed by depakote ER 500 mg daily.  Urinary retention with incomplete foreskin retraction, urology placed catheter  Hospital day # 6  Marvel Plan, MD PhD Stroke Neurology 10/02/2016 6:55 PM    To contact Stroke Continuity provider, please refer to [email or web address]. After hours, contact General Neurology

## 2016-10-02 NOTE — NC FL2 (Signed)
[place] MEDICAID FL2 LEVEL OF CARE SCREENING TOOL     IDENTIFICATION  Patient Name: Douglas [name] Birthdate: 04/09/1941 Sex: male Admission Date (Current Location): 09/26/2016  [facility] [place]   Facility and Address:  [facility], [address], [place], [place] [address]      Provider Number: [id number]  Attending Physician Name and Address:  Elease EtienneAnand D Hongalgi, MD  Relative Name and Phone Number:       Current Level of Care: Hospital Recommended Level of Care: Skilled Nursing Facility Prior Approval Number:    Date Approved/Denied:   PASRR Number: [id number]  Discharge Plan: SNF    Current Diagnoses: Patient Active Problem List   Diagnosis Date Noted  . Urinary retention   . Acute delirium   . CKD (chronic kidney disease) 09/27/2016  . Elevated troponin 09/27/2016  . Anemia 09/27/2016  . Acute embolic stroke (HCC)   . Acute systolic heart failure (HCC)   . Benign essential HTN   . Tachycardia   . 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 Hyperlipidemia 11/30/2013  . Essential hypertension 11/30/2013  . DM (diabetes mellitus) (HCC) 11/30/2013    Orientation RESPIRATION BLADDER Height & Weight     Self  Normal Continent, Indwelling catheter Weight: 159 lb 4.8 oz (72.3 kg) Height:  5\' 10"  (177.8 cm)  BEHAVIORAL SYMPTOMS/MOOD NEUROLOGICAL BOWEL NUTRITION STATUS   (Acute delirium)  (Stroke) Incontinent Diet (See discharge summary once available)  AMBULATORY STATUS COMMUNICATION OF NEEDS Skin   Extensive Assist Non-Verbally (Incomprehensible sounds, Gestures appropriately) Skin abrasions, Bruising                       Personal Care Assistance Level of Assistance  Total care       Total Care Assistance: Maximum assistance   Functional Limitations Info  Sight, Hearing, Speech Sight Info: Adequate Hearing Info: Adequate Speech Info: Impaired    SPECIAL CARE FACTORS FREQUENCY  PT (By licensed PT),  Blood pressure, Diabetic urine testing, OT (By licensed OT), Speech therapy     PT Frequency: 5 x week OT Frequency: 5 x week     Speech Therapy Frequency: 5 x week      Contractures Contractures Info: Not present    Additional Factors Info  Code Status, Allergies Code Status Info: DNR Allergies Info: NKDA           Current Medications (10/02/2016):  This is the current hospital active medication list Current Facility-Administered Medications  Medication Dose Route Frequency Provider Last Rate Last Dose  . acetaminophen (TYLENOL) tablet 650 mg  650 mg Oral Q4H PRN Jonah BlueJennifer Yates, MD      . aspirin tablet 325 mg  325 mg Oral Daily Jonah BlueJennifer Yates, MD   325 mg at 10/02/16 [phone]  . atorvastatin (LIPITOR) tablet 80 mg  80 mg Oral q1800 Elease EtienneAnand D Hongalgi, MD   80 mg at 10/01/16 1713  . divalproex (DEPAKOTE) DR tablet 500 mg  500 mg Oral Daily Layne BentonSharon L Biby, NP   500 mg at 10/02/16 0919  . insulin aspart (novoLOG) injection 0-15 Units  0-15 Units Subcutaneous TID WC Elease EtienneAnand D Hongalgi, MD      . insulin aspart (novoLOG) injection 0-5 Units  0-5 Units Subcutaneous QHS Elease EtienneAnand D Hongalgi, MD      . insulin glargine (LANTUS) injection 10 Units  10 Units Subcutaneous Daily Elease EtienneAnand D Hongalgi, MD      . levothyroxine (SYNTHROID, LEVOTHROID)  tablet 75 mcg  75 mcg Oral QAC breakfast Elease EtienneAnand D Hongalgi, MD   75 mcg at 10/02/16 [id number]  . LORazepam (ATIVAN) injection 1 mg  1 mg Intravenous Once Leanne ChangKatherine P Schorr, NP      . metoprolol tartrate (LOPRESSOR) tablet 12.5 mg  12.5 mg Oral BID Elease EtienneAnand D Hongalgi, MD   12.5 mg at 10/02/16 [id number]  . RESOURCE THICKENUP CLEAR   Oral PRN Elease EtienneAnand D Hongalgi, MD      . senna-docusate (Senokot-S) tablet 1 tablet  1 tablet Oral QHS PRN Jonah BlueJennifer Yates, MD         Discharge Medications: Please see discharge summary for a list of discharge medications.  Relevant Imaging Results:  Relevant Lab Results:   Additional Information SS#: [id number]  Margarito LinerSarah C Lajean Boese,  LCSW

## 2016-10-02 NOTE — Progress Notes (Signed)
PROGRESS NOTE  Douglas CohoJames L [name]  WUJ:[id number] DOB: 01/10/1941  DOA: 09/26/2016 PCP: Hollice EspyGATES,DONNA RUTH, MD   Brief Narrative:  75 year old male with PMH of HTN, [name], DM, CKD who [facility] on 09/26/16 after his roommate found him in the floor of the living room and with speech difficulties. Last known well 09/26/16  and 12:30 PM. Workup confirmed bilateral embolic looking strokes and elevated troponin. Neurology and cardiology consulted. Had an episode of decline on 12/15 with unresponsiveness,? Apnea/hypoxia. Since then overall improved. Treating for aspiration pneumonia. TEE possibly 12/18. Possibly CIR DC when medically stable.  Assessment & Plan:   Principal Problem:   Stroke (cerebrum) (HCC) Active Problems:   Hyperlipidemia   Essential hypertension   DM (diabetes mellitus) (HCC)   CKD (chronic kidney disease)   Elevated troponin   Anemia   Acute embolic stroke (HCC)   Benign essential HTN   Tachycardia   Urinary retention   Acute delirium   1. Acute bilateral strokes: Left MCA, as well as scattered bilateral cerebral and cerebellar acute and subacute infarcts. Etiology: embolic of unknown source. Resultant receptive and expressive aphasia, facial asymmetry and right upper extremity weakness. MRI brain 09/26/16: Left MCA infarct. Multiple foci of acute ischemia scattered throughout both cerebral and cerebellar hemispheres. No hemorrhage or mass effect. MRA head: Occlusion of left MCA at the level of the bifurcation. 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 2-D echo: EF 50-55 percent, no source of embolus.. Carotid Dopplers: Not requested (had MRA neck). LDL 120. A1c: 7.2. Was on aspirin 81 MG daily prior to admission, now on 325 MG daily for secondary stroke prophylaxis. Therapies evaluation : Recommend CIR. Stroke team follow-up appreciated. TEE arranged for 12/15 to rule out embolic source but couldn't be done due to acute decline. Low index of  suspicion for endocarditis unless he spikes a fever or positive blood cultures or TEE for vegetations and hence holding off on ID consultation at this time. EEG without reported seizures. Remains on Depakote. Repeat head CT 12/15 showed left MCA infarct with increase in cytotoxic edema with localized mass effect but no midline shift and other findings as below. TEE possibly 12/18. Overall much improved compared to 12/15. As per neurology follow-up, consider loop placement prior to discharge. 2. Acute encephalopathy: Multifactorial including acute worsening strokes, aspiration pneumonia and baseline mental status not known. Mental status has significantly improved compared to 09/29/16. 3. Apnea/respiratory arrest 12/15/aspiration pneumonia/right perihilar opacity on chest x-ray: When patient was in endoscopy for TEE on 12/15, he became somnolent, responsive only to painful stimuli, blood sugar 180, hypoxia and had periods of apnea. Subsequently came around and was more alert, hypoxia resolved. Improved. Started on IV Unasyn 12/15. Blood cultures 2 from 12/13: Negative to date. Recommend follow-up chest x-ray in 3-4 weeks. Transitioned to oral Augmentin on 12/18 and complete total 7 days treatment. 4.  Acute urinary retention: Urology was consulted and underwent Foley catheter placement 12/13. Patient partially pulled out Foley catheter on 12/15 and had to be replaced. Hematuria secondary to trauma has almost resolved. As per urology 12/15: Continue Foley for at least 5 days. 5. Type II DM/IDDM with Recurrent hypoglycemia: Hold Onglyza. Likely due to nothing by mouth. Briefly treated with IV D5 infusion. Hypoglycemia resolved. Discontinued IV fluids. Diet resumed. Monitor closely and CBGs trending up. Eating well. A1c 7.2. Serous is now in the 200s. Diabetes coordinator input appreciated. Start Lantus 10 units daily and change SSI to moderate scale with  bedtime cover. 6. Elevated troponin: No chest pain  reported. Patient found down at home.? Related to acute stroke and renal insufficiency. 2-D echo results as below. Cardiology consultation appreciated and feels that elevated troponin is due to acute strokes. Outpatient stress test. 7. Essential hypertension: Allow permissive hypertension given recent acute stroke. Holding lisinopril. IV metoprolol changed to by mouth 12.5 twice a day. 8. Hyperlipidemia: LDL 120. Continue statins and Lopid. 9. Possible stage III chronic kidney disease: Baseline creatinine not known. Trend creatinine. Given acute urinary retention, check renal ultrasound to rule out obstruction. Renal ultrasound without hydronephrosis. Baseline creatinine probably in the 1.2-1.3 range. Stable. 10. Anemia: Baseline hemoglobin not known. Presented with hemoglobin of 10.2. May be chronic. Consider outpatient workup. No overt bleeding except traumatic hematuria. Follow CBC and consider transfusion if hemoglobin <7 g per DL. Hemoglobin has gradually declined to 7.9. 11. Hypothyroid: Resumed home dose of Synthroid. As per pharmacist's home medication reconciliation, it is not known as to which medications and when he was taking it prior to admission. 12. Dysphagia: Secondary to acute stroke. Initiated dysphagia 1 diet and nectar thickened liquids and as per RN, tolerating and eating well.     DVT prophylaxis: Lovenox discontinued due to hematuria and currently on SCDs. Code Status: DO NOT RESUSCITATE >this was discussed in detail by admitting M.D. with patient and family. Family Communication: None at bedside.  Disposition Plan: Pending further evaluation, medical stability and therapies input. Possible CIR.   Consultants:   Neurology  Cardiology  Urology  Procedures:   Foley catheter placed by urology on 09/27/16  2-D echo 11/29/15: Study Conclusions  - Left ventricle: Distal septal hypokinesis The cavity size was   normal. Wall thickness was increased in a pattern of mild  LVH.   Systolic function was normal. The estimated ejection fraction was   in the range of 50% to 55%. Wall motion was normal; there were no   regional wall motion abnormalities. - Mitral valve: There was mild regurgitation. - Atrial septum: No defect or patent foramen ovale was identified.  Impressions:  - No cardiac source of emboli was indentified.  Antimicrobials:   None    Subjective: Patient follows simple instructions. Able to comprehend questions but unintelligible speech due to his aphasia and dysarthria. As per RN, no acute issues. Hematuria has cleared. Tolerating diet. Occasional mild confusion but no agitation.  Objective:  Vitals:   10/02/16 0310 10/02/16 0312 10/02/16 0400 10/02/16 0918  BP: (!) 132/55 (!) 132/55 (!) 110/57 (!) 153/63  Pulse:  (!) 103 93 (!) 105  Resp: (!) 23 16 (!) 22 (!) 21  Temp:  98 F (36.7 C)  98.4 F (36.9 C)  TempSrc:  Oral  Oral  SpO2:  94% 94% 96%  Weight:      Height:        Intake/Output Summary (Last 24 hours) at 10/02/16 1144 Last data filed at 10/02/16 0314  Gross per 24 hour  Intake              900 ml  Output              700 ml  Net              200 ml   Filed Weights   09/26/16 1819 09/27/16 0054  Weight: 72.6 kg (160 lb) 72.3 kg (159 lb 4.8 oz)    Examination:  General exam: Pleasant elderly male lying comfortably in bed.  Respiratory system: Slightly diminished breath  sounds in the bases but otherwise clear to auscultation. Respiratory effort normal. Cardiovascular system: S1 & S2 heard, RRR. No JVD, murmurs, rubs, gallops or clicks. No pedal edema. Telemetry: Mild sinus tachycardia in the 100's.. Gastrointestinal system: Abdomen is nondistended, soft and nontender. No organomegaly or masses felt. Normal bowel sounds heard. Foley catheter >hematuria resolved. Central nervous system: Alert and follow simple instructions. Facial asymmetry and aphasia present. Extremities: Left upper and bilateral lower  extremities with seemingly normal power. Right upper extremity, grade 4 x 5 with pronator drift. Skin: Bruising of dorsum of right hand which appears old. Psychiatry: Judgement and insight -impaired.     Data Reviewed: I have personally reviewed following labs and imaging studies  CBC:  Recent Labs Lab 09/28/16 0414 09/29/16 0606 09/29/16 1152 09/30/16 0338 10/01/16 0652 10/02/16 0533  WBC 10.1 15.8*  --  11.5* 13.0* 12.5*  NEUTROABS 7.8*  --  12.3*  --   --   --   HGB 9.0* 8.7*  --  8.5* 8.1* 7.9*  HCT 29.1* 27.9*  --  28.3* 26.8* 25.9*  MCV 76.2* 76.6*  --  77.7* 78.4 77.8*  PLT 216 181  --  172 181 184   Basic Metabolic Panel:  Recent Labs Lab 09/26/16 1800 09/26/16 1834 09/28/16 0414 09/29/16 0606 09/30/16 0338 10/01/16 0652  NA 138 138 135 137 139 142  K 3.7 3.8 4.0 3.9 4.1 3.7  CL 105 105 104 105 105 106  CO2 21*  --  21* 23 23 25   GLUCOSE 89 85 185* 175* 133* 213*  BUN 18 20 19 18 20  29*  CREATININE 1.62* 1.60* 1.41* 1.29* 1.33* 1.38*  CALCIUM 9.4  --  8.9 9.3 9.2 9.2   GFR: Estimated Creatinine Clearance: 47.3 mL/min (by C-G formula based on SCr of 1.38 mg/dL (H)). Liver Function Tests:  Recent Labs Lab 09/26/16 1800  AST 31  ALT 15*  ALKPHOS 135*  BILITOT 1.1  PROT 6.7  ALBUMIN 3.3*   No results for input(s): LIPASE, AMYLASE in the last 168 hours. No results for input(s): AMMONIA in the last 168 hours. Coagulation Profile: No results for input(s): INR, PROTIME in the last 168 hours. Cardiac Enzymes:  Recent Labs Lab 09/27/16 0317 09/27/16 0810 09/27/16 1607  TROPONINI 0.62* 0.64* 0.54*   BNP (last 3 results) No results for input(s): PROBNP in the last 8760 hours. HbA1C: No results for input(s): HGBA1C in the last 72 hours. CBG:  Recent Labs Lab 10/01/16 0901 10/01/16 1144 10/01/16 1704 10/01/16 2104 10/02/16 0921  GLUCAP 190* 246* 259* 210* 169*   Lipid Profile: No results for input(s): CHOL, HDL, LDLCALC, TRIG, CHOLHDL,  LDLDIRECT in the last 72 hours. Thyroid Function Tests: No results for input(s): TSH, T4TOTAL, FREET4, T3FREE, THYROIDAB in the last 72 hours. Anemia Panel: No results for input(s): VITAMINB12, FOLATE, FERRITIN, TIBC, IRON, RETICCTPCT in the last 72 hours.  Sepsis Labs:  Recent Labs Lab 09/26/16 1834 09/29/16 1152 09/30/16 0338 10/01/16 0652  PROCALCITON  --  0.27 0.26 0.61  LATICACIDVEN 1.28  --   --   --     Recent Results (from the past 240 hour(s))  Culture, blood (routine x 2)     Status: None (Preliminary result)   Collection Time: 09/27/16  3:20 AM  Result Value Ref Range Status   Specimen Description BLOOD RIGHT ANTECUBITAL  Final   Special Requests BOTTLES DRAWN AEROBIC AND ANAEROBIC 10CC  Final   Culture NO GROWTH 4 DAYS  Final  Report Status PENDING  Incomplete  Culture, blood (routine x 2)     Status: None (Preliminary result)   Collection Time: 09/27/16  3:28 AM  Result Value Ref Range Status   Specimen Description BLOOD RIGHT FOREARM  Final   Special Requests BOTTLES DRAWN AEROBIC ONLY 5CC  Final   Culture NO GROWTH 4 DAYS  Final   Report Status PENDING  Incomplete  MRSA PCR Screening     Status: None   Collection Time: 09/27/16  3:40 AM  Result Value Ref Range Status   MRSA by PCR NEGATIVE NEGATIVE Final    Comment:        The GeneXpert MRSA Assay (FDA approved for NASAL specimens only), is one component of a comprehensive MRSA colonization surveillance program. It is not intended to diagnose MRSA infection nor to guide or monitor treatment for MRSA infections.          Radiology Studies: No results found.      Scheduled Meds: . ampicillin-sulbactam (UNASYN) IV  3 g Intravenous Q8H  . aspirin  325 mg Oral Daily  . atorvastatin  80 mg Oral q1800  . divalproex  500 mg Oral Daily  . insulin aspart  0-9 Units Subcutaneous TID WC  . levothyroxine  75 mcg Oral QAC breakfast  . LORazepam  1 mg Intravenous Once  . metoprolol tartrate  12.5  mg Oral BID   Continuous Infusions:    LOS: 6 days       Lachelle Rissler, MD [facility] Pager [phone]  If 7PM-7AM, please contact night-coverage [email or web address] Password [email or web address] 10/02/2016, 11:44 AM

## 2016-10-02 NOTE — Progress Notes (Signed)
Physical Therapy Treatment Patient Details Name: Douglas [name] MRN: [id number] DOB: 05/07/1941 Today's Date: 10/02/2016    History of Present Illness Douglas [name] a 75 y.o.malewith medical history significant of HTN, [name], DM, [name] presenting with a stroke. Pt with noted R sided weakness and expressive aphasia. 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    PT Comments    This pm, patient more alert and able to participate with PT.  Requiring min-mod assist to move supine <> sit.  Patient with improved sitting balance, requiring min guard assist.  It appears patient continues to fluctuate regarding cognition and performance with mobility.   Follow Up Recommendations  SNF;Supervision/Assistance - 24 hour     Equipment Recommendations  Other (comment) (TBD)    Recommendations for Other Services       Precautions / Restrictions Precautions Precautions: Fall Precaution Comments: R sided inattention Restrictions Weight Bearing Restrictions: No    Mobility  Bed Mobility Overal bed mobility: Needs Assistance Bed Mobility: Supine to Sit;Sit to Supine     Supine to sit: Min assist;Mod assist Sit to supine: Min assist   General bed mobility comments: Patient able to move LE's to EOB.  Assist to raise trunk to sitting position.  Once upright, patient able to maintain sitting balance with UE support and min guard assist for safety.  Patient sat EOB x 6 minutes.  Returned to supine with min assist.  Transfers                 General transfer comment: NT  Ambulation/Gait                 Stairs            Wheelchair Mobility    Modified Rankin (Stroke Patients Only) Modified Rankin (Stroke Patients Only) Pre-Morbid Rankin Score: No symptoms Modified Rankin: Moderately severe disability     Balance   Sitting-balance support: Bilateral upper extremity supported;Feet unsupported Sitting balance-Leahy Scale:  Poor Sitting balance - Comments: Patient requires bil UE support on bed to maintain balance.  No physical assist needed.                            Cognition Arousal/Alertness: Awake/alert Behavior During Therapy: Flat affect Overall Cognitive Status: Difficult to assess                 General Comments: Patient alert with eyes open as PT entered room.  Able to follow commands at least 75% of the time.  Answered yes/no questions fairly appropriately.  Patient smiled x2 as PT joked with patient.    Exercises      General Comments        Pertinent Vitals/Pain Pain Assessment: No/denies pain (Patient shakes head "no" when asked if he is hurting - x2)    Home Living                      Prior Function            PT Goals (current goals can now be found in the care plan section) Progress towards PT goals: Progressing toward goals    Frequency    Min 3X/week      PT Plan Current plan remains appropriate    Co-evaluation             End of Session   Activity Tolerance: Patient tolerated treatment well  Patient left: in bed;with call bell/phone within reach     Time: [id number] PT Time Calculation (min) (ACUTE ONLY): 14 min  Charges:  $Therapeutic Activity: 8-22 mins                    G Codes:      Vena AustriaSusan H Wateen Varon 10/02/2016, 6:18 PM Durenda HurtSusan H. Renaldo Fiddleravis, PT, [facility] Pager [phone]

## 2016-10-02 NOTE — Clinical Social Work Note (Signed)
Clinical Social Work Assessment  Patient Details  Name: Douglas [name] MRN: [id number] Date of Birth: 01/01/1941  Date of referral:  10/02/16               Reason for consult:  Facility Placement, Discharge Planning                Permission sought to share information with:  Facility Medical sales representativeContact Representative, Family Supports Permission granted to share information::  Yes, Verbal Permission Granted  Name::     Nance Jeanella Crazeierce, Earlene PlaterWallace Lape  Agency::  SNF's  Relationship::  Niece, Nephew Youth worker(HCPOA)  Contact Information:  Nance: [phone], [name]:: [phone]  Housing/Transportation Living arrangements for the past 2 months:  Single Family Home Source of Information:  Patient, Medical Team, Other (Comment Required) (Niece) Patient Interpreter Needed:  None Criminal Activity/Legal Involvement Pertinent to Current Situation/Hospitalization:  No - Comment as needed Significant Relationships:  Friend, Other Family Members Lives with:  Roommate Do you feel safe going back to the place where you live?  Yes Need for family participation in patient care:  Yes (Comment)  Care giving concerns:  PT recommending SNF once medically stable.   Social Worker assessment / plan:  Patient not fully oriented and unable to verbally communicate. CSW contacted patient's niece, Royetta Crochetance Pierce. She stated that patient's nephew is his HCPOA but he works in a mill and cannot talk on the phone during the day. CSW introduced role and explained that PT recommendations would be discussed. Patient's niece is hopeful for CIR. Discussed SNF process. She is agreeable to CSW faxing his information to SNF's in the area but states they would like to avoid a nursing home if patient would be safe in another setting. No further concerns. CSW encouraged Ms. Jeanella Crazeierce to contact CSW as needed. CSW will continue to follow patient and his family for support and facilitate discharge to SNF, if patient's family agreeable, once medically  stable.  Employment status:  Retired Health and safety inspectornsurance information:  Medicare PT Recommendations:  Skilled Nursing Facility Information / Referral to community resources:  Skilled Nursing Facility  Patient/Family's Response to care:  Patient not fully oriented. Patient's niece agreeable to CSW faxing information out to SNF's but would like to avoid a facility if at all possible. Patient's family supportive and involved in patient's care. Patient's niece appreciated social work intervention.  Patient/Family's Understanding of and Emotional Response to Diagnosis, Current Treatment, and Prognosis:  Patient not fully oriented. Patient's niece understands plan for discharge so far but would like to avoid a nursing home if at all possible. Patient's niece appears happy with hospital care.  Emotional Assessment Appearance:  Appears stated age Attitude/Demeanor/Rapport:  Unable to Assess Affect (typically observed):  Unable to Assess Orientation:  Oriented to Self Alcohol / Substance use:  Never Used Psych involvement (Current and /or in the community):  No (Comment)  Discharge Needs  Concerns to be addressed:  Care Coordination Readmission within the last 30 days:  No Current discharge risk:  Cognitively Impaired, Dependent with Mobility Barriers to Discharge:  Continued Medical Work up   Margarito LinerSarah C Clifton Safley, LCSW 10/02/2016, 12:47 PM

## 2016-10-02 NOTE — Progress Notes (Signed)
SLP Cancellation Note  Patient Details Name: Douglas CohoJames L Kurtzman MRN: [id number] DOB: 10/24/1940   Cancelled treatment:       Reason Eval/Treat Not Completed: Medical issues which prohibited therapy. Pt NPO pending possible procedure. Will continue to follow.   Maxcine Hamaiewonsky, Dannae Kato 10/02/2016, 1:56 PM  Maxcine HamLaura Paiewonsky, M.A. CCC-SLP [phone]

## 2016-10-02 NOTE — Progress Notes (Signed)
Nutrition Follow-up  DOCUMENTATION CODES:   Not applicable  INTERVENTION:    Diet advancement after procedure as able.  When diet advanced, add Mighty Shake II BID on meal trays, each supplement provides 480-500 kcals and 20-23 grams of protein  NUTRITION DIAGNOSIS:   Inadequate oral intake related to inability to eat as evidenced by NPO status.  Ongoing  GOAL:   Patient will meet greater than or equal to 90% of their needs  Unmet  MONITOR:   Diet advancement, PO intake, I & O's  ASSESSMENT:   75 y.o. male with PMH of HTN, [name], DM, [name] who presented on 12/12 with a stroke.  TEE was not done over the weekend due to comorbidities. Remains NPO for another potential attempt at TEE. Previously, diet had been advanced to dysphagia 1 with nectar thick liquids on 12/16 by SLP. Patient was consuming 40-90% of meals.   Diet Order:  Diet NPO time specified Except for: Sips with Meds  Skin:  Reviewed, no issues  Last BM:  12/17  Height:   Ht Readings from Last 1 Encounters:  09/27/16 5\' 10"  (1.778 m)    Weight:   Wt Readings from Last 1 Encounters:  09/27/16 159 lb 4.8 oz (72.3 kg)    Ideal Body Weight:  75.5 kg  BMI:  Body mass index is 22.86 kg/m.  Estimated Nutritional Needs:   Kcal:  1800-2000  Protein:  85-100 gm  Fluid:  1.8-2 L  EDUCATION NEEDS:   No education needs identified at this time  Joaquin CourtsKimberly Skiler Olden, RD, LDN, CNSC Pager [phone] After Hours Pager [phone]

## 2016-10-02 NOTE — Clinical Social Work Placement (Signed)
   CLINICAL SOCIAL WORK PLACEMENT  NOTE  Date:  10/02/2016  Patient Details  Name: Douglas [name] MRN: [id number] Date of Birth: 10/18/1940  Clinical Social Work is seeking post-discharge placement for this patient at the Skilled  Nursing Facility level of care (*CSW will initial, date and re-position this form in  chart as items are completed):  Yes   Patient/family provided with [facility] Clinical Social Work Department's list of facilities offering this level of care within the geographic area requested by the patient (or if unable, by the patient's family).  Yes   Patient/family informed of their freedom to choose among providers that offer the needed level of care, that participate in Medicare, Medicaid or managed care program needed by the patient, have an available bed and are willing to accept the patient.  Yes   Patient/family informed of [facility] ownership interest [facility] and [facility], as well as of the fact that they are under no obligation to receive care at these facilities.  PASRR submitted to EDS on 10/02/16     PASRR number received on 10/02/16     Existing PASRR number confirmed on       FL2 transmitted to all facilities in geographic area requested by pt/family on 10/02/16     FL2 transmitted to all facilities within larger geographic area on       Patient informed that his/her managed care company has contracts with or will negotiate with certain facilities, including the following:            Patient/family informed of bed offers received.  Patient chooses bed at       Physician recommends and patient chooses bed at      Patient to be transferred to   on  .  Patient to be transferred to facility by       Patient family notified on   of transfer.  Name of family member notified:        PHYSICIAN Please sign FL2     Additional Comment:    _______________________________________________ Margarito LinerSarah C Janavia Rottman, LCSW 10/02/2016,  12:53 PM

## 2016-10-02 NOTE — Progress Notes (Signed)
Occupational Therapy Treatment Patient Details Name: Osborn CohoJames L Thibeaux MRN: [id number] DOB: 11/15/1940 Today's Date: 10/02/2016    History of present illness Kathreen CornfieldJames L Aydeletteis a 75 y.o.malewith medical history significant of HTN, [name], DM, [name] presenting with a stroke. Pt with noted R sided weakness and expressive aphasia. 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   OT comments  Pt lethargic this date.  Pt sat EOBx 4 mins with max A, and unable to maintain arousal and fell asleep while seated.  Returned pt to supine. Likely will need SNF.   Follow Up Recommendations  SNF    Equipment Recommendations       Recommendations for Other Services      Precautions / Restrictions Precautions Precautions: Fall Precaution Comments: R sided inattention       Mobility Bed Mobility Overal bed mobility: Needs Assistance Bed Mobility: Supine to Sit;Sit to Supine     Supine to sit: Total assist Sit to supine: Total assist   General bed mobility comments: Pt able to assist minimally with moving LEs onto off of bed.  He opens eye briefly, but unable to sustain arousal, and falls asleep while sitting EOB   Transfers                 General transfer comment: unable to attempt due to pt falling asleep while sitting EOB     Balance Overall balance assessment: Needs assistance Sitting-balance support: Feet supported Sitting balance-Leahy Scale: Poor Sitting balance - Comments: Pt requires max A to sit EOB x 4 mins.  BP 114/84.  Pt opens eyes briefly, but falls asleep while sitting EOB.  He does not "yes" when asked if he is sleepy Postural control: Posterior lean;Right lateral lean                         ADL Overall ADL's : Needs assistance/impaired                                       General ADL Comments: total A for all ADLs as pt unable to maintain arousal       Vision                      Perception     Praxis      Cognition   Behavior During Therapy: Flat affect Overall Cognitive Status: Impaired/Different from baseline     Current Attention Level: Focused            General Comments: Pt very lethargic and unable to maintain arousal to participate    Extremity/Trunk Assessment               Exercises     Shoulder Instructions       General Comments      Pertinent Vitals/ Pain       Pain Assessment: Faces Faces Pain Scale: No hurt  Home Living                                          Prior Functioning/Environment              Frequency           Progress Toward Goals  OT Goals(current  goals can now be found in the care plan section)  Progress towards OT goals: Not progressing toward goals - comment (lethargy )     Plan Discharge plan needs to be updated    Co-evaluation                 End of Session Equipment Utilized During Treatment: Oxygen   Activity Tolerance Patient limited by lethargy   Patient Left in bed;with call bell/phone within reach;with bed alarm set   Nurse Communication Mobility status        Time: [id number] OT Time Calculation (min): 20 min  Charges: OT General Charges $OT Visit: 1 Procedure OT Treatments $Therapeutic Activity: 8-22 mins  Iriana Artley M 10/02/2016, 2:10 PM

## 2016-10-02 NOTE — Progress Notes (Signed)
Pharmacy Antibiotic Note  Douglas [name] is a 75 y.o. male admitted on 09/26/2016 with AMS and respiratory distress .  Pharmacy has been consulted to change Unasyn to Augmentin for aspiration pneumonia.  Day # 4 antibiotics. Noted plan for 7 days of therapy. Has been on Unasyn 3gm IV q8hrs since 09/29/16.  Last Unasyn dose ~6am today.  Renal function is within range for standard Augmentin dosing.  Plan:  Augmentin 875 mg PO BID.  Expecting 7 days of antibiotics, through 10/05/16.  Height: 5\' 10"  (177.8 cm) Weight: 159 lb 4.8 oz (72.3 kg) IBW/kg (Calculated) : 73  Temp (24hrs), Avg:98.3 F (36.8 C), Min:97.5 F (36.4 C), Max:98.7 F (37.1 C)   Recent Labs Lab 09/26/16 1834 09/28/16 0414 09/29/16 0606 09/30/16 0338 10/01/16 0652 10/02/16 0533  WBC  --  10.1 15.8* 11.5* 13.0* 12.5*  CREATININE 1.60* 1.41* 1.29* 1.33* 1.38*  --   LATICACIDVEN 1.28  --   --   --   --   --     Estimated Creatinine Clearance: 47.3 mL/min (by C-G formula based on SCr of 1.38 mg/dL (H)).    No Known Allergies  Antimicrobials this admission:  Unasyn 12/15 >> 12/18  Augmentin 12/18>>  Dose adjustments this admission:  n/a  Microbiology results: 12/13 Blood x 2 - ng x 4 days to date 12/13 MRSA PCR: neg  Thank you for allowing pharmacy to be a part of this patient's care.  Dennie Fettersgan, Douglas [name], [place] Pager: [phone] 10/02/2016 12:42 PM

## 2016-10-02 NOTE — Progress Notes (Signed)
Continuing to follow along for possible IP Rehab.  Pt. with event of apnea/respiratory arrest on 09/29/16.  Limited tolerance in therapies at this point.  Will continue to monitor for rehab plans once medically stable.    Weldon PickingSusan Laquesha Holcomb PT Inpatient Rehab Admissions Coordinator Cell [phone] Office [phone]

## 2016-10-02 NOTE — Progress Notes (Signed)
Inpatient Diabetes Program Recommendations  AACE/ADA: New Consensus Statement on Inpatient Glycemic Control (2015)  Target Ranges:  Prepandial:   less than 140 mg/dL      Peak postprandial:   less than 180 mg/dL (1-2 hours)      Critically ill patients:  140 - 180 mg/dL   Results for Osborn CohoYDELETTE, Sandro L (MRN [id number]) as of 10/02/2016 11:23  Ref. Range 10/01/2016 09:01 10/01/2016 11:44 10/01/2016 17:04 10/01/2016 21:04  Glucose-Capillary Latest Ref Range: 65 - 99 mg/dL 409190 (H) 811246 (H) 914259 (H) 210 (H)   Results for Osborn CohoYDELETTE, Terron L (MRN [id number]) as of 10/02/2016 11:23  Ref. Range 10/02/2016 09:21  Glucose-Capillary Latest Ref Range: 65 - 99 mg/dL 086169 (H)    Home DM Meds: Basaglar insulin 60 units daily       Tradjenta 5 mg daily  Current Insulin Orders: Novolog Sensitive Correction Scale/ SSI (0-9 units) TID AC       MD- Please consider the following in-hospital insulin adjustments:  1. Start Lantus 10 units daily (0.15 units/kg dosing based on weight of 72 kg) (per records, pt takes Basaglar 60 units daily at home)  2. Increase Novolog Correction Scale/ SSI to Moderate scale (0-15 units) TID AC + HS      --Will follow patient during hospitalization--  Ambrose FinlandJeannine Johnston Tajah Noguchi RN, MSN, CDE Diabetes Coordinator Inpatient Glycemic Control Team Team Pager: [phone] (8a-5p)

## 2016-10-03 DIAGNOSIS — R410 Disorientation, unspecified: Secondary | ICD-10-CM

## 2016-10-03 LAB — GLUCOSE, CAPILLARY
Glucose-Capillary: 164 mg/dL — ABNORMAL HIGH (ref 65–99)
Glucose-Capillary: 206 mg/dL — ABNORMAL HIGH (ref 65–99)
Glucose-Capillary: 219 mg/dL — ABNORMAL HIGH (ref 65–99)
Glucose-Capillary: 229 mg/dL — ABNORMAL HIGH (ref 65–99)

## 2016-10-03 LAB — CBC
HCT: 26.3 % — ABNORMAL LOW (ref 39.0–52.0)
Hemoglobin: 8 g/dL — ABNORMAL LOW (ref 13.0–17.0)
MCH: 23.7 pg — AB (ref 26.0–34.0)
MCHC: 30.4 g/dL (ref 30.0–36.0)
MCV: 78 fL (ref 78.0–100.0)
PLATELETS: 168 10*3/uL (ref 150–400)
RBC: 3.37 MIL/uL — ABNORMAL LOW (ref 4.22–5.81)
RDW: 20.5 % — AB (ref 11.5–15.5)
WBC: 10.6 10*3/uL — AB (ref 4.0–10.5)

## 2016-10-03 NOTE — Progress Notes (Signed)
Pt seen and he is agreeable for TEE tomorrow.  His hands are wrapped due to some confusion.  Will plan for 1500 tomorrow but will ask Dr. Rennis GoldenHilty to see in AM to see if he agrees.

## 2016-10-03 NOTE — Progress Notes (Signed)
Following along at a distance to assist in determining if CIR will potentially have a role in this patient's care and rehab.  I note plan for potential TEE and loop recorder.  Will continue to monitor.  Please call if questions.  Weldon PickingSusan Kelcy Laible PT Inpatient Rehab Admissions Coordinator Cell [phone] Office [phone]

## 2016-10-03 NOTE — Care Management Note (Signed)
Case Management Note  Patient Details  Name: Douglas [name] MRN: [id number] Date of Birth: 10/07/1941  Subjective/Objective:     Presents with CVA, has expressive Cyndie Mullahphasia, CSW following for SNF placement.               Action/Plan:   Expected Discharge Date:  10/02/16               Expected Discharge Plan:  Skilled Nursing Facility  In-House Referral:  Clinical Social Work  Discharge planning Services  CM Consult  Post Acute Care Choice:    Choice offered to:     DME Arranged:    DME Agency:     HH Arranged:    HH Agency:     Status of Service:  Completed, signed off  If discussed at [employer] Meetings, dates discussed:    Additional Comments:  Leone Havenaylor, Samad Thon Clinton, RN 10/03/2016, 2:11 PM

## 2016-10-03 NOTE — Progress Notes (Signed)
PROGRESS NOTE  Douglas [name]  AVW:[id number] DOB: Oct 08, 1941  DOA: 09/26/2016 PCP: Hollice Espy, MD   Brief Narrative:  75 year old male with PMH of HTN, [name], DM, CKD who presented to [facility] ED on 09/26/16 after his roommate found him in the floor of the living room and with speech difficulties. Last known well 09/26/16  and 12:30 PM. Workup confirmed bilateral embolic looking strokes and elevated troponin. Neurology and cardiology consulted.  Assessment & Plan:   Principal Problem:   CVA (cerebral vascular accident) (HCC) Active Problems:   Hyperlipidemia   Essential hypertension   DM (diabetes mellitus) (HCC)   CKD (chronic kidney disease)   Elevated troponin   Anemia   Acute embolic stroke (HCC)   Benign essential HTN   Tachycardia   Urinary retention   Acute delirium   1. Acute bilateral strokes: Left MCA, as well as scattered bilateral cerebral and cerebellar acute and subacute infarcts. Etiology: embolic of unknown source. Resultant receptive and expressive aphasia, facial asymmetry and right upper extremity weakness. MRI brain 09/26/16: Left MCA infarct. Multiple foci of acute ischemia scattered throughout both cerebral and cerebellar hemispheres. No hemorrhage or mass effect. MRA head: Occlusion of left MCA at the level of the bifurcation. 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 2-D echo: EF 50-55 percent, no source of embolus.. Carotid Dopplers: Not requested (had MRA neck). LDL 120. A1c: 7.2. Was on aspirin 81 MG daily prior to admission, now on 325 MG daily for secondary stroke prophylaxis. Therapies evaluation : Recommend CIR. Stroke team follow-up appreciated. TEE arranged for 12/15 to rule out embolic source but couldn't be done due to acute decline. Low index of suspicion for endocarditis unless he spikes a fever or positive blood cultures or TEE for vegetations and hence holding off on ID consultation at this time. EEG without reported seizures.  Remains on Depakote. Repeat head CT 12/15 showed left MCA infarct with increase in cytotoxic edema with localized mass effect but no midline shift and other findings as below. Continue treating conservatively as we are over the weekend and if he does not improve then may have to consider palliative care consultation. . Overall much improved compared to 12/15. As per neurology follow-up, plan for TEE and possible loop placement tomorow. 2. Acute encephalopathy: Multifactorial including acute worsening strokes, aspiration pneumonia and baseline mental status not known. Mental status has significantly improved compared to 48 hours ago. 3. Apnea/respiratory arrest 12/15/aspiration pneumonia/right perihilar opacity on chest x-ray: When patient was in endoscopy for TEE, he became somnolent, responsive only to painful stimuli, blood sugar 180, hypoxia and had periods of apnea. Subsequently came around and was more alert, hypoxia resolved. Improved. Started on IV Unasyn 12/15. Blood cultures 2 from 12/13: Negative to date. Recommend follow-up chest x-ray in 3-4 weeks. Monitor closely and if he declines, may need ICU transfer, intubation and CCM consultation. Consider transitioning to oral Augmentin on 12/18. 4.  Acute urinary retention: Urology was consulted and underwent Foley catheter placement 12/13. Reassess in a few days with voiding trial when he is close to discharge. Patient partially pulled out Foley catheter on 12/15 and had to be replaced. Hematuria secondary to trauma has almost resolved. Monitor. 5. Type II DM/IDDM with Recurrent hypoglycemia: Hold Onglyza. Likely due to nothing by mouth. Briefly treated with IV D5 infusion. Hypoglycemia resolved. Discontinued IV fluids. Diet resumed. Monitor closely and CBGs trending up. Eating well. Start SSI, sensitive. A1c 7.2. 6. Elevated troponin: No chest pain reported. Patient  found down at home.? Related to acute stroke and renal insufficiency. 2-D echo results  as below. Cardiology consultation appreciated and feels that elevated troponin is due to acute strokes. Outpatient stress test. 7. Essential hypertension: Allow permissive hypertension given recent acute stroke. Holding lisinopril. IV metoprolol changed to by mouth 12.5 twice a day. 8. Hyperlipidemia: LDL 120. Continue statins and Lopid. 9. Possible stage III chronic kidney disease: Baseline creatinine not known. Trend creatinine. Given acute urinary retention, check renal ultrasound to rule out obstruction. Renal ultrasound without hydronephrosis. Baseline creatinine probably in the 1.2-1.3 range. Stable. 10. Anemia: Baseline hemoglobin not known. Presented with hemoglobin of 10.2. May be chronic. Consider outpatient workup. No overt bleeding except traumatic hematuria. Follow CBC and consider transfusion if hemoglobin <7 g per DL. Hemoglobin has gradually declined to 8.1. 11. Hypothyroid: Resumed home dose of Synthroid. As per pharmacist's home medication reconciliation, it is not known as to which medications and when he was taking it prior to admission. 12. Dysphagia: Secondary to acute stroke. Initiated dysphagia 1 diet and nectar thickened liquids and as per RN, tolerating and eating well.     DVT prophylaxis: Lovenox discontinued due to hematuria and currently on SCDs. Code Status: DO NOT RESUSCITATE >this was discussed in detail by admitting M.D. with patient and family. Family Communication: None at bedside. Disposition Plan: Pending further evaluation, medical stability and therapies input. Possible CIR.   Consultants:   Neurology  Cardiology  Urology  Procedures:   Foley catheter placed by urology on 09/27/16  2-D echo 11/29/15: Study Conclusions  - Left ventricle: Distal septal hypokinesis The cavity size was   normal. Wall thickness was increased in a pattern of mild LVH.   Systolic function was normal. The estimated ejection fraction was   in the range of 50% to 55%.  Wall motion was normal; there were no   regional wall motion abnormalities. - Mitral valve: There was mild regurgitation. - Atrial septum: No defect or patent foramen ovale was identified.  Impressions:  - No cardiac source of emboli was indentified.  Antimicrobials:   None    Subjective: . As per RN, tolerating diet well, has been alert and follow simple instructions, no agitation, hematuria clearing up and no acute issues.  Objective:  Vitals:   10/03/16 0000 10/03/16 0315 10/03/16 0751 10/03/16 1213  BP: 127/60 (!) 148/58 (!) 147/63 114/63  Pulse: 89 94 (!) 101 87  Resp: (!) 21 20 (!) 21 (!) 22  Temp:  98 F (36.7 C) 98.5 F (36.9 C) 98 F (36.7 C)  TempSrc:  Axillary Axillary Axillary  SpO2: 100% 99% 100% 91%  Weight:      Height:        Intake/Output Summary (Last 24 hours) at 10/03/16 1337 Last data filed at 10/03/16 1217  Gross per 24 hour  Intake              240 ml  Output              750 ml  Net             -510 ml   Filed Weights   09/26/16 1819 09/27/16 0054  Weight: 72.6 kg (160 lb) 72.3 kg (159 lb 4.8 oz)    Examination:  General exam: Pleasant elderly male lying comfortably in bed.  Respiratory system: Slightly diminished breath sounds in the bases but otherwise clear to auscultation. Respiratory effort normal. Cardiovascular system: S1 & S2 heard, RRR. No JVD, murmurs, rubs,  gallops or clicks. No pedal edema. Telemetry: Mild sinus tachycardia. 6 beat NSVT on 12/16 p.m. Gastrointestinal system: Abdomen is nondistended, soft and nontender. No organomegaly or masses felt. Normal bowel sounds heard. Foley catheter with pink discoloration of urine (hematuria significantly better than 48 hours ago). Central nervous system: Alert and follow simple instructions. Facial asymmetry and aphasia present. Extremities: Left upper and bilateral lower extremities with seemingly normal power. Right upper extremity, grade 4 x 5 with pronator drift. Skin: Bruising  of dorsum of right hand which appears old. Psychiatry: Judgement and insight -impaired.     Data Reviewed: I have personally reviewed following labs and imaging studies  CBC:  Recent Labs Lab 09/28/16 0414 09/29/16 0606 09/29/16 1152 09/30/16 0338 10/01/16 0652 10/02/16 0533 10/03/16 0341  WBC 10.1 15.8*  --  11.5* 13.0* 12.5* 10.6*  NEUTROABS 7.8*  --  12.3*  --   --   --   --   HGB 9.0* 8.7*  --  8.5* 8.1* 7.9* 8.0*  HCT 29.1* 27.9*  --  28.3* 26.8* 25.9* 26.3*  MCV 76.2* 76.6*  --  77.7* 78.4 77.8* 78.0  PLT 216 181  --  172 181 184 168   Basic Metabolic Panel:  Recent Labs Lab 09/26/16 1800 09/26/16 1834 09/28/16 0414 09/29/16 0606 09/30/16 0338 10/01/16 0652  NA 138 138 135 137 139 142  K 3.7 3.8 4.0 3.9 4.1 3.7  CL 105 105 104 105 105 106  CO2 21*  --  21* 23 23 25   GLUCOSE 89 85 185* 175* 133* 213*  BUN 18 20 19 18 20  29*  CREATININE 1.62* 1.60* 1.41* 1.29* 1.33* 1.38*  CALCIUM 9.4  --  8.9 9.3 9.2 9.2   GFR: Estimated Creatinine Clearance: 47.3 mL/min (by C-G formula based on SCr of 1.38 mg/dL (H)). Liver Function Tests:  Recent Labs Lab 09/26/16 1800  AST 31  ALT 15*  ALKPHOS 135*  BILITOT 1.1  PROT 6.7  ALBUMIN 3.3*   No results for input(s): LIPASE, AMYLASE in the last 168 hours. No results for input(s): AMMONIA in the last 168 hours. Coagulation Profile: No results for input(s): INR, PROTIME in the last 168 hours. Cardiac Enzymes:  Recent Labs Lab 09/27/16 0317 09/27/16 0810 09/27/16 1607  TROPONINI 0.62* 0.64* 0.54*   BNP (last 3 results) No results for input(s): PROBNP in the last 8760 hours. HbA1C: No results for input(s): HGBA1C in the last 72 hours. CBG:  Recent Labs Lab 10/02/16 1339 10/02/16 1603 10/02/16 2109 10/03/16 0745 10/03/16 1214  GLUCAP 153* 143* 164* 164* 206*   Lipid Profile: No results for input(s): CHOL, HDL, LDLCALC, TRIG, CHOLHDL, LDLDIRECT in the last 72 hours. Thyroid Function Tests: No  results for input(s): TSH, T4TOTAL, FREET4, T3FREE, THYROIDAB in the last 72 hours. Anemia Panel: No results for input(s): VITAMINB12, FOLATE, FERRITIN, TIBC, IRON, RETICCTPCT in the last 72 hours.  Sepsis Labs:  Recent Labs Lab 09/26/16 1834 09/29/16 1152 09/30/16 0338 10/01/16 0652  PROCALCITON  --  0.27 0.26 0.61  LATICACIDVEN 1.28  --   --   --     Recent Results (from the past 240 hour(s))  Culture, blood (routine x 2)     Status: None   Collection Time: 09/27/16  3:20 AM  Result Value Ref Range Status   Specimen Description BLOOD RIGHT ANTECUBITAL  Final   Special Requests BOTTLES DRAWN AEROBIC AND ANAEROBIC 10CC  Final   Culture NO GROWTH 5 DAYS  Final   Report  Status 10/02/2016 FINAL  Final  Culture, blood (routine x 2)     Status: None   Collection Time: 09/27/16  3:28 AM  Result Value Ref Range Status   Specimen Description BLOOD RIGHT FOREARM  Final   Special Requests BOTTLES DRAWN AEROBIC ONLY 5CC  Final   Culture NO GROWTH 5 DAYS  Final   Report Status 10/02/2016 FINAL  Final  MRSA PCR Screening     Status: None   Collection Time: 09/27/16  3:40 AM  Result Value Ref Range Status   MRSA by PCR NEGATIVE NEGATIVE Final    Comment:        The GeneXpert MRSA Assay (FDA approved for NASAL specimens only), is one component of a comprehensive MRSA colonization surveillance program. It is not intended to diagnose MRSA infection nor to guide or monitor treatment for MRSA infections.          Radiology Studies: No results found.      Scheduled Meds: . amoxicillin-clavulanate  1 tablet Oral Q12H  . aspirin  325 mg Oral Daily  . atorvastatin  80 mg Oral q1800  . divalproex  500 mg Oral Daily  . enoxaparin (LOVENOX) injection  40 mg Subcutaneous Q24H  . insulin aspart  0-15 Units Subcutaneous TID WC  . insulin aspart  0-5 Units Subcutaneous QHS  . insulin glargine  10 Units Subcutaneous Daily  . levothyroxine  75 mcg Oral QAC breakfast  . LORazepam   1 mg Intravenous Once  . metoprolol tartrate  12.5 mg Oral BID   Continuous Infusions:    LOS: 7 days       Douglas Braddock, MD [facility] Pager [phone]  If 7PM-7AM, please contact night-coverage [email or web address] Password [email or web address] 10/03/2016, 1:37 PM

## 2016-10-03 NOTE — Progress Notes (Signed)
STROKE TEAM PROGRESS NOTE   SUBJECTIVE (INTERVAL HISTORY) Patient was seen and examined this morning. He is sleepy but arousable. He continues to have global aphasia. Pending TEE and loop recorder.  OBJECTIVE Temp:  [97.6 F (36.4 C)-98.5 F (36.9 C)] 97.6 F (36.4 C) (12/19 1600) Pulse Rate:  [87-101] 87 (12/19 1213) Cardiac Rhythm: Normal sinus rhythm (12/19 0751) Resp:  [16-22] 22 (12/19 1213) BP: (114-156)/(58-75) 114/63 (12/19 1213) SpO2:  [91 %-100 %] 91 % (12/19 1213)  CBC:   Recent Labs Lab 09/28/16 0414  09/29/16 1152  10/02/16 0533 10/03/16 0341  WBC 10.1  < >  --   < > 12.5* 10.6*  NEUTROABS 7.8*  --  12.3*  --   --   --   HGB 9.0*  < >  --   < > 7.9* 8.0*  HCT 29.1*  < >  --   < > 25.9* 26.3*  MCV 76.2*  < >  --   < > 77.8* 78.0  PLT 216  < >  --   < > 184 168  < > = values in this interval not displayed.  Basic Metabolic Panel:   Recent Labs Lab 09/30/16 0338 10/01/16 0652  NA 139 142  K 4.1 3.7  CL 105 106  CO2 23 25  GLUCOSE 133* 213*  BUN 20 29*  CREATININE 1.33* 1.38*  CALCIUM 9.2 9.2    Lipid Panel:     Component Value Date/Time   CHOL 173 09/27/2016 0317   TRIG 102 09/27/2016 0317   HDL 33 (L) 09/27/2016 0317   CHOLHDL 5.2 09/27/2016 0317   VLDL 20 09/27/2016 0317   LDLCALC 120 (H) 09/27/2016 0317   HgbA1c:  Lab Results  Component Value Date   HGBA1C 7.2 (H) 09/27/2016   Urine Drug Screen:     Component Value Date/Time   LABOPIA NONE DETECTED 09/30/2016 0248   COCAINSCRNUR NONE DETECTED 09/30/2016 0248   LABBENZ NONE DETECTED 09/30/2016 0248   AMPHETMU NONE DETECTED 09/30/2016 0248   THCU NONE DETECTED 09/30/2016 0248   LABBARB NONE DETECTED 09/30/2016 0248      IMAGING I have personally reviewed the radiological images below and agree with the radiology interpretations.  Dg Chest 2 View 09/26/2016  Ill-defined opacity seen in right lung concerning for possible pneumonia or edema.   Ct Head Wo  Contrast 09/26/2016 1. Suspect edema within the left insular cortex, anterior temporal lobe and left inferior frontal lobe. MRI is recommended for further evaluation. Possible mildly dense distal left MCA artery.  2. No hemorrhage.  3. Atrophy   Ct Head Wo Contrast 09/29/2016 Evolution of left middle cerebral artery distribution infarct in the left posterior frontal and superior left temporal regions with increase in cytotoxic edema in this area. There is localized mass effect from the cytotoxic edema but no midline shift. There is a small infarct in the right cerebellum medially and posteriorly showing evolution from recent study. No hemorrhage. Note that there is hyperdensity in the distal left middle cerebral artery, likely thrombus in this region. There is mild atrophy with mild periventricular small vessel disease.  Mr Laqueta Jean Wo Contrast Mr Angiogram Head Wo Contrast Mr Angiogram Neck W Or Wo Contrast 09/27/2016 1. 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  Multiple other punctate foci of acute ischemia scattered throughout both cerebral and cerebellar hemispheres suggest a central embolic process. No hemorrhage or mass effect.  2. Occlusion of the left MCA at  the level of the bifurcation.  3. Multiple foci of contrast enhancement within the cerebellum and right frontal lobe, with the largest focus measuring up to 2 cm. Given the other findings, these findings may be secondary to subacute ischemia. However, follow-up imaging (MRI brain with and without contrast) is recommended in 4-6 weeks to exclude the possibility of a neoplastic process, primarily metastatic disease.  4. 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 Otherwise normal MRA of the neck.   2-D echocardiogram  - Left ventricle: Distal septal hypokinesis The cavity size was normal. Wall thickness was increased in a pattern of mild LVH. Systolic function was  normal. The estimated ejection fraction was in the range of 50% to 55%. Wall motion was normal; there were no regional wall motion abnormalities. - Mitral valve: There was mild regurgitation. - Atrial septum: No defect or patent foramen ovale was identified. Impressions:   No cardiac source of emboli was indentified.  TEE pending   PHYSICAL EXAM  Temp:  [97.6 F (36.4 C)-98.5 F (36.9 C)] 97.6 F (36.4 C) (12/19 1600) Pulse Rate:  [87-101] 87 (12/19 1213) Resp:  [16-22] 22 (12/19 1213) BP: (114-156)/(58-75) 114/63 (12/19 1213) SpO2:  [91 %-100 %] 91 % (12/19 1213)  General - Well nourished, well developed, in no apparent distress, sleepy initially.  Ophthalmologic - Fundi not visualized due to noncooperation  Cardiovascular - Regular rate and rhythm.  Neuro - initially sleepy, however able to open eyes on voice. More awake alert, however continued to have global aphasia, not to follow commands except able to open/close eyes on command. Not able to name or repeat. Not blinking to visual threat on the right, PERRL, left gaze preference, able to cross midline, right facial droop, tongue in midline in mouth, RUE 4+/5, RLE 3/5, LUE and LLE 5-/5, DTR 1+, and right Babinski positive. Sensation, pronation and gait not tested.    ASSESSMENT/PLAN Douglas [name] is a 75 y.o. male with history of HTN, [name], DM [name] presenting with global aphasia and right-sided weakness. He did not receive IV t-PA due to delay in arrival.   Stroke:  Embolic shower largest at left  MCA, but also include scattered bilateral cerebral and cerebellar, embolic secondary to unknown source.   MRI left MCA insular frontal infarct with scattered bilateral cerebral and cerebellar infarcts. Enhancing subacute left cerebellar and right frontal infarcts as well.  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   TEE pending  Recommend he is 30  day cardiac event monitoring by cardiology EP   LDL 120  HgbA1c 7.2  Lovenox 40 mg daily for VTE prophylaxis. Diet NPO time specified DIET - DYS 1 Room service appropriate? Yes; Fluid consistency: Nectar Thick  aspirin 81 mg daily prior to admission, now on aspirin 325 mg daily.   Ongoing aggressive stroke risk factor management  Therapy recommendations: pending  Disposition:  pending  Hypertension  Stable  Permissive hypertension (OK if < 220/120) but gradually normalize in 5-7 days  Long-term BP goal normotensive  Hyperlipidemia  Home meds:  lipitor 80 mg daily  Resumed statin   LDL 120, goal < 70  Continue Lipitor 80 at discharge  Diabetes type II  HgbA1c 7.2 , goal < 7.0  Uncontrolled  SSI  On Lantus  DM nurse following  Other Stroke Risk Factors  Advanced age  Former Cigarette smoker  ETOH use  Other Active Problems  Agitation and confusion - on depakote 500 mg daily.  Urinary retention with incomplete foreskin retraction, urology placed catheter  Hospital day # 7  Marvel PlanJindong Jrue Jarriel, MD PhD Stroke Neurology 10/03/2016 5:53 PM    To contact Stroke Continuity provider, please refer to [email or web address]. After hours, contact General Neurology

## 2016-10-03 NOTE — Consult Note (Signed)
ELECTROPHYSIOLOGY CONSULT NOTE  Patient ID: Douglas [name] MRN: [id number], DOB/AGE: Jan 11, 1941   Admit date: 09/26/2016 Date of Consult: 10/03/2016  Primary Physician: Hollice Espy, MD  Reason for Consultation: Cryptogenic stroke; recommendations regarding monitoring for afib  History of Present Illness Douglas [name] was admitted on 09/26/2016 with acute CVA. he has been monitored on telemetry which has demonstrated no arrhythmias. No cause has been identified. Inpatient stroke work-up is ongoing.  He has significant agitation requiring restraints as well as aphasia.  He is making very slow progress.  EP has been asked to evaluate for placement of an implantable loop recorder to monitor for atrial fibrillation.  Past Medical History Past Medical History:  Diagnosis Date  . CKD (chronic kidney disease)   . DM (diabetes mellitus) (HCC)   . Hyperlipidemia   . Hypertension     Past Surgical History History reviewed. No pertinent surgical history.  Allergies/Intolerances No Known Allergies Inpatient Medications . amoxicillin-clavulanate  1 tablet Oral Q12H  . aspirin  325 mg Oral Daily  . atorvastatin  80 mg Oral q1800  . divalproex  500 mg Oral Daily  . enoxaparin (LOVENOX) injection  40 mg Subcutaneous Q24H  . insulin aspart  0-15 Units Subcutaneous TID WC  . insulin aspart  0-5 Units Subcutaneous QHS  . insulin glargine  10 Units Subcutaneous Daily  . levothyroxine  75 mcg Oral QAC breakfast  . LORazepam  1 mg Intravenous Once  . metoprolol tartrate  12.5 mg Oral BID    Social History Social History   Social History  . Marital status: Single    Spouse name: N/A  . Number of children: N/A  . Years of education: N/A   Occupational History  . retired    Social History Main Topics  . Smoking status: Former Smoker    Quit date: 06/30/1982  . Smokeless tobacco: Never Used     Comment: maybe not?  Marland Kitchen Alcohol use Yes     Comment: occasional  . Drug use: No    . Sexual activity: Not on file   Other Topics Concern  . Not on file   Social History Narrative  . No narrative on file    Review of Systems General: No chills, fever, night sweats or weight changes  Cardiovascular:  No chest pain, dyspnea on exertion, edema, orthopnea, palpitations, paroxysmal nocturnal dyspnea Dermatological: No rash, lesions or masses Respiratory: No cough, dyspnea Urologic: No hematuria, dysuria Abdominal: No nausea, vomiting, diarrhea, bright red blood per rectum, melena, or hematemesis Neurologic: No visual changes, weakness, changes in mental status All other systems reviewed and are otherwise negative except as noted above.  Physical Exam Blood pressure 114/63, pulse 87, temperature 98 F (36.7 C), temperature source Axillary, resp. rate (!) 22, height 5\' 10"  (1.778 m), weight 159 lb 4.8 oz (72.3 kg), SpO2 91 %.  General: ill appearing 75 y.o. male in no acute distress.  Agitated, wearing mittons HEENT: Normocephalic, atraumatic. EOMs intact. Sclera nonicteric. Oropharynx clear.  Neck: Supple without bruits. No JVD. Lungs: clear Heart: RRR.  Abdomen: Soft, non-tender, non-distended. BS present x 4 quadrants. No hepatosplenomegaly.  Extremities: wearing mittons Psych: agitated Neuro: aphasic   Labs Lab Results  Component Value Date   WBC 10.6 (H) 10/03/2016   HGB 8.0 (L) 10/03/2016   HCT 26.3 (L) 10/03/2016   MCV 78.0 10/03/2016   PLT 168 10/03/2016    Recent Labs Lab 09/26/16 1800  10/01/16 0652  NA 138  < >  142  K 3.7  < > 3.7  CL 105  < > 106  CO2 21*  < > 25  BUN 18  < > 29*  CREATININE 1.62*  < > 1.38*  CALCIUM 9.4  < > 9.2  PROT 6.7  --   --   BILITOT 1.1  --   --   ALKPHOS 135*  --   --   ALT 15*  --   --   AST 31  --   --   GLUCOSE 89  < > 213*  < > = values in this interval not displayed. No results for input(s): INR in the last 72 hours.  Radiology/Studies Dg Chest 2 View  Result Date: 09/26/2016 CLINICAL DATA:   Altered mental status. EXAM: CHEST  2 VIEW COMPARISON:  None. FINDINGS: Mild cardiomegaly is noted. No pneumothorax or pleural effusion is noted. Left lung is clear. Ill-defined opacity is seen in the right lung concerning for possible pneumonia or edema. Bony thorax is unremarkable. IMPRESSION: Ill-defined opacity seen in right lung concerning for possible pneumonia or edema. Electronically Signed   By: Lupita Raider, M.D.   On: 09/26/2016 19:08   Ct Head Wo Contrast  Result Date: 09/29/2016 CLINICAL DATA:  Reason CVA.  Intermittent apnea EXAM: CT HEAD WITHOUT CONTRAST TECHNIQUE: Contiguous axial images were obtained from the base of the skull through the vertex without intravenous contrast. COMPARISON:  Brain MRI September 26, 2016 FINDINGS: Brain: There is mild diffuse atrophy. There has been evolution of infarct in the left middle cerebral artery distribution with cytotoxic edema in the posterior left fall and much of the left superior temporal lobe regions. There is no appreciable hemorrhage or midline shift. No mass seen. There is no subdural or epidural fluid collections. There is also an infarct in the posteromedial right cerebellum which appears larger than on recent MR. There is no appreciable mass effect from this fairly small but present infarct. Several tiny lacunar type infarcts in the posterior circulation are not evident by CT. Elsewhere, there is patchy small vessel disease in the centra semiovale bilaterally. No new infarct is evident compared to 3 days prior. Vascular: Increased attenuation is noted in the periphery of the left middle cerebral artery consistent with infarct in the left middle cerebral artery distribution, likely a small focus of thrombus in the distal left middle cerebral artery. No hyperdense vessel. There are foci of calcification in each carotid siphon. Skull: The bony calvarium appears intact. Sinuses/Orbits: Paranasal sinuses are clear. Orbits appear symmetric  bilaterally. Other: There is opacification in several inferior mastoid air cells on the left. Mastoids elsewhere clear. 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 IMPRESSION: Evolution of left middle cerebral artery distribution infarct in the left posterior frontal and superior left temporal regions with increase in cytotoxic edema in this area. There is localized mass effect from the cytotoxic edema but no midline shift. There is a small infarct in the right cerebellum medially and posteriorly showing evolution from recent study. No hemorrhage. Note that there is hyperdensity in the distal left middle cerebral artery, likely thrombus in this region. There is mild atrophy with mild periventricular small vessel disease. There is inferior left mastoid air cell disease. There is advanced arthropathy in both temporomandibular joints. Electronically Signed   By: Bretta Bang III M.D.   On: 09/29/2016 10:02   Ct Head Wo Contrast  Result Date: 09/26/2016 CLINICAL DATA:  Found down on floor today.  Aphasia EXAM: CT HEAD WITHOUT CONTRAST TECHNIQUE: Contiguous axial images were obtained from the base of the skull through the vertex without intravenous contrast. COMPARISON:  None. FINDINGS: Brain: There is no acute hemorrhage. No focal mass lesion. Suspected loss of the gray-white matter differentiation involving the left insula, inferior frontal, and anterior temporal lobes. Moderate atrophy. Ventricles slightly enlarged but felt secondary to atrophy. Vascular: Carotid artery calcifications. Questionable mildly dense distal left MCA. Skull: Mastoid air cells are clear. No fracture. Prominent mandibular condyles with degenerative changes. Sinuses/Orbits: No acute fluid levels in the sinuses. Mild mucosal thickening in the ethmoid sinuses. No acute orbital abnormality. Other: None IMPRESSION: 1. Suspect edema within the left insular cortex,  anterior temporal lobe and left inferior frontal lobe. MRI is recommended for further evaluation. Possible mildly dense distal left MCA artery. 2. No hemorrhage. 3. Atrophy Electronically Signed   By: Jasmine PangKim  Fujinaga M.D.   On: 09/26/2016 21:04   Mr Angiogram Head Wo Contrast  Result Date: 09/27/2016 CLINICAL DATA:  Right-sided paralysis and aphasia. EXAM: MRI HEAD WITHOUT AND WITH CONTRAST MRA HEAD WITHOUT CONTRAST MRA NECK WITHOUT AND WITH CONTRAST TECHNIQUE: Multiplanar, multiecho pulse sequences of the brain and surrounding structures were obtained without and with intravenous contrast. Angiographic images of the Circle of Willis were obtained using MRA technique without intravenous contrast. Angiographic images of the neck were obtained using MRA technique without and with intravenous contrast. Carotid stenosis measurements (when applicable) are obtained utilizing NASCET criteria, using the distal internal carotid diameter as the denominator. CONTRAST:  15 mL MultiHance IV COMPARISON:  Head CT 09/26/2016 FINDINGS: MRI HEAD FINDINGS Brain: The midline structures are normal. There is extensive diffusion restriction in the left insular cortex and frontal operculum. There are additional small foci of diffusion restriction scattered throughout both cerebral hemispheres and both sides of the cerebellum. There is no midline shift or significant mass effect. No evidence of acute hemorrhage. There is multifocal hyperintense T2-weighted signal within the periventricular white matter, most often seen in the setting of chronic microvascular ischemia. No hydrocephalus or extra-axial fluid collection. No lobar predominant atrophy pattern. There are areas of contrast enhancement within the cerebellum. Within the peripheral aspect of the left hemisphere, in the area of peripheral contrast enhancement measures 1.9 x 1.1 cm. A smaller focus near the cerebellar midline measures 4 mm. There is an additional focus of contrast  enhancement along the right precentral gyrus. Vascular: Major intracranial arterial and venous sinus flow voids are preserved. Area of hemosiderin deposition in the left cerebellar hemisphere. There is focal susceptibility in the location of the left MCA bifurcation, which corresponds to the occlusion demonstrated on time-of-flight images. Skull and upper cervical spine: The visualized skull base, calvarium, upper cervical spine and extracranial soft tissues are normal. Sinuses/Orbits: No fluid levels or advanced mucosal thickening. No mastoid effusion. Normal orbits. MRA HEAD FINDINGS Intracranial internal carotid arteries: Normal. Anterior cerebral arteries: Normal. Middle cerebral arteries: There is occlusion of the left middle cerebral artery at the site of the bifurcation. There is minimal flow related enhancement seen within the distal left MCA distribution. The right MCA is normal. Posterior communicating arteries: Present bilaterally. Posterior cerebral arteries: Normal. Basilar artery: Normal. Vertebral arteries: Codominant. Normal. Superior cerebellar arteries: Normal. Anterior inferior cerebellar arteries: Normal. Posterior inferior cerebellar arteries: Normal. MRA NECK FINDINGS There is a normal 3 vessel aortic arch branch pattern. The bilateral subclavian arteries are normal. There is approximately 30% stenosis at the proximal left internal  carotid artery. No right carotid stenosis. The cervical courses of both vertebral arteries are normal. IMPRESSION: 1. 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 Multiple other punctate foci of acute ischemia scattered throughout both cerebral and cerebellar hemispheres suggest a central embolic process. No hemorrhage or mass effect. 2. 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 3. Multiple foci of contrast enhancement within the cerebellum and right frontal lobe, with the largest focus measuring up to 2 cm. Given the  other findings, these findings may be secondary to subacute ischemia. However, follow-up imaging (MRI brain with and without contrast) is recommended in 4-6 weeks to exclude the possibility of a neoplastic process, primarily metastatic disease. 4. 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 Otherwise normal MRA of the neck. Critical Value/emergent results were called by telephone at the time of interpretation on 09/27/2016 at 12:08 am to [facility] [place] who verbally acknowledged these results. Electronically Signed   By: Deatra Robinson M.D.   On: 09/27/2016 00:18   Mr Angiogram Neck W Or Wo Contrast  Result Date: 09/27/2016 CLINICAL DATA:  Right-sided paralysis and aphasia. EXAM: MRI HEAD WITHOUT AND WITH CONTRAST MRA HEAD WITHOUT CONTRAST MRA NECK WITHOUT AND WITH CONTRAST TECHNIQUE: Multiplanar, multiecho pulse sequences of the brain and surrounding structures were obtained without and with intravenous contrast. Angiographic images of the Circle of Willis were obtained using MRA technique without intravenous contrast. Angiographic images of the neck were obtained using MRA technique without and with intravenous contrast. Carotid stenosis measurements (when applicable) are obtained utilizing NASCET criteria, using the distal internal carotid diameter as the denominator. CONTRAST:  15 mL MultiHance IV COMPARISON:  Head CT 09/26/2016 FINDINGS: MRI HEAD FINDINGS Brain: The midline structures are normal. There is extensive diffusion restriction in the left insular cortex and frontal operculum. There are additional small foci of diffusion restriction scattered throughout both cerebral hemispheres and both sides of the cerebellum. There is no midline shift or significant mass effect. No evidence of acute hemorrhage. There is multifocal hyperintense T2-weighted signal within the periventricular white matter, most often seen in the setting of chronic microvascular ischemia. No hydrocephalus  or extra-axial fluid collection. No lobar predominant atrophy pattern. There are areas of contrast enhancement within the cerebellum. Within the peripheral aspect of the left hemisphere, in the area of peripheral contrast enhancement measures 1.9 x 1.1 cm. A smaller focus near the cerebellar midline measures 4 mm. There is an additional focus of contrast enhancement along the right precentral gyrus. Vascular: Major intracranial arterial and venous sinus flow voids are preserved. Area of hemosiderin deposition in the left cerebellar hemisphere. There is focal susceptibility in the location of the left MCA bifurcation, which corresponds to the occlusion demonstrated on time-of-flight images. Skull and upper cervical spine: The visualized skull base, calvarium, upper cervical spine and extracranial soft tissues are normal. Sinuses/Orbits: No fluid levels or advanced mucosal thickening. No mastoid effusion. Normal orbits. MRA HEAD FINDINGS Intracranial internal carotid arteries: Normal. Anterior cerebral arteries: Normal. Middle cerebral arteries: There is occlusion of the left middle cerebral artery at the site of the bifurcation. There is minimal flow related enhancement seen within the distal left MCA distribution. The right MCA is normal. Posterior communicating arteries: Present bilaterally. Posterior cerebral arteries: Normal. Basilar artery: Normal. Vertebral arteries: Codominant. Normal. Superior cerebellar arteries: Normal. Anterior inferior cerebellar arteries: Normal. Posterior inferior cerebellar arteries: Normal. MRA NECK FINDINGS There is a normal 3 vessel aortic arch branch  pattern. The bilateral subclavian arteries are normal. There is approximately 30% stenosis at the proximal left internal carotid artery. No right carotid stenosis. The cervical courses of both vertebral arteries are normal. IMPRESSION: 1. 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 Multiple other punctate  foci of acute ischemia scattered throughout both cerebral and cerebellar hemispheres suggest a central embolic process. No hemorrhage or mass effect. 2. 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 3. Multiple foci of contrast enhancement within the cerebellum and right frontal lobe, with the largest focus measuring up to 2 cm. Given the other findings, these findings may be secondary to subacute ischemia. However, follow-up imaging (MRI brain with and without contrast) is recommended in 4-6 weeks to exclude the possibility of a neoplastic process, primarily metastatic disease. 4. 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 Otherwise normal MRA of the neck. Critical Value/emergent results were called by telephone at the time of interpretation on 09/27/2016 at 12:08 am to [facility] who verbally acknowledged these results. Electronically Signed   By: Deatra RobinsonKevin  Herman M.D.   On: 09/27/2016 00:18   Mr Laqueta JeanBrain W WJWo Contrast  Result Date: 09/27/2016 CLINICAL DATA:  Right-sided paralysis and aphasia. EXAM: MRI HEAD WITHOUT AND WITH CONTRAST MRA HEAD WITHOUT CONTRAST MRA NECK WITHOUT AND WITH CONTRAST TECHNIQUE: Multiplanar, multiecho pulse sequences of the brain and surrounding structures were obtained without and with intravenous contrast. Angiographic images of the Circle of Willis were obtained using MRA technique without intravenous contrast. Angiographic images of the neck were obtained using MRA technique without and with intravenous contrast. Carotid stenosis measurements (when applicable) are obtained utilizing NASCET criteria, using the distal internal carotid diameter as the denominator. CONTRAST:  15 mL MultiHance IV COMPARISON:  Head CT 09/26/2016 FINDINGS: MRI HEAD FINDINGS Brain: The midline structures are normal. There is extensive diffusion restriction in the left insular cortex and frontal operculum. There are additional small foci of diffusion restriction  scattered throughout both cerebral hemispheres and both sides of the cerebellum. There is no midline shift or significant mass effect. No evidence of acute hemorrhage. There is multifocal hyperintense T2-weighted signal within the periventricular white matter, most often seen in the setting of chronic microvascular ischemia. No hydrocephalus or extra-axial fluid collection. No lobar predominant atrophy pattern. There are areas of contrast enhancement within the cerebellum. Within the peripheral aspect of the left hemisphere, in the area of peripheral contrast enhancement measures 1.9 x 1.1 cm. A smaller focus near the cerebellar midline measures 4 mm. There is an additional focus of contrast enhancement along the right precentral gyrus. Vascular: Major intracranial arterial and venous sinus flow voids are preserved. Area of hemosiderin deposition in the left cerebellar hemisphere. There is focal susceptibility in the location of the left MCA bifurcation, which corresponds to the occlusion demonstrated on time-of-flight images. Skull and upper cervical spine: The visualized skull base, calvarium, upper cervical spine and extracranial soft tissues are normal. Sinuses/Orbits: No fluid levels or advanced mucosal thickening. No mastoid effusion. Normal orbits. MRA HEAD FINDINGS Intracranial internal carotid arteries: Normal. Anterior cerebral arteries: Normal. Middle cerebral arteries: There is occlusion of the left middle cerebral artery at the site of the bifurcation. There is minimal flow related enhancement seen within the distal left MCA distribution. The right MCA is normal. Posterior communicating arteries: Present bilaterally. Posterior cerebral arteries: Normal. Basilar artery: Normal. Vertebral arteries: Codominant. Normal. Superior cerebellar arteries: Normal. Anterior inferior cerebellar arteries: Normal. Posterior inferior  cerebellar arteries: Normal. MRA NECK FINDINGS There is a normal 3 vessel aortic arch  branch pattern. The bilateral subclavian arteries are normal. There is approximately 30% stenosis at the proximal left internal carotid artery. No right carotid stenosis. The cervical courses of both vertebral arteries are normal. IMPRESSION: 1. 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 Multiple other punctate foci of acute ischemia scattered throughout both cerebral and cerebellar hemispheres suggest a central embolic process. No hemorrhage or mass effect. 2. 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 3. Multiple foci of contrast enhancement within the cerebellum and right frontal lobe, with the largest focus measuring up to 2 cm. Given the other findings, these findings may be secondary to subacute ischemia. However, follow-up imaging (MRI brain with and without contrast) is recommended in 4-6 weeks to exclude the possibility of a neoplastic process, primarily metastatic disease. 4. 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 Otherwise normal MRA of the neck. Critical Value/emergent results were called by telephone at the time of interpretation on 09/27/2016 at 12:08 am to [facility] [place] who verbally acknowledged these results. Electronically Signed   By: Deatra Robinson M.D.   On: 09/27/2016 00:18   US Renal  Result Date: 09/27/2016 CLINICAL DATA:  Urinary retention, hypertension, diabetes mellitus, chronic kidney disease EXAM: RENAL / URINARY TRACT ULTRASOUND COMPLETE COMPARISON:  Ultrasound abdomen 10/08/2015 FINDINGS: Right Kidney: Length: 5.1 cm. Cortical thinning. Upper normal cortical echogenicity. Small probable cyst at upper pole RIGHT kidney 1.6 x 1.8 x 2.0 cm, measured 2.7 x 1.8 x 2.0 cm on 10/08/2015. No additional mass, hydronephrosis or shadowing calcification. Left Kidney: Length: By 0.7 cm. Diffuse cortical thinning. Normal cortical echogenicity. No mass, hydronephrosis or shadowing calcification. Bladder: Decompressed  by Foley catheter, unable to evaluate. IMPRESSION: Interval decrease in size of a small cyst at the upper pole RIGHT kidney. Age-related cortical atrophy without evidence of additional renal mass or hydronephrosis. Electronically Signed   By: Ulyses Southward M.D.   On: 09/27/2016 16:55   Dg Chest Port 1 View  Result Date: 09/29/2016 CLINICAL DATA:  Transesophageal echocardiogram, decreased responsiveness, apnea, desaturation, code stroke. EXAM: PORTABLE CHEST 1 VIEW COMPARISON:  09/26/2016. FINDINGS: Trachea is midline. Heart size stable. Mild patchy airspace opacification in the right perihilar region. Lungs are low in volume. Minimal bibasilar atelectasis. No pleural fluid. Left apical pleural thickening. IMPRESSION: Minimal right perihilar airspace opacification may be due to pneumonia. Her Followup PA and lateral chest X-ray is recommended in 3-4 weeks following trial of antibiotic therapy to ensure resolution and exclude underlying malignancy. Electronically Signed   By: Leanna Battles M.D.   On: 09/29/2016 12:26    12-lead ECG  Sinus rhythm Telemetry sinus rhythm, no afib   Assessment and Plan 1. Cryptogenic stroke The patient is very ill.  He is agitated and requires restrains.  He has aphasia.  He is not a candidate for implantable loop recorder home monitoring at this time.  I would advise 30 day monitor at time of discharge if he does not have afib on telemetry while here.  A more conservative approach is best at this time. If he makes full recovery, I would be happy to see him in the future in the office to consider ILR at that time.  Electrophysiology team to see as needed while here. Please call with questions.  Douglas [name] 10/03/2016, 4:06 PM

## 2016-10-03 NOTE — Progress Notes (Signed)
Speech Language Pathology Treatment: Dysphagia;Cognitive-Linquistic  Patient Details Name: Douglas [name] MRN: [id number] DOB: 06/10/1941 Today's Date: 10/03/2016 Time: [id number] SLP Time Calculation (min) (ACUTE ONLY): 19 min  Assessment / Plan / Recommendation Clinical Impression  NT was assisting pt with lunch meal upon SLP arrival. Pt needed Min cues for oral clearance of pureed textures and for smaller sips with nectar thick liquids given via straw. Pt had one instance of wet vocal quality followed by delayed cough, but he does not cough on command.   His speech remains nonsensical, comprised mostly of jargon. He appears to smile and nod appropriately with conversation, but when asked simple biographical and environmental yes/no questions he responds with a head nod "yes" to 90% of questioning. Mod visual and tactile cues were given for following one-step commands throughout session. Will continue to follow.   HPI HPI: 75 y.o. male with medical history significant of HTN, [name], DM, [name] presenting with a stroke. Pt with noted R sided weakness and expressive aphasia. 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      SLP Plan  Continue with current plan of care     Recommendations  Diet recommendations: Dysphagia 1 (puree);Nectar-thick liquid Liquids provided via: Cup;Straw Medication Administration: Crushed with puree Supervision: Full supervision/cueing for compensatory strategies;Staff to assist with self feeding Compensations: Slow rate;Small sips/bites;Clear throat intermittently                Oral Care Recommendations: Oral care BID Follow up Recommendations: Inpatient Rehab Plan: Continue with current plan of care       GO                Maxcine Hamaiewonsky, Syrina Wake 10/03/2016, 2:28 PM  Maxcine HamLaura Paiewonsky, M.A. CCC-SLP [phone]

## 2016-10-04 ENCOUNTER — Encounter (HOSPITAL_COMMUNITY)
Admission: EM | Disposition: A | Discharge status: Rehabilitation Facility | Payer: Self-pay | Source: Home / Self Care | Attending: Internal Medicine

## 2016-10-04 ENCOUNTER — Inpatient Hospital Stay (HOSPITAL_COMMUNITY): Payer: Medicare Other

## 2016-10-04 LAB — BASIC METABOLIC PANEL
Anion gap: 13 (ref 5–15)
BUN: 27 mg/dL — AB (ref 6–20)
CHLORIDE: 109 mmol/L (ref 101–111)
CO2: 21 mmol/L — ABNORMAL LOW (ref 22–32)
CREATININE: 1.27 mg/dL — AB (ref 0.61–1.24)
Calcium: 9.5 mg/dL (ref 8.9–10.3)
GFR calc Af Amer: >60 mL/min (ref >60)
GFR calc non Af Amer: 54 mL/min — ABNORMAL LOW (ref >60)
GLUCOSE: 181 mg/dL — AB (ref 65–99)
POTASSIUM: 3.7 mmol/L (ref 3.5–5.1)
Sodium: 143 mmol/L (ref 135–145)

## 2016-10-04 LAB — GLUCOSE, CAPILLARY
GLUCOSE-CAPILLARY: 254 mg/dL — AB (ref 65–99)
GLUCOSE-CAPILLARY: 241 mg/dL — AB (ref 65–99)
Glucose-Capillary: 161 mg/dL — ABNORMAL HIGH (ref 65–99)
Glucose-Capillary: 293 mg/dL — ABNORMAL HIGH (ref 65–99)

## 2016-10-04 LAB — CBC
HCT: 27.9 % — ABNORMAL LOW (ref 39.0–52.0)
HEMOGLOBIN: 8.5 g/dL — AB (ref 13.0–17.0)
MCH: 23.7 pg — AB (ref 26.0–34.0)
MCHC: 30.5 g/dL (ref 30.0–36.0)
MCV: 77.7 fL — AB (ref 78.0–100.0)
PLATELETS: 157 10*3/uL (ref 150–400)
RBC: 3.59 MIL/uL — AB (ref 4.22–5.81)
RDW: 20.3 % — ABNORMAL HIGH (ref 11.5–15.5)
WBC: 11.8 10*3/uL — ABNORMAL HIGH (ref 4.0–10.5)

## 2016-10-04 SURGERY — ECHOCARDIOGRAM, TRANSESOPHAGEAL
Anesthesia: Moderate Sedation

## 2016-10-04 MED ORDER — CLOPIDOGREL BISULFATE 75 MG PO TABS
75.0000 mg | ORAL_TABLET | Freq: Every day | ORAL | Status: DC
  Administered 2016-10-04 – 2016-10-05 (×2): 75 mg via ORAL
  Filled 2016-10-04 (×2): qty 1

## 2016-10-04 NOTE — Progress Notes (Signed)
Physical Therapy Treatment Patient Details Name: Douglas [name] MRN: [id number] DOB: 04/25/1941 Today's Date: 10/04/2016    History of Present Illness Douglas AlimentJames L Aydeletteis a 75 y.o.malewith medical history significant of HTN, [name], DM, [name] presenting with a stroke. Pt with noted R sided weakness and expressive aphasia. 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    PT Comments    Pt very agreeable and able to participate in standing in stedy.  Pt with strong R lateral lean and posterior lean.  Pt's HR increased to a max of 132 during mobility.  Feel pt will need continued therapies at D/C.  Will continue to follow.    Follow Up Recommendations  SNF     Equipment Recommendations  None recommended by PT    Recommendations for Other Services       Precautions / Restrictions Precautions Precautions: Fall Precaution Comments: R sided inattention Restrictions Weight Bearing Restrictions: No    Mobility  Bed Mobility               General bed mobility comments: pt in recliner  Transfers Overall transfer level: Needs assistance   Transfers: Sit to/from Stand Sit to Stand: Mod assist;+2 physical assistance         General transfer comment: Utilized stedy for coming to stand x2.  Facilitation for more midline posture and balance.  A to position head in neutral spine, which also helped balance.  pt does use L UE to A with use of stedy, but moves LEs around on stedy.    Ambulation/Gait                 Stairs            Wheelchair Mobility    Modified Rankin (Stroke Patients Only) Modified Rankin (Stroke Patients Only) Pre-Morbid Rankin Score: No symptoms Modified Rankin: Severe disability     Balance Overall balance assessment: Needs assistance Sitting-balance support: Single extremity supported;Feet supported Sitting balance-Leahy Scale: Poor   Postural control: Posterior lean;Right lateral lean Standing  balance support: During functional activity Standing balance-Leahy Scale: Poor                      Cognition Arousal/Alertness: Awake/alert Behavior During Therapy: Flat affect Overall Cognitive Status: Difficult to assess Area of Impairment: Attention;Following commands   Current Attention Level: Sustained   Following Commands: Follows one step commands with increased time;Follows one step commands inconsistently            Exercises      General Comments        Pertinent Vitals/Pain Pain Assessment: No/denies pain    Home Living                      Prior Function            PT Goals (current goals can now be found in the care plan section) Acute Rehab PT Goals Patient Stated Goal: didn't state PT Goal Formulation: With patient/family Time For Goal Achievement: 10/11/16 Potential to Achieve Goals: Good Progress towards PT goals: Progressing toward goals    Frequency    Min 3X/week      PT Plan Current plan remains appropriate    Co-evaluation             End of Session Equipment Utilized During Treatment: Gait belt Activity Tolerance: Patient tolerated treatment well Patient left: in chair;with call bell/phone within reach  Time: [id number] PT Time Calculation (min) (ACUTE ONLY): 24 min  Charges:  $Therapeutic Activity: 23-37 mins                    G CodesSunny Schlein, [place]  [phone] 10/04/2016, 12:22 PM

## 2016-10-04 NOTE — Clinical Social Work Note (Signed)
CSW called patient's niece to discuss SNF placement. Patient's niece concerned that there is no discharge date yet. CSW explained that a plan needs to be put in place prior to discharge so there is no confusion on the day he is cleared. Patient's niece expressed understanding. CSW emailed list of bed offers. [place] friends [place] in [place] so CSW faxed out to them as well. CSW will update patient's niece [place] [facility] [place].  Charlynn CourtSarah Gerlene Glassburn, CSW [phone]

## 2016-10-04 NOTE — Progress Notes (Signed)
CSW received call from MD saying patient possibly able to DC tomorrow.  CSW updated patient niece of possible DC tomorrow.  Niece is very overwhelmed by trying to find a SNF by tomorrow but understands we cannot keep patient here once medically stable.  CSW will continue to follow  Burna SisJenna H. Silva Aamodt, LCSW Clinical Social Worker [phone]

## 2016-10-04 NOTE — Progress Notes (Addendum)
STROKE TEAM PROGRESS NOTE   SUBJECTIVE (INTERVAL HISTORY) Patient was seen and examined this morning. He is awake, alert and sitting in the chair. He continues to have global aphasia, but able to follow simple command of eye closing and opening. Patient is not agitated or requiring safety mittens. Weakness has improved, but continues to have facial droop. EP cardiology has suggested 30 day event monitor, and TEE cancelled as pt still not ready.   OBJECTIVE Temp:  [97.6 F (36.4 C)-98.7 F (37.1 C)] 98.7 F (37.1 C) (12/20 0800) Pulse Rate:  [87-118] 106 (12/20 0800) Cardiac Rhythm: Normal sinus rhythm (12/20 0800) Resp:  [19-26] 19 (12/20 0800) BP: (114-142)/(61-77) 130/66 (12/20 0800) SpO2:  [91 %-95 %] 95 % (12/20 0800)  CBC:   Recent Labs Lab 09/28/16 0414  09/29/16 1152  10/03/16 0341 10/04/16 0623  WBC 10.1  < >  --   < > 10.6* 11.8*  NEUTROABS 7.8*  --  12.3*  --   --   --   HGB 9.0*  < >  --   < > 8.0* 8.5*  HCT 29.1*  < >  --   < > 26.3* 27.9*  MCV 76.2*  < >  --   < > 78.0 77.7*  PLT 216  < >  --   < > 168 157  < > = values in this interval not displayed.  Basic Metabolic Panel:   Recent Labs Lab 10/01/16 0652 10/04/16 0623  NA 142 143  K 3.7 3.7  CL 106 109  CO2 25 21*  GLUCOSE 213* 181*  BUN 29* 27*  CREATININE 1.38* 1.27*  CALCIUM 9.2 9.5    Lipid Panel:     Component Value Date/Time   CHOL 173 09/27/2016 0317   TRIG 102 09/27/2016 0317   HDL 33 (L) 09/27/2016 0317   CHOLHDL 5.2 09/27/2016 0317   VLDL 20 09/27/2016 0317   LDLCALC 120 (H) 09/27/2016 0317   HgbA1c:  Lab Results  Component Value Date   HGBA1C 7.2 (H) 09/27/2016   Urine Drug Screen:     Component Value Date/Time   LABOPIA NONE DETECTED 09/30/2016 0248   COCAINSCRNUR NONE DETECTED 09/30/2016 0248   LABBENZ NONE DETECTED 09/30/2016 0248   AMPHETMU NONE DETECTED 09/30/2016 0248   THCU NONE DETECTED 09/30/2016 0248   LABBARB NONE DETECTED 09/30/2016 0248      IMAGING I  have personally reviewed the radiological images below and agree with the radiology interpretations.  Dg Chest 2 View 09/26/2016  Ill-defined opacity seen in right lung concerning for possible pneumonia or edema.   Ct Head Wo Contrast 09/26/2016 1. Suspect edema within the left insular cortex, anterior temporal lobe and left inferior frontal lobe. MRI is recommended for further evaluation. Possible mildly dense distal left MCA artery.  2. No hemorrhage.  3. Atrophy   Ct Head Wo Contrast 09/29/2016 Evolution of left middle cerebral artery distribution infarct in the left posterior frontal and superior left temporal regions with increase in cytotoxic edema in this area. There is localized mass effect from the cytotoxic edema but no midline shift. There is a small infarct in the right cerebellum medially and posteriorly showing evolution from recent study. No hemorrhage. Note that there is hyperdensity in the distal left middle cerebral artery, likely thrombus in this region. There is mild atrophy with mild periventricular small vessel disease.  Mr Laqueta Jean Wo Contrast Mr Angiogram Head Wo Contrast Mr Angiogram Neck W Or Wo Contrast 09/27/2016 1.  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  Multiple other punctate foci of acute ischemia scattered throughout both cerebral and cerebellar hemispheres suggest a central embolic process. No hemorrhage or mass effect.  2. 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  3. Multiple foci of contrast enhancement within the cerebellum and right frontal lobe, with the largest focus measuring up to 2 cm. Given the other findings, these findings may be secondary to subacute ischemia. However, follow-up imaging (MRI brain with and without contrast) is recommended in 4-6 weeks to exclude the possibility of a neoplastic process, primarily metastatic disease.  4. Approximately 30% stenosis of the proximal left  internal carotid artery. Otherwise normal MRA of the neck.   2-D echocardiogram  - Left ventricle: Distal septal hypokinesis The cavity size was normal. Wall thickness was increased in a pattern of mild LVH. Systolic function was normal. The estimated ejection fraction was in the range of 50% to 55%. Wall motion was normal; there were no regional wall motion abnormalities. - Mitral valve: There was mild regurgitation. - Atrial septum: No defect or patent foramen ovale was identified. Impressions:   No cardiac source of emboli was indentified.   PHYSICAL EXAM  Temp:  [97.6 F (36.4 C)-98.7 F (37.1 C)] 98.7 F (37.1 C) (12/20 0800) Pulse Rate:  [87-118] 106 (12/20 0800) Resp:  [19-26] 19 (12/20 0800) BP: (114-142)/(61-77) 130/66 (12/20 0800) SpO2:  [91 %-95 %] 95 % (12/20 0800)  General - Well nourished, well developed, in no apparent distress, awake and alert.   Ophthalmologic - Fundi not visualized due to noncooperation  Cardiovascular - Regular rate and rhythm.  Neuro -More awake alert, however continued to have global aphasia, able to open and close eyes on command but not able to follow other simple commands. Not able to name or repeat but perseveration. Not blinking to visual threat on the right, PERRL, left gaze preference, able to cross midline, right facial droop, tongue in midline in mouth, RUE 4/5, RLE 4/5, LUE and LLE 5-/5, DTR 1+, and right Babinski positive. Sensation, pronation and gait not tested.  ASSESSMENT/PLAN Douglas [name] is a 75 y.o. male with history of HTN, [name], DM [name] presenting with global aphasia and right-sided weakness. He did not receive IV t-PA due to delay in arrival.   Stroke:  Embolic shower largest at left  MCA, but also include scattered bilateral cerebral and cerebellar, embolic secondary to unknown source.   MRI left MCA insular frontal infarct with scattered bilateral cerebral and cerebellar infarcts. Enhancing subacute left  cerebellar and right frontal infarcts as well.  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   Recommend he is 30 day cardiac event monitoring by cardiology EP   LDL 120  HgbA1c 7.2  Lovenox 40 mg daily for VTE prophylaxis. Diet NPO time specified  aspirin 81 mg daily prior to admission, now on aspirin 325 mg daily. Due to intracranial stenosis, recommend DAPT for 3 months and then plavix alone.  Ongoing aggressive stroke risk factor management  Therapy recommendations: SNF  Disposition:  pending  Hypertension  Stable  Long-term BP goal normotensive  Hyperlipidemia  Home meds:  lipitor 80 mg daily  Resumed statin   LDL 120, goal < 70  Continue Lipitor 80 at discharge  Diabetes type II  HgbA1c 7.2 , goal < 7.0  Uncontrolled  SSI  On Lantus  DM nurse following  Other Stroke Risk Factors  Advanced age  Former Cigarette smoker  ETOH use  Other Active Problems  Agitation and confusion - on depakote 500 mg daily.  Urinary retention with incomplete foreskin retraction, urology placed catheter  Hospital day # 8   Neurology will sign off. Please call with questions. [facility]. Thanks for the consult.   Marvel PlanJindong Lauralynn Loeb, MD PhD Stroke Neurology 10/04/2016 9:47 AM    To contact Stroke Continuity provider, please refer to [email or web address]. After hours, contact General Neurology

## 2016-10-04 NOTE — Progress Notes (Signed)
PROGRESS NOTE  Douglas [name]  UEA:[id number] DOB: [birth year]  DOA: 09/26/2016 PCP: Hollice Espy, MD   Brief Narrative:  75 year old male with PMH of HTN, [name], DM, CKD who presented to [facility] ED on 09/26/16 after his roommate found him in the floor of the living room and with speech difficulties. Last known well 09/26/16  and 12:30 PM. Workup confirmed bilateral embolic looking strokes and elevated troponin. Neurology and cardiology consulted.  Assessment & Plan:   Principal Problem:   CVA (cerebral vascular accident) (HCC) Active Problems:   Hyperlipidemia   Essential hypertension   DM (diabetes mellitus) (HCC)   CKD (chronic kidney disease)   Elevated troponin   Anemia   Acute embolic stroke (HCC)   Benign essential HTN   Tachycardia   Urinary retention   Acute delirium  Acute bilateral strokes: -  Left MCA, as well as scattered bilateral cerebral and cerebellar acute and subacute infarcts. Etiology: embolic of unknown source. Resultant receptive and expressive aphasia, facial asymmetry and right upper extremity weakness. MRI brain 09/26/16: Left MCA infarct. Multiple foci of acute ischemia scattered throughout both cerebral and cerebellar hemispheres. No hemorrhage or mass effect. MRA head: Occlusion of left MCA at the level of the bifurcation. 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 2-D echo: EF 50-55 percent, no source of embolus.. Carotid Dopplers: Not requested (had MRA neck). LDL 120. A1c: 7.2.  - Was on aspirin 81 MG daily prior to admission, now on 325 MG daily for secondary stroke prophylaxis. Due to intracranial stenosis, recommend DAPT for 3 months and then plavix alone. - Overall much improved compared to 12/15. As per neurology follow-up, plan for TEE and possible loop placement tomorow. -  Cardiology EP, patient is not a candidate for group record, the plan for 30 days cardiac event monitor.  Acute encephalopathy: -  Multifactorial including  acute worsening strokes, aspiration pneumonia and baseline mental status not known. Mental status improving  Apnea/respiratory arrest 12/15/aspiration pneumonia/right perihilar opacity on chest x-ray:  - When patient was in endoscopy for TEE, he became somnolent, responsive only to painful stimuli, blood sugar 180, hypoxia and had periods of apnea. Subsequently came around and was more alert, hypoxia resolved. Improved. Started on IV Unasyn 12/15. Blood cultures 2 from 12/13: Negative to date. Recommend follow-up chest x-ray in 3-4 weeks. Monitor closely and if he declines, may need ICU transfer, intubation and CCM consultation. Will transition to oral Augmentin.  Acute urinary retention:  - Urology was consulted and underwent Foley catheter placement 12/13. Reassess in a few days with voiding trial when he is close to discharge. Patient partially pulled out Foley catheter on 12/15 and had to be replaced. Hematuria secondary to trauma has almost resolved. Monitor.  Type II DM/IDDM with Recurrent hypoglycemia:  - Hold Onglyza. Likely due to nothing by mouth. Briefly treated with IV D5 infusion. Hypoglycemia resolved. Discontinued IV fluids. Diet resumed. Monitor closely and CBGs trending up. Eating well. Start SSI, sensitive. A1c 7.2.  Elevated troponin:  - No chest pain reported. Patient found down at home.? Related to acute stroke and renal insufficiency. 2-D echo results as below. Cardiology consultation appreciated and feels that elevated troponin is due to acute strokes. Outpatient stress test.  Essential hypertension: -  Allow permissive hypertension given recent acute stroke. Holding lisinopril. IV metoprolol changed to by mouth 12.5 twice a day.  Hyperlipidemia:  - LDL 120. Continue statins and Lopid.  Possible stage III chronic kidney disease: -  Baseline  creatinine not known. Trend creatinine. Given acute urinary retention, check renal ultrasound to rule out obstruction. Renal  ultrasound without hydronephrosis. Baseline creatinine probably in the 1.2-1.3 range. Stable.  Anemia:  - Baseline hemoglobin not known. Presented with hemoglobin of 10.2. May be chronic. Consider outpatient workup. No overt bleeding except traumatic hematuria. Follow CBC and consider transfusion if hemoglobin <7 g per DL. Hemoglobin has gradually declined to 8.1.  Hypothyroid: -  Resumed home dose of Synthroid. As per pharmacist's home medication reconciliation, it is not known as to which medications and when he was taking it prior to admission.  Dysphagia:  - Secondary to acute stroke. Initiated dysphagia 1 diet and nectar thickened liquids and as per RN, tolerating and eating well.     DVT prophylaxis: Lovenox. Code Status: DO NOT RESUSCITATE >this was discussed in detail by admitting M.D. with patient and family. Family Communication: None at bedside. Disposition Plan: SNF  Consultants:   Neurology  Cardiology  Urology  Procedures:   Foley catheter placed by urology on 09/27/16  2-D echo 11/29/15: Study Conclusions  - Left ventricle: Distal septal hypokinesis The cavity size was   normal. Wall thickness was increased in a pattern of mild LVH.   Systolic function was normal. The estimated ejection fraction was   in the range of 50% to 55%. Wall motion was normal; there were no   regional wall motion abnormalities. - Mitral valve: There was mild regurgitation. - Atrial septum: No defect or patent foramen ovale was identified.  Impressions:  - No cardiac source of emboli was indentified.  Antimicrobials:   None    Subjective: . As per RN, tolerating diet well, has been alert and follow simple instructions, no agitation, hematuria clearing up and no acute issues.  Objective:  Vitals:   10/03/16 2335 10/04/16 0325 10/04/16 0800 10/04/16 1221  BP: (!) 142/77 (!) 142/64 130/66 119/65  Pulse: (!) 106 (!) 107 (!) 106 92  Resp: (!) 26 (!) 25 19 20   Temp: 98 F  (36.7 C) 98.1 F (36.7 C) 98.7 F (37.1 C) 97.9 F (36.6 C)  TempSrc: Axillary Axillary Axillary Oral  SpO2: 95% 93% 95% 96%  Weight:      Height:        Intake/Output Summary (Last 24 hours) at 10/04/16 1435 Last data filed at 10/04/16 1223  Gross per 24 hour  Intake              480 ml  Output             1000 ml  Net             -520 ml   Filed Weights   09/26/16 1819 09/27/16 0054  Weight: 72.6 kg (160 lb) 72.3 kg (159 lb 4.8 oz)    Examination:  General exam: Pleasant elderly male lying comfortably in bed.  Respiratory system: Slightly diminished breath sounds in the bases but otherwise clear to auscultation. Respiratory effort normal. Cardiovascular system: S1 & S2 heard, RRR. No JVD, murmurs, rubs, gallops or clicks. No pedal edema. Telemetry: Mild sinus tachycardia. 6 beat NSVT on 12/16 p.m. Gastrointestinal system: Abdomen is nondistended, soft and nontender. No organomegaly or masses felt. Normal bowel sounds heard. Foley catheter with pink discoloration of urine (hematuria significantly better than 48 hours ago). Central nervous system: Alert and follow simple instructions. Facial asymmetry and aphasia present. Extremities: Left upper and bilateral lower extremities with seemingly normal power. Right upper extremity, grade 4 x 5 with  pronator drift. Skin: Bruising of dorsum of right hand which appears old. Psychiatry: Judgement and insight -impaired.     Data Reviewed: I have personally reviewed following labs and imaging studies  CBC:  Recent Labs Lab 09/28/16 0414  09/29/16 1152 09/30/16 0338 10/01/16 0652 10/02/16 0533 10/03/16 0341 10/04/16 0623  WBC 10.1  < >  --  11.5* 13.0* 12.5* 10.6* 11.8*  NEUTROABS 7.8*  --  12.3*  --   --   --   --   --   HGB 9.0*  < >  --  8.5* 8.1* 7.9* 8.0* 8.5*  HCT 29.1*  < >  --  28.3* 26.8* 25.9* 26.3* 27.9*  MCV 76.2*  < >  --  77.7* 78.4 77.8* 78.0 77.7*  PLT 216  < >  --  172 181 184 168 157  < > = values in this  interval not displayed. Basic Metabolic Panel:  Recent Labs Lab 09/28/16 0414 09/29/16 0606 09/30/16 0338 10/01/16 0652 10/04/16 0623  NA 135 137 139 142 143  K 4.0 3.9 4.1 3.7 3.7  CL 104 105 105 106 109  CO2 21* 23 23 25  21*  GLUCOSE 185* 175* 133* 213* 181*  BUN 19 18 20  29* 27*  CREATININE 1.41* 1.29* 1.33* 1.38* 1.27*  CALCIUM 8.9 9.3 9.2 9.2 9.5   GFR: Estimated Creatinine Clearance: 51.4 mL/min (by C-G formula based on SCr of 1.27 mg/dL (H)). Liver Function Tests: No results for input(s): AST, ALT, ALKPHOS, BILITOT, PROT, ALBUMIN in the last 168 hours. No results for input(s): LIPASE, AMYLASE in the last 168 hours. No results for input(s): AMMONIA in the last 168 hours. Coagulation Profile: No results for input(s): INR, PROTIME in the last 168 hours. Cardiac Enzymes:  Recent Labs Lab 09/27/16 1607  TROPONINI 0.54*   BNP (last 3 results) No results for input(s): PROBNP in the last 8760 hours. HbA1C: No results for input(s): HGBA1C in the last 72 hours. CBG:  Recent Labs Lab 10/03/16 1214 10/03/16 1700 10/03/16 2215 10/04/16 0758 10/04/16 1219  GLUCAP 206* 219* 229* 161* 254*   Lipid Profile: No results for input(s): CHOL, HDL, LDLCALC, TRIG, CHOLHDL, LDLDIRECT in the last 72 hours. Thyroid Function Tests: No results for input(s): TSH, T4TOTAL, FREET4, T3FREE, THYROIDAB in the last 72 hours. Anemia Panel: No results for input(s): VITAMINB12, FOLATE, FERRITIN, TIBC, IRON, RETICCTPCT in the last 72 hours.  Sepsis Labs:  Recent Labs Lab 09/29/16 1152 09/30/16 0338 10/01/16 0652  PROCALCITON 0.27 0.26 0.61    Recent Results (from the past 240 hour(s))  Culture, blood (routine x 2)     Status: None   Collection Time: 09/27/16  3:20 AM  Result Value Ref Range Status   Specimen Description BLOOD RIGHT ANTECUBITAL  Final   Special Requests BOTTLES DRAWN AEROBIC AND ANAEROBIC 10CC  Final   Culture NO GROWTH 5 DAYS  Final   Report Status 10/02/2016  FINAL  Final  Culture, blood (routine x 2)     Status: None   Collection Time: 09/27/16  3:28 AM  Result Value Ref Range Status   Specimen Description BLOOD RIGHT FOREARM  Final   Special Requests BOTTLES DRAWN AEROBIC ONLY 5CC  Final   Culture NO GROWTH 5 DAYS  Final   Report Status 10/02/2016 FINAL  Final  MRSA PCR Screening     Status: None   Collection Time: 09/27/16  3:40 AM  Result Value Ref Range Status   MRSA by PCR NEGATIVE NEGATIVE Final  Comment:        The GeneXpert MRSA Assay (FDA approved for NASAL specimens only), is one component of a comprehensive MRSA colonization surveillance program. It is not intended to diagnose MRSA infection nor to guide or monitor treatment for MRSA infections.          Radiology Studies: No results found.      Scheduled Meds: . amoxicillin-clavulanate  1 tablet Oral Q12H  . aspirin  325 mg Oral Daily  . atorvastatin  80 mg Oral q1800  . clopidogrel  75 mg Oral Daily  . divalproex  500 mg Oral Daily  . enoxaparin (LOVENOX) injection  40 mg Subcutaneous Q24H  . insulin aspart  0-15 Units Subcutaneous TID WC  . insulin aspart  0-5 Units Subcutaneous QHS  . insulin glargine  10 Units Subcutaneous Daily  . levothyroxine  75 mcg Oral QAC breakfast  . LORazepam  1 mg Intravenous Once  . metoprolol tartrate  12.5 mg Oral BID   Continuous Infusions:    LOS: 8 days       Eriana Suliman, MD [facility] Pager [phone]  If 7PM-7AM, please contact night-coverage [email or web address] Password [email or web address] 10/04/2016, 2:35 PM

## 2016-10-04 NOTE — Progress Notes (Signed)
D/W Dr. Roda ShuttersXu - I saw and evaluated Mr. Douglas [name] for TEE today. He had a stroke just prior to a TEE scheduled last week. This was significant and he has expressive aphasia and altered LOC. He could not follow a conversation and drifted off to sleep. I'm concerned about the ability to sedate him and maintain an adequate airway with moderate sedation. I do not see clinical features concerning for endocarditis. EP does not feel he is an implanted loop recorder candidate and are recommending a 30 day monitor. We were in agreement with DAPT and monitoring for now and I will cancel the TEE today. Dr. Roda ShuttersXu will inform the family.  Chrystie NoseKenneth C. Hilty, MD, [facility] Luther[facility] Health CenterFACC Attending Cardiologist [facility]

## 2016-10-04 NOTE — Progress Notes (Signed)
Inpatient Rehabilitation  I had a lengthy phone conversation with Kingsley PlanNancy Pierce, pt's niece,  regarding planning for pt's next venue of care and rehabilitation.  I explained that pt currently not tolerating enough activity with therapies to be able to participate in the IP Rehab program but that we are continuing to follow for any progression.  Additionally, Harriett Sineancy says there is no 24 hour caregiver support.  At this time, we anticipate pt. will need SNF however we will continue to follow along for any progress which would allow an IP rehab admission.  Please call if questions.  Weldon PickingSusan Jafar Poffenberger PT Inpatient Rehab Admissions Coordinator Cell [phone] Office [phone]

## 2016-10-05 ENCOUNTER — Inpatient Hospital Stay (HOSPITAL_COMMUNITY)
Admission: RE | Admit: 2016-10-05 | Discharge: 2016-10-31 | DRG: 091 | Disposition: A | Discharge status: Skilled Nursing Facility | Payer: Medicare Other | Source: Intra-hospital | Attending: Physical Medicine & Rehabilitation | Admitting: Physical Medicine & Rehabilitation

## 2016-10-05 DIAGNOSIS — D500 Iron deficiency anemia secondary to blood loss (chronic): Secondary | ICD-10-CM | POA: Diagnosis not present

## 2016-10-05 DIAGNOSIS — D696 Thrombocytopenia, unspecified: Secondary | ICD-10-CM | POA: Diagnosis not present

## 2016-10-05 DIAGNOSIS — E1122 Type 2 diabetes mellitus with diabetic chronic kidney disease: Secondary | ICD-10-CM | POA: Diagnosis present

## 2016-10-05 DIAGNOSIS — F329 Major depressive disorder, single episode, unspecified: Secondary | ICD-10-CM | POA: Diagnosis present

## 2016-10-05 DIAGNOSIS — R451 Restlessness and agitation: Secondary | ICD-10-CM | POA: Diagnosis present

## 2016-10-05 DIAGNOSIS — I69320 Aphasia following cerebral infarction: Secondary | ICD-10-CM

## 2016-10-05 DIAGNOSIS — Z794 Long term (current) use of insulin: Secondary | ICD-10-CM

## 2016-10-05 DIAGNOSIS — D72829 Elevated white blood cell count, unspecified: Secondary | ICD-10-CM

## 2016-10-05 DIAGNOSIS — E039 Hypothyroidism, unspecified: Secondary | ICD-10-CM | POA: Diagnosis present

## 2016-10-05 DIAGNOSIS — D62 Acute posthemorrhagic anemia: Secondary | ICD-10-CM | POA: Diagnosis present

## 2016-10-05 DIAGNOSIS — E870 Hyperosmolality and hypernatremia: Secondary | ICD-10-CM | POA: Diagnosis present

## 2016-10-05 DIAGNOSIS — D61818 Other pancytopenia: Secondary | ICD-10-CM | POA: Diagnosis present

## 2016-10-05 DIAGNOSIS — I69351 Hemiplegia and hemiparesis following cerebral infarction affecting right dominant side: Secondary | ICD-10-CM

## 2016-10-05 DIAGNOSIS — N390 Urinary tract infection, site not specified: Secondary | ICD-10-CM | POA: Diagnosis present

## 2016-10-05 DIAGNOSIS — Z79899 Other long term (current) drug therapy: Secondary | ICD-10-CM

## 2016-10-05 DIAGNOSIS — Z8249 Family history of ischemic heart disease and other diseases of the circulatory system: Secondary | ICD-10-CM

## 2016-10-05 DIAGNOSIS — I129 Hypertensive chronic kidney disease with stage 1 through stage 4 chronic kidney disease, or unspecified chronic kidney disease: Secondary | ICD-10-CM | POA: Diagnosis present

## 2016-10-05 DIAGNOSIS — I69391 Dysphagia following cerebral infarction: Secondary | ICD-10-CM | POA: Diagnosis not present

## 2016-10-05 DIAGNOSIS — E8809 Other disorders of plasma-protein metabolism, not elsewhere classified: Secondary | ICD-10-CM

## 2016-10-05 DIAGNOSIS — R7309 Other abnormal glucose: Secondary | ICD-10-CM

## 2016-10-05 DIAGNOSIS — A419 Sepsis, unspecified organism: Secondary | ICD-10-CM

## 2016-10-05 DIAGNOSIS — E1142 Type 2 diabetes mellitus with diabetic polyneuropathy: Secondary | ICD-10-CM | POA: Diagnosis present

## 2016-10-05 DIAGNOSIS — R4701 Aphasia: Secondary | ICD-10-CM

## 2016-10-05 DIAGNOSIS — N183 Chronic kidney disease, stage 3 unspecified: Secondary | ICD-10-CM

## 2016-10-05 DIAGNOSIS — R531 Weakness: Secondary | ICD-10-CM | POA: Diagnosis not present

## 2016-10-05 DIAGNOSIS — K922 Gastrointestinal hemorrhage, unspecified: Secondary | ICD-10-CM | POA: Diagnosis present

## 2016-10-05 DIAGNOSIS — D649 Anemia, unspecified: Secondary | ICD-10-CM

## 2016-10-05 DIAGNOSIS — N179 Acute kidney failure, unspecified: Secondary | ICD-10-CM | POA: Diagnosis present

## 2016-10-05 DIAGNOSIS — R1310 Dysphagia, unspecified: Secondary | ICD-10-CM | POA: Diagnosis present

## 2016-10-05 DIAGNOSIS — J189 Pneumonia, unspecified organism: Secondary | ICD-10-CM | POA: Diagnosis not present

## 2016-10-05 DIAGNOSIS — R278 Other lack of coordination: Secondary | ICD-10-CM | POA: Diagnosis not present

## 2016-10-05 DIAGNOSIS — N471 Phimosis: Secondary | ICD-10-CM | POA: Diagnosis present

## 2016-10-05 DIAGNOSIS — I63512 Cerebral infarction due to unspecified occlusion or stenosis of left middle cerebral artery: Secondary | ICD-10-CM | POA: Diagnosis not present

## 2016-10-05 DIAGNOSIS — I69390 Apraxia following cerebral infarction: Secondary | ICD-10-CM

## 2016-10-05 DIAGNOSIS — Z823 Family history of stroke: Secondary | ICD-10-CM

## 2016-10-05 DIAGNOSIS — Z87891 Personal history of nicotine dependence: Secondary | ICD-10-CM

## 2016-10-05 DIAGNOSIS — K921 Melena: Secondary | ICD-10-CM | POA: Diagnosis not present

## 2016-10-05 DIAGNOSIS — R4702 Dysphasia: Secondary | ICD-10-CM | POA: Diagnosis not present

## 2016-10-05 DIAGNOSIS — J690 Pneumonitis due to inhalation of food and vomit: Secondary | ICD-10-CM | POA: Diagnosis present

## 2016-10-05 DIAGNOSIS — R4182 Altered mental status, unspecified: Secondary | ICD-10-CM | POA: Diagnosis not present

## 2016-10-05 DIAGNOSIS — E785 Hyperlipidemia, unspecified: Secondary | ICD-10-CM | POA: Diagnosis present

## 2016-10-05 DIAGNOSIS — G8191 Hemiplegia, unspecified affecting right dominant side: Secondary | ICD-10-CM | POA: Diagnosis not present

## 2016-10-05 DIAGNOSIS — E46 Unspecified protein-calorie malnutrition: Secondary | ICD-10-CM

## 2016-10-05 DIAGNOSIS — R918 Other nonspecific abnormal finding of lung field: Secondary | ICD-10-CM | POA: Diagnosis not present

## 2016-10-05 DIAGNOSIS — E876 Hypokalemia: Secondary | ICD-10-CM | POA: Diagnosis present

## 2016-10-05 DIAGNOSIS — L539 Erythematous condition, unspecified: Secondary | ICD-10-CM | POA: Diagnosis not present

## 2016-10-05 DIAGNOSIS — Z7982 Long term (current) use of aspirin: Secondary | ICD-10-CM

## 2016-10-05 DIAGNOSIS — D631 Anemia in chronic kidney disease: Secondary | ICD-10-CM | POA: Diagnosis present

## 2016-10-05 DIAGNOSIS — R2689 Other abnormalities of gait and mobility: Principal | ICD-10-CM | POA: Diagnosis present

## 2016-10-05 DIAGNOSIS — K5730 Diverticulosis of large intestine without perforation or abscess without bleeding: Secondary | ICD-10-CM | POA: Diagnosis not present

## 2016-10-05 DIAGNOSIS — B9689 Other specified bacterial agents as the cause of diseases classified elsewhere: Secondary | ICD-10-CM | POA: Diagnosis present

## 2016-10-05 DIAGNOSIS — E119 Type 2 diabetes mellitus without complications: Secondary | ICD-10-CM | POA: Diagnosis not present

## 2016-10-05 DIAGNOSIS — R195 Other fecal abnormalities: Secondary | ICD-10-CM | POA: Diagnosis not present

## 2016-10-05 DIAGNOSIS — M6281 Muscle weakness (generalized): Secondary | ICD-10-CM | POA: Diagnosis not present

## 2016-10-05 DIAGNOSIS — D509 Iron deficiency anemia, unspecified: Secondary | ICD-10-CM | POA: Diagnosis not present

## 2016-10-05 LAB — CBC
HCT: 28.6 % — ABNORMAL LOW (ref 39.0–52.0)
Hemoglobin: 8.4 g/dL — ABNORMAL LOW (ref 13.0–17.0)
MCH: 23.2 pg — ABNORMAL LOW (ref 26.0–34.0)
MCHC: 29.4 g/dL — ABNORMAL LOW (ref 30.0–36.0)
MCV: 79 fL (ref 78.0–100.0)
Platelets: 131 10*3/uL — ABNORMAL LOW (ref 150–400)
RBC: 3.62 MIL/uL — ABNORMAL LOW (ref 4.22–5.81)
RDW: 20.6 % — ABNORMAL HIGH (ref 11.5–15.5)
WBC: 12.1 10*3/uL — ABNORMAL HIGH (ref 4.0–10.5)

## 2016-10-05 LAB — GLUCOSE, CAPILLARY
GLUCOSE-CAPILLARY: 264 mg/dL — AB (ref 65–99)
Glucose-Capillary: 160 mg/dL — ABNORMAL HIGH (ref 65–99)
Glucose-Capillary: 324 mg/dL — ABNORMAL HIGH (ref 65–99)
Glucose-Capillary: 329 mg/dL — ABNORMAL HIGH (ref 65–99)

## 2016-10-05 LAB — CREATININE, SERUM
Creatinine, Ser: 1.45 mg/dL — ABNORMAL HIGH (ref 0.61–1.24)
GFR calc Af Amer: 53 mL/min — ABNORMAL LOW (ref >60)
GFR calc non Af Amer: 46 mL/min — ABNORMAL LOW (ref >60)

## 2016-10-05 MED ORDER — SORBITOL 70 % SOLN: 30.0000 mL | Freq: Every day | Status: DC | PRN

## 2016-10-05 MED ORDER — AMOXICILLIN-POT CLAVULANATE 875-125 MG PO TABS
1.0000 | ORAL_TABLET | Freq: Two times a day (BID) | ORAL | Status: AC
  Administered 2016-10-05: 1 via ORAL
  Filled 2016-10-05: qty 1

## 2016-10-05 MED ORDER — CLOPIDOGREL BISULFATE 75 MG PO TABS: 75.0000 mg | ORAL_TABLET | Freq: Every day | ORAL | Status: DC

## 2016-10-05 MED ORDER — METOPROLOL TARTRATE 12.5 MG HALF TABLET
12.5000 mg | ORAL_TABLET | Freq: Two times a day (BID) | ORAL | Status: DC
  Administered 2016-10-05 – 2016-10-22 (×34): 12.5 mg via ORAL
  Filled 2016-10-05 (×34): qty 1

## 2016-10-05 MED ORDER — SODIUM CHLORIDE 0.45 % IV SOLN
INTRAVENOUS | Status: DC
  Administered 2016-10-06: via INTRAVENOUS
  Administered 2016-10-06: 950 mL/h via INTRAVENOUS
  Administered 2016-10-08 – 2016-10-15 (×7): via INTRAVENOUS
  Administered 2016-10-16: 900 mL via INTRAVENOUS
  Administered 2016-10-17: 20:00:00 via INTRAVENOUS

## 2016-10-05 MED ORDER — CLOPIDOGREL BISULFATE 75 MG PO TABS
75.0000 mg | ORAL_TABLET | Freq: Every day | ORAL | Status: DC
  Administered 2016-10-06 – 2016-10-11 (×6): 75 mg via ORAL
  Filled 2016-10-05 (×7): qty 1

## 2016-10-05 MED ORDER — INSULIN GLARGINE 100 UNIT/ML ~~LOC~~ SOLN: 12.0000 [IU] | Freq: Every day | SUBCUTANEOUS | 11 refills | Status: DC

## 2016-10-05 MED ORDER — ACETAMINOPHEN 325 MG PO TABS
650.0000 mg | ORAL_TABLET | ORAL | Status: DC | PRN
  Administered 2016-10-08 – 2016-10-30 (×5): 650 mg via ORAL
  Filled 2016-10-05 (×7): qty 2

## 2016-10-05 MED ORDER — ORAL CARE MOUTH RINSE
15.0000 mL | Freq: Two times a day (BID) | OROMUCOSAL | Status: DC
  Administered 2016-10-05 – 2016-10-31 (×37): 15 mL via OROMUCOSAL

## 2016-10-05 MED ORDER — ENOXAPARIN SODIUM 40 MG/0.4ML ~~LOC~~ SOLN: 40.0000 mg | SUBCUTANEOUS | Status: DC

## 2016-10-05 MED ORDER — SODIUM CHLORIDE 0.9 % IV SOLN
INTRAVENOUS | Status: DC
  Administered 2016-10-05: 150 mL/h via INTRAVENOUS

## 2016-10-05 MED ORDER — INSULIN GLARGINE 100 UNIT/ML ~~LOC~~ SOLN
12.0000 [IU] | Freq: Every day | SUBCUTANEOUS | Status: DC
  Administered 2016-10-06 – 2016-10-10 (×5): 12 [IU] via SUBCUTANEOUS
  Filled 2016-10-05 (×5): qty 0.12

## 2016-10-05 MED ORDER — ONDANSETRON HCL 4 MG/2ML IJ SOLN
4.0000 mg | Freq: Four times a day (QID) | INTRAMUSCULAR | Status: DC | PRN
  Administered 2016-10-20: 4 mg via INTRAVENOUS
  Filled 2016-10-05: qty 2

## 2016-10-05 MED ORDER — SENNOSIDES-DOCUSATE SODIUM 8.6-50 MG PO TABS: 1.0000 | ORAL_TABLET | Freq: Every evening | ORAL | Status: DC | PRN

## 2016-10-05 MED ORDER — ATORVASTATIN CALCIUM 80 MG PO TABS
80.0000 mg | ORAL_TABLET | Freq: Every day | ORAL | Status: DC
  Administered 2016-10-06 – 2016-10-30 (×25): 80 mg via ORAL
  Filled 2016-10-05 (×31): qty 1

## 2016-10-05 MED ORDER — VALPROATE SODIUM 250 MG/5ML PO SOLN: 250.0000 mg | Freq: Two times a day (BID) | ORAL | Status: DC

## 2016-10-05 MED ORDER — INSULIN ASPART 100 UNIT/ML ~~LOC~~ SOLN: 0.0000 [IU] | Freq: Every day | SUBCUTANEOUS | 11 refills | Status: DC

## 2016-10-05 MED ORDER — INSULIN ASPART 100 UNIT/ML ~~LOC~~ SOLN
0.0000 [IU] | Freq: Three times a day (TID) | SUBCUTANEOUS | Status: DC
  Administered 2016-10-06: 11 [IU] via SUBCUTANEOUS
  Administered 2016-10-06: 15 [IU] via SUBCUTANEOUS
  Administered 2016-10-07: 5 [IU] via SUBCUTANEOUS
  Administered 2016-10-07: 2 [IU] via SUBCUTANEOUS
  Administered 2016-10-07: 8 [IU] via SUBCUTANEOUS
  Administered 2016-10-08: 5 [IU] via SUBCUTANEOUS
  Administered 2016-10-08: 11 [IU] via SUBCUTANEOUS
  Administered 2016-10-08: 8 [IU] via SUBCUTANEOUS
  Administered 2016-10-09: 3 [IU] via SUBCUTANEOUS
  Administered 2016-10-09: 8 [IU] via SUBCUTANEOUS
  Administered 2016-10-09: 5 [IU] via SUBCUTANEOUS
  Administered 2016-10-10 (×2): 11 [IU] via SUBCUTANEOUS
  Administered 2016-10-10 – 2016-10-11 (×4): 3 [IU] via SUBCUTANEOUS
  Administered 2016-10-12 (×2): 5 [IU] via SUBCUTANEOUS
  Administered 2016-10-12: 8 [IU] via SUBCUTANEOUS
  Administered 2016-10-13: 11 [IU] via SUBCUTANEOUS
  Administered 2016-10-13: 8 [IU] via SUBCUTANEOUS
  Administered 2016-10-14: 3 [IU] via SUBCUTANEOUS
  Administered 2016-10-14: 2 [IU] via SUBCUTANEOUS
  Administered 2016-10-14: 8 [IU] via SUBCUTANEOUS
  Administered 2016-10-15 (×2): 5 [IU] via SUBCUTANEOUS
  Administered 2016-10-16: 2 [IU] via SUBCUTANEOUS
  Administered 2016-10-16 (×2): 3 [IU] via SUBCUTANEOUS
  Administered 2016-10-17: 2 [IU] via SUBCUTANEOUS
  Administered 2016-10-17: 8 [IU] via SUBCUTANEOUS
  Administered 2016-10-17: 5 [IU] via SUBCUTANEOUS
  Administered 2016-10-18: 3 [IU] via SUBCUTANEOUS
  Administered 2016-10-19: 5 [IU] via SUBCUTANEOUS
  Administered 2016-10-19 – 2016-10-23 (×6): 3 [IU] via SUBCUTANEOUS
  Administered 2016-10-25 (×2): 5 [IU] via SUBCUTANEOUS
  Administered 2016-10-26: 3 [IU] via SUBCUTANEOUS
  Administered 2016-10-26 (×2): 2 [IU] via SUBCUTANEOUS
  Administered 2016-10-27: 3 [IU] via SUBCUTANEOUS
  Administered 2016-10-27: 5 [IU] via SUBCUTANEOUS
  Administered 2016-10-27: 2 [IU] via SUBCUTANEOUS
  Administered 2016-10-28 (×2): 3 [IU] via SUBCUTANEOUS
  Administered 2016-10-28 – 2016-10-30 (×5): 2 [IU] via SUBCUTANEOUS
  Administered 2016-10-30 (×2): 3 [IU] via SUBCUTANEOUS
  Administered 2016-10-31: 8 [IU] via SUBCUTANEOUS

## 2016-10-05 MED ORDER — LEVOTHYROXINE SODIUM 75 MCG PO TABS
75.0000 ug | ORAL_TABLET | Freq: Every day | ORAL | Status: DC
  Administered 2016-10-06 – 2016-10-31 (×25): 75 ug via ORAL
  Filled 2016-10-05 (×25): qty 1

## 2016-10-05 MED ORDER — VALPROATE SODIUM 250 MG/5ML PO SOLN
250.0000 mg | Freq: Two times a day (BID) | ORAL | Status: DC
  Administered 2016-10-05: 250 mg via ORAL
  Filled 2016-10-05 (×2): qty 5

## 2016-10-05 MED ORDER — INSULIN ASPART 100 UNIT/ML ~~LOC~~ SOLN: 0.0000 [IU] | Freq: Three times a day (TID) | SUBCUTANEOUS | 11 refills | Status: DC

## 2016-10-05 MED ORDER — ASPIRIN 325 MG PO TABS: 325.0000 mg | ORAL_TABLET | Freq: Every day | ORAL | Status: DC

## 2016-10-05 MED ORDER — RESOURCE THICKENUP CLEAR PO POWD
ORAL | Status: DC | PRN
  Filled 2016-10-05: qty 125

## 2016-10-05 MED ORDER — ACETAMINOPHEN 325 MG PO TABS: 650.0000 mg | ORAL_TABLET | ORAL | Status: DC | PRN

## 2016-10-05 MED ORDER — ASPIRIN 325 MG PO TABS
325.0000 mg | ORAL_TABLET | Freq: Every day | ORAL | Status: DC
  Administered 2016-10-06 – 2016-10-11 (×6): 325 mg via ORAL
  Filled 2016-10-05 (×6): qty 1

## 2016-10-05 MED ORDER — METOPROLOL TARTRATE 25 MG PO TABS: 12.5000 mg | ORAL_TABLET | Freq: Two times a day (BID) | ORAL | Status: DC

## 2016-10-05 MED ORDER — ENOXAPARIN SODIUM 40 MG/0.4ML ~~LOC~~ SOLN
40.0000 mg | SUBCUTANEOUS | Status: DC
  Administered 2016-10-05 – 2016-10-07 (×3): 40 mg via SUBCUTANEOUS
  Filled 2016-10-05 (×3): qty 0.4

## 2016-10-05 MED ORDER — AMOXICILLIN-POT CLAVULANATE 875-125 MG PO TABS: 1.0000 | ORAL_TABLET | Freq: Two times a day (BID) | ORAL | Status: DC

## 2016-10-05 MED ORDER — INSULIN GLARGINE 100 UNIT/ML ~~LOC~~ SOLN
12.0000 [IU] | Freq: Every day | SUBCUTANEOUS | Status: DC
  Administered 2016-10-05: 12 [IU] via SUBCUTANEOUS
  Filled 2016-10-05: qty 0.12

## 2016-10-05 MED ORDER — VALPROATE SODIUM 250 MG/5ML PO SOLN
250.0000 mg | Freq: Two times a day (BID) | ORAL | Status: DC
  Administered 2016-10-05 – 2016-10-31 (×52): 250 mg via ORAL
  Filled 2016-10-05 (×54): qty 5

## 2016-10-05 MED ORDER — ONDANSETRON HCL 4 MG PO TABS
4.0000 mg | ORAL_TABLET | Freq: Four times a day (QID) | ORAL | Status: DC | PRN
Start: 2016-10-05 — End: 2016-10-31

## 2016-10-05 NOTE — Care Management Note (Signed)
Case Management Note Previous CM note initiated by Leone Havenaylor, Deborah Clinton, RN-10/03/2016, 2:11 PM    Patient Details  Name: Douglas CohoJames L Hermans MRN: [id number] Date of Birth: 08/18/1941  Subjective/Objective:     Presents with CVA, has expressive Cyndie Mullahphasia, CSW following for SNF placement.               Action/Plan:   Expected Discharge Date:  10/05/16          Expected Discharge Plan:  Skilled Nursing Facility  In-House Referral:  Clinical Social Work  Discharge planning Services  CM Consult  Post Acute Care Choice:    Choice offered to:     DME Arranged:    DME Agency:     HH Arranged:    HH Agency:     Status of Service:  Completed, signed off  If discussed at [employer] Length of Stay Meetings, dates discussed:    Discharge Disposition: IP rehab   Additional Comments:  10/05/16- 1420- Donn PieriniKristi Sarita Hakanson RN, CM- plan for pt to d/c to [facility]- confirmed with Fae PippinMelissa Bowie with CIR that they have a bed for pt today.   Zenda AlpersWebster, [place] AlamitosKristi Hall, RN 10/05/2016, 2:26 PM [phone]

## 2016-10-05 NOTE — Progress Notes (Addendum)
Inpatient Rehabilitation  Following along for potential IP Rehab admission to reduce burden of care.  I await medical clearance for tachycardia to ensure stability for potential admission today.  I have also called patient's niece, Harriett Sineancy who is in agreement.  Please call with questions.  Charlane FerrettiMelissa Shaquel Chavous, M.A., CCC/SLP Admission Coordinator  [facility]  Cell [phone]

## 2016-10-05 NOTE — Progress Notes (Signed)
Douglas Karis Juba, MD Physician Signed Physical Medicine and Rehabilitation  Consult Note Date of Service: 09/27/2016 2:19 PM  Related encounter: ED to Hosp-Admission (Current) from 09/26/2016 in [facility] 3S STEPDOWN     Expand All Collapse All   [] Hide copied text [] Hover for attribution information      Physical Medicine and Rehabilitation Consult Reason for Consult: Left MCA infarct Referring Physician: [facility]   HPI: Douglas [name] is a 75 y.o. right handed male with history of diabetes mellitus, hypertension, hyperlipidemia, chronic kidney disease with creatinine 1.62. Per chart review patient lives with a roommate who works during the day. One level home with 3 steps to entry. Patient independent prior to admission. Presented 09/26/2016 with aphasia and severe right-sided weakness. CT of the brain shows suspect edema within the left insular cortex, anterior temporal lobe and inferior frontal lobe. No hemorrhage. Troponin mildly elevated 0.64. MRI showed left MCA infarct. Multiple other punctate foci of acute ischemia scattered throughout both cerebral and cerebellar hemispheres. MRA of the head and neck showed occlusion of left MCA at the level of the bifurcation. Patient did not receive TPA. Echocardiogram with ejection fraction of 55% no wall motion abnormalities. 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 Neurology consulted presently on aspirin for CVA prophylaxis. Subcutaneous Lovenox for DVT prophylaxis. Physical and occupational therapy evaluations completed with recommendations of physical medicine rehabilitation consult.   Review of Systems  Unable to perform ROS: Language       Past Medical History:  Diagnosis Date  . CKD (chronic kidney disease)   . DM (diabetes mellitus) (HCC)   . Hyperlipidemia   . Hypertension    History reviewed. No pertinent surgical history.      Family History  Problem Relation Age of Onset  . Heart  disease Mother   . Cancer Father   . Stroke Brother 50   Social History:  reports that he quit smoking about 34 years ago. He has never used smokeless tobacco. He reports that he drinks alcohol. He reports that he does not use drugs. Allergies: No Known Allergies       Medications Prior to Admission  Medication Sig Dispense Refill  . atorvastatin (LIPITOR) 80 MG tablet Take 80 mg by mouth daily.    . Insulin Glargine (BASAGLAR KWIKPEN) 100 UNIT/ML SOPN Inject 60 Units into the skin daily. Titrate as directed at the same time every day    . levothyroxine (SYNTHROID, LEVOTHROID) 75 MCG tablet Take 75 mcg by mouth daily before breakfast.     . lisinopril (PRINIVIL,ZESTRIL) 20 MG tablet Take 20 mg by mouth daily.    . TRADJENTA 5 MG TABS tablet Take 5 mg by mouth every morning.     Marland Kitchen aspirin 81 MG tablet Take 81 mg by mouth daily.    . cholecalciferol (VITAMIN D) 1000 UNITS tablet Take 1,000 Units by mouth daily.    . ferrous sulfate 325 (65 FE) MG tablet Take 325 mg by mouth daily with breakfast.      Home: Home Living Family/patient expects to be discharged to:: Inpatient rehab Additional Comments: pt lived with a roomate who works during the day. 1 story home with 3-4 steps to enter.   Functional History: Prior Function Level of Independence: Independent Comments: pt didn't use AD, was driving, cooking, bathing, dressing all independently.  Functional Status:  Mobility: Bed Mobility Overal bed mobility: Needs Assistance Bed Mobility: Rolling, Sidelying to Sit, Sit to Supine Rolling: Min assist Sidelying to  sit: Mod assist, +2 for physical assistance Sit to supine: Mod assist, +2 for physical assistance General bed mobility comments: max directional verbal and tactile cues to complete task. pt initiated with bringing LEs off EOB but required modA to complete, modA for trunk management in/out of bed Transfers Overall transfer level: Needs assistance Equipment  used: 2 person hand held assist Transfers: Sit to/from Stand Sit to Stand: Min assist, +2 physical assistance General transfer comment: pt initiated powering up but once standing required mod/maxAx2 due to strong R lateral knee and locking out of R knee. Pt HR increased into 130s, pt returned to sitting EOB Ambulation/Gait General Gait Details: unable due to lethargy and increased HR    ADL: ADL Overall ADL's : Needs assistance/impaired Eating/Feeding: Total assistance Eating/Feeding Details (indicate cue type and reason): Pt very lethargic Grooming: Oral care, Wash/dry face, Maximal assistance, Sitting, Cueing for compensatory techniques Grooming Details (indicate cue type and reason): hand over hand assist given for washing face.  Pt put toothbrush in mouth but unable to prepare brush or actually brush teeth. Upper Body Bathing: Total assistance Lower Body Bathing: Total assistance Upper Body Dressing : Total assistance Lower Body Dressing: Total assistance Toilet Transfer: +2 for physical assistance, Maximal assistance, Stand-pivot, BSC Toileting- Clothing Manipulation and Hygiene: Total assistance, +2 for physical assistance, Sit to/from stand Functional mobility during ADLs: +2 for physical assistance, Maximal assistance General ADL Comments: Pt dependent for most adls at this time.  Pt very lethargic, difficult to keep awake and HR rising with activity.  Given pt's troponin level, pt was returned to bed.  Cognition: Cognition Overall Cognitive Status: Impaired/Different from baseline Orientation Level: Other (comment) (unable to assess) Cognition Arousal/Alertness: Lethargic Behavior During Therapy: Flat affect Overall Cognitive Status: Impaired/Different from baseline Area of Impairment: Following commands, Safety/judgement, Awareness, Problem solving, Attention Current Attention Level: Focused Following Commands: Follows one step commands with increased time, Follows one  step commands inconsistently Safety/Judgement: Decreased awareness of safety, Decreased awareness of deficits Awareness: Intellectual Problem Solving: Slow processing, Decreased initiation, Difficulty sequencing, Requires verbal cues, Requires tactile cues General Comments: pt very lethargic but did become more alert s/p 15 min of working with therapy. Pt extremely delayed processing but did verbalize yes/no questions 25% of time  Blood pressure (!) 167/59, pulse 91, temperature 97.7 F (36.5 C), temperature source Oral, resp. rate 19, height 5\' 10"  (1.778 m), weight 72.3 kg (159 lb 4.8 oz), SpO2 99 %. Physical Exam  Vitals reviewed. Constitutional: He appears well-developed and well-nourished.  HENT:  Head: Normocephalic and atraumatic.  Eyes: Conjunctivae are normal.  Pupils reactive to light  Neck: Normal range of motion. Neck supple. No thyromegaly present.  Cardiovascular: Regular rhythm.   Tachycardia  Respiratory: Effort normal and breath sounds normal.  GI: Soft. Bowel sounds are normal.  Musculoskeletal: He exhibits no edema or tenderness.  Neurological: He is alert.  Right facial weakness Unable to accurately assess motor or sensory, but moving all 4 extremities Not consistently following commands Global aphasia, ?Exp>Rec Dysarthria DTRs 3+ RUE/RLE  Skin: Skin is warm and dry.  Psychiatric:  Unable to assess due to mentation    Lab Results Last 24 Hours       Results for orders placed or performed during the hospital encounter of 09/26/16 (from the past 24 hour(s))  Comprehensive metabolic panel     Status: Abnormal   Collection Time: 09/26/16  6:00 PM  Result Value Ref Range   Sodium 138 135 - 145 mmol/L  Potassium 3.7 3.5 - 5.1 mmol/L   Chloride 105 101 - 111 mmol/L   CO2 21 (L) 22 - 32 mmol/L   Glucose, Bld 89 65 - 99 mg/dL   BUN 18 6 - 20 mg/dL   Creatinine, Ser 1.61 (H) 0.61 - 1.24 mg/dL   Calcium 9.4 8.9 - 09.6 mg/dL   Total Protein 6.7 6.5  - 8.1 g/dL   Albumin 3.3 (L) 3.5 - 5.0 g/dL   AST 31 15 - 41 U/L   ALT 15 (L) 17 - 63 U/L   Alkaline Phosphatase 135 (H) 38 - 126 U/L   Total Bilirubin 1.1 0.3 - 1.2 mg/dL   GFR calc non Af Amer 40 (L) >60 mL/min   GFR calc Af Amer 46 (L) >60 mL/min   Anion gap 12 5 - 15  Brain natriuretic peptide     Status: None   Collection Time: 09/26/16  6:00 PM  Result Value Ref Range   B Natriuretic Peptide 84.4 0.0 - 100.0 pg/mL  I-stat troponin, ED     Status: Abnormal   Collection Time: 09/26/16  6:32 PM  Result Value Ref Range   Troponin i, poc 0.23 (HH) 0.00 - 0.08 ng/mL   Comment NOTIFIED PHYSICIAN    Comment 3          I-stat chem 8, ed     Status: Abnormal   Collection Time: 09/26/16  6:34 PM  Result Value Ref Range   Sodium 138 135 - 145 mmol/L   Potassium 3.8 3.5 - 5.1 mmol/L   Chloride 105 101 - 111 mmol/L   BUN 20 6 - 20 mg/dL   Creatinine, Ser 0.45 (H) 0.61 - 1.24 mg/dL   Glucose, Bld 85 65 - 99 mg/dL   Calcium, Ion 4.09 8.11 - 1.40 mmol/L   TCO2 23 0 - 100 mmol/L   Hemoglobin 10.2 (L) 13.0 - 17.0 g/dL   HCT 91.4 (L) 78.2 - 95.6 %  I-Stat CG4 Lactic Acid, ED     Status: None   Collection Time: 09/26/16  6:34 PM  Result Value Ref Range   Lactic Acid, Venous 1.28 0.5 - 1.9 mmol/L  I-stat troponin, ED     Status: Abnormal   Collection Time: 09/26/16  9:30 PM  Result Value Ref Range   Troponin i, poc 0.27 (HH) 0.00 - 0.08 ng/mL   Comment NOTIFIED PHYSICIAN    Comment 3          Glucose, capillary     Status: Abnormal   Collection Time: 09/27/16  2:53 AM  Result Value Ref Range   Glucose-Capillary 38 (LL) 65 - 99 mg/dL   Comment 1 Notify RN   Troponin I     Status: Abnormal   Collection Time: 09/27/16  3:17 AM  Result Value Ref Range   Troponin I 0.62 (HH) <0.03 ng/mL  Lipid panel     Status: Abnormal   Collection Time: 09/27/16  3:17 AM  Result Value Ref Range   Cholesterol 173 0 - 200 mg/dL   Triglycerides 213 <086  mg/dL   HDL 33 (L) >57 mg/dL   Total CHOL/HDL Ratio 5.2 RATIO   VLDL 20 0 - 40 mg/dL   LDL Cholesterol 846 (H) 0 - 99 mg/dL  Glucose, capillary     Status: Abnormal   Collection Time: 09/27/16  3:28 AM  Result Value Ref Range   Glucose-Capillary 152 (H) 65 - 99 mg/dL  MRSA PCR Screening  Status: None   Collection Time: 09/27/16  3:40 AM  Result Value Ref Range   MRSA by PCR NEGATIVE NEGATIVE  Troponin I     Status: Abnormal   Collection Time: 09/27/16  8:10 AM  Result Value Ref Range   Troponin I 0.64 (HH) <0.03 ng/mL  Glucose, capillary     Status: Abnormal   Collection Time: 09/27/16  8:10 AM  Result Value Ref Range   Glucose-Capillary 62 (L) 65 - 99 mg/dL   Comment 1 Notify RN    Comment 2 Document in Chart   Glucose, capillary     Status: Abnormal   Collection Time: 09/27/16  9:08 AM  Result Value Ref Range   Glucose-Capillary 109 (H) 65 - 99 mg/dL   Comment 1 Notify RN    Comment 2 Document in Chart   Glucose, capillary     Status: None   Collection Time: 09/27/16 12:02 PM  Result Value Ref Range   Glucose-Capillary 91 65 - 99 mg/dL   Comment 1 Notify RN    Comment 2 Document in Chart       Imaging Results (Last 48 hours)  Dg Chest 2 View  Result Date: 09/26/2016 CLINICAL DATA:  Altered mental status. EXAM: CHEST  2 VIEW COMPARISON:  None. FINDINGS: Mild cardiomegaly is noted. No pneumothorax or pleural effusion is noted. Left lung is clear. Ill-defined opacity is seen in the right lung concerning for possible pneumonia or edema. Bony thorax is unremarkable. IMPRESSION: Ill-defined opacity seen in right lung concerning for possible pneumonia or edema. Electronically Signed   By: Lupita Raider, M.D.   On: 09/26/2016 19:08   Ct Head Wo Contrast  Result Date: 09/26/2016 CLINICAL DATA:  Found down on floor today.  Aphasia EXAM: CT HEAD WITHOUT CONTRAST TECHNIQUE: Contiguous axial images were obtained from the base of the skull  through the vertex without intravenous contrast. COMPARISON:  None. FINDINGS: Brain: There is no acute hemorrhage. No focal mass lesion. Suspected loss of the gray-white matter differentiation involving the left insula, inferior frontal, and anterior temporal lobes. Moderate atrophy. Ventricles slightly enlarged but felt secondary to atrophy. Vascular: Carotid artery calcifications. Questionable mildly dense distal left MCA. Skull: Mastoid air cells are clear. No fracture. Prominent mandibular condyles with degenerative changes. Sinuses/Orbits: No acute fluid levels in the sinuses. Mild mucosal thickening in the ethmoid sinuses. No acute orbital abnormality. Other: None IMPRESSION: 1. Suspect edema within the left insular cortex, anterior temporal lobe and left inferior frontal lobe. MRI is recommended for further evaluation. Possible mildly dense distal left MCA artery. 2. No hemorrhage. 3. Atrophy Electronically Signed   By: Jasmine Pang M.D.   On: 09/26/2016 21:04   Mr Angiogram Head Wo Contrast  Result Date: 09/27/2016 CLINICAL DATA:  Right-sided paralysis and aphasia. EXAM: MRI HEAD WITHOUT AND WITH CONTRAST MRA HEAD WITHOUT CONTRAST MRA NECK WITHOUT AND WITH CONTRAST TECHNIQUE: Multiplanar, multiecho pulse sequences of the brain and surrounding structures were obtained without and with intravenous contrast. Angiographic images of the Circle of Willis were obtained using MRA technique without intravenous contrast. Angiographic images of the neck were obtained using MRA technique without and with intravenous contrast. Carotid stenosis measurements (when applicable) are obtained utilizing NASCET criteria, using the distal internal carotid diameter as the denominator. CONTRAST:  15 mL MultiHance IV COMPARISON:  Head CT 09/26/2016 FINDINGS: MRI HEAD FINDINGS Brain: The midline structures are normal. There is extensive diffusion restriction in the left insular cortex and frontal operculum. There are  additional small foci of diffusion restriction scattered throughout both cerebral hemispheres and both sides of the cerebellum. There is no midline shift or significant mass effect. No evidence of acute hemorrhage. There is multifocal hyperintense T2-weighted signal within the periventricular white matter, most often seen in the setting of chronic microvascular ischemia. No hydrocephalus or extra-axial fluid collection. No lobar predominant atrophy pattern. There are areas of contrast enhancement within the cerebellum. Within the peripheral aspect of the left hemisphere, in the area of peripheral contrast enhancement measures 1.9 x 1.1 cm. A smaller focus near the cerebellar midline measures 4 mm. There is an additional focus of contrast enhancement along the right precentral gyrus. Vascular: Major intracranial arterial and venous sinus flow voids are preserved. Area of hemosiderin deposition in the left cerebellar hemisphere. There is focal susceptibility in the location of the left MCA bifurcation, which corresponds to the occlusion demonstrated on time-of-flight images. Skull and upper cervical spine: The visualized skull base, calvarium, upper cervical spine and extracranial soft tissues are normal. Sinuses/Orbits: No fluid levels or advanced mucosal thickening. No mastoid effusion. Normal orbits. MRA HEAD FINDINGS Intracranial internal carotid arteries: Normal. Anterior cerebral arteries: Normal. Middle cerebral arteries: There is occlusion of the left middle cerebral artery at the site of the bifurcation. There is minimal flow related enhancement seen within the distal left MCA distribution. The right MCA is normal. Posterior communicating arteries: Present bilaterally. Posterior cerebral arteries: Normal. Basilar artery: Normal. Vertebral arteries: Codominant. Normal. Superior cerebellar arteries: Normal. Anterior inferior cerebellar arteries: Normal. Posterior inferior cerebellar arteries: Normal. MRA NECK  FINDINGS There is a normal 3 vessel aortic arch branch pattern. The bilateral subclavian arteries are normal. There is approximately 30% stenosis at the proximal left internal carotid artery. No right carotid stenosis. The cervical courses of both vertebral arteries are normal. IMPRESSION: 1. 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 Multiple other punctate foci of acute ischemia scattered throughout both cerebral and cerebellar hemispheres suggest a central embolic process. No hemorrhage or mass effect. 2. 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 3. Multiple foci of contrast enhancement within the cerebellum and right frontal lobe, with the largest focus measuring up to 2 cm. Given the other findings, these findings may be secondary to subacute ischemia. However, follow-up imaging (MRI brain with and without contrast) is recommended in 4-6 weeks to exclude the possibility of a neoplastic process, primarily metastatic disease. 4. 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 Otherwise normal MRA of the neck. Critical Value/emergent results were called by telephone at the time of interpretation on 09/27/2016 at 12:08 am to [facility] who verbally acknowledged these results. Electronically Signed   By: Deatra Robinson M.D.   On: 09/27/2016 00:18   Mr Angiogram Neck W Or Wo Contrast  Result Date: 09/27/2016 CLINICAL DATA:  Right-sided paralysis and aphasia. EXAM: MRI HEAD WITHOUT AND WITH CONTRAST MRA HEAD WITHOUT CONTRAST MRA NECK WITHOUT AND WITH CONTRAST TECHNIQUE: Multiplanar, multiecho pulse sequences of the brain and surrounding structures were obtained without and with intravenous contrast. Angiographic images of the Circle of Willis were obtained using MRA technique without intravenous contrast. Angiographic images of the neck were obtained using MRA technique without and with intravenous contrast. Carotid stenosis  measurements (when applicable) are obtained utilizing NASCET criteria, using the distal internal carotid diameter as the denominator. CONTRAST:  15 mL MultiHance IV COMPARISON:  Head CT 09/26/2016 FINDINGS: MRI HEAD FINDINGS Brain: The midline structures  are normal. There is extensive diffusion restriction in the left insular cortex and frontal operculum. There are additional small foci of diffusion restriction scattered throughout both cerebral hemispheres and both sides of the cerebellum. There is no midline shift or significant mass effect. No evidence of acute hemorrhage. There is multifocal hyperintense T2-weighted signal within the periventricular white matter, most often seen in the setting of chronic microvascular ischemia. No hydrocephalus or extra-axial fluid collection. No lobar predominant atrophy pattern. There are areas of contrast enhancement within the cerebellum. Within the peripheral aspect of the left hemisphere, in the area of peripheral contrast enhancement measures 1.9 x 1.1 cm. A smaller focus near the cerebellar midline measures 4 mm. There is an additional focus of contrast enhancement along the right precentral gyrus. Vascular: Major intracranial arterial and venous sinus flow voids are preserved. Area of hemosiderin deposition in the left cerebellar hemisphere. There is focal susceptibility in the location of the left MCA bifurcation, which corresponds to the occlusion demonstrated on time-of-flight images. Skull and upper cervical spine: The visualized skull base, calvarium, upper cervical spine and extracranial soft tissues are normal. Sinuses/Orbits: No fluid levels or advanced mucosal thickening. No mastoid effusion. Normal orbits. MRA HEAD FINDINGS Intracranial internal carotid arteries: Normal. Anterior cerebral arteries: Normal. Middle cerebral arteries: There is occlusion of the left middle cerebral artery at the site of the bifurcation. There is minimal flow related enhancement  seen within the distal left MCA distribution. The right MCA is normal. Posterior communicating arteries: Present bilaterally. Posterior cerebral arteries: Normal. Basilar artery: Normal. Vertebral arteries: Codominant. Normal. Superior cerebellar arteries: Normal. Anterior inferior cerebellar arteries: Normal. Posterior inferior cerebellar arteries: Normal. MRA NECK FINDINGS There is a normal 3 vessel aortic arch branch pattern. The bilateral subclavian arteries are normal. There is approximately 30% stenosis at the proximal left internal carotid artery. No right carotid stenosis. The cervical courses of both vertebral arteries are normal. IMPRESSION: 1. 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 Multiple other punctate foci of acute ischemia scattered throughout both cerebral and cerebellar hemispheres suggest a central embolic process. No hemorrhage or mass effect. 2. 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 3. Multiple foci of contrast enhancement within the cerebellum and right frontal lobe, with the largest focus measuring up to 2 cm. Given the other findings, these findings may be secondary to subacute ischemia. However, follow-up imaging (MRI brain with and without contrast) is recommended in 4-6 weeks to exclude the possibility of a neoplastic process, primarily metastatic disease. 4. 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 Otherwise normal MRA of the neck. Critical Value/emergent results were called by telephone at the time of interpretation on 09/27/2016 at 12:08 am to [facility] who verbally acknowledged these results. Electronically Signed   By: Deatra Robinson M.D.   On: 09/27/2016 00:18   Mr Laqueta Jean BJ Contrast  Result Date: 09/27/2016 CLINICAL DATA:  Right-sided paralysis and aphasia. EXAM: MRI HEAD WITHOUT AND WITH CONTRAST MRA HEAD WITHOUT CONTRAST MRA NECK WITHOUT AND WITH CONTRAST TECHNIQUE: Multiplanar,  multiecho pulse sequences of the brain and surrounding structures were obtained without and with intravenous contrast. Angiographic images of the Circle of Willis were obtained using MRA technique without intravenous contrast. Angiographic images of the neck were obtained using MRA technique without and with intravenous contrast. Carotid stenosis measurements (when applicable) are obtained utilizing NASCET criteria, using the distal internal carotid diameter as the denominator. CONTRAST:  15 mL  MultiHance IV COMPARISON:  Head CT 09/26/2016 FINDINGS: MRI HEAD FINDINGS Brain: The midline structures are normal. There is extensive diffusion restriction in the left insular cortex and frontal operculum. There are additional small foci of diffusion restriction scattered throughout both cerebral hemispheres and both sides of the cerebellum. There is no midline shift or significant mass effect. No evidence of acute hemorrhage. There is multifocal hyperintense T2-weighted signal within the periventricular white matter, most often seen in the setting of chronic microvascular ischemia. No hydrocephalus or extra-axial fluid collection. No lobar predominant atrophy pattern. There are areas of contrast enhancement within the cerebellum. Within the peripheral aspect of the left hemisphere, in the area of peripheral contrast enhancement measures 1.9 x 1.1 cm. A smaller focus near the cerebellar midline measures 4 mm. There is an additional focus of contrast enhancement along the right precentral gyrus. Vascular: Major intracranial arterial and venous sinus flow voids are preserved. Area of hemosiderin deposition in the left cerebellar hemisphere. There is focal susceptibility in the location of the left MCA bifurcation, which corresponds to the occlusion demonstrated on time-of-flight images. Skull and upper cervical spine: The visualized skull base, calvarium, upper cervical spine and extracranial soft tissues are normal.  Sinuses/Orbits: No fluid levels or advanced mucosal thickening. No mastoid effusion. Normal orbits. MRA HEAD FINDINGS Intracranial internal carotid arteries: Normal. Anterior cerebral arteries: Normal. Middle cerebral arteries: There is occlusion of the left middle cerebral artery at the site of the bifurcation. There is minimal flow related enhancement seen within the distal left MCA distribution. The right MCA is normal. Posterior communicating arteries: Present bilaterally. Posterior cerebral arteries: Normal. Basilar artery: Normal. Vertebral arteries: Codominant. Normal. Superior cerebellar arteries: Normal. Anterior inferior cerebellar arteries: Normal. Posterior inferior cerebellar arteries: Normal. MRA NECK FINDINGS There is a normal 3 vessel aortic arch branch pattern. The bilateral subclavian arteries are normal. There is approximately 30% stenosis at the proximal left internal carotid artery. No right carotid stenosis. The cervical courses of both vertebral arteries are normal. IMPRESSION: 1. 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 Multiple other punctate foci of acute ischemia scattered throughout both cerebral and cerebellar hemispheres suggest a central embolic process. No hemorrhage or mass effect. 2. 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 3. Multiple foci of contrast enhancement within the cerebellum and right frontal lobe, with the largest focus measuring up to 2 cm. Given the other findings, these findings may be secondary to subacute ischemia. However, follow-up imaging (MRI brain with and without contrast) is recommended in 4-6 weeks to exclude the possibility of a neoplastic process, primarily metastatic disease. 4. 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 Otherwise normal MRA of the neck. Critical Value/emergent results were called by telephone at the time of interpretation on 09/27/2016 at 12:08 am to [facility] who verbally acknowledged these results. Electronically Signed   By: Deatra Robinson M.D.   On: 09/27/2016 00:18     Assessment/Plan: Diagnosis: Left MCA infarct Labs and images independently reviewed.  Records reviewed and summated above. Stroke: Continue secondary stroke prophylaxis and Risk Factor Modification listed below:   Antiplatelet therapy:   Blood Pressure Management:  Continue current medication with prn's with permisive HTN per primary team Statin Agent:   ?Right sided hemiparesis: fit for orthosis to prevent contractures (resting hand splint for day, wrist cock up splint at night, PRAFO, etc) Motor recovery: Fluoxetine  1. Does the need for close, 24 hr/day medical  supervision in concert with the patient's rehab needs make it unreasonable for this patient to be served in a less intensive setting? Yes  2. Co-Morbidities requiring supervision/potential complications: diabetes mellitus (Monitor in accordance with exercise and adjust meds as necessary), HTN (monitor and provide prns in accordance with increased physical exertion and pain), hyperlipidemia (cont meds), chronic kidney disease (avoid nephrotoxic meds), systolic CHF (Monitor in accordance with increased physical activity and avoid UE resistance excercises), Tachycardia (monitor in accordance with pain and increasing activity), elevated troponins (recs per Cardiology) 3. Due to bladder management, safety, disease management, medication administration and patient education, does the patient require 24 hr/day rehab nursing? Yes 4. Does the patient require coordinated care of a physician, rehab nurse, PT (1-2 hrs/day, 5 days/week), OT (1-2 hrs/day, 5 days/week) and SLP (1-2 hrs/day, 5 days/week) to address physical and functional deficits in the context of the above medical diagnosis(es)? Yes Addressing deficits in the following areas: balance, endurance, locomotion, strength, transferring, bowel/bladder control,  bathing, dressing, toileting, cognition, speech, language, swallowing and psychosocial support 5. Can the patient actively participate in an intensive therapy program of at least 3 hrs of therapy per day at least 5 days per week? Potentially 6. The potential for patient to make measurable gains while on inpatient rehab is excellent 7. Anticipated functional outcomes upon discharge from inpatient rehab are supervision and min assist  with PT, supervision and min assist with OT, min assist with SLP. 8. Estimated rehab length of stay to reach the above functional goals is: 18-22 days. 9. Does the patient have adequate social supports and living environment to accommodate these discharge functional goals? Potentially 10. Anticipated D/C setting: Home 11. Anticipated post D/C treatments: HH therapy and Home excercise program 12. Overall Rehab/Functional Prognosis: good  RECOMMENDATIONS: This patient's condition is appropriate for continued rehabilitative care in the following setting: Like CIR if care giver support after completion of medically stable. Patient has agreed to participate in recommended program. Potentially Note that insurance prior authorization may be required for reimbursement for recommended care.  Comment: Rehab Admissions Coordinator to follow up.  Douglas Karis JubaAnil Patel, MD, [facility] 09/27/2016    Revision History                   Routing History

## 2016-10-05 NOTE — Discharge Instructions (Signed)
Follow with Primary MD Douglas [name],DONNA RUTH, MD After discharge from CIR  Get CBC, CMP, 2 view Chest X ray checked  by Primary MD next visit.    Activity: As tolerated with Full fall precautions use walker/cane & assistance as needed   Disposition CIR   Diet: Dysphagia 2 with nectar thick  , with feeding assistance and aspiration precautions.  On your next visit with your primary care physician please Get Medicines reviewed and adjusted.   Please request your Prim.MD to go over all Hospital Tests and Procedure/Radiological results at the follow up, please get all Hospital records sent to your Prim MD by signing hospital release before you go home.   If you experience worsening of your admission symptoms, develop shortness of breath, life threatening emergency, suicidal or homicidal thoughts you must seek medical attention immediately by calling 911 or calling your MD immediately  if symptoms less severe.  You Must read complete instructions/literature along with all the possible adverse reactions/side effects for all the Medicines you take and that have been prescribed to you. Take any new Medicines after you have completely understood and accpet all the possible adverse reactions/side effects.   Do not drive, operating heavy machinery, perform activities at heights, swimming or participation in water activities or provide baby sitting services if your were admitted for syncope or siezures until you have seen by Primary MD or a Neurologist and advised to do so again.  Do not drive when taking Pain medications.    Do not take more than prescribed Pain, Sleep and Anxiety Medications  Special Instructions: If you have smoked or chewed Tobacco  in the last 2 yrs please stop smoking, stop any regular Alcohol  and or any Recreational drug use.  Wear Seat belts while driving.   Please note  You were cared for by a hospitalist during your hospital stay. If you have any questions about your  discharge medications or the care you received while you were in the hospital after you are discharged, you can call the unit and asked to speak with the hospitalist on call if the hospitalist that took care of you is not available. Once you are discharged, your primary care physician will handle any further medical issues. Please note that NO REFILLS for any discharge medications will be authorized once you are discharged, as it is imperative that you return to your primary care physician (or establish a relationship with a primary care physician if you do not have one) for your aftercare needs so that they can reassess your need for medications and monitor your lab values.

## 2016-10-05 NOTE — Progress Notes (Signed)
Speech Language Pathology Treatment: Dysphagia;Cognitive-Linquistic  Patient Details Name: Douglas [name] MRN: [id number] DOB: 05/28/1941 Today's Date: 10/05/2016 Time: [id number] SLP Time Calculation (min) (ACUTE ONLY): 40 min  Assessment / Plan / Recommendation Clinical Impression  Patient positioned partially upright in bed, RN reports pt has finished breakfast, coughing was noted x2. Aphasia treatment: Patient is alert and responds verbally "yes" and "no" appropriately in response to basic questions. Benefits from verbal and demonstration cues for use of gestures to improve expressive communication. Attempted automatic speech with clinician model; patient is unable to produce. Dysphagia treatment targeted upgraded PO trials of mechanical soft texture and training in compensatory strategies to improve swallow function. Patient is noted with rapid intake of nectar-thick liquids via cup, followed by delayed cough. Benefits from verbal, visual and tactile cues to reduce bolus size and to implement L head tilt. Use of these strategies reduces oral spillage and overt signs of aspiration. Continued trials with pureed and mechanical soft textures with left head tilt; patient is able to implement with demonstration and occasional tactile cues. Recommend upgrade to dysphagia 2/ nectar-thick liquids with full supervision and cuing for strategies of slow rate, single bites/sips, alternating liquids and solids, and left head tilt to facilitate bolus formation and clearance.    HPI HPI: 75 y.o. male with medical history significant of HTN, [name], DM, [name] presenting with a stroke. Pt with noted R sided weakness and expressive aphasia. 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      SLP Plan  Continue with current plan of care     Recommendations  Diet recommendations: Dysphagia 2 (fine chop);Nectar-thick liquid Liquids provided via: Cup Medication Administration: Crushed  with puree Supervision: Full supervision/cueing for compensatory strategies;Staff to assist with self feeding Compensations: Slow rate;Small sips/bites;Clear throat intermittently;Lingual sweep for clearance of pocketing Postural Changes and/or Swallow Maneuvers: Seated upright 90 degrees;Upright 30-60 min after meal;Head tilt left during swallow                Oral Care Recommendations: Oral care BID Follow up Recommendations: Skilled Nursing facility Plan: Continue with current plan of care       GO              Douglas BatonMary Beth Aleshia Cartelli, MS CF-SLP Speech-Language Pathologist   Douglas LindauMary E Bannon [name] 10/05/2016, 10:56 AM

## 2016-10-05 NOTE — Progress Notes (Signed)
Erick Colace, MD Physician Signed Physical Medicine and Rehabilitation  PMR Pre-admission Date of Service: 10/05/2016 3:10 PM  Related encounter: ED to Hosp-Admission (Current) from 09/26/2016 in [facility] 3S STEPDOWN       [] Hide copied text PMR Admission Coordinator Pre-Admission Assessment  Patient: Douglas [name] is an 75 y.o., male MRN: [id number] DOB: September 29, 1941 Height: 5\' 10"  (177.8 cm) Weight: 72.3 kg (159 lb 4.8 oz)                                                                                                                                                  Insurance Information HMO:     PPO:      PCP:      IPA:      80/20:      OTHER:  PRIMARY: Medicare A & B       Policy#: [id number]      Subscriber: Self CM Name:       Phone#:      Fax#:  Pre-Cert#: eligible via [employer], online portal      Employer: retired  Benefits:  Phone #:      Name:  Eff. Date: A 11/16/05 B 12/14/05     Deduct: $1,316      Out of Pocket Max: none      Life Max: unlimited CIR: 100%      SNF: 100% days 1-20; 80% days 21-100 Outpatient: 80%     Co-Pay: 20% Home Health: 100%      Co-Pay: none DME: 80%      Co-Pay: 20% Providers: patient's choice   SECONDARY: Development worker, community      Policy#: [id number]      Subscriber: Self CM Name:       Phone#:      Fax#:  Pre-Cert#:       Employer: retired  Benefits:  Phone #:      Name:  Eff. Date:      Deduct:       Out of Pocket Max:       Life Max:  CIR:       SNF:  Outpatient:      Co-Pay:  Home Health:       Co-Pay:  DME:      Co-Pay:   Medicaid Application Date:       Case Manager:  Disability Application Date:       Case Worker:   Emergency Contact Information        Contact Information    Name Relation Home Work Mobile   [employer] [phone]       Current Medical History  Patient Admitting Diagnosis: Left MCA infarct  History of Present Illness: Douglas [name] a 75 y.o.right handed  malewith history of diabetes mellitus, remote tobacco and alcohol use,hypertension, hyperlipidemia, chronic  kidney disease with creatinine 1.62. Per chart review patient lives with a roommate who works during the day. One level home with 3 steps to entry. Patient independent prior to admission. Presented 09/26/2016 with aphasia and severe right-sided weakness. CT of the brain shows suspect edema within the left insular cortex, anterior temporal lobe and inferior frontal lobe. No hemorrhage. Troponin mildly elevated 0.64. MRI showed left MCA infarct. Multiple other punctate foci of acute ischemia scattered throughout both cerebral and cerebellar hemispheres. MRA of the head and neck showed occlusion of left MCA at the level of the bifurcation. Patient did not receive TPA. Echocardiogram with ejection fraction of 55% no wall motion abnormalities. 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 Neurology consulted presently on aspirin and Plavixfor CVA prophylaxis.No plan for TEE or loop recorder. Recommendations are for a 30 day monitor.Subcutaneous Lovenox for DVT prophylaxis.Patient did receive a urology consult for difficult catheterization with follow-up per Dr. Rito Ehrlich placement of Foley tube until patient could attempt voiding trial. Patient was suspect aspiration pneumonia identified on chest x-ray and started on intravenous Unasyn. Blood cultures negative. He was transitioned to oral Augmentin. Presently maintained on a dysphagia 2 texture and nectar-thick liquid diet.Bouts of agitation and confusion placed on Depakote.Physical and occupational therapy evaluations completed with recommendations of physical medicine rehabilitation consult. Patient was admitted for comprehensive rehabilitation program 10/05/16.  NIH Total: 9    Past Medical History      Past Medical History:  Diagnosis Date  . CKD (chronic kidney disease)   . DM (diabetes mellitus) (HCC)   . Hyperlipidemia   .  Hypertension     Family History  family history includes Cancer in his father; Heart disease in his mother; Stroke (age of onset: 66) in his brother.  Prior Rehab/Hospitalizations:  Has the patient had major surgery during 100 days prior to admission? No  Current Medications   Current Facility-Administered Medications:  .  0.9 %  sodium chloride infusion, , Intravenous, Continuous, Starleen Arms, MD, Last Rate: 150 mL/hr at 10/05/16 1415, 150 mL/hr at 10/05/16 1415 .  acetaminophen (TYLENOL) tablet 650 mg, 650 mg, Oral, Q4H PRN **OR** [DISCONTINUED] acetaminophen (TYLENOL) solution 650 mg, 650 mg, Per Tube, Q4H PRN **OR** [DISCONTINUED] acetaminophen (TYLENOL) suppository 650 mg, 650 mg, Rectal, Q4H PRN, Jonah Blue, MD, 650 mg at 09/28/16 0424 .  amoxicillin-clavulanate (AUGMENTIN) 875-125 MG per tablet 1 tablet, 1 tablet, Oral, Q12H, Lynita Lombard [place], RPH, 1 tablet at 10/05/16 1139 .  [DISCONTINUED] aspirin suppository 300 mg, 300 mg, Rectal, Daily, 300 mg at 09/27/16 1206 **OR** aspirin tablet 325 mg, 325 mg, Oral, Daily, Jonah Blue, MD, 325 mg at 10/05/16 1140 .  atorvastatin (LIPITOR) tablet 80 mg, 80 mg, Oral, q1800, Elease Etienne, MD, 80 mg at 10/04/16 1823 .  clopidogrel (PLAVIX) tablet 75 mg, 75 mg, Oral, Daily, Marvel Plan, MD, 75 mg at 10/05/16 1141 .  enoxaparin (LOVENOX) injection 40 mg, 40 mg, Subcutaneous, Q24H, Marvel Plan, MD, 40 mg at 10/04/16 2001 .  insulin aspart (novoLOG) injection 0-15 Units, 0-15 Units, Subcutaneous, TID WC, Elease Etienne, MD, 8 Units at 10/05/16 1254 .  insulin aspart (novoLOG) injection 0-5 Units, 0-5 Units, Subcutaneous, QHS, Elease Etienne, MD, 2 Units at 10/04/16 2326 .  insulin glargine (LANTUS) injection 12 Units, 12 Units, Subcutaneous, Daily, Starleen Arms, MD, 12 Units at 10/05/16 1253 .  levothyroxine (SYNTHROID, LEVOTHROID) tablet 75 mcg, 75 mcg, Oral, QAC breakfast, Elease Etienne, MD, 75 mcg at 10/05/16  [id number] .  LORazepam (ATIVAN) injection 1 mg, 1 mg, Intravenous, Once, Leanne ChangKatherine P Schorr, NP .  metoprolol tartrate (LOPRESSOR) tablet 12.5 mg, 12.5 mg, Oral, BID, Elease EtienneAnand D Hongalgi, MD, 12.5 mg at 10/05/16 1141 .  RESOURCE THICKENUP CLEAR, , Oral, PRN, Elease EtienneAnand D Hongalgi, MD .  senna-docusate (Senokot-S) tablet 1 tablet, 1 tablet, Oral, QHS PRN, Jonah BlueJennifer Yates, MD .  Valproate Sodium (DEPAKENE) solution 250 mg, 250 mg, Oral, BID, Scarlett Prestoheresa D Egan, RPH, 250 mg at 10/05/16 1254  Patients Current Diet: DIET DYS 2 Room service appropriate? Yes; Fluid consistency: Nectar Thick  Precautions / Restrictions Precautions Precautions: Fall Precaution Comments: R sided inattention Restrictions Weight Bearing Restrictions: No   Has the patient had 2 or more falls or a fall with injury in the past year?No  Prior Activity Level Community (5-7x/wk): Prior to admission patient was retired and active in his community. He managed all advanved ADLs Independently.  He enjoyed meeting friends at [employer] as well as bowling.     Home Assistive Devices / Equipment Home Assistive Devices/Equipment: None  Prior Device Use: Indicate devices/aids used by the patient prior to current illness, exacerbation or injury? None of the above  Prior Functional Level Prior Function Level of Independence: Independent Comments: pt didn't use AD, was driving, cooking, bathing, dressing all independently.   Self Care: Did the patient need help bathing, dressing, using the toilet or eating? Independent  Indoor Mobility: Did the patient need assistance with walking from room to room (with or without device)? Independent  Stairs: Did the patient need assistance with internal or external stairs (with or without device)? Independent  Functional Cognition: Did the patient need help planning regular tasks such as shopping or remembering to take medications? Independent  Current Functional Level Cognition  Arousal/Alertness: Awake/alert Overall Cognitive Status: Difficult to assess Difficult to assess due to: Impaired communication Current Attention Level: Sustained Orientation Level: Oriented to person, Oriented to place, Disoriented to situation, Disoriented to time Following Commands: Follows one step commands with increased time, Follows one step commands inconsistently Safety/Judgement: Decreased awareness of safety, Decreased awareness of deficits General Comments: Patient alert with eyes open as PT entered room.  Able to follow commands at least 75% of the time.  Answered yes/no questions fairly appropriately.  Patient smiled x2 as PT joked with patient. Attention: Sustained Sustained Attention: Appears intact Problem Solving: Impaired Problem Solving Impairment: Functional basic    Extremity Assessment (includes Sensation/Coordination) Upper Extremity Assessment: RUE deficits/detail RUE Deficits / Details: Some spontaneous movement noted but nothing functional.  Pt with significant R inattn. RUE Coordination: decreased fine motor, decreased gross motor  Lower Extremity Assessment: Defer to PT evaluation RLE Deficits / Details: pt with decreased R LE voluntary mvmt but was able to stand without LE buckling. Anticipate pt to have coordination deficits. unable to complete MMT due to impaired cognition   ADLs Overall ADL's : Needs assistance/impaired Eating/Feeding: Total assistance Eating/Feeding Details (indicate cue type and reason): Pt very lethargic Grooming: Oral care, Wash/dry face, Maximal assistance, Sitting, Cueing for compensatory techniques Grooming Details (indicate cue type and reason): hand over hand assist given for washing face.  Pt put toothbrush in mouth but unable to prepare brush or actually brush teeth. Upper Body Bathing: Total assistance Lower Body Bathing: Total assistance Upper Body Dressing : Total assistance Lower Body Dressing: Total assistance Toilet  Transfer: +2 for physical assistance, Maximal assistance, Stand-pivot, BSC Toileting- Clothing Manipulation and Hygiene: Total assistance, +2 for physical assistance, Sit to/from stand Functional  mobility during ADLs: +2 for physical assistance, Maximal assistance General ADL Comments: total A for all ADLs as pt unable to maintain arousal    Mobility Overal bed mobility: Needs Assistance Bed Mobility: Supine to Sit, Sit to Supine Rolling: Min assist Sidelying to sit: Mod assist, +2 for physical assistance Supine to sit: Min assist, Mod assist Sit to supine: Min assist General bed mobility comments: pt in recliner   Transfers Overall transfer level: Needs assistance Equipment used: 2 person hand held assist Transfer via Lift Equipment: Stedy Transfers: Sit to/from Stand Sit to Stand: Mod assist, +2 physical assistance Stand pivot transfers: Min assist, +2 safety/equipment, +2 physical assistance General transfer comment: Utilized stedy for coming to stand x2.  Facilitation for more midline posture and balance.  A to position head in neutral spine, which also helped balance.  pt does use L UE to A with use of stedy, but moves LEs around on stedy.     Ambulation / Gait / Stairs / Wheelchair Mobility Ambulation/Gait Ambulation/Gait assistance: Mod assist, +2 physical assistance Ambulation Distance (Feet): 8 Feet Assistive device: 2 person hand held assist Gait Pattern/deviations: Step-to pattern, Drifts right/left, Narrow base of support, Ataxic General Gait Details: in room ambulation due to elevated HR and weakness   Posture / Balance Dynamic Sitting Balance Sitting balance - Comments: Patient requires bil UE support on bed to maintain balance.  No physical assist needed. Balance Overall balance assessment: Needs assistance Sitting-balance support: Single extremity supported, Feet supported Sitting balance-Leahy Scale: Poor Sitting balance - Comments: Patient requires bil UE support on  bed to maintain balance.  No physical assist needed. Postural control: Posterior lean, Right lateral lean Standing balance support: During functional activity Standing balance-Leahy Scale: Poor Standing balance comment: posterior lean at times   Special needs/care consideration BiPAP/CPAP: No CPM: No Continuous Drip IV: No Dialysis: No         Life Vest: No Oxygen:  Special Bed: No Trach Size: No Wound Vac (area): No      Skin: Bruising right arm and abrasions left foot                                Bowel mgmt: Incontinent last BM 09/28/16 Bladder mgmt: Incontinent with foley  Diabetic mgmt: Yes prior to admission     Previous Home Environment Living Arrangements: Non-relatives/Friends  Lives With: Friend(s) Available Help at Discharge: Family, Friend(s) Home Care Services: No Additional Comments: pt lived with a roomate who works during the day. 1 story home with 3-4 steps to enter.   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 2 nectar-thick liquids  Equipment Needs:  TBD Special Service Needs: None Additional Information: Reducing burden of care  to 1 person transfer, communication of basic wants and needs and family aware  Pt/Family Agrees to Admission and willing to participate: Yes Program Orientation Provided & Reviewed with Pt/Caregiver Including Roles  & Responsibilities: Yes Additional Information Needs: Nephew is POA only wishes info be shared with people who have the password "goldie" like on acute care  Information Needs to be Provided By: FYI for team    Decrease burdenLorain Childes of Care through IP rehab admission: Diet advancement, Decrease number of caregivers, Bowel and bladder program and Othercommunicaiton of basic needs and wants    Possible need for SNF placement upon discharge: Yes    Patient Condition: This patient's medical and functional status has changed since the consult dated: 09/27/16 in which the Rehabilitation Physician determined and documented that the patient's condition is appropriate for intensive rehabilitative care in an inpatient rehabilitation facility. See "History of Present Illness" (above) for medical update. Functional changes are: Mod +2 transfers with Steady to assist. Patient's medical and functional status update has been discussed with the Rehabilitation physician and patient remains appropriate for inpatient rehabilitation. Will admit to inpatient rehab today.   Preadmission Screen Completed By:  Fae PippinMelissa Leotta Weingarten, 10/05/2016 3:11 PM ______________________________________________________________________   Discussed status with Dr. Wynn BankerKirsteins on 10/05/16 at 1549 and received telephone approval for admission today.  Admission Coordinator:  Fae PippinMelissa Jayion Schneck, time 1549/Date 10/05/16       Revision History

## 2016-10-05 NOTE — Clinical Social Work Note (Signed)
Patient discharging to CIR today.   CSW signing off as social work intervention is no longer needed.  Carollee Nussbaumer, CSW [phone]  

## 2016-10-05 NOTE — Care Management Important Message (Signed)
Important Message  Patient Details  Name: Osborn CohoJames L Dunklee MRN: [id number] Date of Birth: 12/26/1940   Medicare Important Message Given:  Yes    Kyla BalzarineShealy, Malasha Kleppe Abena 10/05/2016, 10:53 AM

## 2016-10-05 NOTE — Progress Notes (Signed)
Report called to Debrareceiving RN on  4 west. VSS. Transferred to 4west 08 via bed with personal belongings. Family at bedside.  [facility] Jaxton Casale,RN

## 2016-10-05 NOTE — PMR Pre-admission (Signed)
PMR Admission Coordinator Pre-Admission Assessment  Patient: Douglas [name] is an 75 y.o., male MRN: [id number] DOB: 10/01/1941 Height: 5\' 10"  (177.8 cm) Weight: 72.3 kg (159 lb 4.8 oz)              Insurance Information HMO:     PPO:      PCP:      IPA:      80/20:      OTHER:  PRIMARY: Medicare A & B       Policy#: [id number]      Subscriber: Self CM Name:       Phone#:      Fax#:  Pre-Cert#: eligible via [employer], online portal      Employer: retired  Benefits:  Phone #:      Name:  Eff. 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 Date:      Deduct:       Out of Pocket Max:       Life Max:  CIR:       SNF:  Outpatient:      Co-Pay:  Home Health:       Co-Pay:  DME:      Co-Pay:   Medicaid Application Date:       Case Manager:  Disability Application Date:       Case Worker:   Emergency Contact Information Contact Information    Name Relation Home Work Mobile   Navarreandler,Stephen Friend [phone]       Current Medical History  Patient Admitting Diagnosis: Left MCA infarct  History of Present Illness: Douglas CornfieldJames L Aydeletteis a 75 y.o.right handed malewith history of diabetes mellitus, remote tobacco and alcohol use, hypertension, hyperlipidemia, chronic kidney disease with creatinine 1.62. Per chart review patient lives with a roommate who works during the day. One level home with 3 steps to entry. Patient independent prior to admission. Presented 09/26/2016 with aphasia and severe right-sided weakness. CT of the brain shows suspect edema within the left insular  cortex, anterior temporal lobe and inferior frontal lobe. No hemorrhage. Troponin mildly elevated 0.64. MRI showed left MCA infarct. Multiple other punctate foci of acute ischemia scattered throughout both cerebral and cerebellar hemispheres. MRA of the head and neck showed occlusion of left MCA at the level of the bifurcation. Patient did not receive TPA. Echocardiogram with ejection fraction of 55% no wall motion abnormalities. 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 Neurology consulted presently on aspirin and Plavix for CVA prophylaxis.No plan for TEE or loop recorder. Recommendations are for a 30 day monitor. Subcutaneous Lovenox for DVT prophylaxis. Patient did receive a urology consult for difficult catheterization with follow-up per Dr. Laverle PatterBorden and placement of Foley tube until patient could attempt voiding trial. Patient was suspect aspiration pneumonia identified on  chest x-ray and started on intravenous Unasyn. Blood cultures negative. He was transitioned to oral Augmentin. Presently maintained on a dysphagia 2 texture and nectar-thick liquid diet. Bouts of agitation and confusion placed on Depakote. Physical and occupational therapy evaluations completed with recommendations of physical medicine rehabilitation consult. Patient was admitted for comprehensive rehabilitation program 10/05/16.  NIH Total: 9    Past Medical History  Past Medical History:  Diagnosis Date  . CKD (chronic kidney disease)   . DM (diabetes mellitus) (HCC)   . Hyperlipidemia   . Hypertension     Family History  family history includes Cancer in his father; Heart disease in his mother; Stroke (age of onset: 2882) in his brother.  Prior Rehab/Hospitalizations:  Has the patient had major surgery during 100 days prior to admission? No  Current Medications   Current Facility-Administered Medications:  .  0.9 %  sodium chloride infusion, , Intravenous, Continuous, Starleen Armsawood S Elgergawy, MD, Last Rate: 150 mL/hr at  10/05/16 1415, 150 mL/hr at 10/05/16 1415 .  acetaminophen (TYLENOL) tablet 650 mg, 650 mg, Oral, Q4H PRN **OR** [DISCONTINUED] acetaminophen (TYLENOL) solution 650 mg, 650 mg, Per Tube, Q4H PRN **OR** [DISCONTINUED] acetaminophen (TYLENOL) suppository 650 mg, 650 mg, Rectal, Q4H PRN, Jonah BlueJennifer Yates, MD, 650 mg at 09/28/16 0424 .  amoxicillin-clavulanate (AUGMENTIN) 875-125 MG per tablet 1 tablet, 1 tablet, Oral, Q12H, Lynita LombardJennifer D [place], RPH, 1 tablet at 10/05/16 1139 .  [DISCONTINUED] aspirin suppository 300 mg, 300 mg, Rectal, Daily, 300 mg at 09/27/16 1206 **OR** aspirin tablet 325 mg, 325 mg, Oral, Daily, Jonah BlueJennifer Yates, MD, 325 mg at 10/05/16 1140 .  atorvastatin (LIPITOR) tablet 80 mg, 80 mg, Oral, q1800, Elease EtienneAnand D Hongalgi, MD, 80 mg at 10/04/16 1823 .  clopidogrel (PLAVIX) tablet 75 mg, 75 mg, Oral, Daily, Marvel PlanJindong Xu, MD, 75 mg at 10/05/16 1141 .  enoxaparin (LOVENOX) injection 40 mg, 40 mg, Subcutaneous, Q24H, Marvel PlanJindong Xu, MD, 40 mg at 10/04/16 2001 .  insulin aspart (novoLOG) injection 0-15 Units, 0-15 Units, Subcutaneous, TID WC, Elease EtienneAnand D Hongalgi, MD, 8 Units at 10/05/16 1254 .  insulin aspart (novoLOG) injection 0-5 Units, 0-5 Units, Subcutaneous, QHS, Elease EtienneAnand D Hongalgi, MD, 2 Units at 10/04/16 2326 .  insulin glargine (LANTUS) injection 12 Units, 12 Units, Subcutaneous, Daily, Starleen Armsawood S Elgergawy, MD, 12 Units at 10/05/16 1253 .  levothyroxine (SYNTHROID, LEVOTHROID) tablet 75 mcg, 75 mcg, Oral, QAC breakfast, Elease EtienneAnand D Hongalgi, MD, 75 mcg at 10/05/16 0858 .  LORazepam (ATIVAN) injection 1 mg, 1 mg, Intravenous, Once, Leanne ChangKatherine P Schorr, NP .  metoprolol tartrate (LOPRESSOR) tablet 12.5 mg, 12.5 mg, Oral, BID, Elease EtienneAnand D Hongalgi, MD, 12.5 mg at 10/05/16 1141 .  RESOURCE THICKENUP CLEAR, , Oral, PRN, Elease EtienneAnand D Hongalgi, MD .  senna-docusate (Senokot-S) tablet 1 tablet, 1 tablet, Oral, QHS PRN, Jonah BlueJennifer Yates, MD .  Valproate Sodium (DEPAKENE) solution 250 mg, 250 mg, Oral, BID, Scarlett Prestoheresa D Egan, RPH,  250 mg at 10/05/16 1254  Patients Current Diet: DIET DYS 2 Room service appropriate? Yes; Fluid consistency: Nectar Thick  Precautions / Restrictions Precautions Precautions: Fall Precaution Comments: R sided inattention Restrictions Weight Bearing Restrictions: No   Has the patient had 2 or more falls or a fall with injury in the past year?No  Prior Activity Level Community (5-7x/wk): Prior to admission patient was retired and active in his community. He managed all advanved ADLs Independently.  He enjoyed meeting friends at [employer] as well as bowling.     Home Assistive Devices / Equipment Home  Assistive Devices/Equipment: None  Prior Device Use: Indicate devices/aids used by the patient prior to current illness, exacerbation or injury? None of the above  Prior Functional Level Prior Function Level of Independence: Independent Comments: pt didn't use AD, was driving, cooking, bathing, dressing all independently.   Self Care: Did the patient need help bathing, dressing, using the toilet or eating? Independent  Indoor Mobility: Did the patient need assistance with walking from room to room (with or without device)? Independent  Stairs: Did the patient need assistance with internal or external stairs (with or without device)? Independent  Functional Cognition: Did the patient need help planning regular tasks such as shopping or remembering to take medications? Independent  Current Functional Level Cognition  Arousal/Alertness: Awake/alert Overall Cognitive Status: Difficult to assess Difficult to assess due to: Impaired communication Current Attention Level: Sustained Orientation Level: Oriented to person, Oriented to place, Disoriented to situation, Disoriented to time Following Commands: Follows one step commands with increased time, Follows one step commands inconsistently Safety/Judgement: Decreased awareness of safety, Decreased awareness of deficits General Comments:  Patient alert with eyes open as PT entered room.  Able to follow commands at least 75% of the time.  Answered yes/no questions fairly appropriately.  Patient smiled x2 as PT joked with patient. Attention: Sustained Sustained Attention: Appears intact Problem Solving: Impaired Problem Solving Impairment: Functional basic    Extremity Assessment (includes Sensation/Coordination)  Upper Extremity Assessment: RUE deficits/detail RUE Deficits / Details: Some spontaneous movement noted but nothing functional.  Pt with significant R inattn. RUE Coordination: decreased fine motor, decreased gross motor  Lower Extremity Assessment: Defer to PT evaluation RLE Deficits / Details: pt with decreased R LE voluntary mvmt but was able to stand without LE buckling. Anticipate pt to have coordination deficits. unable to complete MMT due to impaired cognition    ADLs  Overall ADL's : Needs assistance/impaired Eating/Feeding: Total assistance Eating/Feeding Details (indicate cue type and reason): Pt very lethargic Grooming: Oral care, Wash/dry face, Maximal assistance, Sitting, Cueing for compensatory techniques Grooming Details (indicate cue type and reason): hand over hand assist given for washing face.  Pt put toothbrush in mouth but unable to prepare brush or actually brush teeth. Upper Body Bathing: Total assistance Lower Body Bathing: Total assistance Upper Body Dressing : Total assistance Lower Body Dressing: Total assistance Toilet Transfer: +2 for physical assistance, Maximal assistance, Stand-pivot, BSC Toileting- Clothing Manipulation and Hygiene: Total assistance, +2 for physical assistance, Sit to/from stand Functional mobility during ADLs: +2 for physical assistance, Maximal assistance General ADL Comments: total A for all ADLs as pt unable to maintain arousal     Mobility  Overal bed mobility: Needs Assistance Bed Mobility: Supine to Sit, Sit to Supine Rolling: Min assist Sidelying to  sit: Mod assist, +2 for physical assistance Supine to sit: Min assist, Mod assist Sit to supine: Min assist General bed mobility comments: pt in recliner    Transfers  Overall transfer level: Needs assistance Equipment used: 2 person hand held assist Transfer via Lift Equipment: Stedy Transfers: Sit to/from Stand Sit to Stand: Mod assist, +2 physical assistance Stand pivot transfers: Min assist, +2 safety/equipment, +2 physical assistance General transfer comment: Utilized stedy for coming to stand x2.  Facilitation for more midline posture and balance.  A to position head in neutral spine, which also helped balance.  pt does use L UE to A with use of stedy, but moves LEs around on stedy.      Ambulation / Gait / Stairs /  Wheelchair Mobility  Ambulation/Gait Ambulation/Gait assistance: Mod assist, +2 physical assistance Ambulation Distance (Feet): 8 Feet Assistive device: 2 person hand held assist Gait Pattern/deviations: Step-to pattern, Drifts right/left, Narrow base of support, Ataxic General Gait Details: in room ambulation due to elevated HR and weakness    Posture / Balance Dynamic Sitting Balance Sitting balance - Comments: Patient requires bil UE support on bed to maintain balance.  No physical assist needed. Balance Overall balance assessment: Needs assistance Sitting-balance support: Single extremity supported, Feet supported Sitting balance-Leahy Scale: Poor Sitting balance - Comments: Patient requires bil UE support on bed to maintain balance.  No physical assist needed. Postural control: Posterior lean, Right lateral lean Standing balance support: During functional activity Standing balance-Leahy Scale: Poor Standing balance comment: posterior lean at times    Special needs/care consideration BiPAP/CPAP: No CPM: No Continuous Drip IV: No Dialysis: No         Life Vest: No Oxygen:  Special Bed: No Trach Size: No Wound Vac (area): No      Skin: Bruising right arm  and abrasions left foot                                Bowel mgmt: Incontinent last BM 09/28/16 Bladder mgmt: Incontinent with foley  Diabetic mgmt: Yes prior to admission      Previous Home Environment Living Arrangements: Non-relatives/Friends  Lives With: Friend(s) Available Help at Discharge: Family, Friend(s) Home Care Services: No Additional Comments: pt lived with a roomate who works during the day. 1 story home with 3-4 steps to enter.   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 2 nectar-thick liquids  Equipment Needs: TBD Special Service Needs: None Additional Information: Reducing burden of care to 1 person transfer, communication of basic wants and needs and family aware  Pt/Family Agrees to Admission and willing to participate: Yes Program Orientation Provided & Reviewed with Pt/Caregiver Including Roles  & Responsibilities: Yes Additional Information Needs: Nephew is POA only wishes info be shared with people who have the password "[email or web address]" like on acute care  Information Needs  to be Provided By: Lorain Childes for team    Decrease burden of Care through IP rehab admission: Diet advancement, Decrease number of caregivers, Bowel and bladder program and Othercommunicaiton of basic needs and wants    Possible need for SNF placement upon discharge: Yes    Patient Condition: This patient's medical and functional status has changed since the consult dated: 09/27/16 in which the Rehabilitation Physician determined and documented that the patient's condition is appropriate for intensive rehabilitative care in an inpatient rehabilitation facility. See "History of Present Illness" (above) for medical update. Functional changes are: Mod +2 transfers with Steady to assist.  Patient's medical and functional status update has been discussed with the Rehabilitation physician and patient remains appropriate for inpatient rehabilitation. Will admit to inpatient rehab today.   Preadmission Screen Completed By:  Fae Pippin, 10/05/2016 3:11 PM ______________________________________________________________________   Discussed status with Dr. Wynn Banker on 10/05/16 at 1549 and received telephone approval for admission today.  Admission Coordinator:  Fae Pippin, time 1549/Date 10/05/16

## 2016-10-05 NOTE — Progress Notes (Signed)
Inpatient Rehabilitation  Received medical clearance to admit patient to IP Rehab and family in agreement with plan.  RNCM and CSW aware.  Please call with questions.    Charlane FerrettiMelissa Phuc Kluttz, M.A., CCC/SLP Admission Coordinator  [facility]  Cell [phone]

## 2016-10-05 NOTE — Discharge Summary (Signed)
Douglas [name], is a 75 y.o. male  DOB 09-May-1941  MRN [id number].  Admission date:  09/26/2016  Admitting Physician  Karmen Bongo, MD  Discharge Date:  10/05/2016   Primary MD  Marjorie Smolder, MD  Recommendations for primary care physician for things to follow:  - please  check CBC, BMP in 3 days - patient  on dysphagia to nectar thick diet, encourage oral intake, to continue SLP - Patient will need 30 days heart monitor on discharge, to be arranged by [facility] - Voiding trial in 3 days - Adjust Lantus dose as needed for hyperglycemia  CODE STATUS: DO NOT RESUSCITATE  Admission Diagnosis  Stroke (cerebrum) ([place]) [X41.2] Acute embolic stroke ([place]) [I78.6]   Discharge Diagnosis  Stroke (cerebrum) ([place]) [V67.2] Acute embolic stroke ([place]) [C94.7]    Principal Problem:   CVA (cerebral vascular accident) ([place]) Active Problems:   Hyperlipidemia   Essential hypertension   DM (diabetes mellitus) ([place])   CKD (chronic kidney disease)   Elevated troponin   Anemia   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 CKD (chronic kidney disease)   . DM (diabetes mellitus) ([place])   . Hyperlipidemia   . Hypertension     History reviewed. No pertinent surgical history.     History of present illness and  Hospital Course:     Kindly see H&P for history of present illness and admission details, please review complete Labs, Consult reports and Test reports for all details in brief  HPI  from the history and physical done on the day of admission 09/26/2016 HPI: Douglas [name] is a 75 y.o. male with medical history signifi/cant of HTN, [name], DM, [name] presenting with a stroke.  History is quite limited because the patient has a marked expressive aphasia.  It is difficult to tell if he has  recepti/ve speech difficulties as well.  The history is provided by his niece, who was not present and received the information from the patient's roommate.  Patient met a friend at lunch about 1230, drove himself home after.  After 5pm, roommate found him in the floor in the living room.  Patient was awake and alert but unable to speak.  No incontinence.  Speech has improved a little but it is still hard to understand him.  Denies n/w/t of arms or legs.   ED Course:  Per Dr. Roderic Palau: The patient returned from CT scan he was able to speak but was confused. He had slurred speech and right facial drooping his right arm right leg are much stronger almost back to normal. He was able to answer yes or no to questions and follow commands additional history that was learned later was patient was normal at 12:30 PM today and was found at 5 PM and arrived at the emergency department at 5:30 PM CT scan suggested left MCA stroke and  MRI was recommended. Neurology was consulted and patient will be admitted to stepdown by medicine   Hospital Course  75 year old male with PMH of HTN, [name], DM, CKD who presented to [facility] ED on 09/26/16 after his roommate found him in the floor of the living room and with speech difficulties. Last known well 09/26/16  and 12:30 PM. Workup confirmed bilateral embolic looking strokes and elevated troponin. Neurology and cardiology consulted.   Acute bilateral strokes:Embolic shower largest at left  MCA, but also include scattered bilateral cerebral and cerebellar, embolic secondary to unknown source -  Left MCA, as well as scattered bilateral cerebral and cerebellar acute and subacute infarcts.  - Etiology: embolic of unknown source.  - Resultant receptive and expressive aphasia, facial asymmetry and right upper extremity weakness.  - MRI brain 09/26/16: Left MCA infarct. Multiple foci of acute ischemia scattered throughout both cerebral and cerebellar hemispheres. No hemorrhage or mass  effect. MRA head: Occlusion of left MCA at the level of the bifurcation. 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 2-D echo: EF 50-55 percent, no source of embolus.. Carotid Dopplers: Not requested (had MRA neck). LDL 120. A1c: 7.2.  - Was on aspirin 81 MG daily prior to admission, now on 325 MG daily for secondary stroke prophylaxis. Due to intracranial stenosis, recommend DAPT for 3 months and then plavix alone. - Overall much improved compared to 12/15. As per neurology follow-up, initial TEE was aborted given respiratory distress, and by cardiology, not a candidate for loop recorder group record as well there was a cystic concern about repeat TEE , so plan has changed  for 30 days cardiac event monitor.   Sinus tachycardia  - On telemetry patient heartrate: In the low 100s, and this is mainly related to decreased fluid intake given he is on nectar thick liquid, he had echo this admission with preserved EF, no wall motion abnormalities, recommendation is to encourage oral intake, at this point no further workup is indicated  Acute encephalopathy: -  Multifactorial including acute worsening strokes, aspiration pneumonia and baseline mental status not known. Mental status improving  Apnea/respiratory arrest 12/15/aspiration pneumonia/right perihilar opacity on chest x-ray:  - When patient was in endoscopy for TEE, he became somnolent, responsive only to painful stimuli, blood sugar 180, hypoxia and had periods of apnea. Subsequently came around and was more alert, hypoxia resolved. Improved. Started on IV Unasyn 12/15. Blood cultures 2 from 12/13: Negative to date. Recommend follow-up chest x-ray in 3-4 weeks. Treated for aspiration pneumonia, to finish oral Augmentin tomorrow.  Acute urinary retention:  - Urology was consulted and underwent Foley catheter placement 12/13. Reassess in a few days with voiding trial when he is close to discharge. Patient partially pulled out  Foley catheter on 12/15 and had to be replaced. Hematuria secondary to trauma has almost resolved. Lead voiding trial in 3 days  Type II DM/IDDM with Recurrent hypoglycemia:  -. A1c 7.2. Continue on Lantus, dose was increased from 10-12 units today giving a control CBG, continue with insulin sliding scale  Elevated troponin:  - No chest pain reported. Patient found down at home.? Related to acute stroke and renal insufficiency. 2-D echo results as below. Cardiology consultation appreciated and feels that elevated troponin is due to acute strokes. Outpatient stress test.  Essential hypertension: -   blood pressure acceptable on metoprolol  Hyperlipidemia:  - LDL 120. Continue statins   Possible stage III chronic kidney disease: -  Baseline creatinine not known. Trend  creatinine. Given acute urinary retention, check renal ultrasound to rule out obstruction. Renal ultrasound without hydronephrosis. Baseline creatinine probably in the 1.2-1.3 range. Stable.  Anemia:  - Baseline hemoglobin not known. Presented with hemoglobin of 10.2. May be chronic. Consider outpatient workup. No overt bleeding except traumatic hematuria. Follow CBC and consider transfusion if hemoglobin <7 g per DL. Hemoglobin has gradually declined to 8.1.  Hypothyroid: -  Resumed home dose of Synthroid. As per pharmacist's home medication reconciliation, it is not known as to which medications and when he was taking it prior to admission.  Dysphagia:  - Secondary to acute stroke. Currently on dysphagia 2 with nectar thick, to be followed by SLP at [facility]     Discharge Condition:  stable  Follow UP  Follow-up Information    Xu,Jindong, MD. Schedule an appointment as soon as possible for a visit in 6 week(s).   Specialty:  Neurology Contact information: [address] [place] [address] [phone]             Discharge Instructions  and  Discharge Medications     Discharge  Instructions    Ambulatory referral to Neurology    Complete by:  As directed    Pt will follow up with Dr. Erlinda Hong at [facility] in about 2 months. Thanks.   Discharge instructions    Complete by:  As directed    Follow with Primary MD Marjorie Smolder, MD After discharge from [facility], CMP, 2 view Chest X ray checked  by Primary MD next visit.    Activity: As tolerated with Full fall precautions use walker/cane & assistance as needed   Disposition CIR   Diet: Dysphagia 2 with nectar thick  , with feeding assistance and aspiration precautions.  On your next visit with your primary care physician please Get Medicines reviewed and adjusted.   Please request your Prim.MD to go over all Hospital Tests and Procedure/Radiological results at the follow up, please get all Hospital records sent to your Prim MD by signing hospital release before you go home.   If you experience worsening of your admission symptoms, develop shortness of breath, life threatening emergency, suicidal or homicidal thoughts you must seek medical attention immediately by calling 911 or calling your MD immediately  if symptoms less severe.  You Must read complete instructions/literature along with all the possible adverse reactions/side effects for all the Medicines you take and that have been prescribed to you. Take any new Medicines after you have completely understood and accpet all the possible adverse reactions/side effects.   Do not drive, operating heavy machinery, perform activities at heights, swimming or participation in water activities or provide baby sitting services if your were admitted for syncope or siezures until you have seen by Primary MD or a Neurologist and advised to do so again.  Do not drive when taking Pain medications.    Do not take more than prescribed Pain, Sleep and Anxiety Medications  Special Instructions: If you have smoked or chewed Tobacco  in the last 2 yrs please stop smoking, stop  any regular Alcohol  and or any Recreational drug use.  Wear Seat belts while driving.   Please note  You were cared for by a hospitalist during your hospital stay. If you have any questions about your discharge medications or the care you received while you were in the hospital after you are discharged, you can call the unit and asked to speak with the hospitalist on  call if the hospitalist that took care of you is not available. Once you are discharged, your primary care physician will handle any further medical issues. Please note that NO REFILLS for any discharge medications will be authorized once you are discharged, as it is imperative that you return to your primary care physician (or establish a relationship with a primary care physician if you do not have one) for your aftercare needs so that they can reassess your need for medications and monitor your lab values.   Increase activity slowly    Complete by:  As directed      Allergies as of 10/05/2016   No Known Allergies     Medication List    STOP taking these medications   BASAGLAR KWIKPEN 100 UNIT/ML Sopn Replaced by:  insulin glargine 100 UNIT/ML injection   lisinopril 20 MG tablet Commonly known as:  PRINIVIL,ZESTRIL   TRADJENTA 5 MG Tabs tablet Generic drug:  linagliptin     TAKE these medications   acetaminophen 325 MG tablet Commonly known as:  TYLENOL Take 2 tablets (650 mg total) by mouth every 4 (four) hours as needed for mild pain (or temp > 37.5 C (99.5 F)).   amoxicillin-clavulanate 875-125 MG tablet Commonly known as:  AUGMENTIN Take 1 tablet by mouth every 12 (twelve) hours. Continue through 12/22, and then stop   aspirin 325 MG tablet Take 1 tablet (325 mg total) by mouth daily. Start taking on:  10/06/2016 What changed:  medication strength  how much to take   atorvastatin 80 MG tablet Commonly known as:  LIPITOR Take 80 mg by mouth daily.   cholecalciferol 1000 units tablet Commonly  known as:  VITAMIN D Take 1,000 Units by mouth daily.   clopidogrel 75 MG tablet Commonly known as:  PLAVIX Take 1 tablet (75 mg total) by mouth daily. Start taking on:  10/06/2016   ferrous sulfate 325 (65 FE) MG tablet Take 325 mg by mouth daily with breakfast.   insulin aspart 100 UNIT/ML injection Commonly known as:  novoLOG Inject 0-15 Units into the skin 3 (three) times daily with meals.   insulin aspart 100 UNIT/ML injection Commonly known as:  novoLOG Inject 0-5 Units into the skin at bedtime.   insulin glargine 100 UNIT/ML injection Commonly known as:  LANTUS Inject 0.12 mLs (12 Units total) into the skin daily. Start taking on:  10/06/2016 Replaces:  BASAGLAR KWIKPEN 100 UNIT/ML Sopn   levothyroxine 75 MCG tablet Commonly known as:  SYNTHROID, LEVOTHROID Take 75 mcg by mouth daily before breakfast.   metoprolol tartrate 25 MG tablet Commonly known as:  LOPRESSOR Take 0.5 tablets (12.5 mg total) by mouth 2 (two) times daily.   senna-docusate 8.6-50 MG tablet Commonly known as:  Senokot-S Take 1 tablet by mouth at bedtime as needed for mild constipation.   Valproate Sodium 250 MG/5ML Soln solution Commonly known as:  DEPAKENE Take 5 mLs (250 mg total) by mouth 2 (two) times daily.         Diet and Activity recommendation: See Discharge Instructions above   Consults obtained -   Neurology  Cardiology  Urology   Major procedures and Radiology Reports - PLEASE review detailed and final reports for all details, in brief -     Dg Chest 2 View  Result Date: 09/26/2016 CLINICAL DATA:  Altered mental status. EXAM: CHEST  2 VIEW COMPARISON:  None. FINDINGS: Mild cardiomegaly is noted. No pneumothorax or pleural effusion is noted. Left lung is clear.  Ill-defined opacity is seen in the right lung concerning for possible pneumonia or edema. Bony thorax is unremarkable. IMPRESSION: Ill-defined opacity seen in right lung concerning for possible pneumonia or  edema. Electronically Signed   By: Marijo Conception, M.D.   On: 09/26/2016 19:08   Ct Head Wo Contrast  Result Date: 09/29/2016 CLINICAL DATA:  Reason CVA.  Intermittent apnea EXAM: CT HEAD WITHOUT CONTRAST TECHNIQUE: Contiguous axial images were obtained from the base of the skull through the vertex without intravenous contrast. COMPARISON:  Brain MRI September 26, 2016 FINDINGS: Brain: There is mild diffuse atrophy. There has been evolution of infarct in the left middle cerebral artery distribution with cytotoxic edema in the posterior left fall and much of the left superior temporal lobe regions. There is no appreciable hemorrhage or midline shift. No mass seen. There is no subdural or epidural fluid collections. There is also an infarct in the posteromedial right cerebellum which appears larger than on recent MR. There is no appreciable mass effect from this fairly small but present infarct. Several tiny lacunar type infarcts in the posterior circulation are not evident by CT. Elsewhere, there is patchy small vessel disease in the centra semiovale bilaterally. No new infarct is evident compared to 3 days prior. Vascular: Increased attenuation is noted in the periphery of the left middle cerebral artery consistent with infarct in the left middle cerebral artery distribution, likely a small focus of thrombus in the distal left middle cerebral artery. No hyperdense vessel. There are foci of calcification in each carotid siphon. Skull: The bony calvarium appears intact. Sinuses/Orbits: Paranasal sinuses are clear. Orbits appear symmetric bilaterally. Other: There is opacification in several inferior mastoid air cells on the left. Mastoids elsewhere clear. 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 IMPRESSION: Evolution of left middle cerebral artery distribution infarct in the left posterior frontal and superior left temporal regions with  increase in cytotoxic edema in this area. There is localized mass effect from the cytotoxic edema but no midline shift. There is a small infarct in the right cerebellum medially and posteriorly showing evolution from recent study. No hemorrhage. Note that there is hyperdensity in the distal left middle cerebral artery, likely thrombus in this region. There is mild atrophy with mild periventricular small vessel disease. There is inferior left mastoid air cell disease. There is advanced arthropathy in both temporomandibular joints. Electronically Signed   By: Lowella Grip III M.D.   On: 09/29/2016 10:02   Ct Head Wo Contrast  Result Date: 09/26/2016 CLINICAL DATA:  Found down on floor today.  Aphasia EXAM: CT HEAD WITHOUT CONTRAST TECHNIQUE: Contiguous axial images were obtained from the base of the skull through the vertex without intravenous contrast. COMPARISON:  None. FINDINGS: Brain: There is no acute hemorrhage. No focal mass lesion. Suspected loss of the gray-white matter differentiation involving the left insula, inferior frontal, and anterior temporal lobes. Moderate atrophy. Ventricles slightly enlarged but felt secondary to atrophy. Vascular: Carotid artery calcifications. Questionable mildly dense distal left MCA. Skull: Mastoid air cells are clear. No fracture. Prominent mandibular condyles with degenerative changes. Sinuses/Orbits: No acute fluid levels in the sinuses. Mild mucosal thickening in the ethmoid sinuses. No acute orbital abnormality. Other: None IMPRESSION: 1. Suspect edema within the left insular cortex, anterior temporal lobe and left inferior frontal lobe. MRI is recommended for further evaluation. Possible mildly dense distal left MCA artery. 2. No hemorrhage. 3. Atrophy Electronically Signed  By: Donavan Foil M.D.   On: 09/26/2016 21:04   Mr Angiogram Head Wo Contrast  Result Date: 09/27/2016 CLINICAL DATA:  Right-sided paralysis and aphasia. EXAM: MRI HEAD WITHOUT AND  WITH CONTRAST MRA HEAD WITHOUT CONTRAST MRA NECK WITHOUT AND WITH CONTRAST TECHNIQUE: Multiplanar, multiecho pulse sequences of the brain and surrounding structures were obtained without and with intravenous contrast. Angiographic images of the Circle of Willis were obtained using MRA technique without intravenous contrast. Angiographic images of the neck were obtained using MRA technique without and with intravenous contrast. Carotid stenosis measurements (when applicable) are obtained utilizing NASCET criteria, using the distal internal carotid diameter as the denominator. CONTRAST:  15 mL MultiHance IV COMPARISON:  Head CT 09/26/2016 FINDINGS: MRI HEAD FINDINGS Brain: The midline structures are normal. There is extensive diffusion restriction in the left insular cortex and frontal operculum. There are additional small foci of diffusion restriction scattered throughout both cerebral hemispheres and both sides of the cerebellum. There is no midline shift or significant mass effect. No evidence of acute hemorrhage. There is multifocal hyperintense T2-weighted signal within the periventricular white matter, most often seen in the setting of chronic microvascular ischemia. No hydrocephalus or extra-axial fluid collection. No lobar predominant atrophy pattern. There are areas of contrast enhancement within the cerebellum. Within the peripheral aspect of the left hemisphere, in the area of peripheral contrast enhancement measures 1.9 x 1.1 cm. A smaller focus near the cerebellar midline measures 4 mm. There is an additional focus of contrast enhancement along the right precentral gyrus. Vascular: Major intracranial arterial and venous sinus flow voids are preserved. Area of hemosiderin deposition in the left cerebellar hemisphere. There is focal susceptibility in the location of the left MCA bifurcation, which corresponds to the occlusion demonstrated on time-of-flight images. Skull and upper cervical spine: The  visualized skull base, calvarium, upper cervical spine and extracranial soft tissues are normal. Sinuses/Orbits: No fluid levels or advanced mucosal thickening. No mastoid effusion. Normal orbits. MRA HEAD FINDINGS Intracranial internal carotid arteries: Normal. Anterior cerebral arteries: Normal. Middle cerebral arteries: There is occlusion of the left middle cerebral artery at the site of the bifurcation. There is minimal flow related enhancement seen within the distal left MCA distribution. The right MCA is normal. Posterior communicating arteries: Present bilaterally. Posterior cerebral arteries: Normal. Basilar artery: Normal. Vertebral arteries: Codominant. Normal. Superior cerebellar arteries: Normal. Anterior inferior cerebellar arteries: Normal. Posterior inferior cerebellar arteries: Normal. MRA NECK FINDINGS There is a normal 3 vessel aortic arch branch pattern. The bilateral subclavian arteries are normal. There is approximately 30% stenosis at the proximal left internal carotid artery. No right carotid stenosis. The cervical courses of both vertebral arteries are normal. IMPRESSION: 1. 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 Multiple other punctate foci of acute ischemia scattered throughout both cerebral and cerebellar hemispheres suggest a central embolic process. No hemorrhage or mass effect. 2. 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 3. Multiple foci of contrast enhancement within the cerebellum and right frontal lobe, with the largest focus measuring up to 2 cm. Given the other findings, these findings may be secondary to subacute ischemia. However, follow-up imaging (MRI brain with and without contrast) is recommended in 4-6 weeks to exclude the possibility of a neoplastic process, primarily metastatic disease. 4. 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 Otherwise normal MRA of the neck. Critical Value/emergent  results were called by telephone at the time of interpretation on 09/27/2016  at 12:08 am to Antonietta Breach, PA who verbally acknowledged these results. Electronically Signed   By: Ulyses Jarred M.D.   On: 09/27/2016 00:18   Mr Angiogram Neck W Or Wo Contrast  Result Date: 09/27/2016 CLINICAL DATA:  Right-sided paralysis and aphasia. EXAM: MRI HEAD WITHOUT AND WITH CONTRAST MRA HEAD WITHOUT CONTRAST MRA NECK WITHOUT AND WITH CONTRAST TECHNIQUE: Multiplanar, multiecho pulse sequences of the brain and surrounding structures were obtained without and with intravenous contrast. Angiographic images of the Circle of Willis were obtained using MRA technique without intravenous contrast. Angiographic images of the neck were obtained using MRA technique without and with intravenous contrast. Carotid stenosis measurements (when applicable) are obtained utilizing NASCET criteria, using the distal internal carotid diameter as the denominator. CONTRAST:  15 mL MultiHance IV COMPARISON:  Head CT 09/26/2016 FINDINGS: MRI HEAD FINDINGS Brain: The midline structures are normal. There is extensive diffusion restriction in the left insular cortex and frontal operculum. There are additional small foci of diffusion restriction scattered throughout both cerebral hemispheres and both sides of the cerebellum. There is no midline shift or significant mass effect. No evidence of acute hemorrhage. There is multifocal hyperintense T2-weighted signal within the periventricular white matter, most often seen in the setting of chronic microvascular ischemia. No hydrocephalus or extra-axial fluid collection. No lobar predominant atrophy pattern. There are areas of contrast enhancement within the cerebellum. Within the peripheral aspect of the left hemisphere, in the area of peripheral contrast enhancement measures 1.9 x 1.1 cm. A smaller focus near the cerebellar midline measures 4 mm. There is an additional focus of contrast enhancement along the  right precentral gyrus. Vascular: Major intracranial arterial and venous sinus flow voids are preserved. Area of hemosiderin deposition in the left cerebellar hemisphere. There is focal susceptibility in the location of the left MCA bifurcation, which corresponds to the occlusion demonstrated on time-of-flight images. Skull and upper cervical spine: The visualized skull base, calvarium, upper cervical spine and extracranial soft tissues are normal. Sinuses/Orbits: No fluid levels or advanced mucosal thickening. No mastoid effusion. Normal orbits. MRA HEAD FINDINGS Intracranial internal carotid arteries: Normal. Anterior cerebral arteries: Normal. Middle cerebral arteries: There is occlusion of the left middle cerebral artery at the site of the bifurcation. There is minimal flow related enhancement seen within the distal left MCA distribution. The right MCA is normal. Posterior communicating arteries: Present bilaterally. Posterior cerebral arteries: Normal. Basilar artery: Normal. Vertebral arteries: Codominant. Normal. Superior cerebellar arteries: Normal. Anterior inferior cerebellar arteries: Normal. Posterior inferior cerebellar arteries: Normal. MRA NECK FINDINGS There is a normal 3 vessel aortic arch branch pattern. The bilateral subclavian arteries are normal. There is approximately 30% stenosis at the proximal left internal carotid artery. No right carotid stenosis. The cervical courses of both vertebral arteries are normal. IMPRESSION: 1. 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 Multiple other punctate foci of acute ischemia scattered throughout both cerebral and cerebellar hemispheres suggest a central embolic process. No hemorrhage or mass effect. 2. 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 3. Multiple foci of contrast enhancement within the cerebellum and right frontal lobe, with the largest focus measuring up to 2 cm. Given the other findings, these  findings may be secondary to subacute ischemia. However, follow-up imaging (MRI brain with and without contrast) is recommended in 4-6 weeks to exclude the possibility of a neoplastic process, primarily metastatic disease. 4. 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 Otherwise normal MRA  of the neck. Critical Value/emergent results were called by telephone at the time of interpretation on 09/27/2016 at 12:08 am to [facility] [place] who verbally acknowledged these results. Electronically Signed   By: Ulyses Jarred M.D.   On: 09/27/2016 00:18   Mr Jeri Cos FG Contrast  Result Date: 09/27/2016 CLINICAL DATA:  Right-sided paralysis and aphasia. EXAM: MRI HEAD WITHOUT AND WITH CONTRAST MRA HEAD WITHOUT CONTRAST MRA NECK WITHOUT AND WITH CONTRAST TECHNIQUE: Multiplanar, multiecho pulse sequences of the brain and surrounding structures were obtained without and with intravenous contrast. Angiographic images of the Circle of Willis were obtained using MRA technique without intravenous contrast. Angiographic images of the neck were obtained using MRA technique without and with intravenous contrast. Carotid stenosis measurements (when applicable) are obtained utilizing NASCET criteria, using the distal internal carotid diameter as the denominator. CONTRAST:  15 mL MultiHance IV COMPARISON:  Head CT 09/26/2016 FINDINGS: MRI HEAD FINDINGS Brain: The midline structures are normal. There is extensive diffusion restriction in the left insular cortex and frontal operculum. There are additional small foci of diffusion restriction scattered throughout both cerebral hemispheres and both sides of the cerebellum. There is no midline shift or significant mass effect. No evidence of acute hemorrhage. There is multifocal hyperintense T2-weighted signal within the periventricular white matter, most often seen in the setting of chronic microvascular ischemia. No hydrocephalus or extra-axial fluid collection.  No lobar predominant atrophy pattern. There are areas of contrast enhancement within the cerebellum. Within the peripheral aspect of the left hemisphere, in the area of peripheral contrast enhancement measures 1.9 x 1.1 cm. A smaller focus near the cerebellar midline measures 4 mm. There is an additional focus of contrast enhancement along the right precentral gyrus. Vascular: Major intracranial arterial and venous sinus flow voids are preserved. Area of hemosiderin deposition in the left cerebellar hemisphere. There is focal susceptibility in the location of the left MCA bifurcation, which corresponds to the occlusion demonstrated on time-of-flight images. Skull and upper cervical spine: The visualized skull base, calvarium, upper cervical spine and extracranial soft tissues are normal. Sinuses/Orbits: No fluid levels or advanced mucosal thickening. No mastoid effusion. Normal orbits. MRA HEAD FINDINGS Intracranial internal carotid arteries: Normal. Anterior cerebral arteries: Normal. Middle cerebral arteries: There is occlusion of the left middle cerebral artery at the site of the bifurcation. There is minimal flow related enhancement seen within the distal left MCA distribution. The right MCA is normal. Posterior communicating arteries: Present bilaterally. Posterior cerebral arteries: Normal. Basilar artery: Normal. Vertebral arteries: Codominant. Normal. Superior cerebellar arteries: Normal. Anterior inferior cerebellar arteries: Normal. Posterior inferior cerebellar arteries: Normal. MRA NECK FINDINGS There is a normal 3 vessel aortic arch branch pattern. The bilateral subclavian arteries are normal. There is approximately 30% stenosis at the proximal left internal carotid artery. No right carotid stenosis. The cervical courses of both vertebral arteries are normal. IMPRESSION: 1. 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 Multiple other punctate foci of acute ischemia scattered  throughout both cerebral and cerebellar hemispheres suggest a central embolic process. No hemorrhage or mass effect. 2. 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 3. Multiple foci of contrast enhancement within the cerebellum and right frontal lobe, with the largest focus measuring up to 2 cm. Given the other findings, these findings may be secondary to subacute ischemia. However, follow-up imaging (MRI brain with and without contrast) is recommended in 4-6 weeks to exclude the possibility of a neoplastic process, primarily metastatic  disease. 4. 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 Otherwise normal MRA of the neck. Critical Value/emergent results were called by telephone at the time of interpretation on 09/27/2016 at 12:08 am to [facility] [place] who verbally acknowledged these results. Electronically Signed   By: Ulyses Jarred M.D.   On: 09/27/2016 00:18   US Renal  Result Date: 09/27/2016 CLINICAL DATA:  Urinary retention, hypertension, diabetes mellitus, chronic kidney disease EXAM: RENAL / URINARY TRACT ULTRASOUND COMPLETE COMPARISON:  Ultrasound abdomen 10/08/2015 FINDINGS: Right Kidney: Length: 5.1 cm. Cortical thinning. Upper normal cortical echogenicity. Small probable cyst at upper pole RIGHT kidney 1.6 x 1.8 x 2.0 cm, measured 2.7 x 1.8 x 2.0 cm on 10/08/2015. No additional mass, hydronephrosis or shadowing calcification. Left Kidney: Length: By 0.7 cm. Diffuse cortical thinning. Normal cortical echogenicity. No mass, hydronephrosis or shadowing calcification. Bladder: Decompressed by Foley catheter, unable to evaluate. IMPRESSION: Interval decrease in size of a small cyst at the upper pole RIGHT kidney. Age-related cortical atrophy without evidence of additional renal mass or hydronephrosis. Electronically Signed   By: Lavonia Dana M.D.   On: 09/27/2016 16:55   Dg Chest Port 1 View  Result Date: 09/29/2016 CLINICAL DATA:  Transesophageal  echocardiogram, decreased responsiveness, apnea, desaturation, code stroke. EXAM: PORTABLE CHEST 1 VIEW COMPARISON:  09/26/2016. FINDINGS: Trachea is midline. Heart size stable. Mild patchy airspace opacification in the right perihilar region. Lungs are low in volume. Minimal bibasilar atelectasis. No pleural fluid. Left apical pleural thickening. IMPRESSION: Minimal right perihilar airspace opacification may be due to pneumonia. Her Followup PA and lateral chest X-ray is recommended in 3-4 weeks following trial of antibiotic therapy to ensure resolution and exclude underlying malignancy. Electronically Signed   By: Lorin Picket M.D.   On: 09/29/2016 12:26    Micro Results    Recent Results (from the past 240 hour(s))  Culture, blood (routine x 2)     Status: None   Collection Time: 09/27/16  3:20 AM  Result Value Ref Range Status   Specimen Description BLOOD RIGHT ANTECUBITAL  Final   Special Requests BOTTLES DRAWN AEROBIC AND ANAEROBIC 10CC  Final   Culture NO GROWTH 5 DAYS  Final   Report Status 10/02/2016 FINAL  Final  Culture, blood (routine x 2)     Status: None   Collection Time: 09/27/16  3:28 AM  Result Value Ref Range Status   Specimen Description BLOOD RIGHT FOREARM  Final   Special Requests BOTTLES DRAWN AEROBIC ONLY 5CC  Final   Culture NO GROWTH 5 DAYS  Final   Report Status 10/02/2016 FINAL  Final  MRSA PCR Screening     Status: None   Collection Time: 09/27/16  3:40 AM  Result Value Ref Range Status   MRSA by PCR NEGATIVE NEGATIVE Final    Comment:        The GeneXpert MRSA Assay (FDA approved for NASAL specimens only), is one component of a comprehensive MRSA colonization surveillance program. It is not intended to diagnose MRSA infection nor to guide or monitor treatment for MRSA infections.        Today   Subjective:   Douglas [name] As per RN, tolerating diet well, has been alert and follow simple instructions, no agitation,no  hematuria , no acute  issues.  Objective:   Blood pressure 133/64, pulse (!) 117, temperature 98.5 F (36.9 C), temperature source Oral, resp. rate (!) 22, height '5\' 10"'$  (1.778 m), weight 72.3 kg (159 lb 4.8  oz), SpO2 96 %.   Intake/Output Summary (Last 24 hours) at 10/05/16 1419 Last data filed at 10/05/16 1200  Gross per 24 hour  Intake              240 ml  Output              875 ml  Net             -635 ml    Exam General exam: Pleasant elderly male lying comfortably in bed.  Respiratory system: Slightly diminished breath sounds in the bases but otherwise clear to auscultation. Respiratory effort normal. Cardiovascular system: S1 & S2 heard, RRR. No JVD, murmurs, rubs, gallops or clicks. No pedal edema. Telemetry: Mild sinus tachycardia. 6 beat NSVT on 12/16 p.m. Gastrointestinal system: Abdomen is nondistended, soft and nontender. No organomegaly or masses felt. Normal bowel sounds heard. Foley catheter with pink discoloration of urine (hematuria significantly better than 48 hours ago). Central nervous system: Alert and follow simple instructions. Facial asymmetry and aphasia present. Extremities: Left upper and bilateral lower extremities with seemingly normal power. Right upper extremity, grade 4 x 5 with pronator drift. Skin: Bruising of dorsum of right hand which appears old. Psychiatry: Judgement and insight -impaired.     Data Review   CBC w Diff:  Lab Results  Component Value Date   WBC 11.8 (H) 10/04/2016   HGB 8.5 (L) 10/04/2016   HCT 27.9 (L) 10/04/2016   PLT 157 10/04/2016   LYMPHOPCT 8 09/29/2016   MONOPCT 8 09/29/2016   EOSPCT 2 09/29/2016   BASOPCT 0 09/29/2016    CMP:  Lab Results  Component Value Date   NA 143 10/04/2016   K 3.7 10/04/2016   CL 109 10/04/2016   CO2 21 (L) 10/04/2016   BUN 27 (H) 10/04/2016   CREATININE 1.27 (H) 10/04/2016   PROT 6.7 09/26/2016   ALBUMIN 3.3 (L) 09/26/2016   BILITOT 1.1 09/26/2016   ALKPHOS 135 (H) 09/26/2016   AST 31  09/26/2016   ALT 15 (L) 09/26/2016  .   Total Time in preparing paper work, data evaluation and todays exam - 35 minutes  Esgar Barnick M.D on 10/05/2016 at 2:19 PM  [facility]   Office  [phone]

## 2016-10-05 NOTE — H&P (Signed)
Physical Medicine and Rehabilitation Admission H&P    No chief complaint on file. : HPI: Douglas [name] is a 75 y.o. right handed male with history of diabetes mellitus,Remote tobacco and alcohol use, hypertension, hyperlipidemia, chronic kidney disease with creatinine 1.62. Per chart review patient lives with a roommate who works during the day. One level home with 3 steps to entry. Patient independent prior to admission. Presented 09/26/2016 with aphasia and severe right-sided weakness. CT of the brain shows suspect edema within the left insular cortex, anterior temporal lobe and inferior frontal lobe. No hemorrhage. Troponin mildly elevated 0.64. MRI showed left MCA infarct. Multiple other punctate foci of acute ischemia scattered throughout both cerebral and cerebellar hemispheres. MRA of the head and neck showed occlusion of left MCA at the level of the bifurcation. Patient did not receive TPA. Echocardiogram with ejection fraction of 55% no wall motion abnormalities. 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 Neurology consulted presently on aspirin and Plavix for CVA prophylaxis.No plan for TEE or loop recorder. Recommendations are for a 30 day monitor. Subcutaneous Lovenox for DVT prophylaxis. Patient did receive a urology consult for difficult catheterization with follow-up per Dr. Alinda Money and placement of Foley tube until patient could attempt voiding trial. Patient was suspect aspiration pneumonia identified on chest x-ray and started on intravenous Unasyn. Blood cultures negative. He was transitioned to oral Augmentin. Presently maintained on a dysphagia #2 nectar thick liquid diet. Bouts of agitation and confusion placed on Depakote. Physical and occupational therapy evaluations completed with recommendations of physical medicine rehabilitation consult.Patient was admitted for comprehensive rehabilitation program  Review of Systems  Unable to perform ROS: Language   Past Medical  History:  Diagnosis Date  . CKD (chronic kidney disease)   . DM (diabetes mellitus) ([place])   . Hyperlipidemia   . Hypertension    No past surgical history on file. Family History  Problem Relation Age of Onset  . Heart disease Mother   . Cancer Father   . Stroke Brother 17   Social History:  reports that he quit smoking about 34 years ago. He has never used smokeless tobacco. He reports that he drinks alcohol. He reports that he does not use drugs. Allergies: No Known Allergies Medications Prior to Admission  Medication Sig Dispense Refill  . acetaminophen (TYLENOL) 325 MG tablet Take 2 tablets (650 mg total) by mouth every 4 (four) hours as needed for mild pain (or temp > 37.5 C (99.5 F)).    Marland Kitchen amoxicillin-clavulanate (AUGMENTIN) 875-125 MG tablet Take 1 tablet by mouth every 12 (twelve) hours. Continue through 12/22, and then stop    . [START ON 10/06/2016] aspirin 325 MG tablet Take 1 tablet (325 mg total) by mouth daily.    Marland Kitchen atorvastatin (LIPITOR) 80 MG tablet Take 80 mg by mouth daily.    . cholecalciferol (VITAMIN D) 1000 UNITS tablet Take 1,000 Units by mouth daily.    Derrill Memo ON 10/06/2016] clopidogrel (PLAVIX) 75 MG tablet Take 1 tablet (75 mg total) by mouth daily.    . ferrous sulfate 325 (65 FE) MG tablet Take 325 mg by mouth daily with breakfast.    . insulin aspart (NOVOLOG) 100 UNIT/ML injection Inject 0-15 Units into the skin 3 (three) times daily with meals. 10 mL 11  . insulin aspart (NOVOLOG) 100 UNIT/ML injection Inject 0-5 Units into the skin at bedtime. 10 mL 11  . [START ON 10/06/2016] insulin glargine (LANTUS) 100 UNIT/ML injection Inject 0.12 mLs (12  Units total) into the skin daily. 10 mL 11  . levothyroxine (SYNTHROID, LEVOTHROID) 75 MCG tablet Take 75 mcg by mouth daily before breakfast.     . metoprolol tartrate (LOPRESSOR) 25 MG tablet Take 0.5 tablets (12.5 mg total) by mouth 2 (two) times daily.    Marland Kitchen senna-docusate (SENOKOT-S) 8.6-50 MG tablet Take 1  tablet by mouth at bedtime as needed for mild constipation.    . Valproate Sodium (DEPAKENE) 250 MG/5ML SOLN solution Take 5 mLs (250 mg total) by mouth 2 (two) times daily. 600 mL     Home:     Functional History:    Functional Status:  Mobility:          ADL:    Cognition:      Physical Exam: There were no vitals taken for this visit. Physical Exam  Neurological: He exhibits abnormal muscle tone. Coordination and gait abnormal.  Could not assess sensation due to aphasia  3/5 strength on Right side and 4/5 on Left side    Vitals reviewed. Constitutional: He appears well-developed and well-nourished.  HENT:  Head: Normocephalic and atraumatic.  Eyes: Conjunctivae are normal.  Pupils reactive to light  Neck: Normal range of motion. Neck supple. No thyromegaly present.  Cardiovascular: Regular rhythm.   Tachycardia  102 Respiratory: Effort normal and breath sounds normal.  GI: Soft. Bowel sounds are normal.  Musculoskeletal: He exhibits no edema or tenderness.  Neurological: He is alert.  Right facial weakness Unable to accurately assess motor or sensory, but moving all 4 extremities Not consistently following commands Global aphasia, expressive receptive aphasia. Appears to be more expressive  DTRs 3+ RUE/RLE  Skin: Skin is warm and dry   Results for orders placed or performed during the hospital encounter of 09/26/16 (from the past 48 hour(s))  Glucose, capillary     Status: Abnormal   Collection Time: 10/03/16 10:15 PM  Result Value Ref Range   Glucose-Capillary 229 (H) 65 - 99 mg/dL  CBC     Status: Abnormal   Collection Time: 10/04/16  6:23 AM  Result Value Ref Range   WBC 11.8 (H) 4.0 - 10.5 K/uL   RBC 3.59 (L) 4.22 - 5.81 MIL/uL   Hemoglobin 8.5 (L) 13.0 - 17.0 g/dL   HCT 27.9 (L) 39.0 - 52.0 %   MCV 77.7 (L) 78.0 - 100.0 fL   MCH 23.7 (L) 26.0 - 34.0 pg   MCHC 30.5 30.0 - 36.0 g/dL   RDW 20.3 (H) 11.5 - 15.5 %   Platelets 157 150 - 400 K/uL    Basic metabolic panel     Status: Abnormal   Collection Time: 10/04/16  6:23 AM  Result Value Ref Range   Sodium 143 135 - 145 mmol/L   Potassium 3.7 3.5 - 5.1 mmol/L   Chloride 109 101 - 111 mmol/L   CO2 21 (L) 22 - 32 mmol/L   Glucose, Bld 181 (H) 65 - 99 mg/dL   BUN 27 (H) 6 - 20 mg/dL   Creatinine, Ser 1.27 (H) 0.61 - 1.24 mg/dL   Calcium 9.5 8.9 - 10.3 mg/dL   GFR calc non Af Amer 54 (L) >60 mL/min   GFR calc Af Amer >60 >60 mL/min    Comment: (NOTE) The eGFR has been calculated using the CKD EPI equation. This calculation has not been validated in all clinical situations. eGFR's persistently <60 mL/min signify possible Chronic Kidney Disease.    Anion gap 13 5 - 15  Glucose, capillary  Status: Abnormal   Collection Time: 10/04/16  7:58 AM  Result Value Ref Range   Glucose-Capillary 161 (H) 65 - 99 mg/dL   Comment 1 Notify RN    Comment 2 Document in Chart   Glucose, capillary     Status: Abnormal   Collection Time: 10/04/16 12:19 PM  Result Value Ref Range   Glucose-Capillary 254 (H) 65 - 99 mg/dL   Comment 1 Notify RN    Comment 2 Document in Chart   Glucose, capillary     Status: Abnormal   Collection Time: 10/04/16  5:03 PM  Result Value Ref Range   Glucose-Capillary 293 (H) 65 - 99 mg/dL  Glucose, capillary     Status: Abnormal   Collection Time: 10/04/16  9:37 PM  Result Value Ref Range   Glucose-Capillary 241 (H) 65 - 99 mg/dL   Comment 1 Notify RN   Glucose, capillary     Status: Abnormal   Collection Time: 10/05/16  8:18 AM  Result Value Ref Range   Glucose-Capillary 160 (H) 65 - 99 mg/dL   Comment 1 Notify RN    Comment 2 Document in Chart   Glucose, capillary     Status: Abnormal   Collection Time: 10/05/16 11:49 AM  Result Value Ref Range   Glucose-Capillary 264 (H) 65 - 99 mg/dL   Comment 1 Notify RN    Comment 2 Document in Chart   Glucose, capillary     Status: Abnormal   Collection Time: 10/05/16  5:05 PM  Result Value Ref Range    Glucose-Capillary 324 (H) 65 - 99 mg/dL   No results found.     Medical Problem List and Plan: 1.  Right-sided weakness, aphasia, dysphagia secondary to left MCA infarct 2.  DVT Prophylaxis/Anticoagulation: Subcutaneous Lovenox. Monitor platelet counts and any signs of bleeding 3. Pain Management: Tylenol as needed 4. Dysphagia. Dysphagia #2 nectar liquids. Monitor for any signs of aspiration. And daily at bedtime IV fluids 5. Neuropsych: This patient is capable of making decisions on his own behalf. 6. Skin/Wound Care: Routine skin checks 7. Fluids/Electrolytes/Nutrition: Routine I&O with follow-up chemistries 8. Aspiration pneumonia. Augmentin 8 doses initiated 10/02/2016. 9. Diabetes mellitus with peripheral neuropathy. Hemoglobin A1c 7.2. Lantus insulin 12 units daily. Check blood sugars before meals and at bedtime 10. Hypertension/tachycardia. Lopressor 12.5 mg twice a day. Monitor heart rate with increased mobility 11. Stage III chronic kidney disease. Baseline creatinine 1.62. Follow-up chemistries 12. Mood. Depakote 250 mg BID added for bouts of agitation and restlessness. 13. Hypothyroidism. Synthroid 14. Hyperlipidemia. Lipitor  Post Admission Physician Evaluation: 1. Functional deficits secondary  to Right-sided weakness, aphasia, dysphagia secondary to left MCA infarct. 2. Patient is admitted to receive collaborative, interdisciplinary care between the physiatrist, rehab nursing staff, and therapy team. 3. Patient's level of medical complexity and substantial therapy needs in context of that medical necessity cannot be provided at a lesser intensity of care such as a SNF. 4. Patient has experienced substantial functional loss from his/her baseline which was documented above under the "Functional History" and "Functional Status" headings.  Judging by the patient's diagnosis, physical exam, and functional history, the patient has potential for functional progress which will  result in measurable gains while on inpatient rehab.  These gains will be of substantial and practical use upon discharge  in facilitating mobility and self-care at the household level. 5. Physiatrist will provide 24 hour management of medical needs as well as oversight of the therapy plan/treatment and provide guidance  as appropriate regarding the interaction of the two. 6. The Preadmission Screening has been reviewed and patient status is unchanged unless otherwise stated above. 7. 24 hour rehab nursing will assist with bladder management, bowel management, safety, skin/wound care, disease management, medication administration, pain management and patient education  and help integrate therapy concepts, techniques,education, etc. 8. PT will assess and treat for/with: pre gait, gait training, endurance , safety, equipment, neuromuscular re education.   Goals are: Mod A x1. 9. OT will assess and treat for/with: ADLs, Cognitive perceptual skills, Neuromuscular re education, safety, endurance, equipment.   Goals are: Mod A. Therapy may proceed with showering this patient. 10. SLP will assess and treat for/with: Aphasia apraxia and swallowing.  Goals are: modi. 11. Case Management and Social Worker will assess and treat for psychological issues and discharge planning. 12. Team conference will be held weekly to assess progress toward goals and to determine barriers to discharge. 13. Patient will receive at least 3 hours of therapy per day at least 5 days per week. 14. ELOS: 2-3 wks       15. Prognosis:  fair     Silvestre Mesi PA Charlett Blake, MD 10/05/2016

## 2016-10-05 NOTE — Progress Notes (Signed)
Received via bed, oriented to unit and routine for rehab. Reviewed orders and plan of care. Pt nodded, family noted understanding of information reviewed. Douglas [name], Douglas [name]

## 2016-10-06 ENCOUNTER — Inpatient Hospital Stay (HOSPITAL_COMMUNITY): Payer: Medicare Other | Admitting: Speech Pathology

## 2016-10-06 ENCOUNTER — Inpatient Hospital Stay (HOSPITAL_COMMUNITY): Payer: Medicare Other | Admitting: Occupational Therapy

## 2016-10-06 ENCOUNTER — Inpatient Hospital Stay (HOSPITAL_COMMUNITY): Payer: Medicare Other | Admitting: Physical Therapy

## 2016-10-06 ENCOUNTER — Inpatient Hospital Stay (HOSPITAL_COMMUNITY): Payer: Medicare Other

## 2016-10-06 LAB — CBC WITH DIFFERENTIAL/PLATELET
Basophils Absolute: 0.1 10*3/uL (ref 0.0–0.1)
Basophils Relative: 1 %
EOS ABS: 0.4 10*3/uL (ref 0.0–0.7)
Eosinophils Relative: 3 %
HEMATOCRIT: 27 % — AB (ref 39.0–52.0)
HEMOGLOBIN: 8.2 g/dL — AB (ref 13.0–17.0)
LYMPHS ABS: 1.9 10*3/uL (ref 0.7–4.0)
LYMPHS PCT: 13 %
MCH: 23.7 pg — AB (ref 26.0–34.0)
MCHC: 30.4 g/dL (ref 30.0–36.0)
MCV: 78 fL (ref 78.0–100.0)
Monocytes Absolute: 1.3 10*3/uL — ABNORMAL HIGH (ref 0.1–1.0)
Monocytes Relative: 9 %
NEUTROS PCT: 74 %
Neutro Abs: 11.3 10*3/uL — ABNORMAL HIGH (ref 1.7–7.7)
Platelets: 122 10*3/uL — ABNORMAL LOW (ref 150–400)
RBC: 3.46 MIL/uL — ABNORMAL LOW (ref 4.22–5.81)
RDW: 20.4 % — ABNORMAL HIGH (ref 11.5–15.5)
WBC: 15 10*3/uL — ABNORMAL HIGH (ref 4.0–10.5)

## 2016-10-06 LAB — COMPREHENSIVE METABOLIC PANEL
ALK PHOS: 315 U/L — AB (ref 38–126)
ALT: 69 U/L — AB (ref 17–63)
ANION GAP: 10 (ref 5–15)
AST: 64 U/L — ABNORMAL HIGH (ref 15–41)
Albumin: 2.4 g/dL — ABNORMAL LOW (ref 3.5–5.0)
BILIRUBIN TOTAL: 1 mg/dL (ref 0.3–1.2)
BUN: 26 mg/dL — ABNORMAL HIGH (ref 6–20)
CALCIUM: 9.4 mg/dL (ref 8.9–10.3)
CO2: 25 mmol/L (ref 22–32)
CREATININE: 1.31 mg/dL — AB (ref 0.61–1.24)
Chloride: 108 mmol/L (ref 101–111)
GFR, EST AFRICAN AMERICAN: 60 mL/min — AB (ref >60)
GFR, EST NON AFRICAN AMERICAN: 52 mL/min — AB (ref >60)
Glucose, Bld: 197 mg/dL — ABNORMAL HIGH (ref 65–99)
Potassium: 3.7 mmol/L (ref 3.5–5.1)
Sodium: 143 mmol/L (ref 135–145)
Total Protein: 6.4 g/dL — ABNORMAL LOW (ref 6.5–8.1)

## 2016-10-06 LAB — GLUCOSE, CAPILLARY
GLUCOSE-CAPILLARY: 313 mg/dL — AB (ref 65–99)
Glucose-Capillary: 92 mg/dL (ref 65–99)
Glucose-Capillary: 375 mg/dL — ABNORMAL HIGH (ref 65–99)
Glucose-Capillary: 230 mg/dL — ABNORMAL HIGH (ref 65–99)

## 2016-10-06 NOTE — Plan of Care (Signed)
Pt's plan of care adjusted to 15/7 after speaking with care team and discussed with MD in team conference as pt currently unable to tolerate current therapy schedule with OT, PT, and SLP.   Teodoro Kilaitlin Penven-Crew, PT, DPT 10/06/16 2:56 PM

## 2016-10-06 NOTE — Evaluation (Signed)
Speech Language Pathology Assessment and Plan  Patient Details  Name: Douglas [name] MRN: [id number] Date of Birth: 1941-06-12  SLP Diagnosis: Aphasia;Dysphagia;Cognitive Impairments  Rehab Potential: Fair ELOS: 28 days     Today's Date: 10/06/2016 SLP Individual Time: 3762-8315 SLP Individual Time Calculation (min): 45 min    Problem List: Patient Active Problem List   Diagnosis Date Noted  . 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 Right hemiparesis ([place])   . Urinary retention   . Acute delirium   . CKD (chronic kidney disease) 09/27/2016  . Elevated troponin 09/27/2016  . Anemia 09/27/2016  . Acute embolic stroke ([place])   . Acute systolic heart failure ([place])   . Benign essential HTN   . Tachycardia   . CVA (cerebral vascular accident) ([place]) 09/26/2016  . Hyperlipidemia 11/30/2013  . Essential hypertension 11/30/2013  . DM (diabetes mellitus) ([employer]) 11/30/2013   Past Medical History:  Past Medical History:  Diagnosis Date  . CKD (chronic kidney disease)   . DM (diabetes mellitus) ([place])   . Hyperlipidemia   . Hypertension    Past Surgical History: No past surgical history on file.  Assessment / Plan / Recommendation Clinical Impression  Douglas [name] is a 75 y.o. right handed male who presented 09/26/2016 with aphasia and severe right-sided weakness. CT of the brain shows suspect edema within the left insular cortex, anterior temporal lobe and inferior frontal lobe. No hemorrhage.  MRI showed left MCA infarct. Multiple other punctate foci of acute ischemia scattered throughout both cerebral and cerebellar hemispheres.  Patient was suspect aspiration pneumonia identified on chest x-ray and started on intravenous Unasyn. Blood cultures negative. He was transitioned to oral Augmentin. Presently maintained on a dysphagia #2 nectar thick liquid diet. Patient was admitted for comprehensive rehabilitation program on 10/05/2016.  SLP evaluation completed on  10/06/2016 with the following results:  Pt presents with a limited evaluation today due to significant lethargy impacting pt's ability to maintain alertness for >1 minute.  Pt is able to answer yes/no questions verbally with max assist multimodal cues but his response accuracy is inconsistent due to perseveration.  Pt is better able to follow 1 step commands in a simple, familiar context with mod-max assist multimodal cues.  Pt was otherwise nonverbal during today's assessment with the exception of intermittent islands of incoherent jargon in response to therapist's questions.   Pt also presents with s/s of a moderately severe oropharyngeal dysphagia.  Pt demonstrates decreased containment and slowed transit of boluses due to right sided oral motor weakness.  Pt with explosive coughing episode with cup sip of thin liquids, no overt s/s of aspiration with nectar thick liquids.  Pt needed mod-max assist multimodal cues to monitor and correct anterior labial loss of even pureed textures and was noted wtih pocketing of whole pieces of dys 2 solids from lunch prior to evaluation.  As a result, recommend diet downgrade to dys 1 textures with ongoing need for nectar thick liquids and full staff supervision for use of swallowing precautions.  Pt may need objective assessment to determine safest diet consistencies due to the severity of his impairments noted at bedside.   Given the abovementioned deficits, pt would benefit from skilled ST while inpatient in order to maximize functional independence and reduce burden of care prior to discharge.   Anticipate that pt will need 24/7 supervision care at discharge and no family was present during evaluation.    Skilled Therapeutic Interventions  Cognitive-linguistic, bedside swallow evaluation, and treatment completed, see above.      SLP Assessment  Patient will need skilled Speech Lanaguage Pathology Services during CIR admission    Recommendations  SLP Diet  Recommendations: Dysphagia 1 (Puree);Nectar Liquid Administration via: Cup Medication Administration: Crushed with puree Supervision: Full supervision/cueing for compensatory strategies;Staff to assist with self feeding Compensations: Slow rate;Small sips/bites;Clear throat intermittently;Lingual sweep for clearance of pocketing Postural Changes and/or Swallow Maneuvers: Seated upright 90 degrees;Upright 30-60 min after meal Oral Care Recommendations: Oral care BID Patient destination: [place] (SNF) Follow up Recommendations: Home Health SLP;Outpatient SLP;Skilled Nursing facility;24 hour supervision/assistance Equipment Recommended: To be determined    SLP Frequency 3 to 5 out of 7 days   SLP Duration  SLP Intensity  SLP Treatment/Interventions 28 days   Minumum of 1-2 x/day, 30 to 90 minutes  Cognitive remediation/compensation;Cueing hierarchy;Dysphagia/aspiration precaution training;Environmental controls;Functional tasks;Internal/external aids;Patient/family education    Pain Pain Assessment Pain Assessment: Faces Faces Pain Scale: No hurt   Prior Functioning Cognitive/Linguistic Baseline: Information not available Type of Home: House  Lives With: Friend(s) Available Help at Discharge: Family;Friend(s)  Function:  Eating Eating   Modified Consistency Diet: Yes Eating Assist Level: Help managing cup/glass;Hand over hand assist;Help with picking up utensils;Helper checks for pocketed food   Eating Set Up Assist For: Opening containers       Cognition Comprehension Comprehension assist level: Understands basic 25 - 49% of the time/ requires cueing 50 - 75% of the time  Expression   Expression assist level: Expresses basis less than 25% of the time/requires cueing >75% of the time.  Social Interaction Social Interaction assist level: Interacts appropriately 25 - 49% of time - Needs frequent redirection.  Problem Solving Problem solving assist level:  Solves basic 25 - 49% of the time - needs direction more than half the time to initiate, plan or complete simple activities  Memory Memory assist level: Recognizes or recalls less than 25% of the time/requires cueing greater than 75% of the time   Short Term Goals: Week 1: SLP Short Term Goal 1 (Week 1): Pt will answer yes/no questions for 75% accuracy with max assist multimodal cues.   SLP Short Term Goal 2 (Week 1): Pt will follow 1 step commands for 75% accuracy with max assist multimodal cues.   SLP Short Term Goal 3 (Week 1): Pt will consume dys 1 textures and nectar thick liquids with mod assist for use of swallowing precautions and minimal overt s/s of aspiration.   SLP Short Term Goal 4 (Week 1): Pt will verbalize at the word level during structured tasks for 50% accuracy with max assist multimodal cues. SLP Short Term Goal 5 (Week 1): Pt will utilize multimodal means of communication to convey needs and wants to caregivers with max assist cues.    Refer to Care Plan for Long Term Goals  Recommendations for other services: None   Discharge Criteria: Patient will be discharged from SLP if patient refuses treatment 3 consecutive times without medical reason, if treatment goals not met, if there is a change in medical status, if patient makes no progress towards goals or if patient is discharged from hospital.  The above assessment, treatment plan, treatment alternatives and goals were discussed and mutually agreed upon: No family available/patient unable  Emilio Math 10/06/2016, 4:38 PM

## 2016-10-06 NOTE — Care Management (Signed)
Inpatient Rehabilitation Center Individual Statement of Services  Patient Name:  Douglas [name]  Date:  10/06/2016  Welcome to the Inpatient Rehabilitation Center.  Our goal is to provide you with an individualized program based on your diagnosis and situation, designed to meet your specific needs.  With this comprehensive rehabilitation program, you will be expected to participate in at least 3 hours of rehabilitation therapies Monday-Friday, with modified therapy programming on the weekends.  Your rehabilitation program will include the following services:  Physical Therapy (PT), Occupational Therapy (OT), Speech Therapy (ST), 24 hour per day rehabilitation nursing, Therapeutic Recreaction (TR), Neuropsychology, Case Management (Social Worker), Rehabilitation Medicine, Nutrition Services and Pharmacy Services  Weekly team conferences will be held on Wednesday to discuss your progress.  Your Social Worker will talk with you frequently to get your input and to update you on team discussions.  Team conferences with you and your family in attendance may also be held.  Expected length of stay: 3-4 weeks Overall anticipated outcome: mod assist level  Depending on your progress and recovery, your program may change. Your Social Worker will coordinate services and will keep you informed of any changes. Your Social Worker's name and contact numbers are listed  below.  The following services may also be recommended but are not provided by the Inpatient Rehabilitation Center:   Driving Evaluations  Home Health Rehabiltiation Services  Outpatient Rehabilitation Services    Arrangements will be made to provide these services after discharge if needed.  Arrangements include referral to agencies that provide these services.  Your insurance has been verified to be:  Medicare  & BCBS Your primary doctor is:  Shaune PollackDonna Gates  Pertinent information will be shared with your doctor and your insurance  company.  Social Worker:  Dossie DerBecky Aikam Vinje, SW [phone] or ([phone]) [phone]  Information discussed with and copy given to patient by: Lucy Chrisupree, Kennadi Albany G, 10/06/2016, 9:56 AM

## 2016-10-06 NOTE — Evaluation (Signed)
Physical Therapy Assessment and Plan  Patient Details  Name: Douglas [name] MRN: [id number] Date of Birth: 07/03/1941  PT Diagnosis: Abnormal posture, Abnormality of gait, Coordination disorder, Difficulty walking, Hemiparesis dominant, Impaired cognition and Muscle weakness Rehab Potential: Fair ELOS: 4 weeks   Today's Date: 10/06/2016 PT Individual Time: 1400-1450 PT Individual Time Calculation (min): 50 min     Problem List: Patient Active Problem List   Diagnosis Date Noted  . 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 Right hemiparesis ([place])   . Urinary retention   . Acute delirium   . CKD (chronic kidney disease) 09/27/2016  . Elevated troponin 09/27/2016  . Anemia 09/27/2016  . Acute embolic stroke ([place])   . Acute systolic heart failure ([place])   . Benign essential HTN   . Tachycardia   . CVA (cerebral vascular accident) ([place]) 09/26/2016  . Hyperlipidemia 11/30/2013  . Essential hypertension 11/30/2013  . DM (diabetes mellitus) ([place]) 11/30/2013    Past Medical History:  Past Medical History:  Diagnosis Date  . CKD (chronic kidney disease)   . DM (diabetes mellitus) ([place])   . Hyperlipidemia   . Hypertension    Past Surgical History: No past surgical history on file.  Assessment & Plan Clinical Impression: OSHA ERRICO is a 75 y.o. right handed male with history of diabetes mellitus, remote tobacco and alcohol use, hypertension, hyperlipidemia, chronic kidney disease with creatinine 1.62. Per chart review patient lives with a roommate who works during the day. One level home with 3 steps to entry. Patient independent prior to admission. Presented 09/26/2016 with aphasia and severe right-sided weakness. CT of the brain shows suspect edema within the left insular cortex, anterior temporal lobe and inferior frontal lobe. No hemorrhage. Troponin mildly elevated 0.64. MRI showed left MCA infarct. Multiple other punctate foci of acute ischemia scattered  throughout both cerebral and cerebellar hemispheres. MRA of the head and neck showed occlusion of left MCA at the level of the bifurcation. Patient did not receive TPA. Echocardiogram with ejection fraction of 55% no wall motion abnormalities. 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 Neurology consulted presently on aspirin and Plavix for CVA prophylaxis.No plan for TEE or loop recorder. Recommendations are for a 30 day monitor. Subcutaneous Lovenox for DVT prophylaxis. Patient did receive a urology consult for difficult catheterization with follow-up per Dr. Alinda Money and placement of Foley tube until patient could attempt voiding trial. Patient was suspect aspiration pneumonia identified on chest x-ray and started on intravenous Unasyn. Blood cultures negative. He was transitioned to oral Augmentin. Presently maintained on a dysphagia 2 texture and nectar-thick liquid diet. Bouts of agitation and confusion placed on Depakote. Physical and occupational therapy evaluations completed with recommendations of physical medicine rehabilitation consult. Patient was admitted for comprehensive rehabilitation program 10/05/16.  Patient transferred to CIR on 10/05/2016 .   Patient currently requires total assist > +2 assist with mobility secondary to muscle weakness, impaired timing and sequencing, unbalanced muscle activation, motor apraxia, ataxia, decreased coordination and decreased motor planning, decreased midline orientation, decreased attention to right and ideational apraxia, decreased initiation, decreased attention, decreased awareness, decreased problem solving, decreased safety awareness, decreased memory and delayed processing and decreased sitting balance, decreased standing balance, decreased postural control and decreased balance strategies.  Prior to hospitalization, patient was independent  with mobility and lived with Friend(s) (per chart review) in a House (per chart review) home.  Home access is 4  (per chart review)Stairs to enter (per chart review).  Patient  will benefit from skilled PT intervention to maximize safe functional mobility, minimize fall risk and decrease caregiver burden for planned discharge home with 24 hour assist.  Anticipate patient will require SNF at discharge.  PT - End of Session Activity Tolerance: Decreased this session Endurance Deficit: Yes PT Assessment Rehab Potential (ACUTE/IP ONLY): Fair Barriers to Discharge: Decreased caregiver support;Inaccessible home environment PT Patient demonstrates impairments in the following area(s): Balance;Endurance;Motor;Perception;Safety;Sensory;Skin Integrity PT Transfers Functional Problem(s): Bed Mobility;Bed to Chair;Furniture PT Locomotion Functional Problem(s): Wheelchair Mobility;Ambulation;Stairs PT Plan PT Intensity: Minimum of 1-2 x/day ,45 to 90 minutes PT Frequency: 5 out of 7 days PT Duration Estimated Length of Stay: 4 weeks PT Treatment/Interventions: Ambulation/gait training;Discharge planning;Functional mobility training;Psychosocial support;Therapeutic Activities;Visual/perceptual remediation/compensation;Wheelchair propulsion/positioning;Therapeutic Exercise;Neuromuscular re-education;Balance/vestibular training;Skin care/wound management;Cognitive remediation/compensation;DME/adaptive equipment instruction;UE/LE Strength taining/ROM;Splinting/orthotics;Functional electrical stimulation;Patient/family education;Stair training;UE/LE Coordination activities PT Transfers Anticipated Outcome(s): mod assist PT Locomotion Anticipated Outcome(s): min assist w/c, max assist gait for NMR PT Recommendation Follow Up Recommendations: Skilled nursing facility;24 hour supervision/assistance Patient destination: [place] (SNF) Equipment Recommended: Other (comment) Equipment Details: tbd at next level of care  Skilled Therapeutic Intervention Pt soundly sleeping when PT arrived, initial portion of  session focus on increasing arousal with HOB elevated and +2 assist to transition to sitting EOB.  Pt with minimally increase in arousal sitting EOB.  Sitting EOB focus on upright posture and midline orientation.  Pt requiring total assist to maintain sitting balance with strong R/posterior lean.  Pt pushing to R with LUE supported on bedrail, improved with manual placement of hands in lap.  Pt non-verbal throughout session ?secondary to aphasia versus fatigue/lethargy.  Pt declined standing and nodded head "yes" when asked about wanting to lie back down.  +2 to transition back to supine and total assist +1 to position in R side lying for NMR.  PT provided pt with 18x18 w/c with comfort back for increased support, but may need more restrictive seating system such as a tilt in space to begin with.  Will further assess as able.  Pt left positioned in R side lying with call bell in reach and needs met.   PT Evaluation Precautions/Restrictions Precautions Precautions: Fall Precaution Comments: R side inattention, strong push to R side, posterior lean Restrictions Weight Bearing Restrictions: No Pain Pain Assessment Pain Assessment: No/denies pain PAINAD (Pain Assessment in Advanced Dementia) Breathing: normal Negative Vocalization: none Facial Expression: smiling or inexpressive Body Language: relaxed Consolability: no need to console PAINAD Score: 0 Home Living/Prior Functioning Home Living Available Help at Discharge: Family;Friend(s) (per chart review) Type of Home: House (per chart review) Home Access: Stairs to enter (per chart review) Entrance Stairs-Number of Steps: 4 (per chart review) Home Layout: One level (per chart review) Additional Comments: pt lived with a roomate who works during the day. 1 story home with 3-4 steps to enter.  (per chart review)  Lives With: Friend(s) (per chart review) Prior Function Level of Independence: Other (comment) (per chart review Indep PLOF)  Able  to Take Stairs?: Yes Driving:  (unknown) Vision/Perception  Vision - Assessment Additional Comments: needs further assessment once arousal improves Perception Comments: needs further assessment once arousal improves Praxis Praxis-Other Comments: needs further asessment once arousal improves  Cognition Overall Cognitive Status: Difficult to assess Arousal/Alertness:  (somnolent) Orientation Level: Oriented to person (focus attention on therapist when name stated) Attention: Focused Focused Attention: Appears intact Sustained Attention:  (unable to stay alert for therapy) Comments: pt unable to stay alert throughout session, requiring max multimodal cues for arousal  Sensation Sensation Light Touch: Appears Intact (withdraws LEs to touch) Motor  Motor Motor: Abnormal postural alignment and control;Motor impersistence;Other (comment) Motor - Skilled Clinical Observations: R hemiparesis  Mobility Bed Mobility Bed Mobility: Rolling Right;Sit to Supine;Supine to Sit Rolling Right: 1: +1 Total assist Rolling Right Details: Manual facilitation for weight shifting;Verbal cues for safe use of DME/AE;Manual facilitation for weight bearing;Manual facilitation for placement;Verbal cues for sequencing;Verbal cues for technique;Verbal cues for precautions/safety Supine to Sit: 1: +2 Total assist Supine to Sit Details: Manual facilitation for placement;Manual facilitation for weight bearing;Manual facilitation for weight shifting;Verbal cues for safe use of DME/AE;Verbal cues for precautions/safety;Verbal cues for technique;Verbal cues for sequencing Sit to Supine: 1: +2 Total assist Sit to Supine - Details: Manual facilitation for placement;Verbal cues for sequencing;Verbal cues for technique;Verbal cues for precautions/safety;Manual facilitation for weight shifting;Manual facilitation for weight bearing;Verbal cues for safe use of DME/AE Transfers Transfers: No Locomotion   Ambulation Ambulation: No Gait Gait: No Stairs / Additional Locomotion Stairs: No Wheelchair Mobility Wheelchair Mobility: No  Trunk/Postural Assessment  Cervical Assessment Cervical Assessment: Exceptions to [facility] (forward head) Thoracic Assessment Thoracic Assessment: Exceptions to [facility] (severely kyphotic) Lumbar Assessment Lumbar Assessment: Exceptions to [facility] (posterior pelvic tilt) Postural Control Postural Control: Deficits on evaluation Head Control: severely impaired Trunk Control: severely impaired Righting Reactions: absent Protective Responses: absent  Balance Balance Balance Assessed: Yes Static Sitting Balance Static Sitting - Level of Assistance: 1: +1 Total assist Dynamic Sitting Balance Dynamic Sitting - Level of Assistance: 1: +2 Total assist Extremity Assessment      RLE Strength RLE Overall Strength Comments: able to move against gravity in bed, unable to formally assess 2/2 level of arousal LLE Strength LLE Overall Strength Comments: able to move against gravity in bed, unable to formally assess 2/2 level of arousal   See Function Navigator for Current Functional Status.   Refer to Care Plan for Long Term Goals  Recommendations for other services: None   Discharge Criteria: Patient will be discharged from PT if patient refuses treatment 3 consecutive times without medical reason, if treatment goals not met, if there is a change in medical status, if patient makes no progress towards goals or if patient is discharged from hospital.  The above assessment, treatment plan, treatment alternatives and goals were discussed and mutually agreed upon: No family available/patient unable  Marne Meline E Penven-Crew 10/06/2016, 3:09 PM

## 2016-10-06 NOTE — Progress Notes (Signed)
Subjective/Complaints: Patient remains aphasic. He is eating breakfast with full supervision from CNA. No coughing has been noted. Trying to pull IV last night as well as Foley. Has mitts  Review of systems unable to obtain secondary to aphasia  Objective: Vital Signs: Blood pressure (!) 141/68, pulse 79, temperature 97.8 F (36.6 C), temperature source Oral, resp. rate 18, SpO2 99 %. No results found. Results for orders placed or performed during the hospital encounter of 10/05/16 (from the past 72 hour(s))  CBC     Status: Abnormal   Collection Time: 10/05/16  6:59 PM  Result Value Ref Range   WBC 12.1 (H) 4.0 - 10.5 K/uL   RBC 3.62 (L) 4.22 - 5.81 MIL/uL   Hemoglobin 8.4 (L) 13.0 - 17.0 g/dL   HCT 28.6 (L) 39.0 - 52.0 %   MCV 79.0 78.0 - 100.0 fL   MCH 23.2 (L) 26.0 - 34.0 pg   MCHC 29.4 (L) 30.0 - 36.0 g/dL   RDW 20.6 (H) 11.5 - 15.5 %   Platelets 131 (L) 150 - 400 K/uL  Creatinine, serum     Status: Abnormal   Collection Time: 10/05/16  6:59 PM  Result Value Ref Range   Creatinine, Ser 1.45 (H) 0.61 - 1.24 mg/dL   GFR calc non Af Amer 46 (L) >60 mL/min   GFR calc Af Amer 53 (L) >60 mL/min    Comment: (NOTE) The eGFR has been calculated using the CKD EPI equation. This calculation has not been validated in all clinical situations. eGFR's persistently <60 mL/min signify possible Chronic Kidney Disease.   Glucose, capillary     Status: Abnormal   Collection Time: 10/05/16  8:57 PM  Result Value Ref Range   Glucose-Capillary 329 (H) 65 - 99 mg/dL  CBC WITH DIFFERENTIAL     Status: Abnormal   Collection Time: 10/06/16  5:54 AM  Result Value Ref Range   WBC 15.0 (H) 4.0 - 10.5 K/uL   RBC 3.46 (L) 4.22 - 5.81 MIL/uL   Hemoglobin 8.2 (L) 13.0 - 17.0 g/dL   HCT 27.0 (L) 39.0 - 52.0 %   MCV 78.0 78.0 - 100.0 fL   MCH 23.7 (L) 26.0 - 34.0 pg   MCHC 30.4 30.0 - 36.0 g/dL   RDW 20.4 (H) 11.5 - 15.5 %   Platelets 122 (L) 150 - 400 K/uL   Neutrophils Relative % 74 %   Neutro Abs 11.3 (H) 1.7 - 7.7 K/uL   Lymphocytes Relative 13 %   Lymphs Abs 1.9 0.7 - 4.0 K/uL   Monocytes Relative 9 %   Monocytes Absolute 1.3 (H) 0.1 - 1.0 K/uL   Eosinophils Relative 3 %   Eosinophils Absolute 0.4 0.0 - 0.7 K/uL   Basophils Relative 1 %   Basophils Absolute 0.1 0.0 - 0.1 K/uL  Comprehensive metabolic panel     Status: Abnormal   Collection Time: 10/06/16  5:54 AM  Result Value Ref Range   Sodium 143 135 - 145 mmol/L   Potassium 3.7 3.5 - 5.1 mmol/L   Chloride 108 101 - 111 mmol/L   CO2 25 22 - 32 mmol/L   Glucose, Bld 197 (H) 65 - 99 mg/dL   BUN 26 (H) 6 - 20 mg/dL   Creatinine, Ser 1.31 (H) 0.61 - 1.24 mg/dL   Calcium 9.4 8.9 - 10.3 mg/dL   Total Protein 6.4 (L) 6.5 - 8.1 g/dL   Albumin 2.4 (L) 3.5 - 5.0 g/dL   AST 64 (  H) 15 - 41 U/L   ALT 69 (H) 17 - 63 U/L   Alkaline Phosphatase 315 (H) 38 - 126 U/L   Total Bilirubin 1.0 0.3 - 1.2 mg/dL   GFR calc non Af Amer 52 (L) >60 mL/min   GFR calc Af Amer 60 (L) >60 mL/min    Comment: (NOTE) The eGFR has been calculated using the CKD EPI equation. This calculation has not been validated in all clinical situations. eGFR's persistently <60 mL/min signify possible Chronic Kidney Disease.    Anion gap 10 5 - 15  Glucose, capillary     Status: None   Collection Time: 10/06/16  7:03 AM  Result Value Ref Range   Glucose-Capillary 92 65 - 99 mg/dL      General: No acute distress Mood and affect are appropriate Heart: Regular rate and rhythm no rubs murmurs or extra sounds Lungs: Clear to auscultation, breathing unlabored, no rales or wheezes Abdomen: Positive bowel sounds, soft nontender to palpation, nondistended Extremities: No clubbing, cyanosis, or edema Skin: No evidence of breakdown, no evidence of rash Neurologic: Cranial nerves II through XII intact, motor strength cannot test due to apraxia Sensory exam, cannot assess due to aphasia Musculoskeletal: Full range of motion in all 4 extremities. No joint  swelling   Assessment/Plan: 1. 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 Physiatrist is providing close team supervision and 24 hour management of active medical problems listed below. Physiatrist and rehab team continue to assess barriers to discharge/monitor patient progress toward functional and medical goals. FIM:       Function - Toileting Toileting steps completed by patient: Performs perineal hygiene Toileting steps completed by helper: Performs perineal hygiene                           Medical Problem List and Plan: 1.  Right-sided weakness, aphasia, dysphagia secondary to left MCA infarct 2.  DVT Prophylaxis/Anticoagulation: Subcutaneous Lovenox. Monitor platelet counts and any signs of bleeding 3. Pain Management: Tylenol as needed 4. Dysphagia. Dysphagia #2 nectar liquids. Monitor for any signs of aspiration. And daily at bedtime IV fluids 5. Neuropsych: This patient is capable of making decisions on his own behalf. 6. Skin/Wound Care: Routine skin checks 7. Fluids/Electrolytes/Nutrition: Routine I&O with follow-up chemistries 8. Aspiration pneumonia. Augmentin 8 doses initiated 10/02/2016. 9. Diabetes mellitus with peripheral neuropathy. Hemoglobin A1c 7.2. Lantus insulin 12 units daily. Check blood sugars before meals and at bedtime 10. Hypertension/tachycardia. Lopressor 12.5 mg twice a day. Monitor heart rate with increased mobility 11. Stage III chronic kidney disease. Baseline creatinine 1.62. Follow-up chemistries  BMP Latest Ref Rng & Units 10/06/2016 10/05/2016 10/04/2016  Glucose 65 - 99 mg/dL 197(H) - 181(H)  BUN 6 - 20 mg/dL 26(H) - 27(H)  Creatinine 0.61 - 1.24 mg/dL 1.31(H) 1.45(H) 1.27(H)  Sodium 135 - 145 mmol/L 143 - 143  Potassium 3.5 - 5.1 mmol/L 3.7 - 3.7  Chloride 101 - 111 mmol/L  108 - 109  CO2 22 - 32 mmol/L 25 - 21(L)  Calcium 8.9 - 10.3 mg/dL 9.4 - 9.5   12. Mood. Depakote 250 mg BID added for bouts of agitation and restlessness. 13. Hypothyroidism. Synthroid 14. Hyperlipidemia. Lipitor 15. Anemia. Hemoglobin Stable check stool guaic CBC Latest Ref Rng & Units 10/06/2016 10/05/2016 10/04/2016  WBC 4.0 - 10.5 K/uL 15.0(H)  12.1(H) 11.8(H)  Hemoglobin 13.0 - 17.0 g/dL 8.2(L) 8.4(L) 8.5(L)  Hematocrit 39.0 - 52.0 % 27.0(L) 28.6(L) 27.9(L)  Platelets 150 - 400 K/uL 122(L) 131(L) 157  16.  Leukocytosis no fever, check CXR, foley so no UA ordered LOS (Days) 1 A FACE TO FACE EVALUATION WAS PERFORMED  Samanvi Cuccia E 10/06/2016, 9:33 AM

## 2016-10-06 NOTE — Progress Notes (Signed)
Social Work Assessment and Plan Social Work Assessment and Plan  Patient Details  Name: Douglas [name] MRN: [id number] Date of Birth: 11/20/1940  Today's Date: 10/06/2016  Problem List:  Patient Active Problem List   Diagnosis Date Noted  . 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 Right hemiparesis (HCC)   . Urinary retention   . Acute delirium   . CKD (chronic kidney disease) 09/27/2016  . Elevated troponin 09/27/2016  . Anemia 09/27/2016  . Acute embolic stroke (HCC)   . Acute systolic heart failure (HCC)   . Benign essential HTN   . Tachycardia   . CVA (cerebral vascular accident) (HCC) 09/26/2016  . Hyperlipidemia 11/30/2013  . Essential hypertension 11/30/2013  . DM (diabetes mellitus) (HCC) 11/30/2013   Past Medical History:  Past Medical History:  Diagnosis Date  . CKD (chronic kidney disease)   . DM (diabetes mellitus) (HCC)   . Hyperlipidemia   . Hypertension    Past Surgical History: No past surgical history on file. Social History:  reports that he quit smoking about 34 years ago. He has never used smokeless tobacco. He reports that he drinks alcohol. He reports that he does not use drugs.  Family / Support Systems Marital Status: Single Patient Roles: Other (Comment) (Uncle & roommate) Other Supports: Douglas [name] [phone] Douglas [name]  Douglas [name]-[phone] Anticipated Caregiver: Unsure at this time  Ability/Limitations of Caregiver: Roommate and nephew both work fulltime, could not provide 24 hr care Caregiver Availability: Other (Comment) (Awaiting progress to see if going home possible) Family Dynamics: Close with his niece and nephew, along with his roommate. Douglas [name] like a son to pt. All have been ehre and will make sure he has what he needs, but can not provide 24 hr care. Pt was very independent prior to this stroke.  Social History Preferred language: English Religion:  Non-Denominational Cultural Background: No issues Education: High School Read: Yes Write: Yes Employment Status: Retired Fish farm managerLegal Hisotry/Current Legal Issues: No issues Guardian/Conservator: Douglas [name]-is his POA, pt is not capable of making his own decisions at this time, so will look toward BurtonWallace to make any for him.   Abuse/Neglect Physical Abuse: Denies Verbal Abuse: Denies Sexual Abuse: Denies Exploitation of patient/patient's resources: Denies Self-Neglect: Denies  Emotional Status Pt's affect, behavior adn adjustment status: Pt has alwasy been independent and taken care of himself. He is not able to assist with assessment due to speech/language issues. Information obtained from nephew. Douglas PlaterWallace is hopeful he will make progress while here, but is realistic also about his recovery and lenght of time it may take. Recent Psychosocial Issues: other health issues but were managed Pyschiatric History: No history as far as nephew knows. Deferred depression screen due to pt's speech and language issues from his stroke. Will ask team to observe in therapies to see if signs of depression. Would benefit from neuro-psych seeing when appropriate. Substance Abuse History: No issues  Patient / Family Perceptions, Expectations & Goals Pt/Family understanding of illness & functional limitations: Nephew and Niece are here visiting and talk with the MD's and feel they have a good understanding of his stroke and deficits. Both are hopeful he will do well here. Premorbid pt/family roles/activities: Education officer, communityUncle, friend, roommate, retiree, etc Anticipated changes in roles/activities/participation: resume Pt/family expectations/goals: Douglas PlaterWallace states: " I hope he does well here and can help some but we can't provide 24 hour care to him."    [employer] Resources Community Agencies: None Premorbid Home Care/DME Agencies: None Transportation available at discharge: roommate  and friends Resource referrals recommended:  Neuropsychology, Support group (specify)  Discharge Planning Living Arrangements: Non-relatives/Friends Support Systems: Other relatives, Friends/neighbors Type of Residence: Private residence Insurance Resources: Harrah[employer] EntertainmentMedicare, Media plannerrivate Insurance (specify) Herbalist(BCBS) Financial Resources: Restaurant manager, fast foodocial Security Financial Screen Referred: No Living Expenses: Own Money Management: Patient Does the patient have any problems obtaining your medications?: No Home Management: Both he and Douglas [name] Patient/Family Preliminary Plans: Unsure at this time, his family can not provide 24 hr care and it looks like he will need this upon discharge due to the severity of his stroke. Will await team's evaluations and work on a safe discharge plan for him. Social Work Anticipated Follow Up Needs: HH/OP, SNF, Support Group  Clinical Impression Unfortunate gentleman who was very independent until this stroke. He has a niece, nephew and roommate who are supportive, but all work and can not provide 24 hr care. Will await progress and work on a appropriate discharge plan for pt. Will assess pt once he is able to communicate with worker and then refer to neuro-psych, due to feel would benefit from this. Await therapy team's evaluations.   Douglas [name] Douglas [name], Douglas [name] G 10/06/2016, 10:13 AM

## 2016-10-06 NOTE — Evaluation (Signed)
Occupational Therapy Assessment and Plan  Patient Details  Name: Douglas [name] MRN: [id number] Date of Birth: 1940/11/05  OT Diagnosis: abnormal posture, apraxia, cognitive deficits, disturbance of vision, hemiplegia affecting dominant side and muscle weakness (generalized) Rehab Potential: Rehab Potential (ACUTE ONLY): Fair ELOS: 3-4 weeks   Today's Date: 10/06/2016 OT Individual Time: 6962-9528 OT Individual Time Calculation (min): 86 min      Problem List: Patient Active Problem List   Diagnosis Date Noted  . 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 Right hemiparesis ([place])   . Urinary retention   . Acute delirium   . CKD (chronic kidney disease) 09/27/2016  . Elevated troponin 09/27/2016  . Anemia 09/27/2016  . Acute embolic stroke ([place])   . Acute systolic heart failure ([place])   . Benign essential HTN   . Tachycardia   . CVA (cerebral vascular accident) ([place]) 09/26/2016  . Hyperlipidemia 11/30/2013  . Essential hypertension 11/30/2013  . DM (diabetes mellitus) ([place]) 11/30/2013    Past Medical History:  Past Medical History:  Diagnosis Date  . CKD (chronic kidney disease)   . DM (diabetes mellitus) ([place])   . Hyperlipidemia   . Hypertension    Past Surgical History: No past surgical history on file.  Assessment & Plan Clinical Impression: Patient is a 75 y.o. right handed malewith history of diabetes mellitus,Remote tobacco and alcohol use, hypertension, hyperlipidemia, chronic kidney disease with creatinine 1.62. Per chart review patient lives with a roommate who works during the day. One level home with 3 steps to entry. Patient independent prior to admission. Presented 09/26/2016 with aphasia and severe right-sided weakness. CT of the brain shows suspect edema within the left insular cortex, anterior temporal lobe and inferior frontal lobe. No hemorrhage. Troponin mildly elevated 0.64. MRI showed left MCA infarct. Multiple other punctate foci of acute  ischemia scattered throughout both cerebral and cerebellar hemispheres. MRA of the head and neck showed occlusion of left MCA at the level of the bifurcation. Patient did not receive TPA. Echocardiogram with ejection fraction of 55% no wall motion abnormalities. 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 Neurology consulted presently on aspirin and Plavix for CVA prophylaxis.No plan for TEE or loop recorder. Recommendations are for a 30 day monitor. Subcutaneous Lovenox for DVT prophylaxis. Patient did receive a urology consult for difficult catheterization with follow-up per Dr. Alinda Money and placement of Foley tube until patient could attempt voiding trial. Patient was suspect aspiration pneumonia identified on chest x-ray and started on intravenous Unasyn. Blood cultures negative. He was transitioned to oral Augmentin. Presently maintained on a dysphagia #2 nectar thick liquid diet. Bouts of agitation and confusion placed on Depakote. Physical and occupational therapy evaluations completed with recommendations of physical medicine rehabilitation consult.   Patient transferred to CIR on 10/05/2016 .    Patient currently requires total assist of 2 caregivers with basic self-care skills secondary to muscle weakness, decreased cardiorespiratoy endurance, impaired timing and sequencing, abnormal tone, unbalanced muscle activation, motor apraxia, decreased coordination and decreased motor planning, decreased visual perceptual skills, decreased midline orientation, decreased attention to right and ideational apraxia, decreased initiation, decreased attention, decreased awareness and decreased problem solving and decreased sitting balance, decreased standing balance, decreased postural control, hemiplegia and decreased balance strategies.  Prior to hospitalization, patient could complete ADLs with independent .  Patient will benefit from skilled intervention to decrease level of assist with basic self-care skills  prior to discharge to SNF.  Anticipate patient will require 24 hour supervision and  moderate physical assestance and follow up home health and after SNF.  OT - End of Session Activity Tolerance: Tolerates 30+ min activity with multiple rests Endurance Deficit: Yes Endurance Deficit Description: pt falling asleep by end of session OT Assessment Rehab Potential (ACUTE ONLY): Fair Barriers to Discharge: Decreased caregiver support Barriers to Discharge Comments: lives with roommate that works.  [place]ieces and nephes in area work as well OT Patient demonstrates impairments in the following area(s): Balance;Cognition;Endurance;Motor;Pain;Perception;Safety;Sensory;Skin Integrity;Vision OT Basic ADL's Functional Problem(s): Grooming;Bathing;Dressing;Toileting OT Transfers Functional Problem(s): Toilet;Tub/Shower OT Additional Impairment(s): Fuctional Use of Upper Extremity OT Plan OT Intensity: Minimum of 1-2 x/day, 45 to 90 minutes OT Frequency: Total of 15 hours over 7 days of combined therapies OT Duration/Estimated Length of Stay: 3-4 weeks OT Treatment/Interventions: Balance/vestibular training;Cognitive remediation/compensation;Discharge planning;Disease mangement/prevention;DME/adaptive equipment instruction;Functional mobility training;Neuromuscular re-education;Pain management;Patient/family education;Psychosocial support;Self Care/advanced ADL retraining;Skin care/wound managment;Splinting/orthotics;Therapeutic Activities;Therapeutic Exercise;UE/LE Strength taining/ROM;UE/LE Coordination activities;Visual/perceptual remediation/compensation;Wheelchair propulsion/positioning OT Self Feeding Anticipated Outcome(s): no goal established on eval OT Basic Self-Care Anticipated Outcome(s): Mod assist  OT Toileting Anticipated Outcome(s): Mod assist OT Bathroom Transfers Anticipated Outcome(s): Mod assist OT Recommendation Patient destination: [place] (SNF) Follow Up Recommendations:  Skilled nursing facility Equipment Recommended: To be determined   Skilled Therapeutic Intervention OT eval completed with focus on ADL assessment and transfers.  Upon arrival, pt seated in w/c with 2 nurse techs present reporting need for pt to toilet.  Completed squat pivot transfer to Rt with +2 assistance due to pushing to Rt and decrease initiation and motor planning.  Sit > stand with grab bars and +2 for safety with pt initiating approx 30% of movement and then requiring mod assist to maintain standing while 2nd person completed hygiene.  Attempted use of Stedy for transfer back to w/c to decrease burden with transfers.  Due to extensor tone, pushing tendencies, and inattention to RUE/RLE required +2 total assist for trunk control and BLE placement even while in [place], therefore recommending use of Maxi-move for transfers with nursing staff and bed pan at this time for toileting.  Engaged in bathing at sit > stand level at sink with pt requiring hand over hand assist for initiation for all bathing tasks with minimal initiation and sequencing once task begun.  +2 for sit > stand for LB dressing and hygiene with pt demonstrating increased pushing tendencies as well as cervical flexion, requiring cues to bring head upright to increase posture.  Use of mirror for visual feedback with minimal impact.  Pt able to move all 4 extremities but with decreased sequencing and not to command.  Ideational apraxia noted with oral care and when provided with comb, pt attempted to brush teeth.  Squat pivot +2 back to bed and pt left semi-reclined in bed with friend who had arrived during last bit of session.  OT Evaluation Precautions/Restrictions  Precautions Precautions: Fall Precaution Comments: R side inattention, strong push to R side, posterior lean Restrictions Weight Bearing Restrictions: No General   Vital Signs Therapy Vitals Temp: 99.1 F (37.3 C) Temp Source: Oral Pulse Rate: (!) 106 (RN  notified) Resp: 20 BP: (!) 136/51 (RN notified) Patient Position (if appropriate): Lying Oxygen Therapy SpO2: (!) 89 % (RN notified) O2 Device: Not Delivered Pain Pain Assessment Pain Assessment: No/denies pain PAINAD (Pain Assessment in Advanced Dementia) Breathing: normal Negative Vocalization: none Facial Expression: smiling or inexpressive Body Language: relaxed Consolability: no need to console PAINAD Score: 0 Home Living/Prior Functioning Home Living Living Arrangements: Non-relatives/Friends Available Help at Discharge: Family, Friend(s) (  per chart review) Type of Home: House (per chart review) Home Access: Stairs to enter (per chart review) Entrance Stairs-Number of Steps: 4 (per chart review) Home Layout: One level (per chart review) Additional Comments: pt lived with a roomate who works during the day. 1 story home with 3-4 steps to enter.  (per chart review)  Lives With: Friend(s) (roommate per chart review) Prior Function Level of Independence: Independent with basic ADLs, Independent with homemaking with ambulation, Independent with gait (per chart review, independent PLOF)  Able to Take Stairs?: Yes Driving:  (unknown) ADL  See Function Navigator Vision/Perception  Vision- History Baseline Vision/History: Wears glasses Wears Glasses: At all times Patient Visual Report:  (difficult to assess secondary to aphasia and decreased attention) Vision- Assessment Vision Assessment?: Vision impaired- to be further tested in functional context Additional Comments: Pt with Lt head turn and gaze preference.  Vision to be further assessed during functional tasks Perception Comments: Rt inattention noted during session, however pt able to scan to Rt with increased cues Praxis Praxis-Other Comments: needs further asessment once arousal improves  Cognition Overall Cognitive Status: Difficult to assess Arousal/Alertness: Lethargic Orientation Level: Nonverbal/unable to  assess (Unable to assess due to aphasia, but pt does respond to name) Attention: Sustained Focused Attention: Appears intact Sustained Attention: Impaired Sustained Attention Impairment: Verbal basic;Functional basic Problem Solving: Impaired Behaviors: Perseveration Comments: Pt with difficulty following directions, ideational apraxia, and decreased attention to task.  Will continue to assess through functional tasks Sensation Sensation Light Touch: Impaired by gross assessment (impaired RUE) Proprioception: Impaired by gross assessment Coordination Gross Motor Movements are Fluid and Coordinated: No Fine Motor Movements are Fluid and Coordinated: No Coordination and Movement Description: difficult to assess due to aphasia and decreased attention Motor  Motor Motor: Abnormal postural alignment and control;Motor impersistence;Other (comment) Motor - Skilled Clinical Observations: R hemiparesis Mobility  Bed Mobility Bed Mobility: Rolling Right;Sit to Supine;Supine to Sit Rolling Right: 1: +1 Total assist Rolling Right Details: Manual facilitation for weight shifting;Verbal cues for safe use of DME/AE;Manual facilitation for weight bearing;Manual facilitation for placement;Verbal cues for sequencing;Verbal cues for technique;Verbal cues for precautions/safety Supine to Sit: 1: +2 Total assist Supine to Sit Details: Manual facilitation for placement;Manual facilitation for weight bearing;Manual facilitation for weight shifting;Verbal cues for safe use of DME/AE;Verbal cues for precautions/safety;Verbal cues for technique;Verbal cues for sequencing Sit to Supine: 1: +2 Total assist Sit to Supine - Details: Manual facilitation for placement;Verbal cues for sequencing;Verbal cues for technique;Verbal cues for precautions/safety;Manual facilitation for weight shifting;Manual facilitation for weight bearing;Verbal cues for safe use of DME/AE  Trunk/Postural Assessment  Cervical  Assessment Cervical Assessment: Exceptions to [facility] (forward head) Thoracic Assessment Thoracic Assessment: Exceptions to [facility] (severely kyphotic) Lumbar Assessment Lumbar Assessment: Exceptions to [facility] (posterior pelvic tilt) Postural Control Postural Control: Deficits on evaluation Head Control: severely impaired Trunk Control: severely impaired Righting Reactions: absent Protective Responses: absent  Balance Balance Balance Assessed: Yes Static Sitting Balance Static Sitting - Level of Assistance: 1: +1 Total assist Dynamic Sitting Balance Dynamic Sitting - Level of Assistance: 1: +2 Total assist Extremity/Trunk Assessment RUE Assessment RUE Assessment: Exceptions to [facility] RUE AROM (degrees) Overall AROM Right Upper Extremity: Deficits;Unable to assess RUE Overall AROM Comments: difficult to assess due to aphasia and decreased attention with decreased ability to follow directions.  Pt able to move RUE but not during purposeful tasks RUE Strength RUE Overall Strength: Deficits;Unable to assess RUE Overall Strength Comments: difficult to assess due to aphasia and decreased attention with  decreased ability to follow directions LUE Assessment LUE Assessment: Within Functional Limits   See Function Navigator for Current Functional Status.   Refer to Care Plan for Long Term Goals  Recommendations for other services: None    Discharge Criteria: Patient will be discharged from OT if patient refuses treatment 3 consecutive times without medical reason, if treatment goals not met, if there is a change in medical status, if patient makes no progress towards goals or if patient is discharged from hospital.  The above assessment, treatment plan, treatment alternatives and goals were discussed and mutually agreed upon: No family available/patient unable  Simonne Come 10/06/2016, 3:47 PM

## 2016-10-06 NOTE — Progress Notes (Signed)
Patient information reviewed and entered into eRehab system by Chelbi Herber, RN, CRRN, PPS Coordinator.  Information including medical coding and functional independence measure will be reviewed and updated through discharge.     Per nursing patient was given "Data Collection Information Summary for Patients in Inpatient Rehabilitation Facilities with attached "Privacy Act Statement-Health Care Records" upon admission.  

## 2016-10-07 ENCOUNTER — Inpatient Hospital Stay (HOSPITAL_COMMUNITY): Payer: Medicare Other | Admitting: Speech Pathology

## 2016-10-07 ENCOUNTER — Inpatient Hospital Stay (HOSPITAL_COMMUNITY): Payer: Medicare Other | Admitting: *Deleted

## 2016-10-07 ENCOUNTER — Inpatient Hospital Stay (HOSPITAL_COMMUNITY): Payer: Medicare Other | Admitting: Occupational Therapy

## 2016-10-07 DIAGNOSIS — I69391 Dysphagia following cerebral infarction: Secondary | ICD-10-CM

## 2016-10-07 DIAGNOSIS — D62 Acute posthemorrhagic anemia: Secondary | ICD-10-CM

## 2016-10-07 DIAGNOSIS — D696 Thrombocytopenia, unspecified: Secondary | ICD-10-CM

## 2016-10-07 DIAGNOSIS — E46 Unspecified protein-calorie malnutrition: Secondary | ICD-10-CM

## 2016-10-07 DIAGNOSIS — I10 Essential (primary) hypertension: Secondary | ICD-10-CM

## 2016-10-07 DIAGNOSIS — E1142 Type 2 diabetes mellitus with diabetic polyneuropathy: Secondary | ICD-10-CM

## 2016-10-07 DIAGNOSIS — R7309 Other abnormal glucose: Secondary | ICD-10-CM

## 2016-10-07 DIAGNOSIS — E8809 Other disorders of plasma-protein metabolism, not elsewhere classified: Secondary | ICD-10-CM

## 2016-10-07 DIAGNOSIS — D72829 Elevated white blood cell count, unspecified: Secondary | ICD-10-CM

## 2016-10-07 LAB — GLUCOSE, CAPILLARY
GLUCOSE-CAPILLARY: 147 mg/dL — AB (ref 65–99)
GLUCOSE-CAPILLARY: 240 mg/dL — AB (ref 65–99)
Glucose-Capillary: 293 mg/dL — ABNORMAL HIGH (ref 65–99)
Glucose-Capillary: 302 mg/dL — ABNORMAL HIGH (ref 65–99)

## 2016-10-07 LAB — URINALYSIS, ROUTINE W REFLEX MICROSCOPIC
Bilirubin Urine: NEGATIVE
Ketones, ur: NEGATIVE mg/dL
Nitrite: NEGATIVE
PH: 5 (ref 5.0–8.0)
Protein, ur: 30 mg/dL — AB
Specific Gravity, Urine: 1.016 (ref 1.005–1.030)
Squamous Epithelial / LPF: NONE SEEN

## 2016-10-07 LAB — OCCULT BLOOD X 1 CARD TO LAB, STOOL: FECAL OCCULT BLD: NEGATIVE

## 2016-10-07 MED ORDER — PRO-STAT SUGAR FREE PO LIQD
30.0000 mL | Freq: Two times a day (BID) | ORAL | Status: DC
  Administered 2016-10-07 – 2016-10-31 (×45): 30 mL via ORAL
  Filled 2016-10-07 (×48): qty 30

## 2016-10-07 NOTE — IPOC Note (Signed)
Overall Plan of Care [facility](IPOC) Patient Details Name: Douglas [name] MRN: [id number] DOB: 07/09/1941  Admitting Diagnosis: CVA  Hospital Problems: Active Problems:   Left middle cerebral artery stroke (HCC)   Right hemiparesis (HCC)   Dysphagia due to recent cerebral infarction   Hypoalbuminemia due to protein-calorie malnutrition (HCC)   Thrombocytopenia (HCC)   Leukocytosis   Acute blood loss anemia   Type 2 diabetes mellitus with peripheral neuropathy (HCC)   Labile blood glucose     Functional Problem List: Nursing Bladder, Bowel, Nutrition, Safety, Endurance, Medication Management  PT Balance, Endurance, Motor, Perception, Safety, Sensory, Skin Integrity  OT Balance, Cognition, Endurance, Motor, Pain, Perception, Safety, Sensory, Skin Integrity, Vision  SLP Cognition, Linguistic, Nutrition  TR         Basic ADL's: OT Grooming, Bathing, Dressing, Toileting     Advanced  ADL's: OT       Transfers: PT Bed Mobility, Bed to Chair, Occupational psychologisturniture  OT Toilet, Research scientist (life sciences)Tub/Shower     Locomotion: PT Psychologist, prison and probation servicesWheelchair Mobility, Ambulation, Stairs     Additional Impairments: OT Fuctional Use of Upper Extremity  SLP Swallowing, Communication, Social Cognition comprehension, expression Awareness, Attention  TR      Anticipated Outcomes Item Anticipated Outcome  Self Feeding no goal established on eval  Swallowing  Min assist    Basic self-care  Mod assist   Toileting  Mod assist   Bathroom Transfers Mod assist  Bowel/Bladder  manage bowel and bladder with max assist  Transfers  mod assist  Locomotion  min assist w/c, max assist gait for NMR  Communication  Mod assist   Cognition  Min assist   Pain     Safety/Judgment  maintain safety with min assist   Therapy Plan: PT Intensity: Minimum of 1-2 x/day ,45 to 90 minutes PT Frequency: 5 out of 7 days PT Duration Estimated Length of Stay: 4 weeks OT Intensity: Minimum of 1-2 x/day, 45 to 90 minutes OT Frequency: Total of 15  hours over 7 days of combined therapies OT Duration/Estimated Length of Stay: 3-4 weeks SLP Intensity: Minumum of 1-2 x/day, 30 to 90 minutes SLP Frequency: 3 to 5 out of 7 days SLP Duration/Estimated Length of Stay: 28 days        Team Interventions: Nursing Interventions Patient/Family Education, Disease Management/Prevention, Bladder Management, Psychosocial Support, Bowel Management, Medication Management, Dysphagia/Aspiration Precaution Training  PT interventions Ambulation/gait training, Discharge planning, Functional mobility training, Psychosocial support, Therapeutic Activities, Visual/perceptual remediation/compensation, Wheelchair propulsion/positioning, Therapeutic Exercise, Neuromuscular re-education, Balance/vestibular training, Skin care/wound management, Cognitive remediation/compensation, DME/adaptive equipment instruction, UE/LE Strength taining/ROM, Splinting/orthotics, Functional electrical stimulation, Patient/family education, Stair training, UE/LE Coordination activities  OT Interventions Balance/vestibular training, Cognitive remediation/compensation, Discharge planning, Disease mangement/prevention, DME/adaptive equipment instruction, Functional mobility training, Neuromuscular re-education, Pain management, Patient/family education, Psychosocial support, Self Care/advanced ADL retraining, Skin care/wound managment, Splinting/orthotics, Therapeutic Activities, Therapeutic Exercise, UE/LE Strength taining/ROM, UE/LE Coordination activities, Visual/perceptual remediation/compensation, Wheelchair propulsion/positioning  SLP Interventions Cognitive remediation/compensation, Cueing hierarchy, Dysphagia/aspiration precaution training, Environmental controls, Functional tasks, Internal/external aids, Patient/family education  TR Interventions    SW/CM Interventions Discharge Planning, Psychosocial Support, Patient/Family Education    Team Discharge Planning: Destination: PT-Skilled  Nursing Facility (SNF) ,OT- Skilled Nursing Facility (SNF) , SLP-Skilled Nursing Facility (SNF) Projected Follow-up: PT-Skilled nursing facility, 24 hour supervision/assistance, OT-  Skilled nursing facility, SLP-Home Health SLP, Outpatient SLP, Skilled Nursing facility, 24 hour supervision/assistance Projected Equipment Needs: PT-Other (comment), OT- To be determined, SLP-To be determined Equipment Details: PT-tbd at next level of care, OT-  Patient/family  involved in discharge planning: PT- Patient unable/family or caregiver not available,  OT-Patient unable/family or caregiver not available, SLP-Patient unable/family or caregive not available  MD ELOS: 20-24 days. Medical Rehab Prognosis:  Good Assessment: 75 y.o.right handed malewith history of diabetes mellitus,Remote tobacco and alcohol use, hypertension, hyperlipidemia, chronic kidney disease with creatinine 1.62. Patient independent prior to admission. Presented 09/26/2016 with aphasia and severe right-sided weakness. CT of the brain shows suspect edema within the left insular cortex, anterior temporal lobe and inferior frontal lobe. No hemorrhage. Troponin mildly elevated 0.64. MRI showed left MCA infarct. Multiple other punctate foci of acute ischemia scattered throughout both cerebral and cerebellar hemispheres. MRA of the head and neck showed occlusion of left MCA at the level of the bifurcation. Patient did not receive TPA. Echocardiogram with ejection fraction of 55% no wall motion abnormalities. 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 Neurology consulted presently on aspirin and Plavix for CVA prophylaxis.No plan for TEE or loop recorder. Recommendations are for a 30 day monitor. Patient did receive a urology consult for difficult catheterization with follow-up per Dr. Laverle PatterBorden and placement of Foley tube until patient could attempt voiding trial. Patient was suspect aspiration pneumonia identified on chest x-ray and started on  intravenous Unasyn. Blood cultures negative. He was transitioned to oral Augmentin. Presently maintained on a dysphagia diet. Bouts of agitation and confusion placed on Depakote. Pt with resulting functional deficits with balance, endurance, cognition.  Will set goals for Min A with SLP and Mod A with PT/OT.  See Team Conference Notes for weekly updates to the plan of care

## 2016-10-07 NOTE — Progress Notes (Signed)
Subjective/Complaints: Pt seen laying in bed.  He will only briefly open his eyes.   Review of systems: Unable to obtain secondary to aphasia  Objective: Vital Signs: Blood pressure (!) 127/48, pulse 99, temperature 97.9 F (36.6 C), temperature source Oral, resp. rate 20, SpO2 94 %. Dg Chest 2 View  Result Date: 10/06/2016 CLINICAL DATA:  Dysphagia due to infarct EXAM: CHEST  2 VIEW COMPARISON:  09/29/2016, 09/26/2016 FINDINGS: Low lung volumes. Persistent patchy airspace opacities in the right upper lobe, right perihilar lung and right base. No pleural effusion. Stable mild cardiomegaly. No pneumothorax. IMPRESSION: Grossly unchanged patchy airspace opacities in the right upper lobe and right lower lobe which may represent infiltrates, continued radiographic follow-up to clearing is advised. Stable cardiomegaly. Electronically Signed   By: Donavan Foil M.D.   On: 10/06/2016 18:17   Results for orders placed or performed during the hospital encounter of 10/05/16 (from the past 72 hour(s))  CBC     Status: Abnormal   Collection Time: 10/05/16  6:59 PM  Result Value Ref Range   WBC 12.1 (H) 4.0 - 10.5 K/uL   RBC 3.62 (L) 4.22 - 5.81 MIL/uL   Hemoglobin 8.4 (L) 13.0 - 17.0 g/dL   HCT 28.6 (L) 39.0 - 52.0 %   MCV 79.0 78.0 - 100.0 fL   MCH 23.2 (L) 26.0 - 34.0 pg   MCHC 29.4 (L) 30.0 - 36.0 g/dL   RDW 20.6 (H) 11.5 - 15.5 %   Platelets 131 (L) 150 - 400 K/uL  Creatinine, serum     Status: Abnormal   Collection Time: 10/05/16  6:59 PM  Result Value Ref Range   Creatinine, Ser 1.45 (H) 0.61 - 1.24 mg/dL   GFR calc non Af Amer 46 (L) >60 mL/min   GFR calc Af Amer 53 (L) >60 mL/min    Comment: (NOTE) The eGFR has been calculated using the CKD EPI equation. This calculation has not been validated in all clinical situations. eGFR's persistently <60 mL/min signify possible Chronic Kidney Disease.   Glucose, capillary     Status: Abnormal   Collection Time: 10/05/16  8:57 PM   Result Value Ref Range   Glucose-Capillary 329 (H) 65 - 99 mg/dL  CBC WITH DIFFERENTIAL     Status: Abnormal   Collection Time: 10/06/16  5:54 AM  Result Value Ref Range   WBC 15.0 (H) 4.0 - 10.5 K/uL   RBC 3.46 (L) 4.22 - 5.81 MIL/uL   Hemoglobin 8.2 (L) 13.0 - 17.0 g/dL   HCT 27.0 (L) 39.0 - 52.0 %   MCV 78.0 78.0 - 100.0 fL   MCH 23.7 (L) 26.0 - 34.0 pg   MCHC 30.4 30.0 - 36.0 g/dL   RDW 20.4 (H) 11.5 - 15.5 %   Platelets 122 (L) 150 - 400 K/uL   Neutrophils Relative % 74 %   Neutro Abs 11.3 (H) 1.7 - 7.7 K/uL   Lymphocytes Relative 13 %   Lymphs Abs 1.9 0.7 - 4.0 K/uL   Monocytes Relative 9 %   Monocytes Absolute 1.3 (H) 0.1 - 1.0 K/uL   Eosinophils Relative 3 %   Eosinophils Absolute 0.4 0.0 - 0.7 K/uL   Basophils Relative 1 %   Basophils Absolute 0.1 0.0 - 0.1 K/uL  Comprehensive metabolic panel     Status: Abnormal   Collection Time: 10/06/16  5:54 AM  Result Value Ref Range   Sodium 143 135 - 145 mmol/L   Potassium 3.7 3.5 -  5.1 mmol/L   Chloride 108 101 - 111 mmol/L   CO2 25 22 - 32 mmol/L   Glucose, Bld 197 (H) 65 - 99 mg/dL   BUN 26 (H) 6 - 20 mg/dL   Creatinine, Ser 1.31 (H) 0.61 - 1.24 mg/dL   Calcium 9.4 8.9 - 10.3 mg/dL   Total Protein 6.4 (L) 6.5 - 8.1 g/dL   Albumin 2.4 (L) 3.5 - 5.0 g/dL   AST 64 (H) 15 - 41 U/L   ALT 69 (H) 17 - 63 U/L   Alkaline Phosphatase 315 (H) 38 - 126 U/L   Total Bilirubin 1.0 0.3 - 1.2 mg/dL   GFR calc non Af Amer 52 (L) >60 mL/min   GFR calc Af Amer 60 (L) >60 mL/min    Comment: (NOTE) The eGFR has been calculated using the CKD EPI equation. This calculation has not been validated in all clinical situations. eGFR's persistently <60 mL/min signify possible Chronic Kidney Disease.    Anion gap 10 5 - 15  Glucose, capillary     Status: None   Collection Time: 10/06/16  7:03 AM  Result Value Ref Range   Glucose-Capillary 92 65 - 99 mg/dL  Glucose, capillary     Status: Abnormal   Collection Time: 10/06/16 12:08 PM   Result Value Ref Range   Glucose-Capillary 313 (H) 65 - 99 mg/dL   Comment 1 Notify RN   Glucose, capillary     Status: Abnormal   Collection Time: 10/06/16  5:11 PM  Result Value Ref Range   Glucose-Capillary 375 (H) 65 - 99 mg/dL   Comment 1 Notify RN   Glucose, capillary     Status: Abnormal   Collection Time: 10/06/16  8:49 PM  Result Value Ref Range   Glucose-Capillary 230 (H) 65 - 99 mg/dL   Comment 1 Notify RN   Glucose, capillary     Status: Abnormal   Collection Time: 10/07/16  6:33 AM  Result Value Ref Range   Glucose-Capillary 147 (H) 65 - 99 mg/dL   Comment 1 Notify RN       General: No acute distress. Vital signs reviewed.  Psych: Unable to assess due to aphasia Heart: Regular rate and rhythm no rubs. No JVD. Lungs: Clear to auscultation, breathing unlabored Abdomen: Positive bowel sounds, soft  Skin: Warm and dry. Intact.  Neurologic:  Nonverbal, global aphasia Unable to assess MMT and sensation due to aphasia +Mitts Musculoskeletal: No edema. No tenderness.   Assessment/Plan: 1. 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 Physiatrist is providing close team supervision and 24 hour management of active medical problems listed below. Physiatrist and rehab team continue to assess barriers to discharge/monitor patient progress toward functional and medical goals. FIM: Function - Bathing Position: Wheelchair/chair at sink Body parts bathed by patient: Chest Body parts bathed by helper: Right arm, Left arm, Abdomen, Front perineal area, Buttocks, Right upper leg, Left upper leg, Back Bathing not applicable: Right lower leg, Left lower leg Assist Level: 2 helpers  Function- Upper Body Dressing/Undressing What is the patient wearing?: Hospital gown Assist Level: 2 helpers Function - Lower Body  Dressing/Undressing What is the patient wearing?: Pants, Non-skid slipper socks Position: Wheelchair/chair at sink Pants- Performed by helper: Thread/unthread right pants leg, Thread/unthread left pants leg, Pull pants up/down, Fasten/unfasten pants Non-skid slipper socks- Performed by helper: Don/doff right sock, Don/doff  left sock Assist for footwear: Dependant Assist for lower body dressing: 2 Helpers  Function - Toileting Toileting activity did not occur: No continent bowel/bladder event Toileting steps completed by helper: Adjust clothing prior to toileting, Performs perineal hygiene, Adjust clothing after toileting Assist level: Two helpers  Function - Air cabin crew transfer assistive device: Grab bar Assist level to toilet: 2 helpers Assist level from toilet: 2 helpers  Function - Chair/bed transfer Chair/bed transfer activity did not occur: Safety/medical concerns Chair/bed transfer method: Squat pivot Chair/bed transfer assist level: 2 helpers Chair/bed transfer details: Manual facilitation for weight shifting, Manual facilitation for placement  Function - Locomotion: Wheelchair Wheelchair activity did not occur: Safety/medical concerns Wheel 50 feet with 2 turns activity did not occur: Safety/medical concerns Wheel 150 feet activity did not occur: Safety/medical concerns Function - Locomotion: Ambulation Ambulation activity did not occur: Safety/medical concerns Walk 10 feet activity did not occur: Safety/medical concerns Walk 50 feet with 2 turns activity did not occur: Safety/medical concerns Walk 150 feet activity did not occur: Safety/medical concerns Walk 10 feet on uneven surfaces activity did not occur: Safety/medical concerns  Function - Comprehension Comprehension: Auditory Comprehension assist level: Understands basic 25 - 49% of the time/ requires cueing 50 - 75% of the time  Function - Expression Expression: Verbal Expression assist level:  Expresses basis less than 25% of the time/requires cueing >75% of the time.  Function - Social Interaction Social Interaction assist level: Interacts appropriately 25 - 49% of time - Needs frequent redirection.  Function - Problem Solving Problem solving assist level: Solves basic 25 - 49% of the time - needs direction more than half the time to initiate, plan or complete simple activities  Function - Memory Memory assist level: Recognizes or recalls less than 25% of the time/requires cueing greater than 75% of the time Patient normally able to recall (first 3 days only): That he or she is in a hospital   Medical Problem List and Plan: 1.  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  DVT Prophylaxis/Anticoagulation: Subcutaneous Lovenox. Monitor platelet counts and any signs of bleeding 3. Pain Management: Tylenol as needed 4. Dysphagia. Dysphagia #1 nectar liquids. Monitor for any signs of aspiration. Anded daily at bedtime IV fluids 5. Neuropsych: This patient is capable of making decisions on his own behalf. 6. Skin/Wound Care: Routine skin checks 7. Fluids/Electrolytes/Nutrition: Routine I&Os 8. Aspiration pneumonia. Augmentin 8 doses initiated 10/02/2016. 9. Diabetes mellitus with peripheral neuropathy. Hemoglobin A1c 7.2.   Check blood sugars before meals and at bedtime  Lantus insulin 12 units daily.   Extremely labile, Novolog 4U with meals added 10. Hypertension/tachycardia. Lopressor 12.5 mg twice a day. Monitor heart rate with increased mobility  Controlled 12/23 11. Stage III chronic kidney disease. Baseline creatinine 1.62. Follow-up chemistries  BMP Latest Ref Rng & Units 10/06/2016 10/05/2016 10/04/2016  Glucose 65 - 99 mg/dL 197(H) - 181(H)  BUN 6 - 20 mg/dL 26(H) - 27(H)  Creatinine 0.61 - 1.24 mg/dL 1.31(H) 1.45(H) 1.27(H)  Sodium 135 - 145 mmol/L 143 - 143  Potassium 3.5 - 5.1 mmol/L 3.7 - 3.7  Chloride 101 - 111 mmol/L  108 - 109  CO2 22 - 32 mmol/L 25 - 21(L)  Calcium 8.9 - 10.3 mg/dL 9.4 - 9.5   12. Mood. Depakote 250 mg BID added for bouts of agitation and restlessness. 13. Hypothyroidism. Synthroid 14. Hyperlipidemia. Lipitor 15. Anemia.   Hb 8.2 on 12/22  Hemocult ordered  Labs ordered for tomorrow 16.  Leukocytosis:  Afebrile   CXR reviewed 12/22 with stable infiltrates, cont to monitor  WBCs 15/0 on 12/22  Will d/c foley, order UA/Ucx  PVRs ordered 17. Thrombocytopenia:   Plts 122 on 12/22  Will order labs for tomorrow 18. Hypoalbuminemia  Supplement started 12/23  LOS (Days) 2 A FACE TO FACE EVALUATION WAS PERFORMED  Julianna Vanwagner Lorie Phenix 10/07/2016, 9:46 AM

## 2016-10-07 NOTE — Progress Notes (Signed)
Speech Language Pathology Daily Session Note  Patient Details  Name: Osborn CohoJames L Otero MRN: [id number] Date of Birth: 11/27/1940  Today's Date: 10/07/2016 SLP Individual Time: 1115-1200 SLP Individual Time Calculation (min): 45 min   Short Term Goals:Week 1: SLP Short Term Goal 1 (Week 1): Pt will answer yes/no questions for 75% accuracy with max assist multimodal cues.   SLP Short Term Goal 2 (Week 1): Pt will follow 1 step commands for 75% accuracy with max assist multimodal cues.   SLP Short Term Goal 3 (Week 1): Pt will consume dys 1 textures and nectar thick liquids with mod assist for use of swallowing precautions and minimal overt s/s of aspiration.   SLP Short Term Goal 4 (Week 1): Pt will verbalize at the word level during structured tasks for 50% accuracy with max assist multimodal cues. SLP Short Term Goal 5 (Week 1): Pt will utilize multimodal means of communication to convey needs and wants to caregivers with max assist cues.    Skilled Therapeutic Interventions: Pt was seen for skilled ST targeting goals for dysphagia and communication.  Pt was able to identify object when named from a field of 2 with max-total assist multimodal cues (including written visual aids) in 3 out of 5 opportunities.  Pt was able to answer yes/no questions to convey immediate needs/wants to therapist in ~50% of opportunities with max assist multimodal cues for attention.  Pt's verbal expression remains limited to unintelligible jargon of which pt has no awareness.  Pt demonstrated improved alertness during today's therapy session and was able to sustain his attention to tasks for ~30 seconds before needed max assist multimodal cues for redirection.  Pt consumed dys 1 textures and nectar thick liquids with hand over hand assist needed for initiation and sequencing of self feeding.  Pt demonstrated multiple swallows following large boluses of nectar thick liquids with x1 immediate cough.  No overt s/s of  aspiration were evident with smaller boluses.  Pt was left in wheelchair at nursing station with quick release belt donned for safety . Continue per current plan of care.       Function:  Eating Eating   Modified Consistency Diet: Yes Eating Assist Level: Help with picking up utensils;Help managing cup/glass;Hand over hand assist         Cognition Comprehension Comprehension assist level: Understands basic 25 - 49% of the time/ requires cueing 50 - 75% of the time  Expression   Expression assist level: Expresses basis less than 25% of the time/requires cueing >75% of the time.  Social Interaction Social Interaction assist level: Interacts appropriately 25 - 49% of time - Needs frequent redirection.  Problem Solving Problem solving assist level: Solves basic 25 - 49% of the time - needs direction more than half the time to initiate, plan or complete simple activities  Memory Memory assist level: Recognizes or recalls less than 25% of the time/requires cueing greater than 75% of the time    Pain Pain Assessment Pain Assessment: No/denies pain   Therapy/Group: Individual Therapy  Danahi Reddish, Melanee SpryNicole L 10/07/2016, 11:56 AM

## 2016-10-07 NOTE — Progress Notes (Signed)
Physical Therapy Session Note  Patient Details  Name: Douglas [name] MRN: [id number] Date of Birth: 05/13/1941  Today's Date: 10/07/2016    Skilled Therapeutic Interventions/Progress Updates:  Session I 1000-1100 (60 min )   Patient in bed, difficult to awake, cold wash cloth used, no reaction, gross motor movement of LE( PNF)pattern were able to awake patient. Supine to sit EOB with max A of one and transfer to w/c with max A of one but second person held w/c and was necessary to assure safety. Patient with a very strong R side leaning,  And push. Reclining back w/c issued, might benefit more from tilt in space with side support.  Attempted standing with side rails on L side max to total dependence due to push.  Standing frame x 7 min with max A of two to position. Patient was able to correct his posture and raise head up when cued, not able to maintain position.  Patient placed in w/c , and ;left at nurses station with QR belt on.   Session II 1345-1415 (45min )  Patient maintained sitting in w/c from last session, due to fatigue transferred to bed with max A, able to self reposition sitting EOB, with e verbal cues can shift weight, continues with strong R side push, but engages R UE to bear weight and maintain balance.  Sitting EOB x 10 min with manual weight displacement outside BOS-able to correct posture w/o assistance 30% of time. Active ROM exercises sitting EOB, able to perform with R LE. Patient performed transfer to supine with mod A. Immediately after laying down, falls, asleep. Bed alarm on .   Therapy Documentation Precautions:  Precautions Precautions: Fall Precaution Comments: R side inattention, strong push to R side, posterior lean Restrictions Weight Bearing Restrictions: No    Therapy/Group: Individual Therapy  Dorna MaiCzajkowska, [name] 10/07/2016, 12:49 PM

## 2016-10-07 NOTE — Progress Notes (Signed)
Occupational Therapy Session Note  Patient Details  Name: Douglas [name] MRN: [id number] Date of Birth: 09/27/1941  Today's Date: 10/07/2016 OT Individual Time: 0812-0925 OT Individual Time Calculation (min): 73 min     Short Term Goals: Week 1:  OT Short Term Goal 1 (Week 1): Pt will maintain unsupported sitting balance with min assist for 2 mins to engage in self-care task OT Short Term Goal 2 (Week 1): Pt will transfer to toilet with max assist of 1 caregiver OT Short Term Goal 3 (Week 1): Pt will complete sit > stand with max assist of one caregiver to progress towards LB dressing at sit > stand level OT Short Term Goal 4 (Week 1): Pt will complete LB dressing with max assist  OT Short Term Goal 5 (Week 1): Pt will complete 2 grooming tasks with < mod verbal cues due to ideational apraxia  Skilled Therapeutic Interventions/Progress Updates:   Nurse tech setting patient up for breakfast upon arrival, but patient sleeping and did not rouse and attend to eat.   This clinician applied a cold wash cloth to his face and head and asked introduced self andasked patien to wash face.   He attempted to use bilateral upper extremities for the task in between his lethargy.  He required min A for thoroughness and task completion.   Patient was able to complete donning shirt with max A even though he was lethargic and required contact tactile or verbal or cold wash cloth applied to head cues to stay aroused.  He required total A X2 for bed mobility as  Well as brief change and donning pants in bed.  At end of session, this clinician attempted to assist patient with hand on right hand self feeding, scooping food onto utensil and hand to mouth.   He was able to attend with tactile or verbal cues to chew and swallow about 80 % of his bites of food.   He stated he did not want any more after eating approximately 5% of his food and drinking 90% of his nectar thick orange juice.  He brushed his teeth (with  minimal tooth paste or water applied) with hand over hand assist to manage elbow and complete grasp hold onto his toothbrush (right hand).   He was not able to follow the command to only swish with the water.  Instead he attempted to swallow the water.   He exhibited no signs of aspiration or coughing during this task or rest of the session.  At the end of the session, Patient was left positioned in his bed sleeping with bed alarm and cal bell in place.  Therapy Documentation Precautions:  Precautions Precautions: Fall Precaution Comments: R side inattention, strong push to R side, posterior lean Restrictions Weight Bearing Restrictions: No  Pain: Pain Assessment Pain Assessment: Faces Faces Pain Scale: No hurt  See Function Navigator for Current Functional Status.   Therapy/Group: Individual Therapy  [name], Kaia Dep[place]olis [facility] Falls[facility] 10/07/2016, 9:58 AM

## 2016-10-08 ENCOUNTER — Inpatient Hospital Stay (HOSPITAL_COMMUNITY): Payer: Medicare Other | Admitting: Physical Therapy

## 2016-10-08 ENCOUNTER — Inpatient Hospital Stay (HOSPITAL_COMMUNITY): Payer: Medicare Other

## 2016-10-08 ENCOUNTER — Inpatient Hospital Stay (HOSPITAL_COMMUNITY): Payer: Medicare Other | Admitting: Speech Pathology

## 2016-10-08 DIAGNOSIS — N390 Urinary tract infection, site not specified: Secondary | ICD-10-CM

## 2016-10-08 LAB — GLUCOSE, CAPILLARY
GLUCOSE-CAPILLARY: 208 mg/dL — AB (ref 65–99)
GLUCOSE-CAPILLARY: 332 mg/dL — AB (ref 65–99)
Glucose-Capillary: 282 mg/dL — ABNORMAL HIGH (ref 65–99)
Glucose-Capillary: 271 mg/dL — ABNORMAL HIGH (ref 65–99)

## 2016-10-08 LAB — CBC WITH DIFFERENTIAL/PLATELET
BASOS ABS: 0.1 10*3/uL (ref 0.0–0.1)
Basophils Relative: 1 %
EOS ABS: 0.3 10*3/uL (ref 0.0–0.7)
Eosinophils Relative: 2 %
HCT: 23.8 % — ABNORMAL LOW (ref 39.0–52.0)
Hemoglobin: 7.2 g/dL — ABNORMAL LOW (ref 13.0–17.0)
LYMPHS ABS: 1.6 10*3/uL (ref 0.7–4.0)
Lymphocytes Relative: 12 %
MCH: 23.8 pg — ABNORMAL LOW (ref 26.0–34.0)
MCHC: 30.3 g/dL (ref 30.0–36.0)
MCV: 78.5 fL (ref 78.0–100.0)
MONOS PCT: 10 %
Monocytes Absolute: 1.4 10*3/uL — ABNORMAL HIGH (ref 0.1–1.0)
Neutro Abs: 10.2 10*3/uL — ABNORMAL HIGH (ref 1.7–7.7)
Neutrophils Relative %: 75 %
PLATELETS: 78 10*3/uL — AB (ref 150–400)
RBC: 3.03 MIL/uL — AB (ref 4.22–5.81)
RDW: 20.8 % — ABNORMAL HIGH (ref 11.5–15.5)
WBC: 13.6 10*3/uL — AB (ref 4.0–10.5)

## 2016-10-08 MED ORDER — CIPROFLOXACIN IN D5W 400 MG/200ML IV SOLN
400.0000 mg | Freq: Once | INTRAVENOUS | Status: AC
  Administered 2016-10-08: 400 mg via INTRAVENOUS
  Filled 2016-10-08: qty 200

## 2016-10-08 MED ORDER — CIPROFLOXACIN HCL 500 MG PO TABS
500.0000 mg | ORAL_TABLET | Freq: Two times a day (BID) | ORAL | Status: DC
  Administered 2016-10-08 – 2016-10-11 (×7): 500 mg via ORAL
  Filled 2016-10-08 (×7): qty 1

## 2016-10-08 NOTE — Progress Notes (Signed)
Speech Language Pathology Daily Session Note  Patient Details  Name: Douglas CohoJames L Urschel MRN: [id number] Date of Birth: 05/10/1941  Today's Date: 10/08/2016 SLP Individual Time: [id number] SLP Individual Time Calculation (min): 58 min   Short Term Goals:Week 1: SLP Short Term Goal 1 (Week 1): Pt will answer yes/no questions for 75% accuracy with max assist multimodal cues.   SLP Short Term Goal 2 (Week 1): Pt will follow 1 step commands for 75% accuracy with max assist multimodal cues.   SLP Short Term Goal 3 (Week 1): Pt will consume dys 1 textures and nectar thick liquids with mod assist for use of swallowing precautions and minimal overt s/s of aspiration.   SLP Short Term Goal 4 (Week 1): Pt will verbalize at the word level during structured tasks for 50% accuracy with max assist multimodal cues. SLP Short Term Goal 5 (Week 1): Pt will utilize multimodal means of communication to convey needs and wants to caregivers with max assist cues.    Skilled Therapeutic Interventions: Skilled treatment session focused on addressing communication and swallow goals. Upon SLP arrival patient was consuming medications crushed in puree followed by liquid cup sips with RN with no observable s/s of aspiration. RN reported that patient had consumed breakfast earlier.  As a result, SLP facilitated session with set-up and Max faded to Mod assist verbal and tactile cues to complete through oral care via suctioning.  Patient with moderate buccal residue; recommend oral care following all meals and RN notified.  Patient was able to answer yes/no questions with combination of head nods and verbal expression with biographical and immediate needs/wants with 50% accuracy.  Visual choice of two was helpful when appropriate to indicate drink preferences.  However, at end of session patient with oral holding 60 seconds +, swishing, and oral expectoration despite Max assist multimodal cues for swallow, RN made aware.  Suspect  decline in swallow was related to fatigue and continue to recommend current diet with precautions to maximize safety.    Function:  Eating Eating   Modified Consistency Diet: Yes Eating Assist Level: Set up assist for;Supervision or verbal cues;Helper checks for pocketed food;Help managing cup/glass   Eating Set Up Assist For: Opening containers       Cognition Comprehension Comprehension assist level: Understands basic 25 - 49% of the time/ requires cueing 50 - 75% of the time  Expression   Expression assist level: Expresses basis less than 25% of the time/requires cueing >75% of the time.  Social Interaction Social Interaction assist level: Interacts appropriately 25 - 49% of time - Needs frequent redirection.  Problem Solving Problem solving assist level: Solves basic 25 - 49% of the time - needs direction more than half the time to initiate, plan or complete simple activities  Memory Memory assist level: Recognizes or recalls less than 25% of the time/requires cueing greater than 75% of the time    Pain Pain Assessment Pain Assessment: No/denies pain  Therapy/Group: Individual Therapy  Charlane FerrettiMelissa Natale Barba, M.A., CCC-SLP [phone]  Brelan Hannen 10/08/2016, 9:26 AM

## 2016-10-08 NOTE — Progress Notes (Signed)
Foley dc'd at 1500, per report. No void in 8 hours. Staff unable to I & O cath due to foreskin unable to retract. Purulent drainage and blood from meatus. Temp. 101.8 AX- Paged Dr. Allena KatzPatel,  Foley cath placed, UA C& S sent per orders. Initial urine return milky in appearance, with a lot of sediment. Patient non verbal. HS IVF's infusing. Douglas [name], Ociel Retherford A

## 2016-10-08 NOTE — Progress Notes (Signed)
Occupational Therapy Session Note  Patient Details  Name: Douglas [name] MRN: [id number] Date of Birth: 11/07/1940  Today's Date: 10/08/2016 OT Individual Time: 1257-1413 OT Individual Time Calculation (min): 76 min    Short Term Goals: Week 1:  OT Short Term Goal 1 (Week 1): Pt will maintain unsupported sitting balance with min assist for 2 mins to engage in self-care task OT Short Term Goal 2 (Week 1): Pt will transfer to toilet with max assist of 1 caregiver OT Short Term Goal 3 (Week 1): Pt will complete sit > stand with max assist of one caregiver to progress towards LB dressing at sit > stand level OT Short Term Goal 4 (Week 1): Pt will complete LB dressing with max assist  OT Short Term Goal 5 (Week 1): Pt will complete 2 grooming tasks with < mod verbal cues due to ideational apraxia  Skilled Therapeutic Interventions/Progress Updates:   Pt was sitting up in w/c with safety belt donned at time of arrival. Tx focus on R UE NMR/bilateral integration, midline orientation, unsupported sitting balance, attention, and following 1 step instructions. Pt was taken to dayroom and completed 1 step cone stacking task with [facility] for bilateral integrating UEs and stacking cone at midline, progressing to right side. Pt able to complete task x3 occasions without HOH and both UEs. Max tactile/auditory cues for visually scanning items on right side. Pt was also provided with yellow resistive sponge for R UE strengthening; pt required max cues for not using L UE. Pt unable to follow counted repetitions. Plastic shooter then used for bilateral integration with pt exhibiting increased alertness/familiarity with use, able to shoot 2 balls but missing targets. 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 For remainder of session, pt [facility] with visual biofeedback for R NMR, postural awareness, and sitting balance. Pt used bilateral UEs throughout task, multimodal cues  provided to reduce right pushing/lean. Mod-Max A for upright balance, forward gaze and midline orientation improved with use of mirror. In time, pt able to engage UEs in ball toss while sitting unsupported for 1 minute. Tossing progressed from midline to right side to promote scanning, with pt requiring max cues to attend to right. Afterwards pt was transferred back to chair via squat pivot and 2 helpers (also the method he was transferred to mat from w/c). He was taken back to room, safety belt donned and R UE elevated with pillows due to half lap tray unable to fit due to protruding piece of w/c armrest. All needs within reach at time of departure and friend present.     Therapy Documentation Precautions:  Precautions Precautions: Fall Precaution Comments: R side inattention, strong push to R side, posterior lean Restrictions Weight Bearing Restrictions: No   Vital Signs: Therapy Vitals Temp: 97.5 F (36.4 C) Temp Source: Oral Pulse Rate: 93 Resp: 16 BP: (!) 129/45 Patient Position (if appropriate): Sitting Oxygen Therapy SpO2: 100 % O2 Device: Not Delivered Pain: No c/o or expressions of pain during session   ADL:      See Function Navigator for Current Functional Status.   Therapy/Group: Individual Therapy  Douglas [name] 10/08/2016, 4:33 PM

## 2016-10-08 NOTE — Progress Notes (Addendum)
Subjective/Complaints: Pt sitting up in bed this AM.  He is more alert, but remains nonverbal.  Per nursing, pt with increased lethargy overnight then restlessness and fever. He had purulent drainage from penis.   Review of systems: Unable to obtain secondary to aphasia  Objective: Vital Signs: Blood pressure (!) 144/47, pulse 92, temperature 97.9 F (36.6 C), temperature source Oral, resp. rate 16, SpO2 90 %. Dg Chest 2 View  Result Date: 10/06/2016 CLINICAL DATA:  Dysphagia due to infarct EXAM: CHEST  2 VIEW COMPARISON:  09/29/2016, 09/26/2016 FINDINGS: Low lung volumes. Persistent patchy airspace opacities in the right upper lobe, right perihilar lung and right base. No pleural effusion. Stable mild cardiomegaly. No pneumothorax. IMPRESSION: Grossly unchanged patchy airspace opacities in the right upper lobe and right lower lobe which may represent infiltrates, continued radiographic follow-up to clearing is advised. Stable cardiomegaly. Electronically Signed   By: Donavan Foil M.D.   On: 10/06/2016 18:17   Results for orders placed or performed during the hospital encounter of 10/05/16 (from the past 72 hour(s))  CBC     Status: Abnormal   Collection Time: 10/05/16  6:59 PM  Result Value Ref Range   WBC 12.1 (H) 4.0 - 10.5 K/uL   RBC 3.62 (L) 4.22 - 5.81 MIL/uL   Hemoglobin 8.4 (L) 13.0 - 17.0 g/dL   HCT 28.6 (L) 39.0 - 52.0 %   MCV 79.0 78.0 - 100.0 fL   MCH 23.2 (L) 26.0 - 34.0 pg   MCHC 29.4 (L) 30.0 - 36.0 g/dL   RDW 20.6 (H) 11.5 - 15.5 %   Platelets 131 (L) 150 - 400 K/uL  Creatinine, serum     Status: Abnormal   Collection Time: 10/05/16  6:59 PM  Result Value Ref Range   Creatinine, Ser 1.45 (H) 0.61 - 1.24 mg/dL   GFR calc non Af Amer 46 (L) >60 mL/min   GFR calc Af Amer 53 (L) >60 mL/min    Comment: (NOTE) The eGFR has been calculated using the CKD EPI equation. This calculation has not been validated in all clinical situations. eGFR's persistently <60 mL/min  signify possible Chronic Kidney Disease.   Glucose, capillary     Status: Abnormal   Collection Time: 10/05/16  8:57 PM  Result Value Ref Range   Glucose-Capillary 329 (H) 65 - 99 mg/dL  CBC WITH DIFFERENTIAL     Status: Abnormal   Collection Time: 10/06/16  5:54 AM  Result Value Ref Range   WBC 15.0 (H) 4.0 - 10.5 K/uL   RBC 3.46 (L) 4.22 - 5.81 MIL/uL   Hemoglobin 8.2 (L) 13.0 - 17.0 g/dL   HCT 27.0 (L) 39.0 - 52.0 %   MCV 78.0 78.0 - 100.0 fL   MCH 23.7 (L) 26.0 - 34.0 pg   MCHC 30.4 30.0 - 36.0 g/dL   RDW 20.4 (H) 11.5 - 15.5 %   Platelets 122 (L) 150 - 400 K/uL   Neutrophils Relative % 74 %   Neutro Abs 11.3 (H) 1.7 - 7.7 K/uL   Lymphocytes Relative 13 %   Lymphs Abs 1.9 0.7 - 4.0 K/uL   Monocytes Relative 9 %   Monocytes Absolute 1.3 (H) 0.1 - 1.0 K/uL   Eosinophils Relative 3 %   Eosinophils Absolute 0.4 0.0 - 0.7 K/uL   Basophils Relative 1 %   Basophils Absolute 0.1 0.0 - 0.1 K/uL  Comprehensive metabolic panel     Status: Abnormal   Collection Time: 10/06/16  5:54  AM  Result Value Ref Range   Sodium 143 135 - 145 mmol/L   Potassium 3.7 3.5 - 5.1 mmol/L   Chloride 108 101 - 111 mmol/L   CO2 25 22 - 32 mmol/L   Glucose, Bld 197 (H) 65 - 99 mg/dL   BUN 26 (H) 6 - 20 mg/dL   Creatinine, Ser 1.31 (H) 0.61 - 1.24 mg/dL   Calcium 9.4 8.9 - 10.3 mg/dL   Total Protein 6.4 (L) 6.5 - 8.1 g/dL   Albumin 2.4 (L) 3.5 - 5.0 g/dL   AST 64 (H) 15 - 41 U/L   ALT 69 (H) 17 - 63 U/L   Alkaline Phosphatase 315 (H) 38 - 126 U/L   Total Bilirubin 1.0 0.3 - 1.2 mg/dL   GFR calc non Af Amer 52 (L) >60 mL/min   GFR calc Af Amer 60 (L) >60 mL/min    Comment: (NOTE) The eGFR has been calculated using the CKD EPI equation. This calculation has not been validated in all clinical situations. eGFR's persistently <60 mL/min signify possible Chronic Kidney Disease.    Anion gap 10 5 - 15  Glucose, capillary     Status: None   Collection Time: 10/06/16  7:03 AM  Result Value Ref Range    Glucose-Capillary 92 65 - 99 mg/dL  Glucose, capillary     Status: Abnormal   Collection Time: 10/06/16 12:08 PM  Result Value Ref Range   Glucose-Capillary 313 (H) 65 - 99 mg/dL   Comment 1 Notify RN   Glucose, capillary     Status: Abnormal   Collection Time: 10/06/16  5:11 PM  Result Value Ref Range   Glucose-Capillary 375 (H) 65 - 99 mg/dL   Comment 1 Notify RN   Glucose, capillary     Status: Abnormal   Collection Time: 10/06/16  8:49 PM  Result Value Ref Range   Glucose-Capillary 230 (H) 65 - 99 mg/dL   Comment 1 Notify RN   Glucose, capillary     Status: Abnormal   Collection Time: 10/07/16  6:33 AM  Result Value Ref Range   Glucose-Capillary 147 (H) 65 - 99 mg/dL   Comment 1 Notify RN   Glucose, capillary     Status: Abnormal   Collection Time: 10/07/16 12:05 PM  Result Value Ref Range   Glucose-Capillary 293 (H) 65 - 99 mg/dL   Comment 1 Notify RN   Glucose, capillary     Status: Abnormal   Collection Time: 10/07/16  5:14 PM  Result Value Ref Range   Glucose-Capillary 240 (H) 65 - 99 mg/dL  Occult blood card to lab, stool     Status: None   Collection Time: 10/07/16  5:57 PM  Result Value Ref Range   Fecal Occult Bld NEGATIVE NEGATIVE  Glucose, capillary     Status: Abnormal   Collection Time: 10/07/16  8:35 PM  Result Value Ref Range   Glucose-Capillary 302 (H) 65 - 99 mg/dL   Comment 1 Notify RN   Urinalysis, Routine w reflex microscopic     Status: Abnormal   Collection Time: 10/07/16 11:30 PM  Result Value Ref Range   Color, Urine YELLOW YELLOW   APPearance HAZY (A) CLEAR   Specific Gravity, Urine 1.016 1.005 - 1.030   pH 5.0 5.0 - 8.0   Glucose, UA >=500 (A) NEGATIVE mg/dL   Hgb urine dipstick MODERATE (A) NEGATIVE   Bilirubin Urine NEGATIVE NEGATIVE   Ketones, ur NEGATIVE NEGATIVE mg/dL   Protein,  ur 30 (A) NEGATIVE mg/dL   Nitrite NEGATIVE NEGATIVE   Leukocytes, UA LARGE (A) NEGATIVE   RBC / HPF TOO NUMEROUS TO COUNT 0 - 5 RBC/hpf   WBC, UA TOO  NUMEROUS TO COUNT 0 - 5 WBC/hpf   Bacteria, UA RARE (A) NONE SEEN   Squamous Epithelial / LPF NONE SEEN NONE SEEN   WBC Clumps PRESENT   CBC with Differential/Platelet     Status: Abnormal (Preliminary result)   Collection Time: 10/08/16  5:42 AM  Result Value Ref Range   WBC 13.6 (H) 4.0 - 10.5 K/uL   RBC 3.03 (L) 4.22 - 5.81 MIL/uL   Hemoglobin 7.2 (L) 13.0 - 17.0 g/dL   HCT 23.8 (L) 39.0 - 52.0 %   MCV 78.5 78.0 - 100.0 fL   MCH 23.8 (L) 26.0 - 34.0 pg   MCHC 30.3 30.0 - 36.0 g/dL   RDW 20.8 (H) 11.5 - 15.5 %   Platelets 78 (L) 150 - 400 K/uL    Comment: SPECIMEN CHECKED FOR CLOTS REPEATED TO VERIFY PLATELET COUNT CONFIRMED BY SMEAR    Neutrophils Relative % PENDING %   Neutro Abs PENDING 1.7 - 7.7 K/uL   Band Neutrophils PENDING %   Lymphocytes Relative PENDING %   Lymphs Abs PENDING 0.7 - 4.0 K/uL   Monocytes Relative PENDING %   Monocytes Absolute PENDING 0.1 - 1.0 K/uL   Eosinophils Relative PENDING %   Eosinophils Absolute PENDING 0.0 - 0.7 K/uL   Basophils Relative PENDING %   Basophils Absolute PENDING 0.0 - 0.1 K/uL   WBC Morphology PENDING    RBC Morphology PENDING    Smear Review PENDING    nRBC PENDING 0 /100 WBC   Metamyelocytes Relative PENDING %   Myelocytes PENDING %   Promyelocytes Absolute PENDING %   Blasts PENDING %  Glucose, capillary     Status: Abnormal   Collection Time: 10/08/16  6:37 AM  Result Value Ref Range   Glucose-Capillary 208 (H) 65 - 99 mg/dL      General: NAD. Vital signs reviewed.  Psych: Unable to assess due to aphasia Heart: RRR. No JVD. Lungs: +Ronchi. Unlabored Abdomen: Positive bowel sounds, soft  Skin: Warm and dry. Intact.  Neurologic:  Alert Inconsistently following commands, moving b/l UE. Nonverbal, global aphasia Unable to assess MMT and sensation due to aphasia Musculoskeletal: No edema. No tenderness.   Assessment/Plan: 1. 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 Physiatrist is providing close team supervision and 24 hour management of active medical problems listed below. Physiatrist and rehab team continue to assess barriers to discharge/monitor patient progress toward functional and medical goals. FIM: Function - Bathing Position: Wheelchair/chair at sink Body parts bathed by patient: Chest Body parts bathed by helper: Right arm, Left arm, Abdomen, Front perineal area, Buttocks, Right upper leg, Left upper leg, Back Bathing not applicable: Right lower leg, Left lower leg Assist Level: 2 helpers  Function- Upper Body Dressing/Undressing What is the patient wearing?: Hospital gown Assist Level: 2 helpers Function - Lower Body Dressing/Undressing What is the patient wearing?:  (surgical pants) Position: Wheelchair/chair at sink Pants- Performed by helper: Thread/unthread right pants leg, Pull pants up/down, Fasten/unfasten pants, Thread/unthread left pants leg Non-skid slipper socks- Performed by helper: Don/doff right sock, Don/doff left sock Assist for footwear: Dependant Assist for lower body dressing: 2 Helpers  Function - Toileting Toileting activity did not occur: No continent bowel/bladder event Toileting steps completed by helper: Adjust clothing prior to toileting, Performs perineal hygiene, Adjust clothing after toileting Assist level: Two helpers  Function - Air cabin crew transfer assistive device: Grab bar Assist level to toilet: 2 helpers Assist level from toilet: 2 helpers  Function - Chair/bed transfer Chair/bed transfer activity did not occur: Safety/medical concerns Chair/bed transfer method: Squat pivot Chair/bed transfer assist level: 2 helpers Chair/bed transfer details: Manual facilitation for weight shifting, Manual facilitation for placement  Function - Locomotion: Wheelchair Wheelchair  activity did not occur: Safety/medical concerns Wheel 50 feet with 2 turns activity did not occur: Safety/medical concerns Wheel 150 feet activity did not occur: Safety/medical concerns Function - Locomotion: Ambulation Ambulation activity did not occur: Safety/medical concerns Walk 10 feet activity did not occur: Safety/medical concerns Walk 50 feet with 2 turns activity did not occur: Safety/medical concerns Walk 150 feet activity did not occur: Safety/medical concerns Walk 10 feet on uneven surfaces activity did not occur: Safety/medical concerns  Function - Comprehension Comprehension: Auditory Comprehension assist level: Understands basic 25 - 49% of the time/ requires cueing 50 - 75% of the time  Function - Expression Expression: Verbal Expression assist level: Expresses basis less than 25% of the time/requires cueing >75% of the time.  Function - Social Interaction Social Interaction assist level: Interacts appropriately 25 - 49% of time - Needs frequent redirection.  Function - Problem Solving Problem solving assist level: Solves basic 25 - 49% of the time - needs direction more than half the time to initiate, plan or complete simple activities  Function - Memory Memory assist level: Recognizes or recalls less than 25% of the time/requires cueing greater than 75% of the time Patient normally able to recall (first 3 days only): That he or she is in a hospital   Medical Problem List and Plan: 1.  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  DVT Prophylaxis/Anticoagulation: Subcutaneous Lovenox, will hold.  Monitor platelet counts and any signs of bleeding  SCDs ordered 3. Pain Management: Tylenol as needed 4. Dysphagia. Dysphagia #1 nectar liquids. Monitor for any signs of aspiration.   Added daily at bedtime IV fluids 5. Neuropsych: This patient is capable of making decisions on his own behalf. 6. Skin/Wound Care: Routine skin  checks 7. Fluids/Electrolytes/Nutrition: Routine I&Os 8. Aspiration pneumonia. Augmentin 8 doses initiated 10/02/2016. 9. Diabetes mellitus with peripheral neuropathy. Hemoglobin A1c 7.2.   Check blood sugars before meals and at bedtime  Lantus insulin 12 units daily.   Novolog 4U with meals added 12/23  Continues to be labile, with elevations >300 10. Hypertension/tachycardia. Lopressor 12.5 mg twice a day. Monitor heart rate with increased mobility  Slightly elevated 12/24 11. Stage III chronic kidney disease. Baseline creatinine 1.62. Follow-up chemistries  BMP Latest Ref Rng & Units 10/06/2016 10/05/2016 10/04/2016  Glucose 65 - 99 mg/dL 197(H) - 181(H)  BUN 6 - 20 mg/dL 26(H) - 27(H)  Creatinine 0.61 - 1.24 mg/dL 1.31(H) 1.45(H) 1.27(H)  Sodium 135 - 145 mmol/L 143 - 143  Potassium 3.5 - 5.1 mmol/L 3.7 - 3.7  Chloride 101 - 111 mmol/L 108 - 109  CO2 22 - 32 mmol/L 25 - 21(L)  Calcium 8.9 - 10.3 mg/dL 9.4 - 9.5   12. Mood. Depakote 250 mg BID added for bouts of agitation and restlessness. 13. Hypothyroidism. Synthroid 14. Hyperlipidemia. Lipitor 15. Anemia, acute blood loss.   Hb 7.2 on  12/24  Hemocult neg  Cont to monitor 16.  Leukocytosis:  Febrile 12/23  CXR reviewed 12/22 with stable infiltrates, cont to monitor  WBCs 13.6 on 12/24  Foley replaced  UA+, Ucx pending, likely urinary source 17. Thrombocytopenia:   Plts 78 on 12/24, Lovenox held  SRA ordered  Will cont to monitor 18. Hypoalbuminemia  Supplement started 12/23 19. Acute lower UTI  Foley to stay in place, difficult placement with tight foreskin.  Will consider Urology consult  Cipro started 12/24 for UTI +/- PNA  LOS (Days) 3 A FACE TO FACE EVALUATION WAS PERFORMED  Douglas [name] 10/08/2016, 8:44 AM

## 2016-10-08 NOTE — Progress Notes (Signed)
At 0400, Attempting to get OOB without assistance. Bed alarm alerted staff. Pulling at IV tubing, bilateral mittens applied. Alfredo MartinezMurray, Bani Gianfrancesco A

## 2016-10-08 NOTE — Progress Notes (Signed)
Occupational Therapy Session Note  Patient Details  Name: Douglas [name] MRN: [id number] Date of Birth: 05/06/1941  Today's Date: 10/08/2016 OT Individual Time: [id number] OT Individual Time Calculation (min): 60 min   Short Term Goals: Week 1:  OT Short Term Goal 1 (Week 1): Pt will maintain unsupported sitting balance with min assist for 2 mins to engage in self-care task OT Short Term Goal 2 (Week 1): Pt will transfer to toilet with max assist of 1 caregiver OT Short Term Goal 3 (Week 1): Pt will complete sit > stand with max assist of one caregiver to progress towards LB dressing at sit > stand level OT Short Term Goal 4 (Week 1): Pt will complete LB dressing with max assist  OT Short Term Goal 5 (Week 1): Pt will complete 2 grooming tasks with < mod verbal cues due to ideational apraxia  Skilled Therapeutic Interventions/Progress Updates:   ADL-retraining at bed level with focus on improved attention, bed mobility, NMR of RUE, and adapted bathing/dressing skills.  Pt received supine in bed, alert and responsive to therapist.   Pt attempts verbalizations during active participation in treatment and responds to simple cues appropriately >75% of the time.   Pt educated on plan to bathe and dress lower body in bed and transfer to w/c for upper body B&D at sink.   Pt follows cues for bed mobility and only requires mod assist X 1 to manage legs as OT washes his buttocks soiled by BM in brief.   Pt was able to reach his periarea, stomach and lower legs but lower body B&D required extra time d/t need for contact with RN to alert to wound at left foot (great toe and dorsum of foot).   Pt required max assist to rise to sit at EOB and steadying assist to maintain sitting balance but performed sit<>stand and stand pivot transfer to w/c with only max A of 1, +2 helper standing by for safety and to stabilize w/c.   Pt required max assist to reposition in w/c and maintain midline orientation and then groomed  at sink with max assist for safety (to shave).  Pt  donned his shirt with only mod assist to pull head through and shirt over his back.   During session pt demonstrates ability to incorporate RUE throughout session, limited by weak grip strength.    Pt setup in w/c with QRB and call light within reach as guest arrived to visit.  Therapy Documentation Precautions:  Precautions Precautions: Fall Precaution Comments: R side inattention, strong push to R side, posterior lean Restrictions Weight Bearing Restrictions: No  Pain: Pain Assessment Pain Assessment: No/denies pain  See Function Navigator for Current Functional Status.   Therapy/Group: Individual Therapy  Tiant Peixoto 10/08/2016, 12:39 PM

## 2016-10-08 NOTE — Progress Notes (Signed)
Pharmacy Antibiotic Note  Douglas [name] is a 75 y.o. male admitted to Rehab s/p CVA on 10/05/2016, now with UTI.  Pharmacy has been consulted for Cipro dosing.  Plan: Cipro 400 mg IV now, then 500 mg po BID x 7 days Will sign off, follow up any positive urine cultures    Temp (24hrs), Avg:99.1 F (37.3 C), Min:97.5 F (36.4 C), Max:101.8 F (38.8 C)   Recent Labs Lab 10/01/16 0652 10/02/16 0533 10/03/16 0341 10/04/16 0623 10/05/16 1859 10/06/16 0554  WBC 13.0* 12.5* 10.6* 11.8* 12.1* 15.0*  CREATININE 1.38*  --   --  1.27* 1.45* 1.31*    Estimated Creatinine Clearance: 49.8 mL/min (by C-G formula based on SCr of 1.31 mg/dL (H)).    No Known Allergies  Douglas [name], Douglas [name] 10/08/2016 12:04 AM

## 2016-10-09 DIAGNOSIS — I6350 Cerebral infarction due to unspecified occlusion or stenosis of unspecified cerebral artery: Secondary | ICD-10-CM

## 2016-10-09 DIAGNOSIS — G8190 Hemiplegia, unspecified affecting unspecified side: Secondary | ICD-10-CM

## 2016-10-09 DIAGNOSIS — L539 Erythematous condition, unspecified: Secondary | ICD-10-CM

## 2016-10-09 DIAGNOSIS — I69991 Dysphagia following unspecified cerebrovascular disease: Secondary | ICD-10-CM

## 2016-10-09 DIAGNOSIS — E1149 Type 2 diabetes mellitus with other diabetic neurological complication: Secondary | ICD-10-CM

## 2016-10-09 LAB — GLUCOSE, CAPILLARY
GLUCOSE-CAPILLARY: 165 mg/dL — AB (ref 65–99)
GLUCOSE-CAPILLARY: 194 mg/dL — AB (ref 65–99)
Glucose-Capillary: 242 mg/dL — ABNORMAL HIGH (ref 65–99)
Glucose-Capillary: 298 mg/dL — ABNORMAL HIGH (ref 65–99)

## 2016-10-09 LAB — OCCULT BLOOD X 1 CARD TO LAB, STOOL: Fecal Occult Bld: POSITIVE — AB

## 2016-10-09 MED ORDER — INSULIN ASPART 100 UNIT/ML ~~LOC~~ SOLN
4.0000 [IU] | Freq: Three times a day (TID) | SUBCUTANEOUS | Status: DC
  Administered 2016-10-09 – 2016-10-10 (×4): 4 [IU] via SUBCUTANEOUS

## 2016-10-09 NOTE — Plan of Care (Signed)
Problem: RH BLADDER ELIMINATION Goal: RH STG MANAGE BLADDER WITH EQUIPMENT WITH ASSISTANCE STG Manage Bladder With Equipment With total Assistance   Outcome: Not Progressing Pulling on catheter.  Frequent redirection and hand mits at times  Problem: RH SKIN INTEGRITY Goal: RH STG MAINTAIN SKIN INTEGRITY WITH ASSISTANCE STG Maintain Skin Integrity With mod Assistance.   Outcome: Not Progressing Cleansed at intervals, rash on back  Problem: RH SAFETY Goal: RH STG ADHERE TO SAFETY PRECAUTIONS W/ASSISTANCE/DEVICE Pain less than or equal than 2  Outcome: Progressing No complaints of discomfort Goal: RH STG DECREASED RISK OF FALL WITH ASSISTANCE STG Decreased Risk of Fall With max Assistance.   Outcome: Progressing Requires redirection, safety belt when in chair.

## 2016-10-09 NOTE — Plan of Care (Signed)
Problem: RH SKIN INTEGRITY Goal: RH STG MAINTAIN SKIN INTEGRITY WITH ASSISTANCE STG Maintain Skin Integrity With mod Assistance.   Outcome: Progressing Total assist

## 2016-10-09 NOTE — Plan of Care (Signed)
Problem: RH BOWEL ELIMINATION Goal: RH STG MANAGE BOWEL WITH ASSISTANCE STG Manage Bowel with max Assistance.   Outcome: Not Progressing Incontinent requiring total assist.

## 2016-10-09 NOTE — Progress Notes (Addendum)
Subjective/Complaints: Pt laying in bed this AM.  He is sleepy.  Per nursing, pt has episodes of restlessness and lethargy.    Review of systems: Unable to obtain secondary to aphasia  Objective: Vital Signs: Blood pressure (!) 141/59, pulse (!) 103, temperature 98.3 F (36.8 C), temperature source Axillary, resp. rate 18, SpO2 94 %. No results found. Results for orders placed or performed during the hospital encounter of 10/05/16 (from the past 72 hour(s))  Glucose, capillary     Status: Abnormal   Collection Time: 10/06/16 12:08 PM  Result Value Ref Range   Glucose-Capillary 313 (H) 65 - 99 mg/dL   Comment 1 Notify RN   Glucose, capillary     Status: Abnormal   Collection Time: 10/06/16  5:11 PM  Result Value Ref Range   Glucose-Capillary 375 (H) 65 - 99 mg/dL   Comment 1 Notify RN   Glucose, capillary     Status: Abnormal   Collection Time: 10/06/16  8:49 PM  Result Value Ref Range   Glucose-Capillary 230 (H) 65 - 99 mg/dL   Comment 1 Notify RN   Glucose, capillary     Status: Abnormal   Collection Time: 10/07/16  6:33 AM  Result Value Ref Range   Glucose-Capillary 147 (H) 65 - 99 mg/dL   Comment 1 Notify RN   Glucose, capillary     Status: Abnormal   Collection Time: 10/07/16 12:05 PM  Result Value Ref Range   Glucose-Capillary 293 (H) 65 - 99 mg/dL   Comment 1 Notify RN   Glucose, capillary     Status: Abnormal   Collection Time: 10/07/16  5:14 PM  Result Value Ref Range   Glucose-Capillary 240 (H) 65 - 99 mg/dL  Occult blood card to lab, stool     Status: None   Collection Time: 10/07/16  5:57 PM  Result Value Ref Range   Fecal Occult Bld NEGATIVE NEGATIVE  Glucose, capillary     Status: Abnormal   Collection Time: 10/07/16  8:35 PM  Result Value Ref Range   Glucose-Capillary 302 (H) 65 - 99 mg/dL   Comment 1 Notify RN   Urinalysis, Routine w reflex microscopic     Status: Abnormal   Collection Time: 10/07/16 11:30 PM  Result Value Ref Range   Color,  Urine YELLOW YELLOW   APPearance HAZY (A) CLEAR   Specific Gravity, Urine 1.016 1.005 - 1.030   pH 5.0 5.0 - 8.0   Glucose, UA >=500 (A) NEGATIVE mg/dL   Hgb urine dipstick MODERATE (A) NEGATIVE   Bilirubin Urine NEGATIVE NEGATIVE   Ketones, ur NEGATIVE NEGATIVE mg/dL   Protein, ur 30 (A) NEGATIVE mg/dL   Nitrite NEGATIVE NEGATIVE   Leukocytes, UA LARGE (A) NEGATIVE   RBC / HPF TOO NUMEROUS TO COUNT 0 - 5 RBC/hpf   WBC, UA TOO NUMEROUS TO COUNT 0 - 5 WBC/hpf   Bacteria, UA RARE (A) NONE SEEN   Squamous Epithelial / LPF NONE SEEN NONE SEEN   WBC Clumps PRESENT   CBC with Differential/Platelet     Status: Abnormal   Collection Time: 10/08/16  5:42 AM  Result Value Ref Range   WBC 13.6 (H) 4.0 - 10.5 K/uL   RBC 3.03 (L) 4.22 - 5.81 MIL/uL   Hemoglobin 7.2 (L) 13.0 - 17.0 g/dL   HCT 40.9 (L) 81.1 - 91.4 %   MCV 78.5 78.0 - 100.0 fL   MCH 23.8 (L) 26.0 - 34.0 pg   MCHC 30.3  30.0 - 36.0 g/dL   RDW 16.1 (H) 09.6 - 04.5 %   Platelets 78 (L) 150 - 400 K/uL    Comment: SPECIMEN CHECKED FOR CLOTS REPEATED TO VERIFY PLATELET COUNT CONFIRMED BY SMEAR    Neutrophils Relative % 75 %   Lymphocytes Relative 12 %   Monocytes Relative 10 %   Eosinophils Relative 2 %   Basophils Relative 1 %   Neutro Abs 10.2 (H) 1.7 - 7.7 K/uL   Lymphs Abs 1.6 0.7 - 4.0 K/uL   Monocytes Absolute 1.4 (H) 0.1 - 1.0 K/uL   Eosinophils Absolute 0.3 0.0 - 0.7 K/uL   Basophils Absolute 0.1 0.0 - 0.1 K/uL  Glucose, capillary     Status: Abnormal   Collection Time: 10/08/16  6:37 AM  Result Value Ref Range   Glucose-Capillary 208 (H) 65 - 99 mg/dL  Glucose, capillary     Status: Abnormal   Collection Time: 10/08/16 11:41 AM  Result Value Ref Range   Glucose-Capillary 332 (H) 65 - 99 mg/dL  Glucose, capillary     Status: Abnormal   Collection Time: 10/08/16  4:53 PM  Result Value Ref Range   Glucose-Capillary 282 (H) 65 - 99 mg/dL  Glucose, capillary     Status: Abnormal   Collection Time: 10/08/16  8:46  PM  Result Value Ref Range   Glucose-Capillary 271 (H) 65 - 99 mg/dL  Glucose, capillary     Status: Abnormal   Collection Time: 10/09/16  6:30 AM  Result Value Ref Range   Glucose-Capillary 165 (H) 65 - 99 mg/dL      General: NAD. Vital signs reviewed.  Psych: Unable to assess due to aphasia Heart: RRR. No JVD. Lungs: +ronchi. Unlabored Abdomen: Positive bowel sounds, soft  Skin: Erythema on dorsum of left foot.  Neurologic: Somnolent  Inconsistently following commands, moving b/l UE. Nonverbal, global aphasia Unable to assess MMT and sensation due to aphasia Musculoskeletal: No edema. No tenderness.  Assessment/Plan: 1. 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 Physiatrist is providing close team supervision and 24 hour management of active medical problems listed below. Physiatrist and rehab team continue to assess barriers to discharge/monitor patient progress toward functional and medical goals. FIM: Function - Bathing Position: Bed Body parts bathed by patient: Right arm, Chest, Abdomen, Front perineal area, Right upper leg, Left upper leg Body parts bathed by helper: Buttocks, Right lower leg, Left lower leg, Back Bathing not applicable: Right lower leg, Left lower leg Assist Level:  (Moderate assist)  Function- Upper Body Dressing/Undressing What is the patient wearing?: Pull over shirt/dress Pull over shirt/dress - Perfomed by patient: Thread/unthread right sleeve, Thread/unthread left sleeve Pull over shirt/dress - Perfomed by helper: Put head through opening, Pull shirt over trunk Assist Level:  (moderate ) Function - Lower Body Dressing/Undressing What is the patient wearing?: Pants, Non-skid slipper socks Position: Bed Pants- Performed by patient: Pull pants up/down Pants- Performed by helper: Thread/unthread  right pants leg, Thread/unthread left pants leg, Fasten/unfasten pants Non-skid slipper socks- Performed by helper: Don/doff right sock, Don/doff left sock Assist for footwear: Dependant Assist for lower body dressing: 2 Helpers  Function - Toileting Toileting activity did not occur: No continent bowel/bladder event Toileting steps completed by helper: Adjust clothing prior to toileting, Performs perineal hygiene, Adjust clothing after toileting Assist level: Two helpers  Function - Archivist  transfer assistive device: Grab bar Assist level to toilet: 2 helpers Assist level from toilet: 2 helpers  Function - Chair/bed transfer Chair/bed transfer activity did not occur: Safety/medical concerns Chair/bed transfer method: Squat pivot Chair/bed transfer assist level: 2 helpers Chair/bed transfer assistive device: Armrests Chair/bed transfer details: Manual facilitation for weight shifting, Tactile cues for posture, Verbal cues for sequencing, Verbal cues for technique  Function - Locomotion: Wheelchair Wheelchair activity did not occur: Safety/medical concerns Wheel 50 feet with 2 turns activity did not occur: Safety/medical concerns Wheel 150 feet activity did not occur: Safety/medical concerns Function - Locomotion: Ambulation Ambulation activity did not occur: Safety/medical concerns Walk 10 feet activity did not occur: Safety/medical concerns Walk 50 feet with 2 turns activity did not occur: Safety/medical concerns Walk 150 feet activity did not occur: Safety/medical concerns Walk 10 feet on uneven surfaces activity did not occur: Safety/medical concerns  Function - Comprehension Comprehension: Auditory Comprehension assist level: Understands basic 25 - 49% of the time/ requires cueing 50 - 75% of the time  Function - Expression Expression: Verbal Expression assist level: Expresses basis less than 25% of the time/requires cueing >75% of the time.  Function -  Social Interaction Social Interaction assist level: Interacts appropriately 25 - 49% of time - Needs frequent redirection.  Function - Problem Solving Problem solving assist level: Solves basic 25 - 49% of the time - needs direction more than half the time to initiate, plan or complete simple activities  Function - Memory Memory assist level: Recognizes or recalls less than 25% of the time/requires cueing greater than 75% of the time Patient normally able to recall (first 3 days only): That he or she is in a hospital   Medical Problem List and Plan: 1.  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  DVT Prophylaxis/Anticoagulation: Subcutaneous Lovenox, holding.  Monitor platelet counts and any signs of bleeding  SCDs ordered 3. Pain Management: Tylenol as needed 4. Dysphagia. Dysphagia #1 nectar liquids. Monitor for any signs of aspiration.   Added daily at bedtime IV fluids 5. Neuropsych: This patient is capable of making decisions on his own behalf. 6. Skin/Wound Care: Routine skin checks 7. Fluids/Electrolytes/Nutrition: Routine I&Os 8. Aspiration pneumonia. Augmentin 8 doses initiated 10/02/2016. 9. Diabetes mellitus with peripheral neuropathy. Hemoglobin A1c 7.2.   Check blood sugars before meals and at bedtime  Lantus insulin 12 units daily.   Novolog 4U with meals added 12/25  Continues to be labile, with elevations >330 10. Hypertension/tachycardia. Lopressor 12.5 mg twice a day. Monitor heart rate with increased mobility  Overall controlled 12/25 11. Stage III chronic kidney disease. Baseline creatinine 1.62. Follow-up chemistries  BMP Latest Ref Rng & Units 10/06/2016 10/05/2016 10/04/2016  Glucose 65 - 99 mg/dL 161(W197(H) - 960(A181(H)  BUN 6 - 20 mg/dL 54(U26(H) - 98(J27(H)  Creatinine 0.61 - 1.24 mg/dL 1.91(Y1.31(H) 7.82(N1.45(H) 5.62(Z1.27(H)  Sodium 135 - 145 mmol/L 143 - 143  Potassium 3.5 - 5.1 mmol/L 3.7 - 3.7  Chloride 101 - 111 mmol/L 108 - 109  CO2 22  - 32 mmol/L 25 - 21(L)  Calcium 8.9 - 10.3 mg/dL 9.4 - 9.5   12. Mood. Depakote 250 mg BID added for bouts of agitation and restlessness. 13. Hypothyroidism. Synthroid 14. Hyperlipidemia. Lipitor 15. Anemia, acute blood loss.   Hb 7.2 on 12/24  Hemocult neg  Cont to monitor 16.  Leukocytosis:  Febrile 12/23  CXR reviewed 12/22 with stable infiltrates, cont to monitor  WBCs 13.6  on 12/24  Foley replaced  UA+, Ucx remains pending, likely urinary source 17. Thrombocytopenia:   Plts 78 on 12/24, Lovenox held  SRA pending  Will cont to monitor 18. Hypoalbuminemia  Supplement started 12/23 19. Acute lower UTI  Foley to stay in place, difficult placement with tight foreskin.  Will consider Urology consult  Cipro started 12/24 for UTI +/- PNA 20. Left foot erythema  Possible related to trauma  Pt is already on abx.  LOS (Days) 4 A FACE TO FACE EVALUATION WAS PERFORMED  Gionni Vaca Karis Jubanil Giabella Duhart 10/09/2016, 8:45 AM

## 2016-10-10 ENCOUNTER — Inpatient Hospital Stay (HOSPITAL_COMMUNITY): Payer: Medicare Other | Admitting: Physical Therapy

## 2016-10-10 ENCOUNTER — Inpatient Hospital Stay (HOSPITAL_COMMUNITY): Payer: Medicare Other | Admitting: Speech Pathology

## 2016-10-10 ENCOUNTER — Inpatient Hospital Stay (HOSPITAL_COMMUNITY): Payer: Medicare Other | Admitting: Occupational Therapy

## 2016-10-10 LAB — BASIC METABOLIC PANEL
ANION GAP: 7 (ref 5–15)
BUN: 22 mg/dL — ABNORMAL HIGH (ref 6–20)
CALCIUM: 8.3 mg/dL — AB (ref 8.9–10.3)
CHLORIDE: 105 mmol/L (ref 101–111)
CO2: 24 mmol/L (ref 22–32)
Creatinine, Ser: 1.16 mg/dL (ref 0.61–1.24)
GFR calc non Af Amer: 60 mL/min — ABNORMAL LOW (ref >60)
GLUCOSE: 202 mg/dL — AB (ref 65–99)
POTASSIUM: 3.8 mmol/L (ref 3.5–5.1)
Sodium: 136 mmol/L (ref 135–145)

## 2016-10-10 LAB — URINE CULTURE

## 2016-10-10 LAB — GLUCOSE, CAPILLARY
GLUCOSE-CAPILLARY: 333 mg/dL — AB (ref 65–99)
GLUCOSE-CAPILLARY: 294 mg/dL — AB (ref 65–99)
Glucose-Capillary: 181 mg/dL — ABNORMAL HIGH (ref 65–99)
Glucose-Capillary: 343 mg/dL — ABNORMAL HIGH (ref 65–99)

## 2016-10-10 LAB — PROTIME-INR
INR: 1.23
Prothrombin Time: 15.6 seconds — ABNORMAL HIGH (ref 11.4–15.2)

## 2016-10-10 LAB — OCCULT BLOOD X 1 CARD TO LAB, STOOL: Fecal Occult Bld: POSITIVE — AB

## 2016-10-10 MED ORDER — SODIUM CHLORIDE 0.9 % IV SOLN: INTRAVENOUS | Status: DC

## 2016-10-10 MED ORDER — INSULIN GLARGINE 100 UNIT/ML ~~LOC~~ SOLN
16.0000 [IU] | Freq: Every day | SUBCUTANEOUS | Status: DC
  Administered 2016-10-11 – 2016-10-14 (×4): 16 [IU] via SUBCUTANEOUS
  Filled 2016-10-10 (×4): qty 0.16

## 2016-10-10 MED ORDER — INSULIN ASPART 100 UNIT/ML ~~LOC~~ SOLN
8.0000 [IU] | Freq: Three times a day (TID) | SUBCUTANEOUS | Status: DC
  Administered 2016-10-10 – 2016-10-11 (×3): 8 [IU] via SUBCUTANEOUS

## 2016-10-10 NOTE — Progress Notes (Signed)
Physical Therapy Session Note  Patient Details  Name: Douglas [name] MRN: [id number] Date of Birth: 1940-10-17  Today's Date: 10/10/2016 PT Individual Time: 1000-1055 PT Individual Time Calculation (min): 55 min    Short Term Goals: Week 1:  PT Short Term Goal 1 (Week 1): Pt will transition to sitting EOB with +1 assist PT Short Term Goal 2 (Week 1): Pt will transfer to w/c with +1 assist and LRAD PT Short Term Goal 3 (Week 1): Pt will initiate gait training with PT  Skilled Therapeutic Interventions/Progress Updates:    Pt resting in w/c in nursing station, no c/o pain, agreeable to therapy session.    Pt engaged in activity in standing for increased standing tolerance and postural control, focus on attention, visual scanning, posture, weight shift L, and equal weight bearing in BLEs.  Pt requires max assist for sit<>stand without standing frame features and able to tolerate 2 trials of 5 minutes in standing frame with harness positioned loosely around pt for safety.  Pt noted to be incontinent of bowel so returned to room.  +2 for squat/pivot back to bed and pt able to transition to supine with mod assist to initiate trunk descent and assist for RLE into bed.  Rolling L and R with min tactile cues for initiation for clothing management and hygiene.  Bridging for clothing adjustment with manual placement of BLEs into hook lying position and mod cues to lift hips.  Scooting to [employer] with +1 assist using draw sheet and trendellenberg position with pt doing appx 50% of scoot.  Pt positioned in chair position and attempted table top activity for visual scanning, attention, and functional use of RUE.  Pt required hand over hand assist to remove pegs from a peg board with constant cues to maintain arousal.  Pt positioned semi reclined at end of session with call bell in reach and needs met.   Therapy Documentation Precautions:  Precautions Precautions: Fall Precaution Comments: R side inattention,  strong push to R side, posterior lean Restrictions Weight Bearing Restrictions: (P) No   See Function Navigator for Current Functional Status.   Therapy/Group: Individual Therapy  Chrysta Fulcher E Penven-Crew 10/10/2016, 11:02 AM

## 2016-10-10 NOTE — Progress Notes (Signed)
Subjective/Complaints: Pt seen laying in bed this AM.  He is sleepy.  Per nursing, he slept well overnight.  Informed by nursing regarding rash on back yesterday, evaluated with nursing today, no appreciable rash noted.  Review of systems: Unable to obtain secondary to aphasia  Objective: Vital Signs: Blood pressure 135/61, pulse (!) 117, temperature 98.9 F (37.2 C), temperature source Oral, resp. rate 18, SpO2 93 %. No results found. Results for orders placed or performed during the hospital encounter of 10/05/16 (from the past 72 hour(s))  Glucose, capillary     Status: Abnormal   Collection Time: 10/07/16 12:05 PM  Result Value Ref Range   Glucose-Capillary 293 (H) 65 - 99 mg/dL   Comment 1 Notify RN   Glucose, capillary     Status: Abnormal   Collection Time: 10/07/16  5:14 PM  Result Value Ref Range   Glucose-Capillary 240 (H) 65 - 99 mg/dL  Occult blood card to lab, stool     Status: None   Collection Time: 10/07/16  5:57 PM  Result Value Ref Range   Fecal Occult Bld NEGATIVE NEGATIVE  Glucose, capillary     Status: Abnormal   Collection Time: 10/07/16  8:35 PM  Result Value Ref Range   Glucose-Capillary 302 (H) 65 - 99 mg/dL   Comment 1 Notify RN   Urine culture     Status: Abnormal   Collection Time: 10/07/16 11:30 PM  Result Value Ref Range   Specimen Description URINE, CLEAN CATCH    Special Requests NONE    Culture 80,000 COLONIES/mL CITROBACTER FREUNDII (A)    Report Status 10/10/2016 FINAL    Organism ID, Bacteria CITROBACTER FREUNDII (A)       Susceptibility   Citrobacter freundii - MIC*    CEFAZOLIN >=64 RESISTANT Resistant     CEFTRIAXONE <=1 SENSITIVE Sensitive     CIPROFLOXACIN <=0.25 SENSITIVE Sensitive     GENTAMICIN <=1 SENSITIVE Sensitive     IMIPENEM 1 SENSITIVE Sensitive     NITROFURANTOIN <=16 SENSITIVE Sensitive     TRIMETH/SULFA <=20 SENSITIVE Sensitive     PIP/TAZO <=4 SENSITIVE Sensitive     * 80,000 COLONIES/mL CITROBACTER FREUNDII   Urinalysis, Routine w reflex microscopic     Status: Abnormal   Collection Time: 10/07/16 11:30 PM  Result Value Ref Range   Color, Urine YELLOW YELLOW   APPearance HAZY (A) CLEAR   Specific Gravity, Urine 1.016 1.005 - 1.030   pH 5.0 5.0 - 8.0   Glucose, UA >=500 (A) NEGATIVE mg/dL   Hgb urine dipstick MODERATE (A) NEGATIVE   Bilirubin Urine NEGATIVE NEGATIVE   Ketones, ur NEGATIVE NEGATIVE mg/dL   Protein, ur 30 (A) NEGATIVE mg/dL   Nitrite NEGATIVE NEGATIVE   Leukocytes, UA LARGE (A) NEGATIVE   RBC / HPF TOO NUMEROUS TO COUNT 0 - 5 RBC/hpf   WBC, UA TOO NUMEROUS TO COUNT 0 - 5 WBC/hpf   Bacteria, UA RARE (A) NONE SEEN   Squamous Epithelial / LPF NONE SEEN NONE SEEN   WBC Clumps PRESENT   CBC with Differential/Platelet     Status: Abnormal   Collection Time: 10/08/16  5:42 AM  Result Value Ref Range   WBC 13.6 (H) 4.0 - 10.5 K/uL   RBC 3.03 (L) 4.22 - 5.81 MIL/uL   Hemoglobin 7.2 (L) 13.0 - 17.0 g/dL   HCT 60.1 (L) 09.3 - 23.5 %   MCV 78.5 78.0 - 100.0 fL   MCH 23.8 (L) 26.0 - 34.0  pg   MCHC 30.3 30.0 - 36.0 g/dL   RDW 16.120.8 (H) 09.611.5 - 04.515.5 %   Platelets 78 (L) 150 - 400 K/uL    Comment: SPECIMEN CHECKED FOR CLOTS REPEATED TO VERIFY PLATELET COUNT CONFIRMED BY SMEAR    Neutrophils Relative % 75 %   Lymphocytes Relative 12 %   Monocytes Relative 10 %   Eosinophils Relative 2 %   Basophils Relative 1 %   Neutro Abs 10.2 (H) 1.7 - 7.7 K/uL   Lymphs Abs 1.6 0.7 - 4.0 K/uL   Monocytes Absolute 1.4 (H) 0.1 - 1.0 K/uL   Eosinophils Absolute 0.3 0.0 - 0.7 K/uL   Basophils Absolute 0.1 0.0 - 0.1 K/uL  Glucose, capillary     Status: Abnormal   Collection Time: 10/08/16  6:37 AM  Result Value Ref Range   Glucose-Capillary 208 (H) 65 - 99 mg/dL  Glucose, capillary     Status: Abnormal   Collection Time: 10/08/16 11:41 AM  Result Value Ref Range   Glucose-Capillary 332 (H) 65 - 99 mg/dL  Glucose, capillary     Status: Abnormal   Collection Time: 10/08/16  4:53 PM  Result  Value Ref Range   Glucose-Capillary 282 (H) 65 - 99 mg/dL  Glucose, capillary     Status: Abnormal   Collection Time: 10/08/16  8:46 PM  Result Value Ref Range   Glucose-Capillary 271 (H) 65 - 99 mg/dL  Glucose, capillary     Status: Abnormal   Collection Time: 10/09/16  6:30 AM  Result Value Ref Range   Glucose-Capillary 165 (H) 65 - 99 mg/dL  Glucose, capillary     Status: Abnormal   Collection Time: 10/09/16 11:31 AM  Result Value Ref Range   Glucose-Capillary 242 (H) 65 - 99 mg/dL  Occult blood card to lab, stool RN will collect     Status: Abnormal   Collection Time: 10/09/16 12:55 PM  Result Value Ref Range   Fecal Occult Bld POSITIVE (A) NEGATIVE  Glucose, capillary     Status: Abnormal   Collection Time: 10/09/16  4:42 PM  Result Value Ref Range   Glucose-Capillary 298 (H) 65 - 99 mg/dL  Glucose, capillary     Status: Abnormal   Collection Time: 10/09/16  8:52 PM  Result Value Ref Range   Glucose-Capillary 194 (H) 65 - 99 mg/dL  Occult blood card to lab, stool RN will collect     Status: Abnormal   Collection Time: 10/10/16 12:23 AM  Result Value Ref Range   Fecal Occult Bld POSITIVE (A) NEGATIVE  Glucose, capillary     Status: Abnormal   Collection Time: 10/10/16  6:35 AM  Result Value Ref Range   Glucose-Capillary 181 (H) 65 - 99 mg/dL      General: NAD. Vital signs reviewed.  Psych: Unable to assess due to aphasia Heart: RRR. No JVD. Lungs: Unlabored. Clear. Abdomen: Positive bowel sounds, soft  Skin: Erythema on dorsum of left foot. No rash on back noted.  Neurologic: Somnolent (unchanged) Inconsistently following commands, moving b/l UE. Nonverbal, global aphasia Unable to assess MMT and sensation due to aphasia Musculoskeletal: No edema. No tenderness.  Assessment/Plan: 1. 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 Physiatrist is providing close team supervision and 24 hour management of active medical problems listed below. Physiatrist and rehab team continue to assess barriers to  discharge/monitor patient progress toward functional and medical goals. FIM: Function - Bathing Position: Bed Body parts bathed by patient: Right arm, Chest, Abdomen, Front perineal area, Right upper leg, Left upper leg Body parts bathed by helper: Buttocks, Right lower leg, Left lower leg, Back Bathing not applicable: Right lower leg, Left lower leg Assist Level:  (Moderate assist)  Function- Upper Body Dressing/Undressing What is the patient wearing?: Pull over shirt/dress Pull over shirt/dress - Perfomed by patient: Thread/unthread right sleeve, Thread/unthread left sleeve Pull over shirt/dress - Perfomed by helper: Put head through opening, Pull shirt over trunk Assist Level:  (moderate ) Function - Lower Body Dressing/Undressing What is the patient wearing?: Pants, Non-skid slipper socks Position: Bed Pants- Performed by patient: Pull pants up/down Pants- Performed by helper: Thread/unthread right pants leg, Thread/unthread left pants leg, Fasten/unfasten pants Non-skid slipper socks- Performed by helper: Don/doff right sock, Don/doff left sock Assist for footwear: Dependant Assist for lower body dressing: 2 Helpers  Function - Toileting Toileting activity did not occur: No continent bowel/bladder event Toileting steps completed by helper: Adjust clothing prior to toileting, Performs perineal hygiene, Adjust clothing after toileting Assist level: Two helpers  Function - ArchivistToilet Transfers Toilet transfer assistive device: Grab bar Assist level to toilet: 2 helpers Assist level from toilet: 2 helpers  Function - Chair/bed transfer Chair/bed transfer activity did not occur: Safety/medical concerns Chair/bed transfer method: Squat pivot Chair/bed transfer assist level: 2 helpers Chair/bed transfer  assistive device: Armrests Chair/bed transfer details: Manual facilitation for weight shifting, Tactile cues for posture, Verbal cues for sequencing, Verbal cues for technique  Function - Locomotion: Education administratorWheelchair Wheelchair activity did not occur: Safety/medical concerns Wheel 50 feet with 2 turns activity did not occur: Safety/medical concerns Wheel 150 feet activity did not occur: Safety/medical concerns Function - Locomotion: Ambulation Ambulation activity did not occur: Safety/medical concerns Walk 10 feet activity did not occur: Safety/medical concerns Walk 50 feet with 2 turns activity did not occur: Safety/medical concerns Walk 150 feet activity did not occur: Safety/medical concerns Walk 10 feet on uneven surfaces activity did not occur: Safety/medical concerns  Function - Comprehension Comprehension: Auditory Comprehension assist level: Understands basic 25 - 49% of the time/ requires cueing 50 - 75% of the time  Function - Expression Expression: Verbal Expression assist level: Expresses basis less than 25% of the time/requires cueing >75% of the time.  Function - Social Interaction Social Interaction assist level: Interacts appropriately less than 25% of the time. May be withdrawn or combative.  Function - Problem Solving Problem solving assist level: Solves basic less than 25% of the time - needs direction nearly all the time or does not effectively solve problems and may need a restraint for safety  Function - Memory Memory assist level: More than reasonable amount of time, Recognizes or recalls less than 25% of the time/requires cueing greater than 75% of the time Patient normally able to recall (first 3 days only): That he or she is in a hospital   Medical Problem List and Plan: 1.  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  DVT Prophylaxis/Anticoagulation: Subcutaneous Lovenox, holding.  Monitor platelet counts and any signs  of bleeding  SCDs ordered 3. Pain Management: Tylenol as needed 4. Dysphagia. Dysphagia #1 nectar liquids. Monitor for any signs of aspiration.   Added daily at bedtime IV fluids 5. Neuropsych: This patient is capable of making decisions on his own behalf. 6. Skin/Wound Care: Routine skin checks 7. Fluids/Electrolytes/Nutrition: Routine I&Os  8. Aspiration pneumonia. Augmentin 8 doses initiated 10/02/2016. 9. Diabetes mellitus with peripheral neuropathy. Hemoglobin A1c 7.2.   Check blood sugars before meals and at bedtime  Lantus insulin 12 units daily, increased to 16U on 12/26.  Novolog 4U with meals added 12/25, increased to 8U on 12/26  Continues to be labile, with elevations ~300 10. Hypertension/tachycardia. Lopressor 12.5 mg twice a day. Monitor heart rate with increased mobility  Overall controlled 12/26 11. Stage III chronic kidney disease. Baseline creatinine 1.62. Follow-up chemistries  BMP Latest Ref Rng & Units 10/06/2016 10/05/2016 10/04/2016  Glucose 65 - 99 mg/dL 161(W) - 960(A)  BUN 6 - 20 mg/dL 54(U) - 98(J)  Creatinine 0.61 - 1.24 mg/dL 1.91(Y) 7.82(N) 5.62(Z)  Sodium 135 - 145 mmol/L 143 - 143  Potassium 3.5 - 5.1 mmol/L 3.7 - 3.7  Chloride 101 - 111 mmol/L 108 - 109  CO2 22 - 32 mmol/L 25 - 21(L)  Calcium 8.9 - 10.3 mg/dL 9.4 - 9.5   12. Mood. Depakote 250 mg BID added for bouts of agitation and restlessness. 13. Hypothyroidism. Synthroid 14. Hyperlipidemia. Lipitor 15. Anemia, acute blood loss.   Hb 7.2 on 12/24  Hemoccult + 12/25  Labs ordered  Cont to monitor 16.  Leukocytosis: likely from from UTI  Febrile 12/23, afebrile since  CXR reviewed 12/22 with stable infiltrates, cont to monitor  WBCs 13.6 on 12/24  Foley replaced  UA+, Ucx Citrobacter   Labs ordered for tomorrow 17. Thrombocytopenia:   Plts 78 on 12/24, Lovenox held  SRA remains pending  Will cont to monitor  Labs ordered for tomorrow 18. Hypoalbuminemia  Supplement started  12/23 19. Acute lower UTI  Foley to stay in place, difficult placement with tight foreskin.  Will consider Urology consult  Ucx Citrobacter  Cipro started 12/24 for UTI +/- PNA 20. Left foot erythema  Possible related to trauma  Pt is already on abx.  LOS (Days) 5 A FACE TO FACE EVALUATION WAS PERFORMED  Ankit Karis Juba 10/10/2016, 8:45 AM

## 2016-10-10 NOTE — Progress Notes (Signed)
Physical Therapy Session Note  Patient Details  Name: Douglas [name] MRN: [id number] Date of Birth: Dec 16, 1940  Today's Date: 10/10/2016 PT Individual Time: 6644-0347 PT Individual Time Calculation (min): 40 min    Short Term Goals: Week 1:  PT Short Term Goal 1 (Week 1): Pt will transition to sitting EOB with +1 assist PT Short Term Goal 2 (Week 1): Pt will transfer to w/c with +1 assist and LRAD PT Short Term Goal 3 (Week 1): Pt will initiate gait training with PT  Skilled Therapeutic Interventions/Progress Updates:    Pt resting in nursing station on arrival, no c/o pain, and agreeable to therapy session.  Session focus on sit<>stands, attention, and midline orientation.  On 2 occasions during session pt became fixated on foley tubing and required total cues to redirect to task.  Attempted sit<>stand x2 from w/c with RW and total assist with pt demonstrating flexed posture and strong push to the R side with LEs flexed and ?in plantarflexion.  Attempted sit<>stand on side of // bars, but pt again with strong push to the R and unable to correct with +1 assist.  Pt also demos same flexed posture with LEs flexed.  Pt returned to w/c and PT positioned back in chair total assist.  Dynamic sitting balance focus on reaching to L for increased weight shift with max cues to keep head lifted, but pt unable to keep head lifting while simultaneously reaching for cup.  Pt returned to nursing station at end of session, asking for something to drink and PT provided full supervision as pt took several sips of thickened orange juice.  Pt able to hold juice in R hand and steady R hand with L hand, min tactile cues from therapist for upright posture during intake.  Pt left at nursing station in w/c with QRB in place, call bell in reach and needs met.   Therapy Documentation Precautions:  Precautions Precautions: Fall Precaution Comments: R side inattention, strong push to R side, posterior  lean Restrictions Weight Bearing Restrictions: No   See Function Navigator for Current Functional Status.   Therapy/Group: Individual Therapy  Douglas [name] 10/10/2016, 4:27 PM

## 2016-10-10 NOTE — Progress Notes (Signed)
Occupational Therapy Session Note  Patient Details  Name: Douglas [name] MRN: [id number] Date of Birth: [birth year]  Today's Date: 10/10/2016 OT Individual Time: 1300-1330 OT Individual Time Calculation (min): 30 min     Short Term Goals: Week 1:  OT Short Term Goal 1 (Week 1): Pt will maintain unsupported sitting balance with min assist for 2 mins to engage in self-care task OT Short Term Goal 2 (Week 1): Pt will transfer to toilet with max assist of 1 caregiver OT Short Term Goal 3 (Week 1): Pt will complete sit > stand with max assist of one caregiver to progress towards LB dressing at sit > stand level OT Short Term Goal 4 (Week 1): Pt will complete LB dressing with max assist  OT Short Term Goal 5 (Week 1): Pt will complete 2 grooming tasks with < mod verbal cues due to ideational apraxia  Skilled Therapeutic Interventions/Progress Updates:    OT session focused on functional transfers, R NMR, bilateral UE coordination, and trunk control. Stand-pivot transfer w/ Mod A +2. Bilateral UE coordination, motor planning and  hand-eye coordination using beach ball. Hand-over hand assist required  w/ R UE w/ max cues for motor planning. Dynamic reaching, weight shifting and trunk extension using graded cup stacking task. Max multimodal cues to attend to L to stack cups. Pt left seated at nurses station with quick release belt on and needs met.   Therapy Documentation Precautions:  Precautions Precautions: Fall Precaution Comments: R side inattention, strong push to R side, posterior lean Restrictions Weight Bearing Restrictions: No Pain: Pain Assessment Pain Assessment: No/denies pain    See Function Navigator for Current Functional Status.   Therapy/Group: Individual Therapy  Valma Cava 10/10/2016, 4:03 PM

## 2016-10-10 NOTE — Progress Notes (Signed)
Speech Language Pathology Daily Session Note  Patient Details  Name: Douglas [name] MRN: [id number] Date of Birth: 04/04/1941  Today's Date: 10/10/2016 SLP Individual Time: [id number] SLP Individual Time Calculation (min): 55 min   Short Term Goals: Week 1: SLP Short Term Goal 1 (Week 1): Pt will answer yes/no questions for 75% accuracy with max assist multimodal cues.   SLP Short Term Goal 2 (Week 1): Pt will follow 1 step commands for 75% accuracy with max assist multimodal cues.   SLP Short Term Goal 3 (Week 1): Pt will consume dys 1 textures and nectar thick liquids with mod assist for use of swallowing precautions and minimal overt s/s of aspiration.   SLP Short Term Goal 4 (Week 1): Pt will verbalize at the word level during structured tasks for 50% accuracy with max assist multimodal cues. SLP Short Term Goal 5 (Week 1): Pt will utilize multimodal means of communication to convey needs and wants to caregivers with max assist cues.    Skilled Therapeutic Interventions:  Pt was seen for skilled ST targeting goals for communication and dysphagia.  Pt noted with orders in chart for NPO due to TEE, verified with RN who reports that orders are inaccurate and pt had already eaten breakfast.  Therefore, SLP proceeded with trials to continue working towards diet progression.  Pt needed total assist for initiation and sequencing of self feeding.  He demonstrated consistent anterior labial loss of materials due to decreased labial seal and needed max to total assist to wipe mouth clear.  No overt s/s of aspiration were evident with nectar thick liquids or purees.  Pt needed total assist to identify object when named from a field of 2.  No real word utterances appreciated on this date with the exception of 1-2 spontaneous automatic utterances which were largely unintelligible.  Suspect lethargy continues to be impacting function.  Pt left in wheelchair at nursing station with quick release belt and  bilateral mitts donned.  Continue per current plan of care.       Function:  Eating Eating   Modified Consistency Diet: Yes Eating Assist Level: Helper checks for pocketed food;Helper scoops food on utensil;Helper brings food to mouth;Help with picking up utensils;Help managing cup/glass;Hand over hand assist           Cognition Comprehension Comprehension assist level: Understands basic 25 - 49% of the time/ requires cueing 50 - 75% of the time  Expression   Expression assist level: Expresses basis less than 25% of the time/requires cueing >75% of the time.  Social Interaction Social Interaction assist level: Interacts appropriately less than 25% of the time. May be withdrawn or combative.  Problem Solving Problem solving assist level: Solves basic less than 25% of the time - needs direction nearly all the time or does not effectively solve problems and may need a restraint for safety  Memory Memory assist level: Recognizes or recalls less than 25% of the time/requires cueing greater than 75% of the time    Pain Pain Assessment Pain Assessment: No/denies pain  Therapy/Group: Individual Therapy  Dianelly Ferran, Melanee SpryNicole L 10/10/2016, 12:27 PM

## 2016-10-11 ENCOUNTER — Inpatient Hospital Stay (HOSPITAL_COMMUNITY): Payer: Medicare Other

## 2016-10-11 ENCOUNTER — Inpatient Hospital Stay (HOSPITAL_COMMUNITY): Payer: Medicare Other | Admitting: Speech Pathology

## 2016-10-11 ENCOUNTER — Inpatient Hospital Stay (HOSPITAL_COMMUNITY): Payer: Medicare Other | Admitting: Physical Therapy

## 2016-10-11 ENCOUNTER — Inpatient Hospital Stay (HOSPITAL_COMMUNITY): Payer: Medicare Other | Admitting: Occupational Therapy

## 2016-10-11 LAB — CBC WITH DIFFERENTIAL/PLATELET
Basophils Absolute: 0.2 10*3/uL — ABNORMAL HIGH (ref 0.0–0.1)
Basophils Relative: 1 %
EOS ABS: 0.6 10*3/uL (ref 0.0–0.7)
EOS PCT: 3 %
HEMATOCRIT: 21.2 % — AB (ref 39.0–52.0)
Hemoglobin: 6.2 g/dL — CL (ref 13.0–17.0)
LYMPHS ABS: 2.4 10*3/uL (ref 0.7–4.0)
Lymphocytes Relative: 13 %
MCH: 24.2 pg — ABNORMAL LOW (ref 26.0–34.0)
MCHC: 29.2 g/dL — AB (ref 30.0–36.0)
MCV: 82.8 fL (ref 78.0–100.0)
MONO ABS: 1.3 10*3/uL — AB (ref 0.1–1.0)
Monocytes Relative: 7 %
Neutro Abs: 14 10*3/uL — ABNORMAL HIGH (ref 1.7–7.7)
Neutrophils Relative %: 76 %
Platelets: 49 10*3/uL — ABNORMAL LOW (ref 150–400)
RBC: 2.56 MIL/uL — ABNORMAL LOW (ref 4.22–5.81)
RDW: 22.4 % — AB (ref 11.5–15.5)
WBC: 18.5 10*3/uL — AB (ref 4.0–10.5)

## 2016-10-11 LAB — GLUCOSE, CAPILLARY
GLUCOSE-CAPILLARY: 184 mg/dL — AB (ref 65–99)
Glucose-Capillary: 193 mg/dL — ABNORMAL HIGH (ref 65–99)
Glucose-Capillary: 187 mg/dL — ABNORMAL HIGH (ref 65–99)
Glucose-Capillary: 141 mg/dL — ABNORMAL HIGH (ref 65–99)

## 2016-10-11 LAB — IRON AND TIBC
Iron: 23 ug/dL — ABNORMAL LOW (ref 45–182)
Saturation Ratios: 6 % — ABNORMAL LOW (ref 17.9–39.5)
TIBC: 396 ug/dL (ref 250–450)
UIBC: 373 ug/dL

## 2016-10-11 LAB — CBC
HCT: 21.8 % — ABNORMAL LOW (ref 39.0–52.0)
HCT: 20.4 % — ABNORMAL LOW (ref 39.0–52.0)
HCT: 19.7 % — ABNORMAL LOW (ref 39.0–52.0)
Hemoglobin: 6.3 g/dL — CL (ref 13.0–17.0)
Hemoglobin: 6.1 g/dL — CL (ref 13.0–17.0)
Hemoglobin: 5.9 g/dL — CL (ref 13.0–17.0)
MCH: 24 pg — AB (ref 26.0–34.0)
MCH: 24.2 pg — ABNORMAL LOW (ref 26.0–34.0)
MCH: 24.2 pg — ABNORMAL LOW (ref 26.0–34.0)
MCHC: 28.9 g/dL — ABNORMAL LOW (ref 30.0–36.0)
MCHC: 29.9 g/dL — AB (ref 30.0–36.0)
MCHC: 29.9 g/dL — ABNORMAL LOW (ref 30.0–36.0)
MCV: 82.9 fL (ref 78.0–100.0)
MCV: 81 fL (ref 78.0–100.0)
MCV: 80.7 fL (ref 78.0–100.0)
PLATELETS: 42 10*3/uL — AB (ref 150–400)
PLATELETS: 38 10*3/uL — AB (ref 150–400)
Platelets: 44 10*3/uL — ABNORMAL LOW (ref 150–400)
RBC: 2.63 MIL/uL — ABNORMAL LOW (ref 4.22–5.81)
RBC: 2.52 MIL/uL — ABNORMAL LOW (ref 4.22–5.81)
RBC: 2.44 MIL/uL — AB (ref 4.22–5.81)
RDW: 22.4 % — AB (ref 11.5–15.5)
RDW: 22.3 % — AB (ref 11.5–15.5)
RDW: 22.4 % — ABNORMAL HIGH (ref 11.5–15.5)
WBC: 21.6 10*3/uL — AB (ref 4.0–10.5)
WBC: 16.2 10*3/uL — ABNORMAL HIGH (ref 4.0–10.5)
WBC: 16.6 10*3/uL — AB (ref 4.0–10.5)

## 2016-10-11 LAB — BASIC METABOLIC PANEL
Anion gap: 7 (ref 5–15)
BUN: 23 mg/dL — AB (ref 6–20)
CALCIUM: 8.3 mg/dL — AB (ref 8.9–10.3)
CHLORIDE: 106 mmol/L (ref 101–111)
CO2: 24 mmol/L (ref 22–32)
CREATININE: 1.13 mg/dL (ref 0.61–1.24)
GFR calc Af Amer: >60 mL/min (ref >60)
Glucose, Bld: 192 mg/dL — ABNORMAL HIGH (ref 65–99)
Potassium: 4 mmol/L (ref 3.5–5.1)
SODIUM: 137 mmol/L (ref 135–145)

## 2016-10-11 LAB — DIC (DISSEMINATED INTRAVASCULAR COAGULATION)PANEL
Fibrinogen: 248 mg/dL (ref 210–475)
INR: 1.3
Platelets: 44 10*3/uL — ABNORMAL LOW (ref 150–400)
aPTT: 33 seconds (ref 24–36)

## 2016-10-11 LAB — ABO/RH: ABO/RH(D): O POS

## 2016-10-11 LAB — RETICULOCYTES
RBC.: 2.69 MIL/uL — ABNORMAL LOW (ref 4.22–5.81)
RETIC COUNT ABSOLUTE: 255.6 10*3/uL — AB (ref 19.0–186.0)
Retic Ct Pct: 9.5 % — ABNORMAL HIGH (ref 0.4–3.1)

## 2016-10-11 LAB — SAVE SMEAR

## 2016-10-11 LAB — FERRITIN: FERRITIN: 36 ng/mL (ref 24–336)

## 2016-10-11 LAB — LACTATE DEHYDROGENASE: LDH: 297 U/L — AB (ref 98–192)

## 2016-10-11 LAB — DIC (DISSEMINATED INTRAVASCULAR COAGULATION) PANEL: PROTHROMBIN TIME: 16.3 s — AB (ref 11.4–15.2)

## 2016-10-11 LAB — PREPARE RBC (CROSSMATCH)

## 2016-10-11 MED ORDER — VANCOMYCIN HCL IN DEXTROSE 750-5 MG/150ML-% IV SOLN
750.0000 mg | Freq: Two times a day (BID) | INTRAVENOUS | Status: DC
  Administered 2016-10-11 – 2016-10-16 (×10): 750 mg via INTRAVENOUS
  Filled 2016-10-11 (×11): qty 150

## 2016-10-11 MED ORDER — INSULIN ASPART 100 UNIT/ML ~~LOC~~ SOLN
12.0000 [IU] | Freq: Three times a day (TID) | SUBCUTANEOUS | Status: DC
  Administered 2016-10-12 – 2016-10-27 (×28): 12 [IU] via SUBCUTANEOUS

## 2016-10-11 MED ORDER — DIPHENHYDRAMINE HCL 25 MG PO CAPS
25.0000 mg | ORAL_CAPSULE | Freq: Once | ORAL | Status: DC
  Filled 2016-10-11: qty 1

## 2016-10-11 MED ORDER — PIPERACILLIN-TAZOBACTAM 3.375 G IVPB
3.3750 g | Freq: Three times a day (TID) | INTRAVENOUS | Status: DC
  Administered 2016-10-11 – 2016-10-16 (×14): 3.375 g via INTRAVENOUS
  Filled 2016-10-11 (×18): qty 50

## 2016-10-11 MED ORDER — PANTOPRAZOLE SODIUM 40 MG IV SOLR
40.0000 mg | Freq: Two times a day (BID) | INTRAVENOUS | Status: DC
  Administered 2016-10-11 – 2016-10-18 (×16): 40 mg via INTRAVENOUS
  Filled 2016-10-11 (×17): qty 40

## 2016-10-11 MED ORDER — FUROSEMIDE 10 MG/ML IJ SOLN: 20.0000 mg | Freq: Once | INTRAMUSCULAR | Status: DC

## 2016-10-11 MED ORDER — SODIUM CHLORIDE 0.9 % IV SOLN: Freq: Once | INTRAVENOUS | Status: DC

## 2016-10-11 MED ORDER — ACETAMINOPHEN 325 MG PO TABS
650.0000 mg | ORAL_TABLET | Freq: Once | ORAL | Status: DC
  Filled 2016-10-11: qty 2

## 2016-10-11 NOTE — Progress Notes (Signed)
Social Work Lucy Chrisebecca G Amreen Raczkowski, LCSW Social Worker Signed   Patient Care Conference Date of Service: 10/11/2016  1:32 PM      Hide copied text Hover for attribution information Inpatient RehabilitationTeam Conference and Plan of Care Update Date: 10/11/2016   Time: 10:00 AM      Patient Name: Douglas [name]      Medical Record Number: [id number]  Date of Birth: 04/12/1941 Sex: Male         Room/Bed: 4W08C/4W08C-01 Payor Info: Payor: MEDICARE / Plan: MEDICARE PART A AND B / Product Type: *No Product type* /     Admitting Diagnosis: CVA  Admit Date/Time:  10/05/2016  6:35 PM Admission Comments: No comment available    Primary Diagnosis:  <principal problem not specified> Principal Problem: <principal problem not specified>       Patient Active Problem List    Diagnosis Date Noted  . Erythema    . Acute lower UTI    . Dysphagia due to recent cerebral infarction    . Hypoalbuminemia due to protein-calorie malnutrition (HCC)    . Thrombocytopenia (HCC)    . Leukocytosis    . Acute blood loss anemia    . Type 2 diabetes mellitus with peripheral neuropathy (HCC)    . Labile blood glucose    . 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 Right hemiparesis (HCC)    . Urinary retention    . Acute delirium    . CKD (chronic kidney disease) 09/27/2016  . Elevated troponin 09/27/2016  . Anemia 09/27/2016  . Acute embolic stroke (HCC)    . Acute systolic heart failure (HCC)    . Benign essential HTN    . Tachycardia    . CVA (cerebral vascular accident) (HCC) 09/26/2016  . Hyperlipidemia 11/30/2013  . Essential hypertension 11/30/2013  . DM (diabetes mellitus) (HCC) 11/30/2013      Expected Discharge Date: Expected Discharge Date: 11/03/16   Team Members Present: Physician leading conference: Dr. Maryla MorrowAnkit Patel Social Worker Present: Dossie DerBecky Khalee Mazo, LCSW Nurse Present: Chana Bodeeborah Sharp, RN PT Present: Teodoro Kilaitlin Penven-Crew, PT OT Present: Roney MansJennifer Smith, OT SLP Present:  Jackalyn LombardNicole Page, SLP PPS Coordinator present : Tora DuckMarie Noel, RN, CRRN       Current Status/Progress Goal Weekly Team Focus  Medical   Right-sided weakness, aphasia, dysphagia secondary to left MCA infarct  Decrease burden of care, improve mobility, cognition, severe ABLA, thrombocytopenia, leukocytosis  See above   Bowel/Bladder   incont of bowel, managed bladder with foley  manage bowel with min assist and total assist of bladder wtih foley  moniitor need for /use laxatives for bowel movement, using briefs for management   Swallow/Nutrition/ Hydration   Dys 1, nectar thick liquids; full supervision   min assist   MBS, toleration of current diet, trials of advanced consistencies to continue working towards diet progression   ADL's     total assist of 2   mod/max assist   awareness, transfers, standing, ADL's  Mobility   total assist>+2 assist  overall mod assist  cognition, awareness, attention, NMR, postural control, transfers, standing   Communication   globally aphasic, max to total assist for functional communication, also limited by lethargy   min-mod assist   automatic sequences, yes/no response accuracy, multimodal communication    Safety/Cognition/ Behavioral Observations limited due to lethargy, max to total assist for basic tasks  min assist   attention, arousal   Pain   n/a  Skin   bruising scattered over body, dry flaky pale skin  bruising improving, no skin integrity breakdown  turn q 2 and monitor skin each shift       *See Care Plan and progress notes for long and short-term goals.   Barriers to Discharge: ABLA, thrombocytopenia, leukocytosis, UTI, global aphasia, DM   Possible Resolutions to Barriers:  GI consult, follow labs, may need Heme/Onc consult, therapies, decrease burden of care, optimize DM meds   Discharge Planning/Teaching Needs:  Pt does not have 24 hr care at home, will need to go to a NH when medically ready for DC from CIR. Niece and nephew are  supportive along with roommate      Team Discussion:  Goals max-total of one person, currently plus two total assist. Pushes to the right. Globally aphasic. MBS tomorrow to see if can advance diet. GI consulted due to low hemoglobin-blood in stools. Limited by fatigue-needs multiple rest breaks. Will be NHP when medically ready to transfer from here, no caregivers.  Revisions to Treatment Plan:  None    Continued Need for Acute Rehabilitation Level of Care: The patient requires daily medical management by a physician with specialized training in physical medicine and rehabilitation for the following conditions: Daily direction of a multidisciplinary physical rehabilitation program to ensure safe treatment while eliciting the highest outcome that is of practical value to the patient.: Yes Daily medical management of patient stability for increased activity during participation in an intensive rehabilitation regime.: Yes Daily analysis of laboratory values and/or radiology reports with any subsequent need for medication adjustment of medical intervention for : Neurological problems;Diabetes problems;Other;Nutritional problems   [name], Rhapsody Wolven G 10/11/2016, 2:33 PM       Patient ID: Douglas [name], male   DOB: 02/06/1941, 75 y.o.   MRN: [id number]

## 2016-10-11 NOTE — Progress Notes (Signed)
Occupational Therapy Session Note  Patient Details  Name: Douglas [name] MRN: [id number] Date of Birth: 08/01/1941  Today's Date: 10/11/2016 OT Individual Time: 1300-1402 OT Individual Time Calculation (min): 62 min     Short Term Goals: Week 1:  OT Short Term Goal 1 (Week 1): Pt will maintain unsupported sitting balance with min assist for 2 mins to engage in self-care task OT Short Term Goal 2 (Week 1): Pt will transfer to toilet with max assist of 1 caregiver OT Short Term Goal 3 (Week 1): Pt will complete sit > stand with max assist of one caregiver to progress towards LB dressing at sit > stand level OT Short Term Goal 4 (Week 1): Pt will complete LB dressing with max assist  OT Short Term Goal 5 (Week 1): Pt will complete 2 grooming tasks with < mod verbal cues due to ideational apraxia  Skilled Therapeutic Interventions/Progress Updates:    Pt completed bathing and dressing in supine position during session.  Mod demonstrational cueing with min assist to complete UB bathing.  He tends to perseverate on washing his hands, requiring cueing to progress to other body parts.  He was able to wash his upper legs and part of his lower legs as well once given demonstrational cueing to continue sequencing down to them.  Min assist for rolling to the left with max assist to roll to the right for washing peri area.  When given deodorant he demonstrated apraxia, attempting to bring the deodorant to his mouth instead of applying under his arms.  Donned hospital gown secondary to going down for x-ray and nursing requesting just gown.   Pt left with NT and transport staff at end of session.    Therapy Documentation Precautions:  Precautions Precautions: Fall Precaution Comments: R side inattention, strong push to R side, posterior lean Restrictions Weight Bearing Restrictions: No  Pain: Pain Assessment Pain Assessment: No/denies pain ADL: See Function Navigator for Current Functional  Status.   Therapy/Group: Individual Therapy  Douglas [name],Ashly OTR/L 10/11/2016, 3:48 PM

## 2016-10-11 NOTE — Progress Notes (Signed)
Social Work Patient ID: Douglas [name], male   DOB: 07-01-1941, 75 y.o.   MRN: [id number]  Met with pt and spoke with niece-[name] to inform of team conference goals mod/max one caregiver and target discharge 1/19. Aware plan to move to the next venue of care 1/19. Pt has no caregiver and will need to go to a NH upon discharge from rehab. Made aware GI consulted for low hemoglobin, to come in today to see pt.   Pt is very fatigued and need rest breaks in between therapy sessions. He is making slow gains and hopefully this will continue. Niece and nephew along with pt's roommate visit and provide emotional support. Continue to work toward discharge and family to visit area NH facilities to Tourist information centre manager their preferences.

## 2016-10-11 NOTE — Progress Notes (Signed)
Speech Language Pathology Daily Session Note  Patient Details  Name: Douglas [name] Kirchner MRN: [id number] Date of Birth: 01/20/1941  Today's Date: 10/11/2016 SLP Individual Time: 1200-1230 SLP Individual Time Calculation (min): 30 min   Short Term Goals: Week 1: SLP Short Term Goal 1 (Week 1): Pt will answer yes/no questions for 75% accuracy with max assist multimodal cues.   SLP Short Term Goal 2 (Week 1): Pt will follow 1 step commands for 75% accuracy with max assist multimodal cues.   SLP Short Term Goal 3 (Week 1): Pt will consume dys 1 textures and nectar thick liquids with mod assist for use of swallowing precautions and minimal overt s/s of aspiration.   SLP Short Term Goal 4 (Week 1): Pt will verbalize at the word level during structured tasks for 50% accuracy with max assist multimodal cues. SLP Short Term Goal 5 (Week 1): Pt will utilize multimodal means of communication to convey needs and wants to caregivers with max assist cues.    Skilled Therapeutic Interventions:Session 1:  Pt was seen for skilled ST targeting dysphagia goals.  Pt consumed dys 1 textures and nectar thick liquids during lunch meal with mod-max assist multimodal cues for use of swallowing precautions.  Pt continues to present with poor containment of boluses with copious anterior labial spillage of materials due to right sided weakness.  Pt also continues to present with poor clearance of boluses from the oral cavity with right sided pocketing of purees.  No coughing was evident during therapy session today; however, objective assessment to determine safest diet consistencies seems warranted at this time due to the severity of pt's deficits and slow progress.  Will plan for MBS tomorrow.  Pt left in wheelchair with bilateral mitts and quick release belt donned.  Continue per current plan of care.       Function:  Eating Eating   Modified Consistency Diet: Yes Eating Assist Level: Helper checks for pocketed  food;Helper scoops food on utensil;Helper brings food to mouth;Help with picking up utensils;Help managing cup/glass;Hand over hand assist           Cognition Comprehension Comprehension assist level: Understands basic 25 - 49% of the time/ requires cueing 50 - 75% of the time  Expression   Expression assist level: Expresses basis less than 25% of the time/requires cueing >75% of the time.  Social Interaction Social Interaction assist level: Interacts appropriately 25 - 49% of time - Needs frequent redirection.  Problem Solving Problem solving assist level: Solves basic 25 - 49% of the time - needs direction more than half the time to initiate, plan or complete simple activities  Memory Memory assist level: Recognizes or recalls less than 25% of the time/requires cueing greater than 75% of the time    Pain Pain Assessment Pain Assessment: No/denies pain  Therapy/Group: Individual Therapy  Lesleyanne Politte, Melanee SpryNicole [name] 10/11/2016, 3:34 PM

## 2016-10-11 NOTE — Progress Notes (Signed)
Physical Therapy Session Note  Patient Details  Name: Douglas CohoJames L Aylesworth MRN: [id number] Date of Birth: 06/20/1941  Today's Date: 10/11/2016 PT Individual Time: 1002-1100 PT Individual Time Calculation (min): 58 min    Short Term Goals:Week 1:  PT Short Term Goal 1 (Week 1): Pt will transition to sitting EOB with +1 assist PT Short Term Goal 2 (Week 1): Pt will transfer to w/c with +1 assist and LRAD PT Short Term Goal 3 (Week 1): Pt will initiate gait training with PT  Skilled Therapeutic Interventions/Progress Updates:   Pt resting in w/c in room on arrival, shakes head no when asked about pain.  Session focus on focused and sustained attention, visual scanning, following 1 step commands, and postural control during simple sorting task and ball toss.  Pt requiring frequent short rest breaks throughout session, though needing longer breaks towards end of session 2/2 fatigue.  Pt requires max to total cues for simple sorting task and total cues to self correct errors.  Pt requiring hand over hand assist fade to max cues to return colored bean bags to container at end of task.  Newman PiesBall toss focus on attention, use of BUEs, visual scanning, and anticipatory postural reactions in semi-supported seated position with min> mod cues and hand over hand to place ball in pt's R hand in 80% of trials.  Pt taken to nursing station at end of session, per safety sheet, QRB in place.    Therapy Documentation Precautions:  Precautions Precautions: Fall Precaution Comments: R side inattention, strong push to R side, posterior lean Restrictions Weight Bearing Restrictions: No    See Function Navigator for Current Functional Status.   Therapy/Group: Individual Therapy  Ladora DanielCaitlin E Penven-Crew 10/11/2016, 10:46 AM

## 2016-10-11 NOTE — Progress Notes (Signed)
SLP Cancellation Note  Patient Details Name: Douglas [name] MRN: [id number] DOB: 05/16/1941   Cancelled treatment:        Attempted to see pt for skilled ST, pt asleep and minimally responsive to voice, light touch, and cold compress.  Per RN, pt's hemoglobin is low and pt is awaiting transfusion.  As a result, pt missed 30 minutes of skilled ST due to fatigue and medical work up.  Will continue efforts at next available appointment.                                                                                                  Samiel Peel, Melanee SpryNicole L 10/11/2016, 3:35 PM

## 2016-10-11 NOTE — Discharge Instructions (Signed)
Inpatient Rehab Discharge Instructions  Osborn CohoJames L Cavey Discharge date and time:    Activities/Precautions/ Functional Status: Activity: no lifting, driving, or strenuous exercise for till cleared by MD Diet:  Wound Care:   Functional status:  ___ No restrictions     ___ Walk up steps independently ___ 24/7 supervision/assistance   ___ Walk up steps with assistance ___ Intermittent supervision/assistance  ___ Bathe/dress independently ___ Walk with walker     ___ Bathe/dress with assistance ___ Walk Independently    ___ Shower independently ___ Walk with assistance    ___ Shower with assistance ___ No alcohol     ___ Return to work/school ________  Special Instructions:    My questions have been answered and I understand these instructions. I will adhere to these goals and the provided educational materials after my discharge from the hospital.  Patient/Caregiver Signature _______________________________ Date __________  Clinician Signature _______________________________________ Date __________  Please bring this form and your medication list with you to all your follow-up doctor's appointments.

## 2016-10-11 NOTE — Consult Note (Addendum)
[place]  Telephone:(336) [place]   Douglas [name]  DOB: 1941-03-11  MR#: [id number]  CSN#: [id number]    Requesting Physician: Dr. Posey Pronto   Patient Care Team: Darcus Austin, MD as PCP - General (Family Medicine)  Reason for consult: Thrombocytopenia  History of present illness: 75 yo male with history of diabetes mellitus,Remote tobacco and alcohol use, hypertension, hyperlipidemia, chronic kidney disease who was recently admitted on 09/26/2016 for left MCA ischemic stroke. He has residual severe aphasia and right-sided weakness. He was started on aspirin 325 mg daily and Plavix after admission. He was discharged from hospital and admitted to rehabilitation at [facility] on on 10/05/2016 for rehabilitation. I was called to the patient for his worsening anemia and thrombocytopenia. History of most from his chart, patient has aphasia, not able to communicate well.   His CBC on 09/28/2016 showed Hb 9.0, MCV 76, and plt 216. His Hb has been gradually dropping since admission, and was 6.1 this morning. His plt started dropping around 12/11, and down to 44K this morning. He has no significant bleeding, except mild ecchymosis on his right forearm. No overt GI bleeding, his stool OB was positive. CT abdomen and pelvis was obtained today which showed no evidence of internal bleeding. Blood transfusion ordered a testosterone, however the patient refused and became combative.   MEDICAL HISTORY:  Past Medical History:  Diagnosis Date  . CKD (chronic kidney disease)   . DM (diabetes mellitus) ([place])   . Hyperlipidemia   . Hypertension     SURGICAL HISTORY: No past surgical history on file.  SOCIAL HISTORY: Social History   Social History  . Marital status: Single    Spouse name: N/A  . Number of children: N/A  . Years of education: N/A   Occupational History  . retired    Social History Main Topics  . Smoking status: Former  Smoker    Quit date: 06/30/1982  . Smokeless tobacco: Never Used     Comment: maybe not?  Marland Kitchen Alcohol use Yes     Comment: occasional  . Drug use: No  . Sexual activity: Not on file   Other Topics Concern  . Not on file   Social History Narrative  . No narrative on file    FAMILY HISTORY: Family History  Problem Relation Age of Onset  . Heart disease Mother   . Cancer Father   . Stroke Brother 90    ALLERGIES:  has No Known Allergies.  MEDICATIONS:  Current Facility-Administered Medications  Medication Dose Route Frequency Provider Last Rate Last Dose  . 0.45 % sodium chloride infusion   Intravenous Continuous Lavon Paganini Angiulli, PA-C 75 mL/hr at 10/10/16 1933    . 0.9 %  sodium chloride infusion   Intravenous Once Pamela S Love, PA-C      . acetaminophen (TYLENOL) tablet 650 mg  650 mg Oral Q4H PRN Lavon Paganini Angiulli, PA-C   650 mg at 10/08/16 0035  . acetaminophen (TYLENOL) tablet 650 mg  650 mg Oral Once Pamela S Love, PA-C      . aspirin tablet 325 mg  325 mg Oral Daily Lavon Paganini Angiulli, PA-C   325 mg at 10/11/16 0827  . atorvastatin (LIPITOR) tablet 80 mg  80 mg Oral q1800 Lavon Paganini Angiulli, PA-C   80 mg at 10/10/16 2137  . clopidogrel (PLAVIX) tablet 75 mg  75 mg Oral Daily Lavon Paganini Angiulli, PA-C   75  mg at 10/11/16 0827  . diphenhydrAMINE (BENADRYL) capsule 25 mg  25 mg Oral Once Pamela S Love, PA-C      . feeding supplement (PRO-STAT SUGAR FREE 64) liquid 30 mL  30 mL Oral BID Ankit Lorie Phenix, MD   30 mL at 10/11/16 4656  . furosemide (LASIX) injection 20 mg  20 mg Intravenous Once Pamela S Love, PA-C      . insulin aspart (novoLOG) injection 0-15 Units  0-15 Units Subcutaneous TID WC Lavon Paganini Angiulli, PA-C   3 Units at 10/11/16 1726  . insulin aspart (novoLOG) injection 12 Units  12 Units Subcutaneous TID WC Ankit Lorie Phenix, MD      . insulin glargine (LANTUS) injection 16 Units  16 Units Subcutaneous Daily Ankit Lorie Phenix, MD   16 Units at 10/11/16 0827  .  levothyroxine (SYNTHROID, LEVOTHROID) tablet 75 mcg  75 mcg Oral QAC breakfast Lavon Paganini Angiulli, PA-C   75 mcg at 10/11/16 8127  . MEDLINE mouth rinse  15 mL Mouth Rinse BID Charlett Blake, MD   15 mL at 10/11/16 5170  . metoprolol tartrate (LOPRESSOR) tablet 12.5 mg  12.5 mg Oral BID Lavon Paganini Angiulli, PA-C   12.5 mg at 10/11/16 0827  . ondansetron (ZOFRAN) tablet 4 mg  4 mg Oral Q6H PRN Lavon Paganini Angiulli, PA-C       Or  . ondansetron (ZOFRAN) injection 4 mg  4 mg Intravenous Q6H PRN Daniel J Angiulli, PA-C      . pantoprazole (PROTONIX) injection 40 mg  40 mg Intravenous Q12H Pamela S Love, PA-C   40 mg at 10/11/16 0958  . piperacillin-tazobactam (ZOSYN) IVPB 3.375 g  3.375 g Intravenous Q8H Charlett Blake, MD      . RESOURCE THICKENUP CLEAR   Oral PRN Lavon Paganini Angiulli, PA-C      . sorbitol 70 % solution 30 mL  30 mL Oral Daily PRN Lavon Paganini Angiulli, PA-C      . Valproate Sodium (DEPAKENE) solution 250 mg  250 mg Oral BID Lavon Paganini Angiulli, PA-C   250 mg at 10/11/16 0827  . vancomycin (VANCOCIN) IVPB 750 mg/150 ml premix  750 mg Intravenous Q12H Charlett Blake, MD   750 mg at 10/11/16 1626    REVIEW OF SYSTEMS:   Constitutional: Denies fevers, chills or abnormal night sweats Eyes: Denies blurriness of vision, double vision or watery eyes Ears, nose, mouth, throat, and face: Denies mucositis or sore throat Respiratory: Denies cough, dyspnea or wheezes Cardiovascular: Denies palpitation, chest discomfort or lower extremity swelling Gastrointestinal:  Denies nausea, heartburn or change in bowel habits Skin: Denies abnormal skin rashes Lymphatics: Denies new lymphadenopathy or easy bruising Neurological:Denies numbness, tingling or new weaknesses Behavioral/Psych: Mood is stable, no new changes  All other systems were reviewed with the patient and are negative.  PHYSICAL EXAMINATION: ECOG PERFORMANCE STATUS: 3 - Symptomatic, >50% confined to bed  Vitals:   10/11/16 1407  10/11/16 1455  BP: (!) 94/54 (!) 110/50  Pulse: 96 98  Resp: 17   Temp: 97.6 F (36.4 C)    Filed Weights   10/11/16 1500  Weight: 159 lb 6.3 oz (72.3 kg)    GENERAL:alert, no distress and comfortable, aphasic, but follow commands. SKIN: skin color, texture, turgor are normal, no rashes or significant lesions except ecchymosis in his right forearm. No petechia EYES: normal, conjunctiva are pink and non-injected, sclera clear OROPHARYNX:no exudate, no erythema and lips, buccal mucosa, and tongue normal  NECK: supple, thyroid normal size, non-tender, without nodularity LYMPH:  no palpable lymphadenopathy in the cervical, axillary or inguinal LUNGS: clear to auscultation and percussion with normal breathing effort HEART: regular rate & rhythm and no murmurs and no lower extremity edema ABDOMEN:abdomen soft, non-tender and normal bowel sounds Musculoskeletal:no cyanosis of digits and no clubbing  PSYCH: alert & oriented x 3 with fluent speech   LABORATORY DATA:  I have reviewed the data as listed Lab Results  Component Value Date   WBC 21.6 (H) 10/11/2016   HGB 6.3 (LL) 10/11/2016   HCT 21.8 (L) 10/11/2016   MCV 82.9 10/11/2016   PLT 42 (L) 10/11/2016    Recent Labs  09/26/16 1800  10/06/16 0554 10/10/16 0815 10/11/16 0750  NA 138  < > 143 136 137  K 3.7  < > 3.7 3.8 4.0  CL 105  < > 108 105 106  CO2 21*  < > _0 GLUCOSE 89  < > 197* 202* 192*  BUN 18  < > 26* 22* 23*  CREATININE 1.62*  < > 1.31* 1.16 1.13  CALCIUM 9.4  < > 9.4 8.3* 8.3*  GFRNONAA 40*  < > 52* 60* >60  GFRAA 46*  < > 60* >60 >60  PROT 6.7  --  6.4*  --   --   ALBUMIN 3.3*  --  2.4*  --   --   AST 31  --  64*  --   --   ALT 15*  --  69*  --   --   ALKPHOS 135*  --  315*  --   --   BILITOT 1.1  --  1.0  --   --   < > = values in this interval not displayed.   RADIOGRAPHIC STUDIES: I have personally reviewed the radiological images as listed and agreed with the findings in the report. Ct  Abdomen Pelvis Wo Contrast  Result Date: 10/11/2016 CLINICAL DATA:  Recent hospitalization for stroke, on Plavix therapy. Progressive anemia with hemoccult positive stool. Question retroperitoneal bleed. EXAM: CT ABDOMEN AND PELVIS WITHOUT CONTRAST TECHNIQUE: Multidetector CT imaging of the abdomen and pelvis was performed following the standard protocol without IV contrast. COMPARISON:  Renal ultrasound 09/27/2016. Abdominal MRI 07/12/2010, chest x-ray 10/11/2016. FINDINGS: Lower chest: Small dependent right pleural effusion. There is prominence of the right hilum with patchy right infrahilar opacity. The heart size is normal. There is coronary artery atherosclerosis. Bilateral gynecomastia noted. Hepatobiliary: As evaluated in the noncontrast state, the liver appears unremarkable. No evidence of gallstones, gallbladder wall thickening or biliary dilatation. Pancreas: Unremarkable. No pancreatic ductal dilatation or surrounding inflammatory changes. Spleen: Normal in size without focal abnormality. Adrenals/Urinary Tract: Both adrenal glands appear normal. 2 cm low-density lesion in the upper pole the right kidney on image 27 corresponds with a cyst on ultrasound. No evidence of urinary tract calculus, hydronephrosis or perinephric soft tissue stranding. Foley catheter is in place. There is mild bladder wall thickening. Gas in the bladder lumen attributed to the Foley. Stomach/Bowel: Bowel assessment limited by the lack of intravenous and enteric contrast. No evidence of bowel wall thickening, distention or surrounding inflammatory change. Diverticular changes throughout the distal colon, greatest within the sigmoid colon. The appendix appears normal. Vascular/Lymphatic: There are no enlarged abdominal or pelvic lymph nodes. Aortic and branch vessel atherosclerosis. Reproductive: Moderate enlargement of the prostate gland. Other: No evidence of retroperitoneal hematoma. No evidence of ascites or hemoperitoneum.  Musculoskeletal: No acute or significant  osseous findings. Mild lumbar spondylosis. IMPRESSION: 1. No evidence of retroperitoneal hematoma or other acute findings. 2. No clear explanation for anemia or heme-positive stool on noncontrast imaging. Further bowel evaluation may be warranted. 3. Small right pleural effusion with right infrahilar opacity and prominence of the right hilum, seen on radiographs today. Chest radiographic follow up recommended to exclude thoracic neoplasm. 4. Additional incidental findings, including a stable right renal cyst, bladder wall thickening, colonic diverticulosis and aortic atherosclerosis. Electronically Signed   By: Richardean Sale M.D.   On: 10/11/2016 16:47   Dg Chest 2 View  Result Date: 10/11/2016 CLINICAL DATA:  Leukocytosis EXAM: CHEST  2 VIEW COMPARISON:  October 06, 2016 FINDINGS: There is patchy infiltrate in the right middle lobe region, new. A small amount of patchy opacity in the anterior segment of the right upper lobe is stable. Left lung is clear. Heart size and pulmonary vascularity are normal. No appreciable adenopathy. No bone lesions. There is calcification in the ascending thoracic aorta. IMPRESSION: Pain patchy infiltrate right middle lobe. Stable patchy infiltrate anterior segment right upper lobe. Left lung clear. Stable cardiac silhouette. Aortic atherosclerosis. Electronically Signed   By: Lowella Grip III M.D.   On: 10/11/2016 14:38   Dg Chest 2 View  Result Date: 10/06/2016 CLINICAL DATA:  Dysphagia due to infarct EXAM: CHEST  2 VIEW COMPARISON:  09/29/2016, 09/26/2016 FINDINGS: Low lung volumes. Persistent patchy airspace opacities in the right upper lobe, right perihilar lung and right base. No pleural effusion. Stable mild cardiomegaly. No pneumothorax. IMPRESSION: Grossly unchanged patchy airspace opacities in the right upper lobe and right lower lobe which may represent infiltrates, continued radiographic follow-up to clearing is  advised. Stable cardiomegaly. Electronically Signed   By: Donavan Foil M.D.   On: 10/06/2016 18:17   Dg Chest 2 View  Result Date: 09/26/2016 CLINICAL DATA:  Altered mental status. EXAM: CHEST  2 VIEW COMPARISON:  None. FINDINGS: Mild cardiomegaly is noted. No pneumothorax or pleural effusion is noted. Left lung is clear. Ill-defined opacity is seen in the right lung concerning for possible pneumonia or edema. Bony thorax is unremarkable. IMPRESSION: Ill-defined opacity seen in right lung concerning for possible pneumonia or edema. Electronically Signed   By: Marijo Conception, M.D.   On: 09/26/2016 19:08   Ct Head Wo Contrast  Result Date: 09/29/2016 CLINICAL DATA:  Reason CVA.  Intermittent apnea EXAM: CT HEAD WITHOUT CONTRAST TECHNIQUE: Contiguous axial images were obtained from the base of the skull through the vertex without intravenous contrast. COMPARISON:  Brain MRI September 26, 2016 FINDINGS: Brain: There is mild diffuse atrophy. There has been evolution of infarct in the left middle cerebral artery distribution with cytotoxic edema in the posterior left fall and much of the left superior temporal lobe regions. There is no appreciable hemorrhage or midline shift. No mass seen. There is no subdural or epidural fluid collections. There is also an infarct in the posteromedial right cerebellum which appears larger than on recent MR. There is no appreciable mass effect from this fairly small but present infarct. Several tiny lacunar type infarcts in the posterior circulation are not evident by CT. Elsewhere, there is patchy small vessel disease in the centra semiovale bilaterally. No new infarct is evident compared to 3 days prior. Vascular: Increased attenuation is noted in the periphery of the left middle cerebral artery consistent with infarct in the left middle cerebral artery distribution, likely a small focus of thrombus in the distal left middle cerebral artery. No  hyperdense vessel. There are  foci of calcification in each carotid siphon. Skull: The bony calvarium appears intact. Sinuses/Orbits: Paranasal sinuses are clear. Orbits appear symmetric bilaterally. Other: There is opacification in several inferior mastoid air cells on the left. Mastoids elsewhere clear. 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 IMPRESSION: Evolution of left middle cerebral artery distribution infarct in the left posterior frontal and superior left temporal regions with increase in cytotoxic edema in this area. There is localized mass effect from the cytotoxic edema but no midline shift. There is a small infarct in the right cerebellum medially and posteriorly showing evolution from recent study. No hemorrhage. Note that there is hyperdensity in the distal left middle cerebral artery, likely thrombus in this region. There is mild atrophy with mild periventricular small vessel disease. There is inferior left mastoid air cell disease. There is advanced arthropathy in both temporomandibular joints. Electronically Signed   By: Lowella Grip III M.D.   On: 09/29/2016 10:02   Ct Head Wo Contrast  Result Date: 09/26/2016 CLINICAL DATA:  Found down on floor today.  Aphasia EXAM: CT HEAD WITHOUT CONTRAST TECHNIQUE: Contiguous axial images were obtained from the base of the skull through the vertex without intravenous contrast. COMPARISON:  None. FINDINGS: Brain: There is no acute hemorrhage. No focal mass lesion. Suspected loss of the gray-white matter differentiation involving the left insula, inferior frontal, and anterior temporal lobes. Moderate atrophy. Ventricles slightly enlarged but felt secondary to atrophy. Vascular: Carotid artery calcifications. Questionable mildly dense distal left MCA. Skull: Mastoid air cells are clear. No fracture. Prominent mandibular condyles with degenerative changes. Sinuses/Orbits: No acute fluid levels in the  sinuses. Mild mucosal thickening in the ethmoid sinuses. No acute orbital abnormality. Other: None IMPRESSION: 1. Suspect edema within the left insular cortex, anterior temporal lobe and left inferior frontal lobe. MRI is recommended for further evaluation. Possible mildly dense distal left MCA artery. 2. No hemorrhage. 3. Atrophy Electronically Signed   By: Donavan Foil M.D.   On: 09/26/2016 21:04   Mr Angiogram Head Wo Contrast  Result Date: 09/27/2016 CLINICAL DATA:  Right-sided paralysis and aphasia. EXAM: MRI HEAD WITHOUT AND WITH CONTRAST MRA HEAD WITHOUT CONTRAST MRA NECK WITHOUT AND WITH CONTRAST TECHNIQUE: Multiplanar, multiecho pulse sequences of the brain and surrounding structures were obtained without and with intravenous contrast. Angiographic images of the Circle of Willis were obtained using MRA technique without intravenous contrast. Angiographic images of the neck were obtained using MRA technique without and with intravenous contrast. Carotid stenosis measurements (when applicable) are obtained utilizing NASCET criteria, using the distal internal carotid diameter as the denominator. CONTRAST:  15 mL MultiHance IV COMPARISON:  Head CT 09/26/2016 FINDINGS: MRI HEAD FINDINGS Brain: The midline structures are normal. There is extensive diffusion restriction in the left insular cortex and frontal operculum. There are additional small foci of diffusion restriction scattered throughout both cerebral hemispheres and both sides of the cerebellum. There is no midline shift or significant mass effect. No evidence of acute hemorrhage. There is multifocal hyperintense T2-weighted signal within the periventricular white matter, most often seen in the setting of chronic microvascular ischemia. No hydrocephalus or extra-axial fluid collection. No lobar predominant atrophy pattern. There are areas of contrast enhancement within the cerebellum. Within the peripheral aspect of the left hemisphere, in the area  of peripheral contrast enhancement measures 1.9 x 1.1 cm. A smaller focus near the cerebellar midline measures 4 mm. There is  an additional focus of contrast enhancement along the right precentral gyrus. Vascular: Major intracranial arterial and venous sinus flow voids are preserved. Area of hemosiderin deposition in the left cerebellar hemisphere. There is focal susceptibility in the location of the left MCA bifurcation, which corresponds to the occlusion demonstrated on time-of-flight images. Skull and upper cervical spine: The visualized skull base, calvarium, upper cervical spine and extracranial soft tissues are normal. Sinuses/Orbits: No fluid levels or advanced mucosal thickening. No mastoid effusion. Normal orbits. MRA HEAD FINDINGS Intracranial internal carotid arteries: Normal. Anterior cerebral arteries: Normal. Middle cerebral arteries: There is occlusion of the left middle cerebral artery at the site of the bifurcation. There is minimal flow related enhancement seen within the distal left MCA distribution. The right MCA is normal. Posterior communicating arteries: Present bilaterally. Posterior cerebral arteries: Normal. Basilar artery: Normal. Vertebral arteries: Codominant. Normal. Superior cerebellar arteries: Normal. Anterior inferior cerebellar arteries: Normal. Posterior inferior cerebellar arteries: Normal. MRA NECK FINDINGS There is a normal 3 vessel aortic arch branch pattern. The bilateral subclavian arteries are normal. There is approximately 30% stenosis at the proximal left internal carotid artery. No right carotid stenosis. The cervical courses of both vertebral arteries are normal. IMPRESSION: 1. 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 Multiple other punctate foci of acute ischemia scattered throughout both cerebral and cerebellar hemispheres suggest a central embolic process. No hemorrhage or mass effect. 2. Occlusion of the left MCA at the level of the  bifurcation. 3. Multiple foci of contrast enhancement within the cerebellum and right frontal lobe, with the largest focus measuring up to 2 cm. Given the other findings, these findings may be secondary to subacute ischemia. However, follow-up imaging (MRI brain with and without contrast) is recommended in 4-6 weeks to exclude the possibility of a neoplastic process, primarily metastatic disease. 4. 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 Otherwise normal MRA of the neck. Critical Value/emergent results were called by telephone at the time of interpretation on 09/27/2016 at 12:08 am to [facility], [place] who verbally acknowledged these results. Electronically Signed   By: Ulyses Jarred M.D.   On: 09/27/2016 00:18   Mr Angiogram Neck W Or Wo Contrast  Result Date: 09/27/2016 CLINICAL DATA:  Right-sided paralysis and aphasia. EXAM: MRI HEAD WITHOUT AND WITH CONTRAST MRA HEAD WITHOUT CONTRAST MRA NECK WITHOUT AND WITH CONTRAST TECHNIQUE: Multiplanar, multiecho pulse sequences of the brain and surrounding structures were obtained without and with intravenous contrast. Angiographic images of the Circle of Willis were obtained using MRA technique without intravenous contrast. Angiographic images of the neck were obtained using MRA technique without and with intravenous contrast. Carotid stenosis measurements (when applicable) are obtained utilizing NASCET criteria, using the distal internal carotid diameter as the denominator. CONTRAST:  15 mL MultiHance IV COMPARISON:  Head CT 09/26/2016 FINDINGS: MRI HEAD FINDINGS Brain: The midline structures are normal. There is extensive diffusion restriction in the left insular cortex and frontal operculum. There are additional small foci of diffusion restriction scattered throughout both cerebral hemispheres and both sides of the cerebellum. There is no midline shift or significant mass effect. No evidence of acute hemorrhage. There is multifocal  hyperintense T2-weighted signal within the periventricular white matter, most often seen in the setting of chronic microvascular ischemia. No hydrocephalus or extra-axial fluid collection. No lobar predominant atrophy pattern. There are areas of contrast enhancement within the cerebellum. Within the peripheral aspect of the left hemisphere, in the area of peripheral contrast enhancement  measures 1.9 x 1.1 cm. A smaller focus near the cerebellar midline measures 4 mm. There is an additional focus of contrast enhancement along the right precentral gyrus. Vascular: Major intracranial arterial and venous sinus flow voids are preserved. Area of hemosiderin deposition in the left cerebellar hemisphere. There is focal susceptibility in the location of the left MCA bifurcation, which corresponds to the occlusion demonstrated on time-of-flight images. Skull and upper cervical spine: The visualized skull base, calvarium, upper cervical spine and extracranial soft tissues are normal. Sinuses/Orbits: No fluid levels or advanced mucosal thickening. No mastoid effusion. Normal orbits. MRA HEAD FINDINGS Intracranial internal carotid arteries: Normal. Anterior cerebral arteries: Normal. Middle cerebral arteries: There is occlusion of the left middle cerebral artery at the site of the bifurcation. There is minimal flow related enhancement seen within the distal left MCA distribution. The right MCA is normal. Posterior communicating arteries: Present bilaterally. Posterior cerebral arteries: Normal. Basilar artery: Normal. Vertebral arteries: Codominant. Normal. Superior cerebellar arteries: Normal. Anterior inferior cerebellar arteries: Normal. Posterior inferior cerebellar arteries: Normal. MRA NECK FINDINGS There is a normal 3 vessel aortic arch branch pattern. The bilateral subclavian arteries are normal. There is approximately 30% stenosis at the proximal left internal carotid artery. No right carotid stenosis. The cervical  courses of both vertebral arteries are normal. IMPRESSION: 1. 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 Multiple other punctate foci of acute ischemia scattered throughout both cerebral and cerebellar hemispheres suggest a central embolic process. No hemorrhage or mass effect. 2. 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 3. Multiple foci of contrast enhancement within the cerebellum and right frontal lobe, with the largest focus measuring up to 2 cm. Given the other findings, these findings may be secondary to subacute ischemia. However, follow-up imaging (MRI brain with and without contrast) is recommended in 4-6 weeks to exclude the possibility of a neoplastic process, primarily metastatic disease. 4. 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 Otherwise normal MRA of the neck. Critical Value/emergent results were called by telephone at the time of interpretation on 09/27/2016 at 12:08 am to [facility], [place] who verbally acknowledged these results. Electronically Signed   By: Ulyses Jarred M.D.   On: 09/27/2016 00:18   Mr Jeri Cos ST Contrast  Result Date: 09/27/2016 CLINICAL DATA:  Right-sided paralysis and aphasia. EXAM: MRI HEAD WITHOUT AND WITH CONTRAST MRA HEAD WITHOUT CONTRAST MRA NECK WITHOUT AND WITH CONTRAST TECHNIQUE: Multiplanar, multiecho pulse sequences of the brain and surrounding structures were obtained without and with intravenous contrast. Angiographic images of the Circle of Willis were obtained using MRA technique without intravenous contrast. Angiographic images of the neck were obtained using MRA technique without and with intravenous contrast. Carotid stenosis measurements (when applicable) are obtained utilizing NASCET criteria, using the distal internal carotid diameter as the denominator. CONTRAST:  15 mL MultiHance IV COMPARISON:  Head CT 09/26/2016 FINDINGS: MRI HEAD FINDINGS Brain: The midline structures  are normal. There is extensive diffusion restriction in the left insular cortex and frontal operculum. There are additional small foci of diffusion restriction scattered throughout both cerebral hemispheres and both sides of the cerebellum. There is no midline shift or significant mass effect. No evidence of acute hemorrhage. There is multifocal hyperintense T2-weighted signal within the periventricular white matter, most often seen in the setting of chronic microvascular ischemia. No hydrocephalus or extra-axial fluid collection. No lobar predominant atrophy pattern. There are areas of contrast enhancement within the cerebellum.  Within the peripheral aspect of the left hemisphere, in the area of peripheral contrast enhancement measures 1.9 x 1.1 cm. A smaller focus near the cerebellar midline measures 4 mm. There is an additional focus of contrast enhancement along the right precentral gyrus. Vascular: Major intracranial arterial and venous sinus flow voids are preserved. Area of hemosiderin deposition in the left cerebellar hemisphere. There is focal susceptibility in the location of the left MCA bifurcation, which corresponds to the occlusion demonstrated on time-of-flight images. Skull and upper cervical spine: The visualized skull base, calvarium, upper cervical spine and extracranial soft tissues are normal. Sinuses/Orbits: No fluid levels or advanced mucosal thickening. No mastoid effusion. Normal orbits. MRA HEAD FINDINGS Intracranial internal carotid arteries: Normal. Anterior cerebral arteries: Normal. Middle cerebral arteries: There is occlusion of the left middle cerebral artery at the site of the bifurcation. There is minimal flow related enhancement seen within the distal left MCA distribution. The right MCA is normal. Posterior communicating arteries: Present bilaterally. Posterior cerebral arteries: Normal. Basilar artery: Normal. Vertebral arteries: Codominant. Normal. Superior cerebellar arteries:  Normal. Anterior inferior cerebellar arteries: Normal. Posterior inferior cerebellar arteries: Normal. MRA NECK FINDINGS There is a normal 3 vessel aortic arch branch pattern. The bilateral subclavian arteries are normal. There is approximately 30% stenosis at the proximal left internal carotid artery. No right carotid stenosis. The cervical courses of both vertebral arteries are normal. IMPRESSION: 1. 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 Multiple other punctate foci of acute ischemia scattered throughout both cerebral and cerebellar hemispheres suggest a central embolic process. No hemorrhage or mass effect. 2. 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 3. Multiple foci of contrast enhancement within the cerebellum and right frontal lobe, with the largest focus measuring up to 2 cm. Given the other findings, these findings may be secondary to subacute ischemia. However, follow-up imaging (MRI brain with and without contrast) is recommended in 4-6 weeks to exclude the possibility of a neoplastic process, primarily metastatic disease. 4. 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 Otherwise normal MRA of the neck. Critical Value/emergent results were called by telephone at the time of interpretation on 09/27/2016 at 12:08 am to [facility], [place] who verbally acknowledged these results. Electronically Signed   By: Ulyses Jarred M.D.   On: 09/27/2016 00:18   US Renal  Result Date: 09/27/2016 CLINICAL DATA:  Urinary retention, hypertension, diabetes mellitus, chronic kidney disease EXAM: RENAL / URINARY TRACT ULTRASOUND COMPLETE COMPARISON:  Ultrasound abdomen 10/08/2015 FINDINGS: Right Kidney: Length: 5.1 cm. Cortical thinning. Upper normal cortical echogenicity. Small probable cyst at upper pole RIGHT kidney 1.6 x 1.8 x 2.0 cm, measured 2.7 x 1.8 x 2.0 cm on 10/08/2015. No additional mass, hydronephrosis or shadowing calcification.  Left Kidney: Length: By 0.7 cm. Diffuse cortical thinning. Normal cortical echogenicity. No mass, hydronephrosis or shadowing calcification. Bladder: Decompressed by Foley catheter, unable to evaluate. IMPRESSION: Interval decrease in size of a small cyst at the upper pole RIGHT kidney. Age-related cortical atrophy without evidence of additional renal mass or hydronephrosis. Electronically Signed   By: Lavonia Dana M.D.   On: 09/27/2016 16:55   Dg Chest Port 1 View  Result Date: 09/29/2016 CLINICAL DATA:  Transesophageal echocardiogram, decreased responsiveness, apnea, desaturation, code stroke. EXAM: PORTABLE CHEST 1 VIEW COMPARISON:  09/26/2016. FINDINGS: Trachea is midline. Heart size stable. Mild patchy airspace opacification in the right perihilar region. Lungs are low in volume. Minimal bibasilar atelectasis. No pleural  fluid. Left apical pleural thickening. IMPRESSION: Minimal right perihilar airspace opacification may be due to pneumonia. Her Followup PA and lateral chest X-ray is recommended in 3-4 weeks following trial of antibiotic therapy to ensure resolution and exclude underlying malignancy. Electronically Signed   By: Lorin Picket M.D.   On: 09/29/2016 12:26    ASSESSMENT & PLAN: 75 yo male with history of diabetes mellitus,Remote tobacco and alcohol use, hypertension, hyperlipidemia, chronic kidney disease who was recently admitted on 09/26/2016 for left MCA ischemic stroke. He has residual severe aphasia and right-sided weakness  1. Recent left MCA ischemic stroke  2. Worsening anemia, iron deficient anemia 3. Newly thrombocytopenia, rule out HIT  4. ? DIC  5. DM 6. Hyperlipidemia 7. Right pneumonia, on cipro 12/24-12/27, changed to vanco and zosyn today    Recommendations: -I have ordered some lab this afternoon. He has elevated ret count, LDH and elevated d-dimer, normal PT/APTT, haptoglobin is pending, the above tests supports hemolysis,some evidence of DIC -I reviewed his  peripheral smear, which showed occasional schistocytes (a few/hp) -I suspect he has DIC secondary to his large ischemic stroke and brain infarct and or pneumonia, I am much less concerned about TTP  -I will check coomb's test, and [id number] to rule out autoimmune hemolysis and TTP  -HIT needs to be rule out, SRA was sent, result is pending  -his medications (ASA and plavix) may also contribute to his thromboyctopenia, I will discuss with neurologist Dr. Erlinda Hong if it's OK to hold one of them or both until his plt recovers  -I agree with RBC blood transfusion to keep Hb above 8, pt refused today, his nurse will try again tomorrow morning  -No need for plt transfusion for now, unless active bleeding or plt<20  -I will be out of office for the rest of this week, my colleague Dr. Alvy Bimler will follow up tomorrow.   Thank you for the opportunity to participate this gentleman's care.      Truitt Merle, MD 10/11/2016 5:38 PM

## 2016-10-11 NOTE — Progress Notes (Addendum)
Reviewed HGB level with patient  And plan for transfusion of 2 unit PRBs. PT became very animated, sat up in bed waving arms and stating "no,". REviewed that we had spoken with Harriett SineNancy about the transfusion and the patient still waved off staff, hitting at IV nurse to not let her start another IV access for the blood. PT stating with garbled speech however made clear he did not want blood administered and [name] could not speak for him. Pt stating no...not, waving arms, pushing people away, shaking head side to side and becoming very angry, attempting to hit staff who attempted to touch pt or arms.  The  IV nurse and two NTs in room with this writer during the episode. Sent pt to CT scan for pelvis assessment.Upon return to room, noted Brett CanalesSteve roommate and reviewed plan of care. Pt still refused transfusion however is willing to have abx administered. Permit for blood transfusion signed/cosigned by another rn with permission by Harriett SineNancy over the phone. Explained what had happened to CodySteve and the patient just shook head "no" when asked why he did not want to get the blood?. PT attempted to speak and stated "no" clearly after being told of the situation and rationale for the order. Marissa NestlePam Love PAC made aware of the situation and the patient's response.Asked the patient and roommate re religious or cultural issues that would inhibit pt from receiving blood and both noted no. Pamelia HoitSharp, Aalliyah Kilker B

## 2016-10-11 NOTE — Progress Notes (Signed)
Subjective/Complaints: Pt sitting up in bed this AM.  He is more alert.  No reported issues.    Review of systems: Unable to obtain secondary to aphasia  Objective: Vital Signs: Blood pressure (!) 138/48, pulse (!) 101, temperature 97.5 F (36.4 C), temperature source Oral, resp. rate 16, SpO2 97 %. No results found. Results for orders placed or performed during the hospital encounter of 10/05/16 (from the past 72 hour(s))  Glucose, capillary     Status: Abnormal   Collection Time: 10/08/16 11:41 AM  Result Value Ref Range   Glucose-Capillary 332 (H) 65 - 99 mg/dL  Glucose, capillary     Status: Abnormal   Collection Time: 10/08/16  4:53 PM  Result Value Ref Range   Glucose-Capillary 282 (H) 65 - 99 mg/dL  Glucose, capillary     Status: Abnormal   Collection Time: 10/08/16  8:46 PM  Result Value Ref Range   Glucose-Capillary 271 (H) 65 - 99 mg/dL  Glucose, capillary     Status: Abnormal   Collection Time: 10/09/16  6:30 AM  Result Value Ref Range   Glucose-Capillary 165 (H) 65 - 99 mg/dL  Glucose, capillary     Status: Abnormal   Collection Time: 10/09/16 11:31 AM  Result Value Ref Range   Glucose-Capillary 242 (H) 65 - 99 mg/dL  Occult blood card to lab, stool RN will collect     Status: Abnormal   Collection Time: 10/09/16 12:55 PM  Result Value Ref Range   Fecal Occult Bld POSITIVE (A) NEGATIVE  Glucose, capillary     Status: Abnormal   Collection Time: 10/09/16  4:42 PM  Result Value Ref Range   Glucose-Capillary 298 (H) 65 - 99 mg/dL  Glucose, capillary     Status: Abnormal   Collection Time: 10/09/16  8:52 PM  Result Value Ref Range   Glucose-Capillary 194 (H) 65 - 99 mg/dL  Occult blood card to lab, stool RN will collect     Status: Abnormal   Collection Time: 10/10/16 12:23 AM  Result Value Ref Range   Fecal Occult Bld POSITIVE (A) NEGATIVE  Glucose, capillary     Status: Abnormal   Collection Time: 10/10/16  6:35 AM  Result Value Ref Range    Glucose-Capillary 181 (H) 65 - 99 mg/dL  Basic metabolic panel     Status: Abnormal   Collection Time: 10/10/16  8:15 AM  Result Value Ref Range   Sodium 136 135 - 145 mmol/L   Potassium 3.8 3.5 - 5.1 mmol/L   Chloride 105 101 - 111 mmol/L   CO2 24 22 - 32 mmol/L   Glucose, Bld 202 (H) 65 - 99 mg/dL   BUN 22 (H) 6 - 20 mg/dL   Creatinine, Ser 1.16 0.61 - 1.24 mg/dL   Calcium 8.3 (L) 8.9 - 10.3 mg/dL   GFR calc non Af Amer 60 (L) >60 mL/min   GFR calc Af Amer >60 >60 mL/min    Comment: (NOTE) The eGFR has been calculated using the CKD EPI equation. This calculation has not been validated in all clinical situations. eGFR's persistently <60 mL/min signify possible Chronic Kidney Disease.    Anion gap 7 5 - 15  Protime-INR     Status: Abnormal   Collection Time: 10/10/16  8:15 AM  Result Value Ref Range   Prothrombin Time 15.6 (H) 11.4 - 15.2 seconds   INR 1.23   Glucose, capillary     Status: Abnormal   Collection Time: 10/10/16 11:40  AM  Result Value Ref Range   Glucose-Capillary 333 (H) 65 - 99 mg/dL   Comment 1 Notify RN   Glucose, capillary     Status: Abnormal   Collection Time: 10/10/16  4:37 PM  Result Value Ref Range   Glucose-Capillary 343 (H) 65 - 99 mg/dL  Glucose, capillary     Status: Abnormal   Collection Time: 10/10/16  8:49 PM  Result Value Ref Range   Glucose-Capillary 294 (H) 65 - 99 mg/dL  CBC with Differential/Platelet     Status: Abnormal (Preliminary result)   Collection Time: 10/11/16  7:50 AM  Result Value Ref Range   WBC 18.5 (H) 4.0 - 10.5 K/uL   RBC 2.56 (L) 4.22 - 5.81 MIL/uL   Hemoglobin 6.2 (LL) 13.0 - 17.0 g/dL    Comment: REPEATED TO VERIFY CRITICAL RESULT CALLED TO, READ BACK BY AND VERIFIED WITH: P LOVE,PA [phone] WILDERK    HCT 21.2 (L) 39.0 - 52.0 %   MCV 82.8 78.0 - 100.0 fL   MCH 24.2 (L) 26.0 - 34.0 pg   MCHC 29.2 (L) 30.0 - 36.0 g/dL   RDW 22.4 (H) 11.5 - 15.5 %   Platelets 49 (L) 150 - 400 K/uL    Comment: SPECIMEN  CHECKED FOR CLOTS REPEATED TO VERIFY CONSISTENT WITH PREVIOUS RESULT    Neutrophils Relative % PENDING %   Neutro Abs PENDING 1.7 - 7.7 K/uL   Band Neutrophils PENDING %   Lymphocytes Relative PENDING %   Lymphs Abs PENDING 0.7 - 4.0 K/uL   Monocytes Relative PENDING %   Monocytes Absolute PENDING 0.1 - 1.0 K/uL   Eosinophils Relative PENDING %   Eosinophils Absolute PENDING 0.0 - 0.7 K/uL   Basophils Relative PENDING %   Basophils Absolute PENDING 0.0 - 0.1 K/uL   WBC Morphology PENDING    RBC Morphology PENDING    Smear Review PENDING    nRBC PENDING 0 /100 WBC   Metamyelocytes Relative PENDING %   Myelocytes PENDING %   Promyelocytes Absolute PENDING %   Blasts PENDING %  Glucose, capillary     Status: Abnormal   Collection Time: 10/11/16  8:24 AM  Result Value Ref Range   Glucose-Capillary 193 (H) 65 - 99 mg/dL      General: NAD. Vital signs reviewed.  Psych: Unable to assess due to aphasia Heart: RRR. No JVD. Lungs: Unlabored. Clear. Abdomen: Positive bowel sounds, soft  Skin: Erythema on dorsum of left foot. No rash on back noted.  Neurologic: Alert Appears to have more exp than rec aphasia Inconsistently following commands, moving b/l UE and LLE. Nonverbal, global aphasia Unable to assess MMT and sensation due to aphasia Musculoskeletal: No edema. No tenderness.  Assessment/Plan: 1. 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 Physiatrist is providing close team supervision and 24 hour management of active medical problems listed below. Physiatrist and rehab team continue to assess barriers to discharge/monitor patient progress toward functional and medical goals. FIM: Function - Bathing Position: Bed Body parts bathed by patient: Right arm, Chest, Abdomen, Front perineal area, Right upper leg, Left upper leg Body  parts bathed by helper: Buttocks, Right lower leg, Left lower leg, Back Bathing not applicable: Right lower leg, Left lower leg Assist Level:  (Moderate assist)  Function- Upper Body Dressing/Undressing What is the patient wearing?: Pull over shirt/dress Pull over  shirt/dress - Perfomed by patient: Thread/unthread right sleeve, Thread/unthread left sleeve Pull over shirt/dress - Perfomed by helper: Put head through opening, Pull shirt over trunk Assist Level:  (moderate ) Function - Lower Body Dressing/Undressing What is the patient wearing?: Pants, Non-skid slipper socks Position: Bed Pants- Performed by patient: Pull pants up/down Pants- Performed by helper: Thread/unthread right pants leg, Thread/unthread left pants leg, Fasten/unfasten pants Non-skid slipper socks- Performed by helper: Don/doff right sock, Don/doff left sock Assist for footwear: Dependant Assist for lower body dressing: 2 Helpers  Function - Toileting Toileting activity did not occur: No continent bowel/bladder event Toileting steps completed by helper: Adjust clothing prior to toileting, Performs perineal hygiene, Adjust clothing after toileting Assist level: Two helpers  Function - Air cabin crew transfer assistive device: Grab bar Assist level to toilet: 2 helpers Assist level from toilet: 2 helpers  Function - Chair/bed transfer Chair/bed transfer activity did not occur: Safety/medical concerns Chair/bed transfer method: Squat pivot Chair/bed transfer assist level: 2 helpers Chair/bed transfer assistive device: Armrests Chair/bed transfer details: Manual facilitation for weight shifting, Tactile cues for posture, Verbal cues for sequencing, Verbal cues for technique  Function - Locomotion: Wheelchair Wheelchair activity did not occur: Safety/medical concerns Wheel 50 feet with 2 turns activity did not occur: Safety/medical concerns Wheel 150 feet activity did not occur: Safety/medical  concerns Function - Locomotion: Ambulation Ambulation activity did not occur: Safety/medical concerns Walk 10 feet activity did not occur: Safety/medical concerns Walk 50 feet with 2 turns activity did not occur: Safety/medical concerns Walk 150 feet activity did not occur: Safety/medical concerns Walk 10 feet on uneven surfaces activity did not occur: Safety/medical concerns  Function - Comprehension Comprehension: Auditory Comprehension assist level: Understands basic 25 - 49% of the time/ requires cueing 50 - 75% of the time  Function - Expression Expression: Verbal Expression assist level: Expresses basis less than 25% of the time/requires cueing >75% of the time.  Function - Social Interaction Social Interaction assist level: Interacts appropriately less than 25% of the time. May be withdrawn or combative.  Function - Problem Solving Problem solving assist level: Solves basic less than 25% of the time - needs direction nearly all the time or does not effectively solve problems and may need a restraint for safety  Function - Memory Memory assist level: Recognizes or recalls less than 25% of the time/requires cueing greater than 75% of the time Patient normally able to recall (first 3 days only): That he or she is in a hospital   Medical Problem List and Plan: 1.  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  DVT Prophylaxis/Anticoagulation: Subcutaneous Lovenox, holding.  Monitor platelet counts and any signs of bleeding  SCDs ordered 3. Pain Management: Tylenol as needed 4. Dysphagia. Dysphagia #1 nectar liquids. Monitor for any signs of aspiration.   Added daily at bedtime IV fluids 5. Neuropsych: This patient is capable of making decisions on his own behalf. 6. Skin/Wound Care: Routine skin checks 7. Fluids/Electrolytes/Nutrition: Routine I&Os 8. Aspiration pneumonia. Augmentin 8 doses initiated 10/02/2016. 9. Diabetes mellitus  with peripheral neuropathy. Hemoglobin A1c 7.2.   Check blood sugars before meals and at bedtime  Lantus insulin 12 units daily, increased to 16U on 12/26.  Novolog 4U with meals added 12/25, increased to 8U on 12/26, increased to 12U on 12/27  Continues to be labile, with elevations ~340 10. Hypertension/tachycardia. Lopressor 12.5 mg twice a day. Monitor heart rate with increased mobility  Overall controlled  12/27 11. Stage III chronic kidney disease. Baseline creatinine 1.62. Follow-up chemistries  BMP Latest Ref Rng & Units 10/10/2016 10/06/2016 10/05/2016  Glucose 65 - 99 mg/dL 202(H) 197(H) -  BUN 6 - 20 mg/dL 22(H) 26(H) -  Creatinine 0.61 - 1.24 mg/dL 1.16 1.31(H) 1.45(H)  Sodium 135 - 145 mmol/L 136 143 -  Potassium 3.5 - 5.1 mmol/L 3.8 3.7 -  Chloride 101 - 111 mmol/L 105 108 -  CO2 22 - 32 mmol/L 24 25 -  Calcium 8.9 - 10.3 mg/dL 8.3(L) 9.4 -   12. Mood. Depakote 250 mg BID added for bouts of agitation and restlessness. 13. Hypothyroidism. Synthroid 14. Hyperlipidemia. Lipitor 15. Anemia, acute blood loss.   Severe drop in Hb, Hb 6.2 on 12/27   Hemoccult + 12/25  Repeat labs ordered  Cont to monitor  Will consult GI 16.  Leukocytosis: likely from from UTI  Febrile 12/23, afebrile since  CXR reviewed 12/22 with stable infiltrates, cont to monitor  WBCs 18.5 on 12/27 on abx  Foley replaced  UA+, Ucx Citrobacter   Repeat CXR ordered 17. Thrombocytopenia:   Cont to drop  Plts 49 on 12/27, Lovenox held, < 60,000/mm3 no resistive exercise  SRA remains pending  Will cont to monitor 18. Hypoalbuminemia  Supplement started 12/23 19. Acute lower UTI  Foley to stay in place, difficult placement with tight foreskin.  Will consider Urology consult  Ucx Citrobacter  Cipro started 12/24 for UTI +/- PNA 20. Left foot erythema  Possible related to trauma  Pt is already on abx. 21. AKI on CKD  Cr. 1.13 on 12/27  Cont to monitor  Improving  LOS (Days) 6 A FACE TO FACE  EVALUATION WAS PERFORMED  Dafina Suk Lorie Phenix 10/11/2016, 8:56 AM

## 2016-10-11 NOTE — Progress Notes (Signed)
Pharmacy Antibiotic Note  Douglas [name] is a 75 y.o. male admitted on 10/05/2016 with pneumonia.  Pharmacy has been consulted for vancomycin and zosyn dosing.  SCr 1.5 (CrCl ~58)  Plan: - Vancomycin 750 mg iv q12h and zosyn 3.375 g iv q8h (4hr infusion) - monitor renal function  - check vancomycin trough when it's appropriate     Temp (24hrs), Avg:97.6 F (36.4 C), Min:97.5 F (36.4 C), Max:97.7 F (36.5 C)   Recent Labs Lab 10/05/16 1859 10/06/16 0554 10/08/16 0542 10/10/16 0815 10/11/16 0750 10/11/16 1121  WBC 12.1* 15.0* 13.6*  --  18.5* 21.6*  CREATININE 1.45* 1.31*  --  1.16 1.13  --     CrCl cannot be calculated (Unknown ideal weight.).    No Known Allergies  Antimicrobials this admission: Ciprofloxacin 12/24 >> 12/27 Vancomycin  12/27 >>  Zosyn 12/27 >>  Dose adjustments this admission: None  Microbiology results: 12/23 UCx: citrobacter sensitive to zosyn    Thank you for allowing pharmacy to be a part of this patient's care.  Maleigha Colvard, Tsz-Yin 10/11/2016 3:16 PM

## 2016-10-11 NOTE — Progress Notes (Signed)
Patient refused IV start, RN at bedside.

## 2016-10-11 NOTE — Patient Care Conference (Signed)
Inpatient RehabilitationTeam Conference and Plan of Care Update Date: 10/11/2016   Time: 10:00 AM    Patient Name: Douglas [name]      Medical Record Number: [id number]  Date of Birth: 04/25/1941 Sex: Male         Room/Bed: 4W08C/4W08C-01 Payor Info: Payor: MEDICARE / Plan: MEDICARE PART A AND B / Product Type: *No Product type* /    Admitting Diagnosis: CVA  Admit Date/Time:  10/05/2016  6:35 PM Admission Comments: No comment available   Primary Diagnosis:  <principal problem not specified> Principal Problem: <principal problem not specified>  Patient Active Problem List   Diagnosis Date Noted  . Erythema   . Acute lower UTI   . Dysphagia due to recent cerebral infarction   . Hypoalbuminemia due to protein-calorie malnutrition (HCC)   . Thrombocytopenia (HCC)   . Leukocytosis   . Acute blood loss anemia   . Type 2 diabetes mellitus with peripheral neuropathy (HCC)   . Labile blood glucose   . 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 Right hemiparesis (HCC)   . Urinary retention   . Acute delirium   . CKD (chronic kidney disease) 09/27/2016  . Elevated troponin 09/27/2016  . Anemia 09/27/2016  . Acute embolic stroke (HCC)   . Acute systolic heart failure (HCC)   . Benign essential HTN   . Tachycardia   . CVA (cerebral vascular accident) (HCC) 09/26/2016  . Hyperlipidemia 11/30/2013  . Essential hypertension 11/30/2013  . DM (diabetes mellitus) (HCC) 11/30/2013    Expected Discharge Date: Expected Discharge Date: 11/03/16  Team Members Present: Physician leading conference: Dr. Maryla MorrowAnkit Patel Social Worker Present: Dossie DerBecky Asalee Barrette, LCSW Nurse Present: Chana Bodeeborah Sharp, RN PT Present: Teodoro Kilaitlin Penven-Crew, PT OT Present: Roney MansJennifer Smith, OT SLP Present: Jackalyn LombardNicole Page, SLP PPS Coordinator present : Tora DuckMarie Noel, RN, CRRN     Current Status/Progress Goal Weekly Team Focus  Medical   Right-sided weakness, aphasia, dysphagia secondary to left MCA infarct  Decrease  burden of care, improve mobility, cognition, severe ABLA, thrombocytopenia, leukocytosis  See above   Bowel/Bladder   incont of bowel, managed bladder with foley  manage bowel with min assist and total assist of bladder wtih foley  moniitor need for /use laxatives for bowel movement, using briefs for management   Swallow/Nutrition/ Hydration   Dys 1, nectar thick liquids; full supervision   min assist   MBS, toleration of current diet, trials of advanced consistencies to continue working towards diet progression   ADL's     total assist of 2   mod/max assist   awareness, transfers, standing, ADL's  Mobility   total assist>+2 assist  overall mod assist  cognition, awareness, attention, NMR, postural control, transfers, standing   Communication   globally aphasic, max to total assist for functional communication, also limited by lethargy   min-mod assist   automatic sequences, yes/no response accuracy, multimodal communication    Safety/Cognition/ Behavioral Observations  limited due to lethargy, max to total assist for basic tasks  min assist   attention, arousal   Pain   n/a         Skin   bruising scattered over body, dry flaky pale skin  bruising improving, no skin integrity breakdown  turn q 2 and monitor skin each shift      *See Care Plan and progress notes for long and short-term goals.  Barriers to Discharge: ABLA, thrombocytopenia, leukocytosis, UTI, global aphasia, DM    Possible Resolutions to Barriers:  GI consult, follow labs, may need Heme/Onc consult, therapies, decrease burden of care, optimize DM meds    Discharge Planning/Teaching Needs:  Pt does not have 24 hr care at home, will need to go to a NH when medically ready for DC from CIR. Niece and nephew are supportive along with roommate      Team Discussion:  Goals max-total of one person, currently plus two total assist. Pushes to the right. Globally aphasic. MBS tomorrow to see if can advance diet. GI  consulted due to low hemoglobin-blood in stools. Limited by fatigue-needs multiple rest breaks. Will be NHP when medically ready to transfer from here, no caregivers.  Revisions to Treatment Plan:  None   Continued Need for Acute Rehabilitation Level of Care: The patient requires daily medical management by a physician with specialized training in physical medicine and rehabilitation for the following conditions: Daily direction of a multidisciplinary physical rehabilitation program to ensure safe treatment while eliciting the highest outcome that is of practical value to the patient.: Yes Daily medical management of patient stability for increased activity during participation in an intensive rehabilitation regime.: Yes Daily analysis of laboratory values and/or radiology reports with any subsequent need for medication adjustment of medical intervention for : Neurological problems;Diabetes problems;Other;Nutritional problems  [name], [name] 10/11/2016, 2:33 PM

## 2016-10-11 NOTE — Consult Note (Addendum)
Referring Provider:  Dr. Allena Katz  Primary Care Physician:  Hollice Espy, MD Primary Gastroenterologist:  Dr. Bosie Clos   Reason for Consultation:  Anemia/occult blood positive stool  HPI: Douglas [name] is a 75 y.o. male was admitted to the hospital on 09/26/2016 with right-sided weakness and aphagia. CT scan on initial evaluation showed MCA stroke. Patient was placed and Plavix. Currently getting rehabilitation .  TEE was aborted because of the respiratory distress on 09/29/2016. Patient continues to have drop in hemoglobin. Hemoglobin down to 6.2. Occult blood was ordered which came back positive. GI is consulted for further evaluation.  Patient is unable to give any history. History obtained from Dr. Allena Katz as well as from nursing staff. No evidence of overt bleeding. According to nursing staff, patient is not complaining of any GI symptoms. Denied any black stool, bright blood per rectum, vomiting of blood. Prior GI workup  as mentioned below   PAST GI :  EGD 12/2015 - short segment Barrett's. No dysplasia Colon 12/2015 - 2 mm hyperplastic polyp in the rectum.  2011: colonoscopy that showed diverticulosis, internal hemorrhoids and a hyperplastic polyp was removed; EGD: short segment Barrett's esophagus; Capsule endoscopy: mild duodenitis and nonbleeding small bowel erosion seen. Doing well. History of a tubulovillous adenoma removed in 2004.  Past Medical History:  Diagnosis Date  . CKD (chronic kidney disease)   . DM (diabetes mellitus) (HCC)   . Hyperlipidemia   . Hypertension     No past surgical history on file.  Prior to Admission medications   Medication Sig Start Date End Date Taking? Authorizing Provider  acetaminophen (TYLENOL) 325 MG tablet Take 2 tablets (650 mg total) by mouth every 4 (four) hours as needed for mild pain (or temp > 37.5 C (99.5 F)). 10/05/16  Yes Starleen Arms, MD  amoxicillin-clavulanate (AUGMENTIN) 875-125 MG tablet Take 1 tablet by mouth every  12 (twelve) hours. Continue through 12/22, and then stop 10/05/16  Yes Starleen Arms, MD  aspirin 325 MG tablet Take 1 tablet (325 mg total) by mouth daily. 10/06/16  Yes Leana Roe Elgergawy, MD  atorvastatin (LIPITOR) 80 MG tablet Take 80 mg by mouth daily.   Yes Historical Provider, MD  cholecalciferol (VITAMIN D) 1000 UNITS tablet Take 1,000 Units by mouth daily.   Yes Historical Provider, MD  clopidogrel (PLAVIX) 75 MG tablet Take 1 tablet (75 mg total) by mouth daily. 10/06/16  Yes Starleen Arms, MD  ferrous sulfate 325 (65 FE) MG tablet Take 325 mg by mouth daily with breakfast.   Yes Historical Provider, MD  insulin aspart (NOVOLOG) 100 UNIT/ML injection Inject 0-5 Units into the skin at bedtime. Patient taking differently: Inject 0-15 Units into the skin See admin instructions. Takes 0-15 units 3 times daily Takes 0-5 units at bedtime Sliding scale 10/05/16  Yes Leana Roe Elgergawy, MD  insulin glargine (LANTUS) 100 UNIT/ML injection Inject 0.12 mLs (12 Units total) into the skin daily. 10/06/16  Yes Starleen Arms, MD  levothyroxine (SYNTHROID, LEVOTHROID) 75 MCG tablet Take 75 mcg by mouth daily before breakfast.    Yes Historical Provider, MD  metoprolol tartrate (LOPRESSOR) 25 MG tablet Take 0.5 tablets (12.5 mg total) by mouth 2 (two) times daily. 10/05/16  Yes Leana Roe Elgergawy, MD  senna-docusate (SENOKOT-S) 8.6-50 MG tablet Take 1 tablet by mouth at bedtime as needed for mild constipation. 10/05/16  Yes Starleen Arms, MD  Valproate Sodium (DEPAKENE) 250 MG/5ML SOLN solution Take 5 mLs (250 mg total)  by mouth 2 (two) times daily. 10/05/16  Yes Starleen Armsawood S Elgergawy, MD    Scheduled Meds: . aspirin  325 mg Oral Daily  . atorvastatin  80 mg Oral q1800  . ciprofloxacin  500 mg Oral BID  . clopidogrel  75 mg Oral Daily  . feeding supplement (PRO-STAT SUGAR FREE 64)  30 mL Oral BID  . insulin aspart  0-15 Units Subcutaneous TID WC  . insulin aspart  12 Units  Subcutaneous TID WC  . insulin glargine  16 Units Subcutaneous Daily  . levothyroxine  75 mcg Oral QAC breakfast  . mouth rinse  15 mL Mouth Rinse BID  . metoprolol tartrate  12.5 mg Oral BID  . pantoprazole (PROTONIX) IV  40 mg Intravenous Q12H  . Valproate Sodium  250 mg Oral BID   Continuous Infusions: . sodium chloride 75 mL/hr at 10/10/16 1933   PRN Meds:.acetaminophen, ondansetron **OR** ondansetron (ZOFRAN) IV, RESOURCE THICKENUP CLEAR, sorbitol  Allergies as of 10/05/2016  . (No Known Allergies)    Family History  Problem Relation Age of Onset  . Heart disease Mother   . Cancer Father   . Stroke Brother [id number]    Social History   Social History  . Marital status: Single    Spouse name: N/A  . Number of children: N/A  . Years of education: N/A   Occupational History  . retired    Social History Main Topics  . Smoking status: Former Smoker    Quit date: 06/30/1982  . Smokeless tobacco: Never Used     Comment: maybe not?  Marland Kitchen. Alcohol use Yes     Comment: occasional  . Drug use: No  . Sexual activity: Not on file   Other Topics Concern  . Not on file   Social History Narrative  . No narrative on file    Review of Systems: Not able to obtain  Physical Exam: Vital signs: Vitals:   10/10/16 2124 10/11/16 0449  BP: (!) 149/49 (!) 138/48  Pulse: (!) 107 (!) 101  Resp: 16 16  Temp: 97.7 F (36.5 C) 97.5 F (36.4 C)   Last BM Date: 10/10/16 General:   Alert,  Sitting in a wheelchair with right-sided droop. HEENT. Normocephalic atraumatic. Oral mucosa. Not examined Lungs:  Clear throughout to auscultation.   No wheezes, crackles, or rhonchi. No acute distress. Heart:  Regular rate and rhythm; no murmurs, clicks, rubs,  or gallops. Abdomen: Soft, nontender, nondistended, bowel sounds present. No masses felt. No hepatosplenomegaly palpable. EXT . No edema Rectal:  Deferred  GI:  Lab Results:  Recent Labs  10/11/16 0750  WBC 18.5*  HGB 6.2*  HCT  21.2*  PLT 49*   BMET  Recent Labs  10/10/16 0815 10/11/16 0750  NA 136 137  K 3.8 4.0  CL 105 106  CO2 24 24  GLUCOSE 202* 192*  BUN 22* 23*  CREATININE 1.16 1.13  CALCIUM 8.3* 8.3*   LFT No results for input(s): PROT, ALBUMIN, AST, ALT, ALKPHOS, BILITOT, BILIDIR, IBILI in the last 72 hours. PT/INR  Recent Labs  10/10/16 0815  LABPROT 15.6*  INR 1.23     Studies/Results: No results found.  Impression/Plan: - Occult blood positive stool without any overt bleeding - Slowly drop in hemoglobin.HgB down to 6.2 now  - MCA stroke with aphagia - on Plavix - Leukocytosis - Thrombocytopenia  - Respiratory distress while undergoing TEE   Recommendations ------------------------- - Patient with no overt bleeding. EGD and colonoscopy in  March 2017 which showed short segment Barrett's and 2 mm hyperplastic polyp in the rectum. Also had EGD/colonoscopy and capsule endoscopy in 2011 for iron deficiency anemia. - Recommend CT abdomen pelvis to rule out retroperitoneal bleed in setting of drop in hemoglobin without overt bleeding/spleenomegaly  - We will need to discuss with the family regarding risk and benefits of endoscopy. Plavix also needs to be on hold for 5 days prior to procedures.No Plan for endoscopic intervention at this time.  - Conservative management for now. Monitor H&H. - Transfuse to keep hemoglobin around 7-8 - GI will follow   LOS: 6 days   Kathi DerParag Bensyn Bornemann  MD, FACP 10/11/2016, 9:43 AM  Pager [phone] If no answer or after 5 PM call [phone]

## 2016-10-11 NOTE — Progress Notes (Signed)
Physical Therapy Session Note  Patient Details  Name: Douglas [name] MRN: [id number] Date of Birth: 02/26/1941  Today's Date: 10/11/2016 PT Individual Time: 1002-1100 PT Individual Time Calculation (min): 58 min    Skilled Therapeutic Interventions/Progress Updates:    Therapy session initiated in pt room today.  Treatment focus on addressed impairments in functional mobility, poor R sided attention, impaired motor planning.  Pt performed supine to sit with 2 helpers due to difficulty with following commands.  Additionally, performed squat pivot transfer to wheelchair with 2 helpers due to difficulty in ability to follow commands.  Attempted wheelchair propulsion, however, pt requires constant cueing for participation and utilization of L upper extremity.  Pt sat upright in wheelchair with nursing present in room while eating breaksfast.  Therapist assisted with feeding to emphasize use of B upper extremities with task, however, pt shows poor motor planning with use of spoon to scoop food.  Pt was able to use L hand with supervision to hold toothbrush and brush teeth.  Pt was left with mitts on both hands and lap belt secured.  Nursing notified and call bell in place.   Therapy Documentation Precautions:  Precautions Precautions: Fall Precaution Comments: R side inattention, strong push to R side, posterior lean Restrictions Weight Bearing Restrictions: No   See Function Navigator for Current Functional Status.   Therapy/Group: Individual Therapy  Gildo Crisco Elveria RisingM Tayley Mudrick 10/11/2016, 12:06 PM

## 2016-10-11 NOTE — Progress Notes (Signed)
Discussed with Dr. Drusilla KannerPatel--will transfuse with 2 units PRBC. Heme/Onc consulted for input on thrombocytopenia.  Will broaden antibiotic coverage to Vanc/Zosyn with RML infiltrate and concerns of aspiration PNA-- discontinue Cipro.

## 2016-10-12 ENCOUNTER — Inpatient Hospital Stay (HOSPITAL_COMMUNITY): Payer: Medicare Other

## 2016-10-12 ENCOUNTER — Inpatient Hospital Stay (HOSPITAL_COMMUNITY): Payer: Medicare Other | Admitting: Speech Pathology

## 2016-10-12 ENCOUNTER — Inpatient Hospital Stay (HOSPITAL_COMMUNITY): Payer: Medicare Other | Admitting: Physical Therapy

## 2016-10-12 ENCOUNTER — Inpatient Hospital Stay (HOSPITAL_COMMUNITY): Payer: Medicare Other | Admitting: Occupational Therapy

## 2016-10-12 DIAGNOSIS — N179 Acute kidney failure, unspecified: Secondary | ICD-10-CM

## 2016-10-12 DIAGNOSIS — A419 Sepsis, unspecified organism: Secondary | ICD-10-CM

## 2016-10-12 DIAGNOSIS — J189 Pneumonia, unspecified organism: Secondary | ICD-10-CM

## 2016-10-12 LAB — GLUCOSE, CAPILLARY
GLUCOSE-CAPILLARY: 204 mg/dL — AB (ref 65–99)
GLUCOSE-CAPILLARY: 219 mg/dL — AB (ref 65–99)
GLUCOSE-CAPILLARY: 234 mg/dL — AB (ref 65–99)
GLUCOSE-CAPILLARY: 115 mg/dL — AB (ref 65–99)
Glucose-Capillary: 257 mg/dL — ABNORMAL HIGH (ref 65–99)

## 2016-10-12 LAB — CBC
HCT: 21.3 % — ABNORMAL LOW (ref 39.0–52.0)
HCT: 19.6 % — ABNORMAL LOW (ref 39.0–52.0)
HEMATOCRIT: 29.2 % — AB (ref 39.0–52.0)
HEMOGLOBIN: 5.9 g/dL — AB (ref 13.0–17.0)
HEMOGLOBIN: 9.2 g/dL — AB (ref 13.0–17.0)
Hemoglobin: 6.1 g/dL — CL (ref 13.0–17.0)
MCH: 23.9 pg — AB (ref 26.0–34.0)
MCH: 24.5 pg — ABNORMAL LOW (ref 26.0–34.0)
MCH: 25.4 pg — ABNORMAL LOW (ref 26.0–34.0)
MCHC: 28.6 g/dL — AB (ref 30.0–36.0)
MCHC: 30.1 g/dL (ref 30.0–36.0)
MCHC: 31.5 g/dL (ref 30.0–36.0)
MCV: 83.5 fL (ref 78.0–100.0)
MCV: 81.3 fL (ref 78.0–100.0)
MCV: 80.7 fL (ref 78.0–100.0)
PLATELETS: 47 10*3/uL — AB (ref 150–400)
PLATELETS: 49 10*3/uL — AB (ref 150–400)
Platelets: 54 10*3/uL — ABNORMAL LOW (ref 150–400)
RBC: 2.55 MIL/uL — ABNORMAL LOW (ref 4.22–5.81)
RBC: 2.41 MIL/uL — AB (ref 4.22–5.81)
RBC: 3.62 MIL/uL — ABNORMAL LOW (ref 4.22–5.81)
RDW: 22.9 % — AB (ref 11.5–15.5)
RDW: 22.6 % — ABNORMAL HIGH (ref 11.5–15.5)
RDW: 19.7 % — ABNORMAL HIGH (ref 11.5–15.5)
WBC: 16.7 10*3/uL — ABNORMAL HIGH (ref 4.0–10.5)
WBC: 16.9 10*3/uL — AB (ref 4.0–10.5)
WBC: 15.4 10*3/uL — AB (ref 4.0–10.5)

## 2016-10-12 LAB — DIRECT ANTIGLOBULIN TEST (NOT AT ARMC)
DAT, IGG: NEGATIVE
DAT, complement: NEGATIVE

## 2016-10-12 LAB — CREATININE, SERUM
CREATININE: 1.44 mg/dL — AB (ref 0.61–1.24)
GFR calc Af Amer: 53 mL/min — ABNORMAL LOW (ref >60)
GFR calc non Af Amer: 46 mL/min — ABNORMAL LOW (ref >60)

## 2016-10-12 LAB — HAPTOGLOBIN: HAPTOGLOBIN: 203 mg/dL — AB (ref 34–200)

## 2016-10-12 LAB — PREPARE RBC (CROSSMATCH)

## 2016-10-12 MED ORDER — FUROSEMIDE 10 MG/ML IJ SOLN
INTRAMUSCULAR | Status: AC
  Filled 2016-10-12: qty 2

## 2016-10-12 MED ORDER — FUROSEMIDE 10 MG/ML IJ SOLN
20.0000 mg | Freq: Once | INTRAMUSCULAR | Status: AC
  Administered 2016-10-12: 20 mg via INTRAVENOUS

## 2016-10-12 MED ORDER — DIPHENHYDRAMINE HCL 25 MG PO CAPS
25.0000 mg | ORAL_CAPSULE | Freq: Once | ORAL | Status: AC
  Administered 2016-10-12: 25 mg via ORAL
  Filled 2016-10-12: qty 1

## 2016-10-12 MED ORDER — SODIUM CHLORIDE 0.9 % IV SOLN
Freq: Once | INTRAVENOUS | Status: AC
  Administered 2016-10-12: 09:00:00 via INTRAVENOUS

## 2016-10-12 MED ORDER — ACETAMINOPHEN 325 MG PO TABS
650.0000 mg | ORAL_TABLET | Freq: Once | ORAL | Status: AC
  Administered 2016-10-12: 650 mg via ORAL
  Filled 2016-10-12: qty 2

## 2016-10-12 MED ORDER — FOLIC ACID 1 MG PO TABS
1.0000 mg | ORAL_TABLET | Freq: Every day | ORAL | Status: DC
  Administered 2016-10-12 – 2016-10-31 (×20): 1 mg via ORAL
  Filled 2016-10-12 (×21): qty 1

## 2016-10-12 NOTE — Progress Notes (Signed)
Speech Language Pathology Daily Session Note  Patient Details  Name: Douglas [name] MRN: [id number] Date of Birth: 07/13/1941  Today's Date: 10/12/2016 SLP Individual Time: 1555-1610 SLP Individual Time Calculation (min): 15 min   Short Term Goals: Week 1: SLP Short Term Goal 1 (Week 1): Pt will answer yes/no questions for 75% accuracy with max assist multimodal cues.   SLP Short Term Goal 2 (Week 1): Pt will follow 1 step commands for 75% accuracy with max assist multimodal cues.   SLP Short Term Goal 3 (Week 1): Pt will consume dys 1 textures and nectar thick liquids with mod assist for use of swallowing precautions and minimal overt s/s of aspiration.   SLP Short Term Goal 4 (Week 1): Pt will verbalize at the word level during structured tasks for 50% accuracy with max assist multimodal cues. SLP Short Term Goal 5 (Week 1): Pt will utilize multimodal means of communication to convey needs and wants to caregivers with max assist cues.    Skilled Therapeutic Interventions: Skilled treatment session focused on dysphagia goals. SLP facilitated session by providing Max A multimodal support for arousal in preparation for safe PO intake. SLP repositioned pt, present cold stimulation and Max a verbal cues. Pt unable to arouse. Nursing advises that pt is receiving blood products and has received benedryl. Pt unable to arouse for safe intake during session. Pt left in bed with bed alarm on and all needs within reach. Continue current plan of care.   Function:  Eating Eating Eating activity did not occur: Safety/medical concerns (Unable to arouse) Modified Consistency Diet: Yes Eating Assist Level: Helper checks for pocketed food;Helper scoops food on utensil;Helper brings food to mouth;Help with picking up utensils;Help managing cup/glass;Hand over hand assist;Set up assist for   Eating Set Up Assist For: Opening containers Helper Scoops Food on Utensil: Occasionally Helper Brings Food to  Mouth: Occasionally   Cognition Comprehension Comprehension assist level: Understands basic 25 - 49% of the time/ requires cueing 50 - 75% of the time  Expression   Expression assist level: Expresses basic 25 - 49% of the time/requires cueing 50 - 75% of the time. Uses single words/gestures.  Social Interaction Social Interaction assist level: Interacts appropriately 25 - 49% of time - Needs frequent redirection.  Problem Solving Problem solving assist level: Solves basic 25 - 49% of the time - needs direction more than half the time to initiate, plan or complete simple activities  Memory Memory assist level: Recognizes or recalls less than 25% of the time/requires cueing greater than 75% of the time    Pain Pain Assessment Pain Assessment: No/denies pain  Therapy/Group: Individual Therapy  Shaira Sova 10/12/2016, 4:14 PM

## 2016-10-12 NOTE — Plan of Care (Signed)
Problem: RH KNOWLEDGE DEFICIT Goal: RH STG INCREASE KNOWLEDGE OF HYPERTENSION Patient 's caregiver will be able to verbalize understanding of managing hypertension with medications  Outcome: Progressing Niece and nephew can verbalize minimal understanding of hypertension

## 2016-10-12 NOTE — Progress Notes (Signed)
[facility] Progress Note  Douglas [name] 75 y.o. 09/09/1941  CC:   Anemia, OB +    Subjective: resting comfortably in the bed. D/W nursing staff. No acute issues. No overt bleeding.   ROS : not able to obtain    Objective: Vital signs in last 24 hours: Vitals:   10/12/16 0555 10/12/16 0839  BP: (!) 130/47 125/63  Pulse: (!) 103 (!) 102  Resp: 18 18  Temp: 97.8 F (36.6 C) 97.4 F (36.3 C)    Physical Exam:  General:   Alert,  Responds with eye movement. Trying to speak some words.  HEENT. Normocephalic atraumatic. Oral mucosa. Not examined Lungs:  Clear throughout to auscultation.   No wheezes, crackles, or rhonchi. No acute distress. Heart:  Regular rate and rhythm; Murmur noted.  Abdomen: Soft, nontender, nondistended, bowel sounds present. No masses felt. No hepatosplenomegaly palpable. EXT . No edema   Lab Results:  Recent Labs  10/10/16 0815 10/11/16 0750 10/12/16 0228  NA 136 137  --   K 3.8 4.0  --   CL 105 106  --   CO2 24 24  --   GLUCOSE 202* 192*  --   BUN 22* 23*  --   CREATININE 1.16 1.13 1.44*  CALCIUM 8.3* 8.3*  --    No results for input(s): AST, ALT, ALKPHOS, BILITOT, PROT, ALBUMIN in the last 72 hours.  Recent Labs  10/11/16 0750  10/11/16 2034 10/12/16 0228  WBC 18.5*  < > 16.6* 16.7*  NEUTROABS 14.0*  --   --   --   HGB 6.2*  < > 5.9* 6.1*  HCT 21.2*  < > 19.7* 21.3*  MCV 82.8  < > 80.7 83.5  PLT 49*  < > 38* 47*  < > = values in this interval not displayed.  Recent Labs  10/10/16 0815 10/11/16 1811  LABPROT 15.6* 16.3*  INR 1.23 1.30      Assessment/Plan: - Occult blood positive stool without any overt bleeding - Slowly drop in hemoglobin.HgB down to 6.1 now  - MCA stroke with aphagia - on Plavix - Leukocytosis - Thrombocytopenia  - Respiratory distress while undergoing TEE   Recommendations ------------------------- - Patient with no overt bleeding. CT abd/pelvis  negative for retroperitoneal  bleed.  - Follow Hem/Onc work up. ? DIC/Hemolysis  - Conservative management for now. Monitor H&H. - Transfuse to keep hemoglobin around 7-8 - We will need to discuss with the family regarding risk and benefits of endoscopy. Plavix also needs to be on hold for 5 days prior to procedures. - GI will follow  - EGD and colonoscopy in March 2017 which showed short segment Barrett's and 2 mm hyperplastic polyp in the rectum. Also had EGD/colonoscopy and capsule endoscopy in 2011 for iron deficiency anemia.     Kathi DerParag Caileb Rhue MD, FACP 10/12/2016, 8:49 AM  Pager [phone]  If no answer or after 5 PM call [phone]

## 2016-10-12 NOTE — Progress Notes (Signed)
Pt VSS for remainder of shift, no s/s of change in LOC or change in level of pain, no s/s of bleeding.  Pt resting peacefully in bed with bed alarm on.

## 2016-10-12 NOTE — Progress Notes (Signed)
Douglas [name]   DOB:09/01/1941   ZO#:[id number]#:[id number]    Subjective: He feels weak. The patient denies any recent signs or symptoms of bleeding such as spontaneous epistaxis, hematuria or hematochezia.  Objective:  Vitals:   10/12/16 0555 10/12/16 0839  BP: (!) 130/47 125/63  Pulse: (!) 103 (!) 102  Resp: 18 18  Temp: 97.8 F (36.6 C) 97.4 F (36.3 C)     Intake/Output Summary (Last 24 hours) at 10/12/16 0949 Last data filed at 10/12/16 0358  Gross per 24 hour  Intake                0 ml  Output              975 ml  Net             -975 ml    GENERAL:alert, no distress and comfortable SKIN: skin color is pale, texture, turgor are normal, no rashes or significant lesions EYES: normal, Conjunctiva are pale and non-injected, sclera clear Musculoskeletal:no cyanosis of digits and no clubbing  NEURO: alert & oriented x 3 with mild dysarthria and focal weakness from recent stroke   Labs:  Lab Results  Component Value Date   WBC 16.9 (H) 10/12/2016   HGB 5.9 (LL) 10/12/2016   HCT 19.6 (L) 10/12/2016   MCV 81.3 10/12/2016   PLT 49 (L) 10/12/2016   NEUTROABS 14.0 (H) 10/11/2016    Lab Results  Component Value Date   NA 137 10/11/2016   K 4.0 10/11/2016   CL 106 10/11/2016   CO2 24 10/11/2016    Studies:  Ct Abdomen Pelvis Wo Contrast  Result Date: 10/11/2016 CLINICAL DATA:  Recent hospitalization for stroke, on Plavix therapy. Progressive anemia with hemoccult positive stool. Question retroperitoneal bleed. EXAM: CT ABDOMEN AND PELVIS WITHOUT CONTRAST TECHNIQUE: Multidetector CT imaging of the abdomen and pelvis was performed following the standard protocol without IV contrast. COMPARISON:  Renal ultrasound 09/27/2016. Abdominal MRI 07/12/2010, chest x-ray 10/11/2016. FINDINGS: Lower chest: Small dependent right pleural effusion. There is prominence of the right hilum with patchy right infrahilar opacity. The heart size is normal. There is coronary artery atherosclerosis.  Bilateral gynecomastia noted. Hepatobiliary: As evaluated in the noncontrast state, the liver appears unremarkable. No evidence of gallstones, gallbladder wall thickening or biliary dilatation. Pancreas: Unremarkable. No pancreatic ductal dilatation or surrounding inflammatory changes. Spleen: Normal in size without focal abnormality. Adrenals/Urinary Tract: Both adrenal glands appear normal. 2 cm low-density lesion in the upper pole the right kidney on image 27 corresponds with a cyst on ultrasound. No evidence of urinary tract calculus, hydronephrosis or perinephric soft tissue stranding. Foley catheter is in place. There is mild bladder wall thickening. Gas in the bladder lumen attributed to the Foley. Stomach/Bowel: Bowel assessment limited by the lack of intravenous and enteric contrast. No evidence of bowel wall thickening, distention or surrounding inflammatory change. Diverticular changes throughout the distal colon, greatest within the sigmoid colon. The appendix appears normal. Vascular/Lymphatic: There are no enlarged abdominal or pelvic lymph nodes. Aortic and branch vessel atherosclerosis. Reproductive: Moderate enlargement of the prostate gland. Other: No evidence of retroperitoneal hematoma. No evidence of ascites or hemoperitoneum. Musculoskeletal: No acute or significant osseous findings. Mild lumbar spondylosis. IMPRESSION: 1. No evidence of retroperitoneal hematoma or other acute findings. 2. No clear explanation for anemia or heme-positive stool on noncontrast imaging. Further bowel evaluation may be warranted. 3. Small right pleural effusion with right infrahilar opacity and prominence of the right hilum, seen on radiographs  today. Chest radiographic follow up recommended to exclude thoracic neoplasm. 4. Additional incidental findings, including a stable right renal cyst, bladder wall thickening, colonic diverticulosis and aortic atherosclerosis. Electronically Signed   By: Carey BullocksWilliam  Veazey M.D.    On: 10/11/2016 16:47   Dg Chest 2 View  Result Date: 10/11/2016 CLINICAL DATA:  Leukocytosis EXAM: CHEST  2 VIEW COMPARISON:  October 06, 2016 FINDINGS: There is patchy infiltrate in the right middle lobe region, new. A small amount of patchy opacity in the anterior segment of the right upper lobe is stable. Left lung is clear. Heart size and pulmonary vascularity are normal. No appreciable adenopathy. No bone lesions. There is calcification in the ascending thoracic aorta. IMPRESSION: Pain patchy infiltrate right middle lobe. Stable patchy infiltrate anterior segment right upper lobe. Left lung clear. Stable cardiac silhouette. Aortic atherosclerosis. Electronically Signed   By: Bretta BangWilliam  Woodruff III M.D.   On: 10/11/2016 14:38    Assessment & Plan:   Progressive anemia Multifactorial, combination of anemia of chronic disease, iron deficiency anemia likely due to microscopic GI bleed and bone marrow suppression from recent infection We discussed some of the risks, benefits, and alternatives of blood transfusions. The patient is symptomatic from anemia and the hemoglobin level is critically low.  Some of the side-effects to be expected including risks of transfusion reactions, chills, infection, syndrome of volume overload and risk of hospitalization from various reasons and the patient is willing to proceed and went ahead to sign consent today. I will proceed with 2 units of blood transfusion today. I have discontinued antiplatelet agents after discussion with primary service He may benefit from IV iron in the future I would defer to GI for consideration for repeat endoscopy workup, discussed with GI service  Progressive thrombocytopenia Multifactorial, likely due to bone marrow suppression, urinary tract infection, pneumonia and mild liver disease as evidenced from recent workup. He does not need platelet transfusion Recommend close monitoring I would not recommend resuming antiplatelet  agents until platelet count is consistently above 50,000 I recommend daily folic acid supplementation  Urinary tract infection and possible aspiration pneumonia Continue antibiotic therapy as directed by primary service  Discharge planning Not ready for discharge I will follow  Artis DelayNi Misao Fackrell, MD 10/12/2016  9:49 AM

## 2016-10-12 NOTE — Progress Notes (Signed)
Physical Therapy Session Note  Patient Details  Name: Douglas [name] MRN: [id number] Date of Birth: 08/07/1941  Today's Date: 10/12/2016 PT Individual Time: 1015-1053 PT Individual Time Calculation (min): 38 min    Short Term Goals: Week 1:  PT Short Term Goal 1 (Week 1): Pt will transition to sitting EOB with +1 assist PT Short Term Goal 2 (Week 1): Pt will transfer to w/c with +1 assist and LRAD PT Short Term Goal 3 (Week 1): Pt will initiate gait training with PT  Skilled Therapeutic Interventions/Progress Updates:    Pt resting in bed on arrival, RN starting blood transfusion so missed first 15 minutes of therapy.  Session focus on bed mobility, functional use of RUE/RLE for dressing from bed level, transfers, and static sitting balance.  Pt requires +2 assist for lower body dressing from bed level, though demonstrates initiation with attempts to use BUE for assist with dressing tasks and bridging with min cues to maintain position to pull pants over hips.  Supine>sit with max assist to elevate trunk and tactile cues to move BLEs off EOB.  Squat/pivot bed<>bedside commode with +2 assist but pt demonstrating improved motor planning compared to previous sessions with sit<>stand by having BLEs in contact with floor upon initiation of stand.  Multiple sit<>stand from [facility] for clothing management/hygiene with max assist to transfer and +2 for toileting.  Pt able to maintain static sitting balance on BSC with initial mod assist fade to steady assist with time.  (+) BM.  Pt's IV noted to be leaking blood while on [facility], RN alerted and request transfer back to bed so IV team can come and assess site.  +2 for transfer back to bed and for sit>supine as pt attempting to lay back across width of bed, rather than with head on pillow.  Positioned to comfort with call bell in reach and needs met.    Therapy Documentation Precautions:  Precautions Precautions: Fall Precaution Comments: R side inattention,  strong push to R side, posterior lean Restrictions Weight Bearing Restrictions: No General: PT Amount of Missed Time (min): 22 Minutes PT Missed Treatment Reason: Nursing care;Unavailable (Comment) (first 15 min starting blood transfusion)   See Function Navigator for Current Functional Status.   Therapy/Group: Individual Therapy  Douglas [name] 10/12/2016, 10:55 AM

## 2016-10-12 NOTE — Plan of Care (Signed)
Problem: RH BOWEL ELIMINATION Goal: RH STG MANAGE BOWEL WITH ASSISTANCE STG Manage Bowel with max Assistance.   Outcome: Not Progressing Requires total assist  Goal: RH STG MANAGE BOWEL W/MEDICATION W/ASSISTANCE STG Manage Bowel with Medication with max Assistance.   Outcome: Not Progressing Requires total assist   Problem: RH SKIN INTEGRITY Goal: RH STG MAINTAIN SKIN INTEGRITY WITH ASSISTANCE STG Maintain Skin Integrity With mod Assistance.   Outcome: Not Progressing Requires total assist patient is unaware of skin integrity   Problem: RH SAFETY Goal: RH STG DECREASED RISK OF FALL WITH ASSISTANCE STG Decreased Risk of Fall With max Assistance.   Outcome: Not Progressing Larey SeatFell 10-02-16. Bed alarm and frequent checks.  Problem: RH KNOWLEDGE DEFICIT Goal: RH STG INCREASE KNOWLEDGE OF DIABETES Outcome: Not Progressing No cognitive awareness at this time

## 2016-10-12 NOTE — Progress Notes (Signed)
Speech Language Pathology Daily Session Note  Patient Details  Name: Douglas [name] MRN: [id number] Date of Birth: 01/05/1941  Today's Date: 10/12/2016 SLP Individual Time: 0910-0950 SLP Individual Time Calculation (min): 40 min   Short Term Goals: Week 1: SLP Short Term Goal 1 (Week 1): Pt will answer yes/no questions for 75% accuracy with max assist multimodal cues.   SLP Short Term Goal 2 (Week 1): Pt will follow 1 step commands for 75% accuracy with max assist multimodal cues.   SLP Short Term Goal 3 (Week 1): Pt will consume dys 1 textures and nectar thick liquids with mod assist for use of swallowing precautions and minimal overt s/s of aspiration.   SLP Short Term Goal 4 (Week 1): Pt will verbalize at the word level during structured tasks for 50% accuracy with max assist multimodal cues. SLP Short Term Goal 5 (Week 1): Pt will utilize multimodal means of communication to convey needs and wants to caregivers with max assist cues.    Skilled Therapeutic Interventions: Pt was seen for skilled ST targeting goals for dysphagia and communication.  Pt now awaiting blood transfusion this AM due to low hemoglobin.  Discussed with RN and PA, both of whom are in agreement with holding MBS until transfusion is complete.  Also discussed results of chest x ray and broadened antibiotics coverage with PA and RN in light of delaying MBS.    Both are comfortable with pt continuing PO intake of current diet as long as pt is alert until MBS can be completed given that pt's lungs sound clear and vitals are otherwise stable.  Pt needed mod-max assist multimodal cues for initiation and sequencing of self feeding due to motor planning impairment.  Pt also required mod assist multimodal cues for rate and portion control due to impulsivity with intake.  Pt with x2 delayed coughs following intake of nectar thick liquids which SLP suspects to be related to pharyngeal residue and what appears to be a delay in swallow  initiation.  Small controlled cup sips were tolerated well without overt s/s of aspiration.  Pt's family was present towards the end of today's therapy session and presented with questions regarding current goals and progress in therapy.  Therapist updated family on plan for MBS to determine safest diet consistencies and also discussed pt's current language impairments and strategies that could be helpful for maximizing pt's independence for functional communication.  Pt was able to answer simple, immediate yes/no questions in ~50% of opportunities with mod assist multimodal cues. Mod-max assist multimodal cues were needed to follow 1 step commands.  All pt's family's questions were answered to their satisfaction at this time.  Pt left in bed with family and nursing present. Continue per current plan of care.     Function:  Eating Eating   Modified Consistency Diet: Yes Eating Assist Level: Helper checks for pocketed food;Helper scoops food on utensil;Helper brings food to mouth;Help with picking up utensils;Help managing cup/glass;Hand over hand assist;Set up assist for   Eating Set Up Assist For: Opening containers Helper Scoops Food on Utensil: Occasionally Helper Brings Food to Mouth: Occasionally   Cognition Comprehension Comprehension assist level: Understands basic 25 - 49% of the time/ requires cueing 50 - 75% of the time  Expression   Expression assist level: Expresses basic 25 - 49% of the time/requires cueing 50 - 75% of the time. Uses single words/gestures.  Social Interaction Social Interaction assist level: Interacts appropriately 25 - 49% of time - Needs  frequent redirection.  Problem Solving Problem solving assist level: Solves basic 25 - 49% of the time - needs direction more than half the time to initiate, plan or complete simple activities  Memory Memory assist level: Recognizes or recalls less than 25% of the time/requires cueing greater than 75% of the time    Pain Pain  Assessment Pain Assessment: No/denies pain  Therapy/Group: Individual Therapy  Douglas [name], Douglas [name] 10/12/2016, 12:25 PM

## 2016-10-12 NOTE — Progress Notes (Signed)
Occupational Therapy Session Note  Patient Details  Name: Douglas [name] MRN: [id number] Date of Birth: 05-Jun-1941  Today's Date: 10/12/2016 OT Individual Time: 1300-1355 OT Individual Time Calculation (min): 55 min     Short Term Goals: Week 1:  OT Short Term Goal 1 (Week 1): Pt will maintain unsupported sitting balance with min assist for 2 mins to engage in self-care task OT Short Term Goal 2 (Week 1): Pt will transfer to toilet with max assist of 1 caregiver OT Short Term Goal 3 (Week 1): Pt will complete sit > stand with max assist of one caregiver to progress towards LB dressing at sit > stand level OT Short Term Goal 4 (Week 1): Pt will complete LB dressing with max assist  OT Short Term Goal 5 (Week 1): Pt will complete 2 grooming tasks with < mod verbal cues due to ideational apraxia  Skilled Therapeutic Interventions/Progress Updates:   Pt greeted in bed with niece and nursing present finishing 1st unit of PRBCs. Pt very lethargic/somnulent w/ difficulty maintaining alertness.  Total A +2 bed mobility sup<> sit and environmental changes to achieve increased alertness. Pt w/ intermittent brief episodes of alertness. Able to initiate washing his face but required hand-over hand assist to bring cloth to face using R hand. Pt unable to maintain alertness consistently enough to safely participate in self-feeding task. Max/Total A to maintain sitting balance at EOB for ~ 10-15 mins. Total A transfer back to bed, total A rolling to doff pants/brief and dependent peri-care performed. Noted bloody discharge from penis, unable to pull foreskin back 2/2 swelling, RN entered room to assess. Pt left semi-reclined in bed with needs met and RN and niece present.   Therapy Documentation Precautions:  Precautions Precautions: Fall Precaution Comments: R side inattention, strong push to R side, posterior lean Restrictions Weight Bearing Restrictions: No Pain: Pain Assessment Pain Assessment:  No/denies pain  See Function Navigator for Current Functional Status.   Therapy/Group: Individual Therapy  Valma Cava 10/12/2016, 3:57 PM

## 2016-10-12 NOTE — Progress Notes (Signed)
Long discussion with niece and family members. Risk and benefits of EGD and colonoscopy discussed in detail. In absence of overt bleeding, utility of endoscopy to find bleeding source is low. Patient remains at increased risk of complications such as aspiration because of oropharyngeal dysphagia as well as aspiration during procedure from sedation.  Family has decided not to pursue endoscopic intervention at this time. If patient develops overt bleeding, they may consider endoscopic evaluation at that time.  GI will sign off. Call us back if needed

## 2016-10-12 NOTE — Progress Notes (Signed)
Subjective/Complaints: Pt sitting up in bed this AM.  He is more alert.  Yesterday he refused transfusion.  Spoke to GI as well yesterday regarding EGD.    Review of systems: Unable to obtain secondary to aphasia  Objective: Vital Signs: Blood pressure (!) 127/49, pulse (!) 112, temperature 97.6 F (36.4 C), temperature source Oral, resp. rate 16, weight 72.3 kg (159 lb 6.3 oz), SpO2 96 %. Ct Abdomen Pelvis Wo Contrast  Result Date: 10/11/2016 CLINICAL DATA:  Recent hospitalization for stroke, on Plavix therapy. Progressive anemia with hemoccult positive stool. Question retroperitoneal bleed. EXAM: CT ABDOMEN AND PELVIS WITHOUT CONTRAST TECHNIQUE: Multidetector CT imaging of the abdomen and pelvis was performed following the standard protocol without IV contrast. COMPARISON:  Renal ultrasound 09/27/2016. Abdominal MRI 07/12/2010, chest x-ray 10/11/2016. FINDINGS: Lower chest: Small dependent right pleural effusion. There is prominence of the right hilum with patchy right infrahilar opacity. The heart size is normal. There is coronary artery atherosclerosis. Bilateral gynecomastia noted. Hepatobiliary: As evaluated in the noncontrast state, the liver appears unremarkable. No evidence of gallstones, gallbladder wall thickening or biliary dilatation. Pancreas: Unremarkable. No pancreatic ductal dilatation or surrounding inflammatory changes. Spleen: Normal in size without focal abnormality. Adrenals/Urinary Tract: Both adrenal glands appear normal. 2 cm low-density lesion in the upper pole the right kidney on image 27 corresponds with a cyst on ultrasound. No evidence of urinary tract calculus, hydronephrosis or perinephric soft tissue stranding. Foley catheter is in place. There is mild bladder wall thickening. Gas in the bladder lumen attributed to the Foley. Stomach/Bowel: Bowel assessment limited by the lack of intravenous and enteric contrast. No evidence of bowel wall thickening, distention or  surrounding inflammatory change. Diverticular changes throughout the distal colon, greatest within the sigmoid colon. The appendix appears normal. Vascular/Lymphatic: There are no enlarged abdominal or pelvic lymph nodes. Aortic and branch vessel atherosclerosis. Reproductive: Moderate enlargement of the prostate gland. Other: No evidence of retroperitoneal hematoma. No evidence of ascites or hemoperitoneum. Musculoskeletal: No acute or significant osseous findings. Mild lumbar spondylosis. IMPRESSION: 1. No evidence of retroperitoneal hematoma or other acute findings. 2. No clear explanation for anemia or heme-positive stool on noncontrast imaging. Further bowel evaluation may be warranted. 3. Small right pleural effusion with right infrahilar opacity and prominence of the right hilum, seen on radiographs today. Chest radiographic follow up recommended to exclude thoracic neoplasm. 4. Additional incidental findings, including a stable right renal cyst, bladder wall thickening, colonic diverticulosis and aortic atherosclerosis. Electronically Signed   By: Richardean Sale M.D.   On: 10/11/2016 16:47   Dg Chest 2 View  Result Date: 10/11/2016 CLINICAL DATA:  Leukocytosis EXAM: CHEST  2 VIEW COMPARISON:  October 06, 2016 FINDINGS: There is patchy infiltrate in the right middle lobe region, new. A small amount of patchy opacity in the anterior segment of the right upper lobe is stable. Left lung is clear. Heart size and pulmonary vascularity are normal. No appreciable adenopathy. No bone lesions. There is calcification in the ascending thoracic aorta. IMPRESSION: Pain patchy infiltrate right middle lobe. Stable patchy infiltrate anterior segment right upper lobe. Left lung clear. Stable cardiac silhouette. Aortic atherosclerosis. Electronically Signed   By: Lowella Grip III M.D.   On: 10/11/2016 14:38   Results for orders placed or performed during the hospital encounter of 10/05/16 (from the past 72  hour(s))  Glucose, capillary     Status: Abnormal   Collection Time: 10/09/16 11:31 AM  Result Value Ref Range   Glucose-Capillary 242 (H)  65 - 99 mg/dL  Occult blood card to lab, stool RN will collect     Status: Abnormal   Collection Time: 10/09/16 12:55 PM  Result Value Ref Range   Fecal Occult Bld POSITIVE (A) NEGATIVE  Glucose, capillary     Status: Abnormal   Collection Time: 10/09/16  4:42 PM  Result Value Ref Range   Glucose-Capillary 298 (H) 65 - 99 mg/dL  Glucose, capillary     Status: Abnormal   Collection Time: 10/09/16  8:52 PM  Result Value Ref Range   Glucose-Capillary 194 (H) 65 - 99 mg/dL  Occult blood card to lab, stool RN will collect     Status: Abnormal   Collection Time: 10/10/16 12:23 AM  Result Value Ref Range   Fecal Occult Bld POSITIVE (A) NEGATIVE  Glucose, capillary     Status: Abnormal   Collection Time: 10/10/16  6:35 AM  Result Value Ref Range   Glucose-Capillary 181 (H) 65 - 99 mg/dL  Basic metabolic panel     Status: Abnormal   Collection Time: 10/10/16  8:15 AM  Result Value Ref Range   Sodium 136 135 - 145 mmol/L   Potassium 3.8 3.5 - 5.1 mmol/L   Chloride 105 101 - 111 mmol/L   CO2 24 22 - 32 mmol/L   Glucose, Bld 202 (H) 65 - 99 mg/dL   BUN 22 (H) 6 - 20 mg/dL   Creatinine, Ser 1.16 0.61 - 1.24 mg/dL   Calcium 8.3 (L) 8.9 - 10.3 mg/dL   GFR calc non Af Amer 60 (L) >60 mL/min   GFR calc Af Amer >60 >60 mL/min    Comment: (NOTE) The eGFR has been calculated using the CKD EPI equation. This calculation has not been validated in all clinical situations. eGFR's persistently <60 mL/min signify possible Chronic Kidney Disease.    Anion gap 7 5 - 15  Protime-INR     Status: Abnormal   Collection Time: 10/10/16  8:15 AM  Result Value Ref Range   Prothrombin Time 15.6 (H) 11.4 - 15.2 seconds   INR 1.23   Glucose, capillary     Status: Abnormal   Collection Time: 10/10/16 11:40 AM  Result Value Ref Range   Glucose-Capillary 333 (H) 65 -  99 mg/dL   Comment 1 Notify RN   Glucose, capillary     Status: Abnormal   Collection Time: 10/10/16  4:37 PM  Result Value Ref Range   Glucose-Capillary 343 (H) 65 - 99 mg/dL  Glucose, capillary     Status: Abnormal   Collection Time: 10/10/16  8:49 PM  Result Value Ref Range   Glucose-Capillary 294 (H) 65 - 99 mg/dL  Basic metabolic panel     Status: Abnormal   Collection Time: 10/11/16  7:50 AM  Result Value Ref Range   Sodium 137 135 - 145 mmol/L   Potassium 4.0 3.5 - 5.1 mmol/L   Chloride 106 101 - 111 mmol/L   CO2 24 22 - 32 mmol/L   Glucose, Bld 192 (H) 65 - 99 mg/dL   BUN 23 (H) 6 - 20 mg/dL   Creatinine, Ser 1.13 0.61 - 1.24 mg/dL   Calcium 8.3 (L) 8.9 - 10.3 mg/dL   GFR calc non Af Amer >60 >60 mL/min   GFR calc Af Amer >60 >60 mL/min    Comment: (NOTE) The eGFR has been calculated using the CKD EPI equation. This calculation has not been validated in all clinical situations. eGFR's persistently <60 mL/min  signify possible Chronic Kidney Disease.    Anion gap 7 5 - 15  CBC with Differential/Platelet     Status: Abnormal   Collection Time: 10/11/16  7:50 AM  Result Value Ref Range   WBC 18.5 (H) 4.0 - 10.5 K/uL   RBC 2.56 (L) 4.22 - 5.81 MIL/uL   Hemoglobin 6.2 (LL) 13.0 - 17.0 g/dL    Comment: REPEATED TO VERIFY CRITICAL RESULT CALLED TO, READ BACK BY AND VERIFIED WITH: P LOVE,PA [phone] WILDERK    HCT 21.2 (L) 39.0 - 52.0 %   MCV 82.8 78.0 - 100.0 fL   MCH 24.2 (L) 26.0 - 34.0 pg   MCHC 29.2 (L) 30.0 - 36.0 g/dL   RDW 22.4 (H) 11.5 - 15.5 %   Platelets 49 (L) 150 - 400 K/uL    Comment: SPECIMEN CHECKED FOR CLOTS REPEATED TO VERIFY CONSISTENT WITH PREVIOUS RESULT    Neutrophils Relative % 76 %   Lymphocytes Relative 13 %   Monocytes Relative 7 %   Eosinophils Relative 3 %   Basophils Relative 1 %   Neutro Abs 14.0 (H) 1.7 - 7.7 K/uL   Lymphs Abs 2.4 0.7 - 4.0 K/uL   Monocytes Absolute 1.3 (H) 0.1 - 1.0 K/uL   Eosinophils Absolute 0.6 0.0 - 0.7  K/uL   Basophils Absolute 0.2 (H) 0.0 - 0.1 K/uL   RBC Morphology POLYCHROMASIA PRESENT     Comment: BURR CELLS ELLIPTOCYTES    WBC Morphology MILD LEFT SHIFT (1-5% METAS, OCC MYELO, OCC BANDS)   Glucose, capillary     Status: Abnormal   Collection Time: 10/11/16  8:24 AM  Result Value Ref Range   Glucose-Capillary 193 (H) 65 - 99 mg/dL  CBC     Status: Abnormal   Collection Time: 10/11/16 11:21 AM  Result Value Ref Range   WBC 21.6 (H) 4.0 - 10.5 K/uL   RBC 2.63 (L) 4.22 - 5.81 MIL/uL   Hemoglobin 6.3 (LL) 13.0 - 17.0 g/dL    Comment: REPEATED TO VERIFY CRITICAL VALUE NOTED.  VALUE IS CONSISTENT WITH PREVIOUSLY REPORTED AND CALLED VALUE.    HCT 21.8 (L) 39.0 - 52.0 %   MCV 82.9 78.0 - 100.0 fL   MCH 24.0 (L) 26.0 - 34.0 pg   MCHC 28.9 (L) 30.0 - 36.0 g/dL   RDW 22.4 (H) 11.5 - 15.5 %   Platelets 42 (L) 150 - 400 K/uL    Comment: REPEATED TO VERIFY CONSISTENT WITH PREVIOUS RESULT   Reticulocytes     Status: Abnormal   Collection Time: 10/11/16 11:21 AM  Result Value Ref Range   Retic Ct Pct 9.5 (H) 0.4 - 3.1 %   RBC. 2.69 (L) 4.22 - 5.81 MIL/uL   Retic Count, Manual 255.6 (H) 19.0 - 186.0 K/uL  Save smear     Status: None   Collection Time: 10/11/16 11:21 AM  Result Value Ref Range   Smear Review SMEAR STAINED AND AVAILABLE FOR REVIEW   Glucose, capillary     Status: Abnormal   Collection Time: 10/11/16 11:54 AM  Result Value Ref Range   Glucose-Capillary 184 (H) 65 - 99 mg/dL  Prepare RBC     Status: None   Collection Time: 10/11/16  3:37 PM  Result Value Ref Range   Order Confirmation ORDER PROCESSED BY BLOOD BANK   Type and screen [place]     Status: None (Preliminary result)   Collection Time: 10/11/16  3:38 PM  Result  Value Ref Range   ABO/RH(D) O POS    Antibody Screen NEG    Sample Expiration 10/14/2016    Unit Number [id number]    Blood Component Type RBC LR PHER1    Unit division 00    Status of Unit ISSUED    Transfusion  Status OK TO TRANSFUSE    Crossmatch Result Compatible    Unit Number [id number]    Blood Component Type RED CELLS,LR    Unit division 00    Status of Unit ALLOCATED    Transfusion Status OK TO TRANSFUSE    Crossmatch Result Compatible   ABO/Rh     Status: None   Collection Time: 10/11/16  3:38 PM  Result Value Ref Range   ABO/RH(D) O POS   Glucose, capillary     Status: Abnormal   Collection Time: 10/11/16  4:50 PM  Result Value Ref Range   Glucose-Capillary 187 (H) 65 - 99 mg/dL   Comment 1 Notify RN   CBC     Status: Abnormal   Collection Time: 10/11/16  6:11 PM  Result Value Ref Range   WBC 16.2 (H) 4.0 - 10.5 K/uL   RBC 2.52 (L) 4.22 - 5.81 MIL/uL   Hemoglobin 6.1 (LL) 13.0 - 17.0 g/dL    Comment: REPEATED TO VERIFY CRITICAL VALUE NOTED.  VALUE IS CONSISTENT WITH PREVIOUSLY REPORTED AND CALLED VALUE.    HCT 20.4 (L) 39.0 - 52.0 %   MCV 81.0 78.0 - 100.0 fL   MCH 24.2 (L) 26.0 - 34.0 pg   MCHC 29.9 (L) 30.0 - 36.0 g/dL   RDW 22.3 (H) 11.5 - 15.5 %   Platelets 44 (L) 150 - 400 K/uL    Comment: REPEATED TO VERIFY CONSISTENT WITH PREVIOUS RESULT   DIC (disseminated intravasc coag) panel     Status: Abnormal   Collection Time: 10/11/16  6:11 PM  Result Value Ref Range   Prothrombin Time 16.3 (H) 11.4 - 15.2 seconds   INR 1.30    aPTT 33 24 - 36 seconds   Fibrinogen 248 210 - 475 mg/dL   D-Dimer, Quant >20.00 (H) 0.00 - 0.50 ug/mL-FEU    Comment: REPEATED TO VERIFY (NOTE) At the manufacturer cut-off of 0.50 ug/mL FEU, this assay has been documented to exclude PE with a sensitivity and negative predictive value of 97 to 99%.  At this time, this assay has not been approved by the FDA to exclude DVT/VTE. Results should be correlated with clinical presentation.    Platelets 44 (L) 150 - 400 K/uL    Comment: REPEATED TO VERIFY CONSISTENT WITH PREVIOUS RESULT    Smear Review SCHISTOCYTES NOTED ON SMEAR   Lactate dehydrogenase     Status: Abnormal   Collection  Time: 10/11/16  6:11 PM  Result Value Ref Range   LDH 297 (H) 98 - 192 U/L  Haptoglobin     Status: Abnormal   Collection Time: 10/11/16  6:11 PM  Result Value Ref Range   Haptoglobin 203 (H) 34 - 200 mg/dL    Comment: (NOTE) Performed At: [facility] [place], [place] [id number] Lindon Romp MD IW:[id number]   Ferritin     Status: None   Collection Time: 10/11/16  6:11 PM  Result Value Ref Range   Ferritin 36 24 - 336 ng/mL  Iron and TIBC     Status: Abnormal   Collection Time: 10/11/16  6:11 PM  Result Value Ref Range   Iron 23 (L)  45 - 182 ug/dL   TIBC 675 612 - 548 ug/dL   Saturation Ratios 6 (L) 17.9 - 39.5 %   UIBC 373 ug/dL  CBC     Status: Abnormal   Collection Time: 10/11/16  8:34 PM  Result Value Ref Range   WBC 16.6 (H) 4.0 - 10.5 K/uL   RBC 2.44 (L) 4.22 - 5.81 MIL/uL   Hemoglobin 5.9 (LL) 13.0 - 17.0 g/dL    Comment: REPEATED TO VERIFY CRITICAL VALUE NOTED.  VALUE IS CONSISTENT WITH PREVIOUSLY REPORTED AND CALLED VALUE.    HCT 19.7 (L) 39.0 - 52.0 %   MCV 80.7 78.0 - 100.0 fL   MCH 24.2 (L) 26.0 - 34.0 pg   MCHC 29.9 (L) 30.0 - 36.0 g/dL   RDW 32.3 (H) 46.8 - 87.3 %   Platelets 38 (L) 150 - 400 K/uL    Comment: REPEATED TO VERIFY PLATELET COUNT CONFIRMED BY SMEAR   Glucose, capillary     Status: Abnormal   Collection Time: 10/11/16  9:05 PM  Result Value Ref Range   Glucose-Capillary 141 (H) 65 - 99 mg/dL   Comment 1 Notify RN   CBC     Status: Abnormal   Collection Time: 10/12/16  2:28 AM  Result Value Ref Range   WBC 16.7 (H) 4.0 - 10.5 K/uL   RBC 2.55 (L) 4.22 - 5.81 MIL/uL   Hemoglobin 6.1 (LL) 13.0 - 17.0 g/dL    Comment: REPEATED TO VERIFY SPECIMEN CHECKED FOR CLOTS CRITICAL VALUE NOTED.  VALUE IS CONSISTENT WITH PREVIOUSLY REPORTED AND CALLED VALUE.    HCT 21.3 (L) 39.0 - 52.0 %   MCV 83.5 78.0 - 100.0 fL   MCH 23.9 (L) 26.0 - 34.0 pg   MCHC 28.6 (L) 30.0 - 36.0 g/dL   RDW 73.0 (H) 81.6 - 83.8 %   Platelets 47  (L) 150 - 400 K/uL    Comment: REPEATED TO VERIFY SPECIMEN CHECKED FOR CLOTS CONSISTENT WITH PREVIOUS RESULT   Creatinine, serum     Status: Abnormal   Collection Time: 10/12/16  2:28 AM  Result Value Ref Range   Creatinine, Ser 1.44 (H) 0.61 - 1.24 mg/dL   GFR calc non Af Amer 46 (L) >60 mL/min   GFR calc Af Amer 53 (L) >60 mL/min    Comment: (NOTE) The eGFR has been calculated using the CKD EPI equation. This calculation has not been validated in all clinical situations. eGFR's persistently <60 mL/min signify possible Chronic Kidney Disease.   Direct antiglobulin test     Status: None   Collection Time: 10/12/16  2:28 AM  Result Value Ref Range   DAT, complement NEG    DAT, IgG NEG   Glucose, capillary     Status: Abnormal   Collection Time: 10/12/16  4:44 AM  Result Value Ref Range   Glucose-Capillary 204 (H) 65 - 99 mg/dL   Comment 1 Notify RN   Glucose, capillary     Status: Abnormal   Collection Time: 10/12/16  6:52 AM  Result Value Ref Range   Glucose-Capillary 219 (H) 65 - 99 mg/dL   Comment 1 Notify RN   Prepare RBC     Status: None   Collection Time: 10/12/16  8:06 AM  Result Value Ref Range   Order Confirmation ORDER PROCESSED BY BLOOD BANK   CBC     Status: Abnormal   Collection Time: 10/12/16  8:47 AM  Result Value Ref Range   WBC 16.9 (H)  4.0 - 10.5 K/uL   RBC 2.41 (L) 4.22 - 5.81 MIL/uL   Hemoglobin 5.9 (LL) 13.0 - 17.0 g/dL    Comment: REPEATED TO VERIFY CRITICAL VALUE NOTED.  VALUE IS CONSISTENT WITH PREVIOUSLY REPORTED AND CALLED VALUE.    HCT 19.6 (L) 39.0 - 52.0 %   MCV 81.3 78.0 - 100.0 fL   MCH 24.5 (L) 26.0 - 34.0 pg   MCHC 30.1 30.0 - 36.0 g/dL   RDW 22.6 (H) 11.5 - 15.5 %   Platelets 49 (L) 150 - 400 K/uL    Comment: REPEATED TO VERIFY CONSISTENT WITH PREVIOUS RESULT       General: NAD. Vital signs reviewed.  Psych: Unable to assess due to aphasia Heart: Tachycardia. Regular rhythm. No JVD. Lungs: Unlabored. Clear. Abdomen:  Positive bowel sounds, soft  Skin: Erythema on dorsum of left foot. No rash on back noted.  Neurologic: Alert Appears to have more exp than rec aphasia (unchanged) Inconsistently following commands, moving b/l UE and LLE. Nonverbal, global aphasia Unable to assess MMT and sensation due to aphasia Musculoskeletal: No edema. No tenderness.  Assessment/Plan: 1. 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 Physiatrist is providing close team supervision and 24 hour management of active medical problems listed below. Physiatrist and rehab team continue to assess barriers to discharge/monitor patient progress toward functional and medical goals. FIM: Function - Bathing Position: Bed Body parts bathed by patient: Right arm, Chest, Abdomen, Front perineal area, Right upper leg, Left upper leg Body parts bathed by helper: Left arm, Buttocks, Right lower leg, Left lower leg, Back Bathing not applicable: Right lower leg, Left lower leg Assist Level:  (Moderate assist)  Function- Upper Body Dressing/Undressing What is the patient wearing?: Hospital gown Pull over shirt/dress - Perfomed by patient: Thread/unthread right sleeve, Thread/unthread left sleeve Pull over shirt/dress - Perfomed by helper: Put head through opening, Pull shirt over trunk Assist Level:  (moderate ) Function - Lower Body Dressing/Undressing What is the patient wearing?: Non-skid slipper socks Position: Bed Pants- Performed by patient: Pull pants up/down Pants- Performed by helper: Thread/unthread right pants leg, Thread/unthread left pants leg, Fasten/unfasten pants Non-skid slipper socks- Performed by helper: Don/doff right sock, Don/doff left sock Assist for footwear: Dependant Assist for lower body dressing: 2 Helpers  Function - Toileting Toileting activity did not occur:  No continent bowel/bladder event Toileting steps completed by helper: Adjust clothing prior to toileting, Performs perineal hygiene, Adjust clothing after toileting Assist level: Two helpers  Function - Air cabin crew transfer assistive device: Grab bar Assist level to toilet: 2 helpers Assist level from toilet: 2 helpers  Function - Chair/bed transfer Chair/bed transfer activity did not occur: Safety/medical concerns Chair/bed transfer method: Squat pivot Chair/bed transfer assist level: 2 helpers Chair/bed transfer assistive device: Armrests Chair/bed transfer details: Manual facilitation for weight shifting, Tactile cues for posture, Verbal cues for sequencing, Verbal cues for technique  Function - Locomotion: Wheelchair Will patient use wheelchair at discharge?: Yes Type: Manual Wheelchair activity did not occur: Safety/medical concerns Assist Level: Total assistance (Pt < 25%) Wheel 50 feet with 2 turns activity did not occur: Safety/medical concerns Wheel 150 feet activity did not occur: Safety/medical concerns Function - Locomotion: Ambulation Ambulation activity did not occur: Safety/medical concerns Walk 10 feet activity did not occur: Safety/medical concerns Walk 50 feet with 2 turns activity did not occur: Safety/medical concerns  Walk 150 feet activity did not occur: Safety/medical concerns Walk 10 feet on uneven surfaces activity did not occur: Safety/medical concerns  Function - Comprehension Comprehension: Auditory Comprehension assist level: Understands basic 25 - 49% of the time/ requires cueing 50 - 75% of the time  Function - Expression Expression: Verbal Expression assist level: Expresses basis less than 25% of the time/requires cueing >75% of the time.  Function - Social Interaction Social Interaction assist level: Interacts appropriately 25 - 49% of time - Needs frequent redirection.  Function - Problem Solving Problem solving assist level:  Solves basic 25 - 49% of the time - needs direction more than half the time to initiate, plan or complete simple activities  Function - Memory Memory assist level: Recognizes or recalls less than 25% of the time/requires cueing greater than 75% of the time Patient normally able to recall (first 3 days only): That he or she is in a hospital   Medical Problem List and Plan: 1.  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  DVT Prophylaxis/Anticoagulation: Subcutaneous Lovenox, holding.  Monitor platelet counts and any signs of bleeding  SCDs ordered 3. Pain Management: Tylenol as needed 4. Dysphagia. Dysphagia #1 nectar liquids. Monitor for any signs of aspiration.   Added daily at bedtime IV fluids 5. Neuropsych: This patient is capable of making decisions on his own behalf. 6. Skin/Wound Care: Routine skin checks 7. Fluids/Electrolytes/Nutrition: Routine I&Os 8. Aspiration pneumonia. Augmentin 8 doses initiated 10/02/2016. 9. Diabetes mellitus with peripheral neuropathy. Hemoglobin A1c 7.2.   Check blood sugars before meals and at bedtime  Lantus insulin 12 units daily, increased to 16U on 12/26.  Novolog 4U with meals added 12/25, increased to 8U on 12/26, increased to 12U on 12/27  Improving, cont to monitor 10. Hypertension/tachycardia. Lopressor 12.5 mg twice a day. Monitor heart rate with increased mobility  Overall controlled 12/28 11. Stage III chronic kidney disease. Baseline creatinine 1.62. Follow-up chemistries  BMP Latest Ref Rng & Units 10/12/2016 10/11/2016 10/10/2016  Glucose 65 - 99 mg/dL - 192(H) 202(H)  BUN 6 - 20 mg/dL - 23(H) 22(H)  Creatinine 0.61 - 1.24 mg/dL 1.44(H) 1.13 1.16  Sodium 135 - 145 mmol/L - 137 136  Potassium 3.5 - 5.1 mmol/L - 4.0 3.8  Chloride 101 - 111 mmol/L - 106 105  CO2 22 - 32 mmol/L - 24 24  Calcium 8.9 - 10.3 mg/dL - 8.3(L) 8.3(L)    12. Mood. Depakote 250 mg BID added for bouts of agitation and restlessness. 13. Hypothyroidism. Synthroid 14. Hyperlipidemia. Lipitor 15. Anemia, acute blood loss.   Severe drop in Hb, Hb 5.9 on 12/28   Hemoccult + 12/25  Cont to monitor  Pt refused transfusion yesterday, will perform today  GI consulted, spoke to GI with plans for EGD, appreciate recs  Heme/Onc consulted - ?DIC vs microscopic bleed, spoke to Heme/Onc, appreciate recs 16.  Leukocytosis: likely from from UTI  Febrile 12/23, afebrile since  CXR reviewed 12/22 with stable infiltrates, cont to monitor  WBCs 16.9 on 12/28 on abx  Foley replaced  UA+, Ucx Citrobacter   Repeat CXR with new opacities, started Vanc/Zosyn 17. Thrombocytopenia:   Cont to drop  Plts 49 on 12/28, Lovenox held, < 60,000/mm3 no resistive exercise  SRA remains pending  Will cont to monitor 18. Hypoalbuminemia  Supplement started 12/23 19. Acute lower UTI  Foley to stay in  place, difficult placement with tight foreskin.  Will consider Urology consult  Ucx Citrobacter  Cipro started 12/24 for UTI +/- PNA 20. Left foot erythema  Possible related to trauma  Pt is already on abx. 21. AKI on CKD  Cr. 1.44 on 12/28  Cont to monitor  Improving  Will cont to monitor  LOS (Days) 7 A FACE TO FACE EVALUATION WAS PERFORMED  Kiaria Quinnell Lorie Phenix 10/12/2016, 10:17 AM

## 2016-10-12 NOTE — Progress Notes (Signed)
Pt found lying on the floor beside his bed, siderail was up on that side and pt had slipped throught between the siderail and end of bed, foley catheter stretching over from right side of bed through the area at end of bed,  small amount of bleeding from meatus, pt incontinent of bm in brief, pt denies pain, palpated skull, spine, hips, , ROM to all extremities without c/o, no grimacing, no s/s of pain or limited movement, no new bruising, no lacerations, no swelling, no s/s of head trauma, pt  Returned to bed with maximove 3 assist, pericare rendered, repositioned pt, VSS,  CBG=204. Call to Marissa NestlePam Love, PA-C on call for Dr Wynn BankerKirsteins, report given, no orders , call to pt niece, Harriett Sineancy, report fall and that pt VSS, appears unharmed, states understanding. Asks if restraints will be used, let her know we try not to use restraints but will continue with bed alarm and frequent checks, mittens if necessary. VS will contine in 2 hours then q 4 hours for 24 hours.

## 2016-10-13 ENCOUNTER — Inpatient Hospital Stay (HOSPITAL_COMMUNITY): Payer: Medicare Other

## 2016-10-13 ENCOUNTER — Inpatient Hospital Stay (HOSPITAL_COMMUNITY): Payer: Medicare Other | Admitting: Occupational Therapy

## 2016-10-13 ENCOUNTER — Inpatient Hospital Stay (HOSPITAL_COMMUNITY): Payer: Medicare Other | Admitting: Speech Pathology

## 2016-10-13 ENCOUNTER — Inpatient Hospital Stay (HOSPITAL_COMMUNITY): Payer: Medicare Other | Admitting: Physical Therapy

## 2016-10-13 LAB — CBC
HCT: 29.1 % — ABNORMAL LOW (ref 39.0–52.0)
HEMATOCRIT: 27.1 % — AB (ref 39.0–52.0)
HEMATOCRIT: 26.4 % — AB (ref 39.0–52.0)
HEMATOCRIT: 26.9 % — AB (ref 39.0–52.0)
HEMOGLOBIN: 8.6 g/dL — AB (ref 13.0–17.0)
HEMOGLOBIN: 8.2 g/dL — AB (ref 13.0–17.0)
HEMOGLOBIN: 8.4 g/dL — AB (ref 13.0–17.0)
Hemoglobin: 9.1 g/dL — ABNORMAL LOW (ref 13.0–17.0)
MCH: 25.5 pg — ABNORMAL LOW (ref 26.0–34.0)
MCH: 25.5 pg — ABNORMAL LOW (ref 26.0–34.0)
MCH: 25.4 pg — ABNORMAL LOW (ref 26.0–34.0)
MCH: 25.5 pg — AB (ref 26.0–34.0)
MCHC: 31.7 g/dL (ref 30.0–36.0)
MCHC: 31.3 g/dL (ref 30.0–36.0)
MCHC: 31.1 g/dL (ref 30.0–36.0)
MCHC: 31.2 g/dL (ref 30.0–36.0)
MCV: 80.4 fL (ref 78.0–100.0)
MCV: 81.5 fL (ref 78.0–100.0)
MCV: 81.7 fL (ref 78.0–100.0)
MCV: 81.5 fL (ref 78.0–100.0)
PLATELETS: 71 10*3/uL — AB (ref 150–400)
PLATELETS: 75 10*3/uL — AB (ref 150–400)
Platelets: 69 10*3/uL — ABNORMAL LOW (ref 150–400)
Platelets: 73 10*3/uL — ABNORMAL LOW (ref 150–400)
RBC: 3.37 MIL/uL — ABNORMAL LOW (ref 4.22–5.81)
RBC: 3.57 MIL/uL — ABNORMAL LOW (ref 4.22–5.81)
RBC: 3.23 MIL/uL — AB (ref 4.22–5.81)
RBC: 3.3 MIL/uL — AB (ref 4.22–5.81)
RDW: 20.6 % — ABNORMAL HIGH (ref 11.5–15.5)
RDW: 20.8 % — AB (ref 11.5–15.5)
RDW: 20.8 % — ABNORMAL HIGH (ref 11.5–15.5)
RDW: 20.9 % — ABNORMAL HIGH (ref 11.5–15.5)
WBC: 15.4 10*3/uL — ABNORMAL HIGH (ref 4.0–10.5)
WBC: 17.3 10*3/uL — AB (ref 4.0–10.5)
WBC: 16.5 10*3/uL — AB (ref 4.0–10.5)
WBC: 16.1 10*3/uL — AB (ref 4.0–10.5)

## 2016-10-13 LAB — VITAMIN B12: VITAMIN B 12: 4320 pg/mL — AB (ref 180–914)

## 2016-10-13 LAB — TYPE AND SCREEN
BLOOD PRODUCT EXPIRATION DATE: 201801102359
Blood Product Expiration Date: 201801092359
ISSUE DATE / TIME: 201712280941
ISSUE DATE / TIME: 201712281329
Unit Type and Rh: 5100
Unit Type and Rh: 5100

## 2016-10-13 LAB — GLUCOSE, CAPILLARY
Glucose-Capillary: 153 mg/dL — ABNORMAL HIGH (ref 65–99)
Glucose-Capillary: 350 mg/dL — ABNORMAL HIGH (ref 65–99)
Glucose-Capillary: 275 mg/dL — ABNORMAL HIGH (ref 65–99)

## 2016-10-13 LAB — HEPATIC FUNCTION PANEL
ALK PHOS: 197 U/L — AB (ref 38–126)
ALT: 38 U/L (ref 17–63)
AST: 50 U/L — ABNORMAL HIGH (ref 15–41)
Albumin: 1.9 g/dL — ABNORMAL LOW (ref 3.5–5.0)
BILIRUBIN DIRECT: 0.5 mg/dL (ref 0.1–0.5)
BILIRUBIN INDIRECT: 1.7 mg/dL — AB (ref 0.3–0.9)
BILIRUBIN TOTAL: 2.2 mg/dL — AB (ref 0.3–1.2)
Total Protein: 5.8 g/dL — ABNORMAL LOW (ref 6.5–8.1)

## 2016-10-13 LAB — ADAMTS13 ACTIVITY REFLEX

## 2016-10-13 LAB — SEROTONIN RELEASE ASSAY (SRA)
SRA 100IU/mL UFH Ser-aCnc: 1 % (ref 0–20)
SRA, LOW DOSE HEPARIN: 1 % (ref 0–20)

## 2016-10-13 LAB — ADAMTS13 ACTIVITY: ADAMTS 13 ACTIVITY: 36.8 % — AB (ref >66.8)

## 2016-10-13 MED ORDER — SODIUM CHLORIDE 0.9 % IV SOLN
510.0000 mg | Freq: Once | INTRAVENOUS | Status: AC
  Administered 2016-10-13: 510 mg via INTRAVENOUS
  Filled 2016-10-13 (×2): qty 17

## 2016-10-13 NOTE — Progress Notes (Signed)
Speech Language Pathology Weekly Progress and Session Note  Patient Details  Name: Douglas [name] MRN: [id number] Date of Birth: 1941/09/17  Beginning of progress report period: October 06, 2016   End of progress report period: October 13, 2016   Today's Date: 10/13/2016 SLP Individual Time: 1300-1400 SLP Individual Time Calculation (min): 60 min   Short Term Goals: Week 1: SLP Short Term Goal 1 (Week 1): Pt will answer yes/no questions for 75% accuracy with max assist multimodal cues.   SLP Short Term Goal 1 - Progress (Week 1): Met SLP Short Term Goal 2 (Week 1): Pt will follow 1 step commands for 75% accuracy with max assist multimodal cues.   SLP Short Term Goal 2 - Progress (Week 1): Met SLP Short Term Goal 3 (Week 1): Pt will consume dys 1 textures and nectar thick liquids with mod assist for use of swallowing precautions and minimal overt s/s of aspiration.   SLP Short Term Goal 3 - Progress (Week 1): Revised due to lack of progress SLP Short Term Goal 4 (Week 1): Pt will verbalize at the word level during structured tasks for 50% accuracy with max assist multimodal cues. SLP Short Term Goal 4 - Progress (Week 1): Progressing toward goal SLP Short Term Goal 5 (Week 1): Pt will utilize multimodal means of communication to convey needs and wants to caregivers with max assist cues.   SLP Short Term Goal 5 - Progress (Week 1): Met    New Short Term Goals: Week 2: SLP Short Term Goal 1 (Week 2): Pt will answer yes/no questions for 75% accuracy with max assist verbal cues.   SLP Short Term Goal 2 (Week 2): Pt will follow 1 step commands for 75% accuracy with max assist verbal cues.   SLP Short Term Goal 3 (Week 2): Pt will consume dys 1 textures and honey thick liquids via teaspoon with mod assist for use of swallowing precautions and minimal overt s/s of aspiration.   SLP Short Term Goal 4 (Week 2): Pt will verbalize at the word level during structured tasks for 50% accuracy with  max assist multimodal cues. SLP Short Term Goal 5 (Week 2): Pt will utilize multimodal means of communication to convey needs and wants to caregivers with mod assist cues.    Weekly Progress Updates: Pt has made slow functional gains this reporting period and has met 3 out of 6 short term goals.  Pt's diet has been downgraded to dys 1, honey thick liquids due to slow progress and evidence of moderately severe oropharyngeal dysphagia which is further exacerbated by lethargy and significant cognitive impairment.  Pt is demonstrating improved ability to answer functional yes/no questions and follow 1-step commands with max assist multimodal cues.  Pt is also able to convey simple, immediate, personally relevant information via multimodal means with max assist; however, his verbal expression remains limited.  As a result, pt would continue to benefit from skilled ST while inpatient in order to maximize functional independence and reduce burden of care prior to discharge.  Pt and family education has been limited but therapist has met with pt's niece and nephew briefly to update them regarding pt's current progress and goals in therapy.     Intensity: Minumum of 1-2 x/day, 30 to 90 minutes Frequency: 3 to 5 out of 7 days Duration/Length of Stay: 28 days  Treatment/Interventions: Cognitive remediation/compensation;Cueing hierarchy;Dysphagia/aspiration precaution training;Environmental controls;Functional tasks;Internal/external aids;Patient/family education   Daily Session  Skilled Therapeutic Interventions: Pt was seen for skilled  ST targeting goals for dysphagia and communication.  Therapist facilitated the session with mod-max assist multimodal cues for use of swallowing precautions during lunch meal of dys 1 textures and honey thick liquids via teaspoon.  Pt's swallowing safety remains impacted by motor planning impairment, attention to boluses, and impulsivity with intake although pt demonstrated no  overt s/s of aspiration with purees or honey thick liquids.  Pt required max to total assist to identify an object when named from a field of two and was unable to name basic familiar objects.  Pt was able to convey his needs/wants to therapist via yes/no responses in ~75% of opportunities with max assist multimodal cues.  Pt was also able to follow 1 step commands within the functional context of transferring back to bed and assisting in bed mobility with mod-max assist multimodal cues.  Pt was left in bed with nursing at bedside.  Goals updated on this date to reflect current progress and plan of care.        Function:   Eating Eating   Modified Consistency Diet: Yes Eating Assist Level: Supervision or verbal cues;Helper scoops food on utensil;Help with picking up utensils;Help managing cup/glass;Hand over hand assist;Set up assist for   Eating Set Up Assist For: Opening containers Helper Scoops Food on Utensil: Occasionally Helper Brings Food to Mouth: Occasionally   Cognition Comprehension Comprehension assist level: Understands basic 50 - 74% of the time/ requires cueing 25 - 49% of the time  Expression   Expression assist level: Expresses basic 25 - 49% of the time/requires cueing 50 - 75% of the time. Uses single words/gestures.  Social Interaction Social Interaction assist level: Interacts appropriately 50 - 74% of the time - May be physically or verbally inappropriate.  Problem Solving Problem solving assist level: Solves basic 25 - 49% of the time - needs direction more than half the time to initiate, plan or complete simple activities  Memory Memory assist level: Recognizes or recalls less than 25% of the time/requires cueing greater than 75% of the time   General    Pain Pain Assessment Pain Assessment: No/denies pain Faces Pain Scale: No hurt  Therapy/Group: Individual Therapy  Jaquelinne Glendening, Selinda Orion 10/13/2016, 3:02 PM

## 2016-10-13 NOTE — Progress Notes (Signed)
Douglas AlimentJames L [name]   DOB:07/25/1941   WU#:[id number]    Subjective: The patient felt better since blood transfusion.The patient denies any recent signs or symptoms of bleeding such as spontaneous epistaxis, hematuria or hematochezia.   Objective:  Vitals:   10/13/16 0000 10/13/16 0400  BP: 134/60 (!) 136/50  Pulse: (!) 102 (!) 103  Resp: 18 18  Temp: 98.9 F (37.2 C) 99.5 F (37.5 C)     Intake/Output Summary (Last 24 hours) at 10/13/16 1008 Last data filed at 10/13/16 0500  Gross per 24 hour  Intake           1233.5 ml  Output             2175 ml  Net           -941.5 ml    GENERAL:alert, no distress and comfortable SKIN: skin color, texture, turgor are normal, no rashes or significant lesions EYES: normal, Conjunctiva are pink and non-injected, sclera clear Musculoskeletal:no cyanosis of digits and no clubbing  NEURO: alert & oriented x 3 with Dysarthria and persistent left-sided weakness   Labs:  Lab Results  Component Value Date   WBC 15.4 (H) 10/13/2016   HGB 8.6 (L) 10/13/2016   HCT 27.1 (L) 10/13/2016   MCV 80.4 10/13/2016   PLT 69 (L) 10/13/2016   NEUTROABS 14.0 (H) 10/11/2016    Lab Results  Component Value Date   NA 137 10/11/2016   K 4.0 10/11/2016   CL 106 10/11/2016   CO2 24 10/11/2016    Studies:  Ct Abdomen Pelvis Wo Contrast  Result Date: 10/11/2016 CLINICAL DATA:  Recent hospitalization for stroke, on Plavix therapy. Progressive anemia with hemoccult positive stool. Question retroperitoneal bleed. EXAM: CT ABDOMEN AND PELVIS WITHOUT CONTRAST TECHNIQUE: Multidetector CT imaging of the abdomen and pelvis was performed following the standard protocol without IV contrast. COMPARISON:  Renal ultrasound 09/27/2016. Abdominal MRI 07/12/2010, chest x-ray 10/11/2016. FINDINGS: Lower chest: Small dependent right pleural effusion. There is prominence of the right hilum with patchy right infrahilar opacity. The heart size is normal. There is coronary artery  atherosclerosis. Bilateral gynecomastia noted. Hepatobiliary: As evaluated in the noncontrast state, the liver appears unremarkable. No evidence of gallstones, gallbladder wall thickening or biliary dilatation. Pancreas: Unremarkable. No pancreatic ductal dilatation or surrounding inflammatory changes. Spleen: Normal in size without focal abnormality. Adrenals/Urinary Tract: Both adrenal glands appear normal. 2 cm low-density lesion in the upper pole the right kidney on image 27 corresponds with a cyst on ultrasound. No evidence of urinary tract calculus, hydronephrosis or perinephric soft tissue stranding. Foley catheter is in place. There is mild bladder wall thickening. Gas in the bladder lumen attributed to the Foley. Stomach/Bowel: Bowel assessment limited by the lack of intravenous and enteric contrast. No evidence of bowel wall thickening, distention or surrounding inflammatory change. Diverticular changes throughout the distal colon, greatest within the sigmoid colon. The appendix appears normal. Vascular/Lymphatic: There are no enlarged abdominal or pelvic lymph nodes. Aortic and branch vessel atherosclerosis. Reproductive: Moderate enlargement of the prostate gland. Other: No evidence of retroperitoneal hematoma. No evidence of ascites or hemoperitoneum. Musculoskeletal: No acute or significant osseous findings. Mild lumbar spondylosis. IMPRESSION: 1. No evidence of retroperitoneal hematoma or other acute findings. 2. No clear explanation for anemia or heme-positive stool on noncontrast imaging. Further bowel evaluation may be warranted. 3. Small right pleural effusion with right infrahilar opacity and prominence of the right hilum, seen on radiographs today. Chest radiographic follow up recommended to exclude thoracic  neoplasm. 4. Additional incidental findings, including a stable right renal cyst, bladder wall thickening, colonic diverticulosis and aortic atherosclerosis. Electronically Signed   By:  Douglas [name].   On: 10/11/2016 16:47   Dg Chest 2 View  Result Date: 10/11/2016 CLINICAL DATA:  Leukocytosis EXAM: CHEST  2 VIEW COMPARISON:  October 06, 2016 FINDINGS: There is patchy infiltrate in the right middle lobe region, new. A small amount of patchy opacity in the anterior segment of the right upper lobe is stable. Left lung is clear. Heart size and pulmonary vascularity are normal. No appreciable adenopathy. No bone lesions. There is calcification in the ascending thoracic aorta. IMPRESSION: Pain patchy infiltrate right middle lobe. Stable patchy infiltrate anterior segment right upper lobe. Left lung clear. Stable cardiac silhouette. Aortic atherosclerosis. Electronically Signed   By: Douglas BangWilliam  Woodruff [name] [name].   On: 10/11/2016 14:38    Assessment & Plan:   Progressive anemia Multifactorial, combination of anemia of chronic disease, iron deficiency anemia likely due to microscopic GI bleed and bone marrow suppression from recent infection He received 2 units of blood transfusion on 10/04/2016 with stable blood count I have discontinued antiplatelet agents after discussion with primary service We discussed some of the risks, benefits, and alternatives of intravenous iron infusions. The patient is symptomatic from anemia and the iron level is critically low. He tolerated oral iron supplement poorly and desires to achieved higher levels of iron faster for adequate hematopoesis. Some of the side-effects to be expected including risks of infusion reactions, phlebitis, headaches, nausea and fatigue.  The patient is willing to proceed. I plan to give him 1 dose of iron Feraheme today I would defer to GI for consideration for repeat endoscopy workup, discussed with GI service  Acute thrombocytopenia Multifactorial, likely due to bone marrow suppression, urinary tract infection, pneumonia and mild liver disease as evidenced from recent workup. He does not need platelet  transfusion Recommend close monitoring I would not recommend resuming antiplatelet agents until platelet count is consistently above 50,000 x 2 days I recommend daily folic acid supplementation  Urinary tract infection and possible aspiration pneumonia Continue antibiotic therapy as directed by primary service  Discharge planning Not ready for discharge Please call/consult hematology service over the weekend if questions arise Dr. Mosetta [name] will resume care next week  Douglas DelayNi Matricia Begnaud, MD 10/13/2016  10:08 AM

## 2016-10-13 NOTE — Progress Notes (Signed)
Occupational Therapy Weekly Progress Note  Patient Details  Name: Douglas [name] MRN: [id number] Date of Birth: 11-Feb-1941  Beginning of progress report period: October 06, 2016 End of progress report period: October 13, 2016  Today's Date: 10/13/2016 OT Individual Time: 1245-1310 OT Individual Time Calculation (min): 25 min   Patient has met 1 of 5 short term goals.  Pt is progressing slowly towards OT goals 2/2 fatigue and difficulty maintaining alertness. Pts alertness waxes and wanes which greatly impacts his ability to participate in self-care tasks. Pt more alert for PT this morning and per PT report, pt able to complete 4/4 steps of UB dressing with Vc. Pt requires total A for LB ADLs and Max/Total A for stand-pivot transfers. Patient continues to demonstrate the following deficits: muscle weakness, impaired timing and sequencing, abnormal tone, unbalanced muscle activation, motor apraxia, ataxia, decreased coordination and decreased motor planning, decreased attention to right and ideational apraxia, decreased initiation, decreased attention, decreased awareness, decreased problem solving, decreased safety awareness, decreased memory and delayed processing, and decreased sitting balance, decreased standing balance, decreased postural control, hemiplegia and decreased balance strategies and therefore will continue to benefit from skilled OT intervention to enhance overall performance with BADL and Reduce care partner burden.  Patient progressing toward long term goals..  Continue plan of care.  OT Short Term Goals Week 1:  OT Short Term Goal 1 (Week 1): Pt will maintain unsupported sitting balance with min assist for 2 mins to engage in self-care task OT Short Term Goal 1 - Progress (Week 1): Not met OT Short Term Goal 2 (Week 1): Pt will transfer to toilet with max assist of 1 caregiver OT Short Term Goal 2 - Progress (Week 1): Not met OT Short Term Goal 3 (Week 1): Pt will complete  sit > stand with max assist of one caregiver to progress towards LB dressing at sit > stand level OT Short Term Goal 3 - Progress (Week 1): Not met OT Short Term Goal 4 (Week 1): Pt will complete LB dressing with max assist  OT Short Term Goal 4 - Progress (Week 1): Not met OT Short Term Goal 5 (Week 1): Pt will complete 2 grooming tasks with < mod verbal cues due to ideational apraxia OT Short Term Goal 5 - Progress (Week 1): Met Week 2:  OT Short Term Goal 1 (Week 2): Pt will maintain unsupported sitting balance with min assist for 2 mins to engage in self-care task OT Short Term Goal 2 (Week 2): Pt will transfer to toilet with max assist of 1 caregiver OT Short Term Goal 3 (Week 2): Pt will complete sit > stand with max assist of one caregiver to progress towards LB dressing at sit > stand level OT Short Term Goal 4 (Week 2): Pt will complete LB dressing with max assist   Skilled Therapeutic Interventions/Progress Updates:    Pt supine in bed w/ waxing and waning alertness. Environmental changes and tactile cues to wake pt. Max/Total A to come to sidelying. R UE NMR in gravity eliminated side-lying position. Provided joint input to bring R UE through full ROM. Scap protraction/retraction, shoulder flex/ext, elbow flex ext. Pt left semi-reclined in bed at end of session with needs met and bed alarm on.   Therapy Documentation Precautions:  Precautions Precautions: Fall Precaution Comments: R side inattention, strong push to R side, posterior lean Restrictions Weight Bearing Restrictions: No Pain: Pain Assessment Pain Assessment: No/denies pain Faces Pain Scale: No hurt  See Function  Navigator for Current Functional Status.   Therapy/Group: Individual Therapy  Valma Cava 10/13/2016, 3:17 PM

## 2016-10-13 NOTE — Progress Notes (Signed)
Speech Language Pathology Note  Patient Details  Name: Douglas [name] MRN: [id number] Date of Birth: 08/17/1941 Today's Date: 10/13/2016  MBSS complete. Full report located under chart review in imaging section.    Douglas [name], Douglas [name] 10/13/2016, 12:23 PM

## 2016-10-13 NOTE — Progress Notes (Signed)
Physical Therapy Session Note  Patient Details  Name: Douglas [name] MRN: [id number] Date of Birth: 05/01/1941  Today's Date: 10/13/2016 PT Individual Time: 1004-1100 PT Individual Time Calculation (min): 56 min    Short Term Goals: Week 1:  PT Short Term Goal 1 (Week 1): Pt will transition to sitting EOB with +1 assist PT Short Term Goal 2 (Week 1): Pt will transfer to w/c with +1 assist and LRAD PT Short Term Goal 3 (Week 1): Pt will initiate gait training with PT  Skilled Therapeutic Interventions/Progress Updates:    Pt resting in bed on arrival, no c/o pain, agreeable to therapy session.  Session focus on bed mobility for LB dressing, transfers, postural control and midline orientation during static and dynamic sitting balance activities.    Pt performed LB dressing at bed level with total assist for TEDs, assist to thread LEs into shorts and fasten (pt able to bridge and pull shorts over hips with increased time), and assist to position LEs for pt to don socks.  Supine>sit with mod assist for trunk and to scoot to EOB.  Squat/pivot to w/c on pt's L with total assist and total assist to position in midline in chair.  UB dressing from w/c with min assist to thread RUE through sleeve.    Static and dynamic sitting balance edge of mat focus on attaining and maintaining midline orientation without visual feedback during reaching outside BOS task.  Pt completed task x12 reps to R, L, and forward with close supervision and occasional steady assist to return to midline.  Pt returned to w/c at end of session with total assist +1 and positioned upright in nursing station with QRB in place.    Therapy Documentation Precautions:  Precautions Precautions: Fall Precaution Comments: R side inattention, strong push to R side, posterior lean Restrictions Weight Bearing Restrictions: No   See Function Navigator for Current Functional Status.   Therapy/Group: Individual Therapy  Tyrelle Raczka E  Penven-Crew 10/13/2016, 11:04 AM

## 2016-10-13 NOTE — Progress Notes (Signed)
Subjective/Complaints: Pt sitting up in bed this AM.  Transfused yesterday.  He is alert, but essentially nonverbal  Review of systems: Unable to obtain secondary to aphasia  Objective: Vital Signs: Blood pressure (!) 136/50, pulse (!) 103, temperature 99.5 F (37.5 C), temperature source Oral, resp. rate 18, weight 72.3 kg (159 lb 6.3 oz), SpO2 97 %. Ct Abdomen Pelvis Wo Contrast  Result Date: 10/11/2016 CLINICAL DATA:  Recent hospitalization for stroke, on Plavix therapy. Progressive anemia with hemoccult positive stool. Question retroperitoneal bleed. EXAM: CT ABDOMEN AND PELVIS WITHOUT CONTRAST TECHNIQUE: Multidetector CT imaging of the abdomen and pelvis was performed following the standard protocol without IV contrast. COMPARISON:  Renal ultrasound 09/27/2016. Abdominal MRI 07/12/2010, chest x-ray 10/11/2016. FINDINGS: Lower chest: Small dependent right pleural effusion. There is prominence of the right hilum with patchy right infrahilar opacity. The heart size is normal. There is coronary artery atherosclerosis. Bilateral gynecomastia noted. Hepatobiliary: As evaluated in the noncontrast state, the liver appears unremarkable. No evidence of gallstones, gallbladder wall thickening or biliary dilatation. Pancreas: Unremarkable. No pancreatic ductal dilatation or surrounding inflammatory changes. Spleen: Normal in size without focal abnormality. Adrenals/Urinary Tract: Both adrenal glands appear normal. 2 cm low-density lesion in the upper pole the right kidney on image 27 corresponds with a cyst on ultrasound. No evidence of urinary tract calculus, hydronephrosis or perinephric soft tissue stranding. Foley catheter is in place. There is mild bladder wall thickening. Gas in the bladder lumen attributed to the Foley. Stomach/Bowel: Bowel assessment limited by the lack of intravenous and enteric contrast. No evidence of bowel wall thickening, distention or surrounding inflammatory change.  Diverticular changes throughout the distal colon, greatest within the sigmoid colon. The appendix appears normal. Vascular/Lymphatic: There are no enlarged abdominal or pelvic lymph nodes. Aortic and branch vessel atherosclerosis. Reproductive: Moderate enlargement of the prostate gland. Other: No evidence of retroperitoneal hematoma. No evidence of ascites or hemoperitoneum. Musculoskeletal: No acute or significant osseous findings. Mild lumbar spondylosis. IMPRESSION: 1. No evidence of retroperitoneal hematoma or other acute findings. 2. No clear explanation for anemia or heme-positive stool on noncontrast imaging. Further bowel evaluation may be warranted. 3. Small right pleural effusion with right infrahilar opacity and prominence of the right hilum, seen on radiographs today. Chest radiographic follow up recommended to exclude thoracic neoplasm. 4. Additional incidental findings, including a stable right renal cyst, bladder wall thickening, colonic diverticulosis and aortic atherosclerosis. Electronically Signed   By: Richardean Sale M.D.   On: 10/11/2016 16:47   Dg Chest 2 View  Result Date: 10/11/2016 CLINICAL DATA:  Leukocytosis EXAM: CHEST  2 VIEW COMPARISON:  October 06, 2016 FINDINGS: There is patchy infiltrate in the right middle lobe region, new. A small amount of patchy opacity in the anterior segment of the right upper lobe is stable. Left lung is clear. Heart size and pulmonary vascularity are normal. No appreciable adenopathy. No bone lesions. There is calcification in the ascending thoracic aorta. IMPRESSION: Pain patchy infiltrate right middle lobe. Stable patchy infiltrate anterior segment right upper lobe. Left lung clear. Stable cardiac silhouette. Aortic atherosclerosis. Electronically Signed   By: Lowella Grip III M.D.   On: 10/11/2016 14:38   Dg Swallowing Func-speech Pathology  Result Date: 10/13/2016 Objective Swallowing Evaluation: Type of Study: Bedside Swallow Evaluation  Patient Details Name: Douglas [name] MRN: [id number] Date of Birth: 05-26-1941 Today's Date: 10/13/2016 Time: SLP Start Time : 0905 SLP Stop Time: 0935 SLP Time Calculation (min) (ACUTE ONLY): 30 min Past Medical History: Past  Medical History: Diagnosis Date . CKD (chronic kidney disease)  . DM (diabetes mellitus) ([place])  . Hyperlipidemia  . Hypertension  Past Surgical History: No past surgical history on file. HPI: 75 y.o. male with medical history significant of HTN, [name], DM, [name] presenting with a stroke. Pt with noted R sided weakness and expressive aphasia. 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 Assessment / Plan / Recommendation CHL IP CLINICAL IMPRESSIONS 10/13/2016 Therapy Diagnosis Moderate oral phase dysphagia;Moderate pharyngeal phase dysphagia Clinical Impression Pt presents with a moderately severe multifactorial dysphagia.  Oral phase deficits are characterized by right sided weakness impacting containment and transit of boluses which lead to premature spillage of materials into the oropharynx.  This in combination with decreased pharyngeal sensation, base of tongue weakness, lethargy, and poor attention to boluses in the setting of severe cognitive impairment results in a significantly delayed response with severe pooling in the pyriforms prior to initiation of swallow.  Delay in swallow initiation resulted in x1 episode of deep, silent penetration with nectar thick liquids, although SLP suspects this to be happening more frequently during meals given the severity of pt's deficits and functional impairments noted at bedside.  Pt appeared to have better control and cohesion of honey thick liquids via teaspoon without episodes of aspiration or penetration on study.  Suspect careful control of bolus size and increased sensory input from heavier boluses to be effective techniques for maximizing airway protection during the swallow.  As a result, recommend downgrading  pt's diet to honey thick liquids via teaspoon with ongoing dys 1 textures and full supervision for strict adherence to swallowing precautions.  Prognosis for advancement good with ongoing ST interventions for management of safe diet progression and as pt's alertness improves.   Impact on safety and function Moderate aspiration risk     Prognosis 10/13/2016 Prognosis for Safe Diet Advancement Good Barriers to Reach Goals Cognitive deficits;Language deficits Barriers/Prognosis Comment -- CHL IP DIET RECOMMENDATION 10/13/2016 SLP Diet Recommendations Dysphagia 1 (Puree) solids;Honey thick liquids Liquid Administration via Spoon Medication Administration Crushed with puree Compensations Slow rate;Small sips/bites;Clear throat intermittently;Lingual sweep for clearance of pocketing Postural Changes Seated upright at 90 degrees            CHL IP ORAL PHASE 10/13/2016 Oral Phase Impaired Oral - Pudding Teaspoon -- Oral - Pudding Cup -- Oral - Honey Teaspoon Piecemeal swallowing;Lingual/palatal residue;Right anterior bolus loss;Weak lingual manipulation;Reduced posterior propulsion;Delayed oral transit;Premature spillage;Decreased bolus cohesion Oral - Honey Cup Right anterior bolus loss;Weak lingual manipulation;Reduced posterior propulsion;Piecemeal swallowing;Decreased bolus cohesion;Premature spillage;Delayed oral transit;Lingual/palatal residue Oral - Nectar Teaspoon Right anterior bolus loss;Weak lingual manipulation;Reduced posterior propulsion;Lingual/palatal residue;Piecemeal swallowing;Decreased bolus cohesion;Premature spillage Oral - Nectar Cup Right anterior bolus loss;Weak lingual manipulation;Reduced posterior propulsion;Lingual/palatal residue;Decreased bolus cohesion;Premature spillage Oral - Nectar Straw -- Oral - Thin Teaspoon -- Oral - Thin Cup -- Oral - Thin Straw -- Oral - Puree Lingual/palatal residue;Delayed oral transit Oral - Mech Soft -- Oral - Regular -- Oral - Multi-Consistency -- Oral - Pill --  Oral Phase - Comment --  CHL IP PHARYNGEAL PHASE 10/13/2016 Pharyngeal Phase Impaired Pharyngeal- Pudding Teaspoon -- Pharyngeal -- Pharyngeal- Pudding Cup -- Pharyngeal -- Pharyngeal- Honey Teaspoon Delayed swallow initiation-vallecula;Delayed swallow initiation-pyriform sinuses;Reduced anterior laryngeal mobility;Reduced laryngeal elevation;Reduced tongue base retraction;Pharyngeal residue - valleculae;Pharyngeal residue - pyriform;Pharyngeal residue - posterior pharnyx;Lateral channel residue Pharyngeal -- Pharyngeal- Honey Cup Delayed swallow initiation-pyriform sinuses;Reduced anterior laryngeal mobility;Reduced laryngeal elevation;Reduced tongue base retraction;Pharyngeal residue - valleculae;Pharyngeal residue - pyriform;Pharyngeal residue -  posterior pharnyx;Lateral channel residue Pharyngeal -- Pharyngeal- Nectar Teaspoon Delayed swallow initiation-pyriform sinuses;Reduced anterior laryngeal mobility;Reduced laryngeal elevation;Reduced tongue base retraction;Pharyngeal residue - valleculae;Pharyngeal residue - pyriform;Pharyngeal residue - posterior pharnyx;Lateral channel residue Pharyngeal -- Pharyngeal- Nectar Cup Delayed swallow initiation-pyriform sinuses;Reduced anterior laryngeal mobility;Reduced laryngeal elevation;Reduced tongue base retraction;Pharyngeal residue - valleculae;Pharyngeal residue - pyriform;Pharyngeal residue - posterior pharnyx;Lateral channel residue;Penetration/Aspiration during swallow Pharyngeal Material enters airway, CONTACTS cords and not ejected out Pharyngeal- Nectar Straw -- Pharyngeal -- Pharyngeal- Thin Teaspoon -- Pharyngeal -- Pharyngeal- Thin Cup -- Pharyngeal -- Pharyngeal- Thin Straw -- Pharyngeal -- Pharyngeal- Puree Delayed swallow initiation-vallecula;Reduced anterior laryngeal mobility;Reduced laryngeal elevation;Reduced tongue base retraction;Pharyngeal residue - valleculae;Pharyngeal residue - pyriform;Pharyngeal residue - posterior pharnyx Pharyngeal --  Pharyngeal- Mechanical Soft -- Pharyngeal -- Pharyngeal- Regular -- Pharyngeal -- Pharyngeal- Multi-consistency -- Pharyngeal -- Pharyngeal- Pill -- Pharyngeal -- Pharyngeal Comment --  No flowsheet data found. No flowsheet data found. Page, Selinda Orion 10/13/2016, 12:20 PM              Results for orders placed or performed during the hospital encounter of 10/05/16 (from the past 72 hour(s))  Glucose, capillary     Status: Abnormal   Collection Time: 10/10/16  4:37 PM  Result Value Ref Range   Glucose-Capillary 343 (H) 65 - 99 mg/dL  Glucose, capillary     Status: Abnormal   Collection Time: 10/10/16  8:49 PM  Result Value Ref Range   Glucose-Capillary 294 (H) 65 - 99 mg/dL  Basic metabolic panel     Status: Abnormal   Collection Time: 10/11/16  7:50 AM  Result Value Ref Range   Sodium 137 135 - 145 mmol/L   Potassium 4.0 3.5 - 5.1 mmol/L   Chloride 106 101 - 111 mmol/L   CO2 24 22 - 32 mmol/L   Glucose, Bld 192 (H) 65 - 99 mg/dL   BUN 23 (H) 6 - 20 mg/dL   Creatinine, Ser 1.13 0.61 - 1.24 mg/dL   Calcium 8.3 (L) 8.9 - 10.3 mg/dL   GFR calc non Af Amer >60 >60 mL/min   GFR calc Af Amer >60 >60 mL/min    Comment: (NOTE) The eGFR has been calculated using the CKD EPI equation. This calculation has not been validated in all clinical situations. eGFR's persistently <60 mL/min signify possible Chronic Kidney Disease.    Anion gap 7 5 - 15  CBC with Differential/Platelet     Status: Abnormal   Collection Time: 10/11/16  7:50 AM  Result Value Ref Range   WBC 18.5 (H) 4.0 - 10.5 K/uL   RBC 2.56 (L) 4.22 - 5.81 MIL/uL   Hemoglobin 6.2 (LL) 13.0 - 17.0 g/dL    Comment: REPEATED TO VERIFY CRITICAL RESULT CALLED TO, READ BACK BY AND VERIFIED WITH: P LOVE,PA [phone] WILDERK    HCT 21.2 (L) 39.0 - 52.0 %   MCV 82.8 78.0 - 100.0 fL   MCH 24.2 (L) 26.0 - 34.0 pg   MCHC 29.2 (L) 30.0 - 36.0 g/dL   RDW 22.4 (H) 11.5 - 15.5 %   Platelets 49 (L) 150 - 400 K/uL    Comment: SPECIMEN  CHECKED FOR CLOTS REPEATED TO VERIFY CONSISTENT WITH PREVIOUS RESULT    Neutrophils Relative % 76 %   Lymphocytes Relative 13 %   Monocytes Relative 7 %   Eosinophils Relative 3 %   Basophils Relative 1 %   Neutro Abs 14.0 (H) 1.7 - 7.7 K/uL   Lymphs Abs 2.4 0.7 -  4.0 K/uL   Monocytes Absolute 1.3 (H) 0.1 - 1.0 K/uL   Eosinophils Absolute 0.6 0.0 - 0.7 K/uL   Basophils Absolute 0.2 (H) 0.0 - 0.1 K/uL   RBC Morphology POLYCHROMASIA PRESENT     Comment: BURR CELLS ELLIPTOCYTES    WBC Morphology MILD LEFT SHIFT (1-5% METAS, OCC MYELO, OCC BANDS)   Glucose, capillary     Status: Abnormal   Collection Time: 10/11/16  8:24 AM  Result Value Ref Range   Glucose-Capillary 193 (H) 65 - 99 mg/dL  CBC     Status: Abnormal   Collection Time: 10/11/16 11:21 AM  Result Value Ref Range   WBC 21.6 (H) 4.0 - 10.5 K/uL   RBC 2.63 (L) 4.22 - 5.81 MIL/uL   Hemoglobin 6.3 (LL) 13.0 - 17.0 g/dL    Comment: REPEATED TO VERIFY CRITICAL VALUE NOTED.  VALUE IS CONSISTENT WITH PREVIOUSLY REPORTED AND CALLED VALUE.    HCT 21.8 (L) 39.0 - 52.0 %   MCV 82.9 78.0 - 100.0 fL   MCH 24.0 (L) 26.0 - 34.0 pg   MCHC 28.9 (L) 30.0 - 36.0 g/dL   RDW 22.4 (H) 11.5 - 15.5 %   Platelets 42 (L) 150 - 400 K/uL    Comment: REPEATED TO VERIFY CONSISTENT WITH PREVIOUS RESULT   Reticulocytes     Status: Abnormal   Collection Time: 10/11/16 11:21 AM  Result Value Ref Range   Retic Ct Pct 9.5 (H) 0.4 - 3.1 %   RBC. 2.69 (L) 4.22 - 5.81 MIL/uL   Retic Count, Manual 255.6 (H) 19.0 - 186.0 K/uL  Save smear     Status: None   Collection Time: 10/11/16 11:21 AM  Result Value Ref Range   Smear Review SMEAR STAINED AND AVAILABLE FOR REVIEW   Glucose, capillary     Status: Abnormal   Collection Time: 10/11/16 11:54 AM  Result Value Ref Range   Glucose-Capillary 184 (H) 65 - 99 mg/dL  Prepare RBC     Status: None   Collection Time: 10/11/16  3:37 PM  Result Value Ref Range   Order Confirmation ORDER PROCESSED BY BLOOD  BANK   Type and screen [place]     Status: None   Collection Time: 10/11/16  3:38 PM  Result Value Ref Range   ISSUE DATE / TIME [id number]    Blood Product Unit Number [id number]    PRODUCT CODE [id number]    Unit Type and Rh 5100    Blood Product Expiration Date [id number]    ISSUE DATE / TIME [id number]    Blood Product Unit Number [id number]    PRODUCT CODE [id number]    Unit Type and Rh 5100    Blood Product Expiration Date [id number]   ABO/Rh     Status: None   Collection Time: 10/11/16  3:38 PM  Result Value Ref Range   ABO/RH(D) O POS   Glucose, capillary     Status: Abnormal   Collection Time: 10/11/16  4:50 PM  Result Value Ref Range   Glucose-Capillary 187 (H) 65 - 99 mg/dL   Comment 1 Notify RN   CBC     Status: Abnormal   Collection Time: 10/11/16  6:11 PM  Result Value Ref Range   WBC 16.2 (H) 4.0 - 10.5 K/uL   RBC 2.52 (L) 4.22 - 5.81 MIL/uL   Hemoglobin 6.1 (LL) 13.0 - 17.0 g/dL    Comment: REPEATED TO VERIFY CRITICAL VALUE NOTED.  VALUE IS CONSISTENT  WITH PREVIOUSLY REPORTED AND CALLED VALUE.    HCT 20.4 (L) 39.0 - 52.0 %   MCV 81.0 78.0 - 100.0 fL   MCH 24.2 (L) 26.0 - 34.0 pg   MCHC 29.9 (L) 30.0 - 36.0 g/dL   RDW 22.3 (H) 11.5 - 15.5 %   Platelets 44 (L) 150 - 400 K/uL    Comment: REPEATED TO VERIFY CONSISTENT WITH PREVIOUS RESULT   DIC (disseminated intravasc coag) panel     Status: Abnormal   Collection Time: 10/11/16  6:11 PM  Result Value Ref Range   Prothrombin Time 16.3 (H) 11.4 - 15.2 seconds   INR 1.30    aPTT 33 24 - 36 seconds   Fibrinogen 248 210 - 475 mg/dL   D-Dimer, Quant >20.00 (H) 0.00 - 0.50 ug/mL-FEU    Comment: REPEATED TO VERIFY (NOTE) At the manufacturer cut-off of 0.50 ug/mL FEU, this assay has been documented to exclude PE with a sensitivity and negative predictive value of 97 to 99%.  At this time, this assay has not been approved by the FDA to exclude DVT/VTE. Results should be  correlated with clinical presentation.    Platelets 44 (L) 150 - 400 K/uL    Comment: REPEATED TO VERIFY CONSISTENT WITH PREVIOUS RESULT    Smear Review SCHISTOCYTES NOTED ON SMEAR   Lactate dehydrogenase     Status: Abnormal   Collection Time: 10/11/16  6:11 PM  Result Value Ref Range   LDH 297 (H) 98 - 192 U/L  Haptoglobin     Status: Abnormal   Collection Time: 10/11/16  6:11 PM  Result Value Ref Range   Haptoglobin 203 (H) 34 - 200 mg/dL    Comment: (NOTE) Performed At: [facility] [address] [place], [place] [id number] Lindon Romp MD PP:[phone]   Ferritin     Status: None   Collection Time: 10/11/16  6:11 PM  Result Value Ref Range   Ferritin 36 24 - 336 ng/mL  Iron and TIBC     Status: Abnormal   Collection Time: 10/11/16  6:11 PM  Result Value Ref Range   Iron 23 (L) 45 - 182 ug/dL   TIBC 396 250 - 450 ug/dL   Saturation Ratios 6 (L) 17.9 - 39.5 %   UIBC 373 ug/dL  CBC     Status: Abnormal   Collection Time: 10/11/16  8:34 PM  Result Value Ref Range   WBC 16.6 (H) 4.0 - 10.5 K/uL   RBC 2.44 (L) 4.22 - 5.81 MIL/uL   Hemoglobin 5.9 (LL) 13.0 - 17.0 g/dL    Comment: REPEATED TO VERIFY CRITICAL VALUE NOTED.  VALUE IS CONSISTENT WITH PREVIOUSLY REPORTED AND CALLED VALUE.    HCT 19.7 (L) 39.0 - 52.0 %   MCV 80.7 78.0 - 100.0 fL   MCH 24.2 (L) 26.0 - 34.0 pg   MCHC 29.9 (L) 30.0 - 36.0 g/dL   RDW 22.4 (H) 11.5 - 15.5 %   Platelets 38 (L) 150 - 400 K/uL    Comment: REPEATED TO VERIFY PLATELET COUNT CONFIRMED BY SMEAR   Glucose, capillary     Status: Abnormal   Collection Time: 10/11/16  9:05 PM  Result Value Ref Range   Glucose-Capillary 141 (H) 65 - 99 mg/dL   Comment 1 Notify RN   CBC     Status: Abnormal   Collection Time: 10/12/16  2:28 AM  Result Value Ref Range   WBC 16.7 (H) 4.0 - 10.5 K/uL  RBC 2.55 (L) 4.22 - 5.81 MIL/uL   Hemoglobin 6.1 (LL) 13.0 - 17.0 g/dL    Comment: REPEATED TO VERIFY SPECIMEN CHECKED FOR CLOTS CRITICAL  VALUE NOTED.  VALUE IS CONSISTENT WITH PREVIOUSLY REPORTED AND CALLED VALUE.    HCT 21.3 (L) 39.0 - 52.0 %   MCV 83.5 78.0 - 100.0 fL   MCH 23.9 (L) 26.0 - 34.0 pg   MCHC 28.6 (L) 30.0 - 36.0 g/dL   RDW 22.9 (H) 11.5 - 15.5 %   Platelets 47 (L) 150 - 400 K/uL    Comment: REPEATED TO VERIFY SPECIMEN CHECKED FOR CLOTS CONSISTENT WITH PREVIOUS RESULT   Creatinine, serum     Status: Abnormal   Collection Time: 10/12/16  2:28 AM  Result Value Ref Range   Creatinine, Ser 1.44 (H) 0.61 - 1.24 mg/dL   GFR calc non Af Amer 46 (L) >60 mL/min   GFR calc Af Amer 53 (L) >60 mL/min    Comment: (NOTE) The eGFR has been calculated using the CKD EPI equation. This calculation has not been validated in all clinical situations. eGFR's persistently <60 mL/min signify possible Chronic Kidney Disease.   Direct antiglobulin test     Status: None   Collection Time: 10/12/16  2:28 AM  Result Value Ref Range   DAT, complement NEG    DAT, IgG NEG   Glucose, capillary     Status: Abnormal   Collection Time: 10/12/16  4:44 AM  Result Value Ref Range   Glucose-Capillary 204 (H) 65 - 99 mg/dL   Comment 1 Notify RN   Glucose, capillary     Status: Abnormal   Collection Time: 10/12/16  6:52 AM  Result Value Ref Range   Glucose-Capillary 219 (H) 65 - 99 mg/dL   Comment 1 Notify RN   Prepare RBC     Status: None   Collection Time: 10/12/16  8:06 AM  Result Value Ref Range   Order Confirmation ORDER PROCESSED BY BLOOD BANK   CBC     Status: Abnormal   Collection Time: 10/12/16  8:47 AM  Result Value Ref Range   WBC 16.9 (H) 4.0 - 10.5 K/uL   RBC 2.41 (L) 4.22 - 5.81 MIL/uL   Hemoglobin 5.9 (LL) 13.0 - 17.0 g/dL    Comment: REPEATED TO VERIFY CRITICAL VALUE NOTED.  VALUE IS CONSISTENT WITH PREVIOUSLY REPORTED AND CALLED VALUE.    HCT 19.6 (L) 39.0 - 52.0 %   MCV 81.3 78.0 - 100.0 fL   MCH 24.5 (L) 26.0 - 34.0 pg   MCHC 30.1 30.0 - 36.0 g/dL   RDW 22.6 (H) 11.5 - 15.5 %   Platelets 49 (L) 150 -  400 K/uL    Comment: REPEATED TO VERIFY CONSISTENT WITH PREVIOUS RESULT   Glucose, capillary     Status: Abnormal   Collection Time: 10/12/16 11:57 AM  Result Value Ref Range   Glucose-Capillary 257 (H) 65 - 99 mg/dL   Comment 1 Notify RN   Glucose, capillary     Status: Abnormal   Collection Time: 10/12/16  4:53 PM  Result Value Ref Range   Glucose-Capillary 234 (H) 65 - 99 mg/dL   Comment 1 Notify RN   CBC     Status: Abnormal   Collection Time: 10/12/16  8:45 PM  Result Value Ref Range   WBC 15.4 (H) 4.0 - 10.5 K/uL   RBC 3.62 (L) 4.22 - 5.81 MIL/uL   Hemoglobin 9.2 (L) 13.0 - 17.0 g/dL  Comment: POST TRANSFUSION SPECIMEN   HCT 29.2 (L) 39.0 - 52.0 %   MCV 80.7 78.0 - 100.0 fL   MCH 25.4 (L) 26.0 - 34.0 pg   MCHC 31.5 30.0 - 36.0 g/dL   RDW 19.7 (H) 11.5 - 15.5 %   Platelets 54 (L) 150 - 400 K/uL    Comment: REPEATED TO VERIFY CONSISTENT WITH PREVIOUS RESULT   Glucose, capillary     Status: Abnormal   Collection Time: 10/12/16  9:39 PM  Result Value Ref Range   Glucose-Capillary 115 (H) 65 - 99 mg/dL   Comment 1 Notify RN   Vitamin B12     Status: Abnormal   Collection Time: 10/13/16  3:11 AM  Result Value Ref Range   Vitamin B-12 4,320 (H) 180 - 914 pg/mL    Comment: (NOTE) This assay is not validated for testing neonatal or myeloproliferative syndrome specimens for Vitamin B12 levels.   Hepatic function panel     Status: Abnormal   Collection Time: 10/13/16  3:11 AM  Result Value Ref Range   Total Protein 5.8 (L) 6.5 - 8.1 g/dL   Albumin 1.9 (L) 3.5 - 5.0 g/dL   AST 50 (H) 15 - 41 U/L   ALT 38 17 - 63 U/L   Alkaline Phosphatase 197 (H) 38 - 126 U/L   Total Bilirubin 2.2 (H) 0.3 - 1.2 mg/dL   Bilirubin, Direct 0.5 0.1 - 0.5 mg/dL   Indirect Bilirubin 1.7 (H) 0.3 - 0.9 mg/dL  CBC     Status: Abnormal   Collection Time: 10/13/16  5:32 AM  Result Value Ref Range   WBC 15.4 (H) 4.0 - 10.5 K/uL   RBC 3.37 (L) 4.22 - 5.81 MIL/uL   Hemoglobin 8.6 (L) 13.0 -  17.0 g/dL   HCT 27.1 (L) 39.0 - 52.0 %   MCV 80.4 78.0 - 100.0 fL   MCH 25.5 (L) 26.0 - 34.0 pg   MCHC 31.7 30.0 - 36.0 g/dL   RDW 20.6 (H) 11.5 - 15.5 %   Platelets 69 (L) 150 - 400 K/uL    Comment: PLATELET COUNT CONFIRMED BY SMEAR  Glucose, capillary     Status: Abnormal   Collection Time: 10/13/16  6:46 AM  Result Value Ref Range   Glucose-Capillary 153 (H) 65 - 99 mg/dL   Comment 1 Notify RN   Glucose, capillary     Status: Abnormal   Collection Time: 10/13/16 11:41 AM  Result Value Ref Range   Glucose-Capillary 350 (H) 65 - 99 mg/dL  CBC     Status: Abnormal   Collection Time: 10/13/16 12:37 PM  Result Value Ref Range   WBC 17.3 (H) 4.0 - 10.5 K/uL   RBC 3.57 (L) 4.22 - 5.81 MIL/uL   Hemoglobin 9.1 (L) 13.0 - 17.0 g/dL   HCT 29.1 (L) 39.0 - 52.0 %   MCV 81.5 78.0 - 100.0 fL   MCH 25.5 (L) 26.0 - 34.0 pg   MCHC 31.3 30.0 - 36.0 g/dL   RDW 20.8 (H) 11.5 - 15.5 %   Platelets 71 (L) 150 - 400 K/uL    Comment: REPEATED TO VERIFY CONSISTENT WITH PREVIOUS RESULT       General: NAD. Vital signs reviewed.  Psych: Unable to assess due to aphasia Heart: Regular rate and rhythm. No JVD. Lungs: Unlabored. Clear. Abdomen: Positive bowel sounds, soft  Skin: Erythema on dorsum of left foot. No rash on back noted.  Neurologic: Alert Appears to have more  exp than rec aphasia (stable) Inconsistently following commands, moving b/l UE and LLE. Nonverbal, global aphasia Unable to assess MMT and sensation due to aphasia Musculoskeletal: No edema. No tenderness.  Assessment/Plan: 1. 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 Physiatrist is providing close team supervision and 24 hour management of active medical problems listed below. Physiatrist and rehab team continue to assess barriers to discharge/monitor patient progress toward  functional and medical goals. FIM: Function - Bathing Position: Bed Body parts bathed by patient: Right arm, Chest, Abdomen, Front perineal area, Right upper leg, Left upper leg Body parts bathed by helper: Left arm, Buttocks, Right lower leg, Left lower leg, Back Bathing not applicable: Right lower leg, Left lower leg Assist Level:  (Moderate assist)  Function- Upper Body Dressing/Undressing What is the patient wearing?: Pull over shirt/dress Pull over shirt/dress - Perfomed by patient: Thread/unthread right sleeve, Thread/unthread left sleeve, Put head through opening, Pull shirt over trunk Pull over shirt/dress - Perfomed by helper: Put head through opening, Pull shirt over trunk Assist Level: Touching or steadying assistance(Pt > 75%) Function - Lower Body Dressing/Undressing What is the patient wearing?: Pants, Non-skid slipper socks, Ted Hose Position: Bed Pants- Performed by patient: Pull pants up/down Pants- Performed by helper: Thread/unthread right pants leg, Thread/unthread left pants leg, Fasten/unfasten pants Non-skid slipper socks- Performed by patient: Don/doff right sock, Don/doff left sock Non-skid slipper socks- Performed by helper: Don/doff right sock, Don/doff left sock TED Hose - Performed by helper: Don/doff right TED hose, Don/doff left TED hose Assist for footwear: Dependant Assist for lower body dressing: 2 Helpers  Function - Toileting Toileting activity did not occur: No continent bowel/bladder event Toileting steps completed by helper: Adjust clothing prior to toileting, Performs perineal hygiene, Adjust clothing after toileting Assist level: Two helpers  Function - Air cabin crew transfer assistive device: Bedside commode Assist level to toilet: 2 helpers Assist level from toilet: 2 helpers Assist level to bedside commode (at bedside): 2 helpers Assist level from bedside commode (at bedside): 2 helpers  Function - Chair/bed  transfer Chair/bed transfer activity did not occur: Safety/medical concerns Chair/bed transfer method: Squat pivot Chair/bed transfer assist level: Total assist (Pt < 25%) Chair/bed transfer assistive device: Armrests Chair/bed transfer details: Manual facilitation for weight shifting, Tactile cues for posture, Verbal cues for sequencing, Verbal cues for technique  Function - Locomotion: Wheelchair Will patient use wheelchair at discharge?: Yes Type: Manual Wheelchair activity did not occur: Safety/medical concerns Assist Level: Total assistance (Pt < 25%) Wheel 50 feet with 2 turns activity did not occur: Safety/medical concerns Wheel 150 feet activity did not occur: Safety/medical concerns Function - Locomotion: Ambulation Ambulation activity did not occur: Safety/medical concerns Walk 10 feet activity did not occur: Safety/medical concerns Walk 50 feet with 2 turns activity did not occur: Safety/medical concerns Walk 150 feet activity did not occur: Safety/medical concerns Walk 10 feet on uneven surfaces activity did not occur: Safety/medical concerns  Function - Comprehension Comprehension: Auditory Comprehension assist level: Understands basic 25 - 49% of the time/ requires cueing 50 - 75% of the time  Function - Expression Expression: Nonverbal Expression assist level: Expresses basic 25 - 49% of the time/requires cueing 50 - 75% of the time. Uses single words/gestures.  Function - Social Interaction Social Interaction assist level: Interacts appropriately 50 - 74% of the time - May be physically or verbally inappropriate.  Function - Problem Solving Problem solving assist level: Solves basic 25 - 49% of the time - needs direction more than half the time to initiate, plan or complete simple activities  Function - Memory Memory assist level: Recognizes or recalls less than 25% of the time/requires cueing greater than 75% of the time Patient normally able to recall (first 3  days only): That he or she is in a hospital   Medical Problem List and Plan: 1.  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  DVT Prophylaxis/Anticoagulation: Subcutaneous Lovenox, holding.  Monitor platelet counts and any signs of bleeding  SCDs ordered 3. Pain Management: Tylenol as needed 4. Dysphagia. Dysphagia #1 nectar liquids. Monitor for any signs of aspiration.   Added daily at bedtime IV fluids 5. Neuropsych: This patient is capable of making decisions on his own behalf. 6. Skin/Wound Care: Routine skin checks 7. Fluids/Electrolytes/Nutrition: Routine I&Os 8. Aspiration pneumonia. Augmentin 8 doses initiated 10/02/2016. 9. Diabetes mellitus with peripheral neuropathy. Hemoglobin A1c 7.2.   Check blood sugars before meals and at bedtime  Lantus insulin 12 units daily, increased to 16U on 12/26.  Novolog 4U with meals added 12/25, increased to 8U on 12/26, increased to [place] on 12/27  Was improving, extremely labile last 24 hours, will cont to monitor before further changes 10. Hypertension/tachycardia. Lopressor 12.5 mg twice a day. Monitor heart rate with increased mobility  Overall controlled 12/29 11. Stage III chronic kidney disease. Baseline creatinine 1.62. Follow-up chemistries  BMP Latest Ref Rng & Units 10/12/2016 10/11/2016 10/10/2016  Glucose 65 - 99 mg/dL - 192(H) 202(H)  BUN 6 - 20 mg/dL - 23(H) 22(H)  Creatinine 0.61 - 1.24 mg/dL 1.44(H) 1.13 1.16  Sodium 135 - 145 mmol/L - 137 136  Potassium 3.5 - 5.1 mmol/L - 4.0 3.8  Chloride 101 - 111 mmol/L - 106 105  CO2 22 - 32 mmol/L - 24 24  Calcium 8.9 - 10.3 mg/dL - 8.3(L) 8.3(L)   12. Mood. Depakote 250 mg BID added for bouts of agitation and restlessness. 13. Hypothyroidism. Synthroid 14. Hyperlipidemia. Lipitor 15. Anemia, acute blood loss.   Severe drop in Hb, Hb 9.1 on 12/29 (transfusion 12/28)   Hemoccult  + 12/25  Cont to monitor  GI consulted, spoke to GI with plans for EGD, family refused, spoke with GI again, cont PPI  Heme/Onc consulted - ?DIC vs microscopic bleed, spoke to Heme/Onc, appreciate recs 16.  Leukocytosis: likely from from UTI  Febrile 12/23, afebrile since  CXR reviewed 12/22 with stable infiltrates, cont to monitor  WBCs 17.3 on 12/29 on abx  Foley replaced  UA+, Ucx Citrobacter   Repeat CXR with new opacities, cont Vanc/Zosyn 17. Thrombocytopenia:   Cont to drop  Plts 71 on 12/29, Lovenox held, improving  SRA negative  Will cont to monitor 18. Hypoalbuminemia  Supplement started 12/23 19. Acute lower UTI  Foley to stay in place, difficult placement with tight foreskin.  Will consider Urology consult  Ucx Citrobacter  Cipro started 12/24 for UTI +/- PNA, changed to Vanc/Zosyn 20. Left foot erythema  Possible related to trauma  Pt is already on abx. 21. AKI on CKD  Cr. 1.44 on 12/28  Cont to monitor  Improving  Will cont to monitor  Labs ordered for Monday  LOS (Days) 8 A FACE TO FACE EVALUATION WAS PERFORMED  Ankit Lorie Phenix 10/13/2016, 1:02 PM

## 2016-10-14 ENCOUNTER — Inpatient Hospital Stay (HOSPITAL_COMMUNITY): Payer: Medicare Other | Admitting: Speech Pathology

## 2016-10-14 ENCOUNTER — Inpatient Hospital Stay (HOSPITAL_COMMUNITY): Payer: Medicare Other | Admitting: Occupational Therapy

## 2016-10-14 ENCOUNTER — Inpatient Hospital Stay (HOSPITAL_COMMUNITY): Payer: Medicare Other | Admitting: *Deleted

## 2016-10-14 LAB — BASIC METABOLIC PANEL
ANION GAP: 10 (ref 5–15)
BUN: 16 mg/dL (ref 6–20)
CALCIUM: 8.4 mg/dL — AB (ref 8.9–10.3)
CO2: 23 mmol/L (ref 22–32)
Chloride: 106 mmol/L (ref 101–111)
Creatinine, Ser: 1.32 mg/dL — ABNORMAL HIGH (ref 0.61–1.24)
GFR, EST AFRICAN AMERICAN: 59 mL/min — AB (ref >60)
GFR, EST NON AFRICAN AMERICAN: 51 mL/min — AB (ref >60)
Glucose, Bld: 112 mg/dL — ABNORMAL HIGH (ref 65–99)
POTASSIUM: 3.4 mmol/L — AB (ref 3.5–5.1)
SODIUM: 139 mmol/L (ref 135–145)

## 2016-10-14 LAB — GLUCOSE, CAPILLARY
GLUCOSE-CAPILLARY: 124 mg/dL — AB (ref 65–99)
GLUCOSE-CAPILLARY: 299 mg/dL — AB (ref 65–99)
GLUCOSE-CAPILLARY: 262 mg/dL — AB (ref 65–99)
Glucose-Capillary: 194 mg/dL — ABNORMAL HIGH (ref 65–99)

## 2016-10-14 LAB — CBC
HCT: 27.4 % — ABNORMAL LOW (ref 39.0–52.0)
HCT: 30.8 % — ABNORMAL LOW (ref 39.0–52.0)
HEMATOCRIT: 28.2 % — AB (ref 39.0–52.0)
HEMATOCRIT: 26.9 % — AB (ref 39.0–52.0)
HEMOGLOBIN: 8.7 g/dL — AB (ref 13.0–17.0)
HEMOGLOBIN: 9.6 g/dL — AB (ref 13.0–17.0)
HEMOGLOBIN: 8.3 g/dL — AB (ref 13.0–17.0)
Hemoglobin: 8.4 g/dL — ABNORMAL LOW (ref 13.0–17.0)
MCH: 24.9 pg — ABNORMAL LOW (ref 26.0–34.0)
MCH: 25.4 pg — ABNORMAL LOW (ref 26.0–34.0)
MCH: 25.9 pg — AB (ref 26.0–34.0)
MCH: 25.4 pg — ABNORMAL LOW (ref 26.0–34.0)
MCHC: 30.7 g/dL (ref 30.0–36.0)
MCHC: 30.9 g/dL (ref 30.0–36.0)
MCHC: 31.2 g/dL (ref 30.0–36.0)
MCHC: 30.9 g/dL (ref 30.0–36.0)
MCV: 81.3 fL (ref 78.0–100.0)
MCV: 82.2 fL (ref 78.0–100.0)
MCV: 83 fL (ref 78.0–100.0)
MCV: 82.3 fL (ref 78.0–100.0)
PLATELETS: 89 10*3/uL — AB (ref 150–400)
PLATELETS: 118 10*3/uL — AB (ref 150–400)
Platelets: 92 10*3/uL — ABNORMAL LOW (ref 150–400)
Platelets: 114 10*3/uL — ABNORMAL LOW (ref 150–400)
RBC: 3.37 MIL/uL — AB (ref 4.22–5.81)
RBC: 3.43 MIL/uL — AB (ref 4.22–5.81)
RBC: 3.71 MIL/uL — AB (ref 4.22–5.81)
RBC: 3.27 MIL/uL — ABNORMAL LOW (ref 4.22–5.81)
RDW: 21.2 % — AB (ref 11.5–15.5)
RDW: 21.3 % — ABNORMAL HIGH (ref 11.5–15.5)
RDW: 21.4 % — ABNORMAL HIGH (ref 11.5–15.5)
RDW: 21.2 % — AB (ref 11.5–15.5)
WBC: 16.7 10*3/uL — AB (ref 4.0–10.5)
WBC: 15.1 10*3/uL — AB (ref 4.0–10.5)
WBC: 19.2 10*3/uL — AB (ref 4.0–10.5)
WBC: 16.4 10*3/uL — ABNORMAL HIGH (ref 4.0–10.5)

## 2016-10-14 LAB — OCCULT BLOOD X 1 CARD TO LAB, STOOL: Fecal Occult Bld: POSITIVE — AB

## 2016-10-14 MED ORDER — INSULIN GLARGINE 100 UNIT/ML ~~LOC~~ SOLN
20.0000 [IU] | Freq: Every day | SUBCUTANEOUS | Status: DC
  Administered 2016-10-15 – 2016-10-22 (×8): 20 [IU] via SUBCUTANEOUS
  Filled 2016-10-14 (×8): qty 0.2

## 2016-10-14 NOTE — Progress Notes (Signed)
Physical Therapy Session Note  Patient Details  Name: Douglas [name] MRN: [id number] Date of Birth: 10/02/1941  Today's Date: 10/14/2016 PT Individual Time: 1140 (make up)-1155 PT Individual Time Calculation (min): 15 min & 60min    Short Term Goals: Week 1:  PT Short Term Goal 1 (Week 1): Pt will transition to sitting EOB with +1 assist PT Short Term Goal 2 (Week 1): Pt will transfer to w/c with +1 assist and LRAD PT Short Term Goal 3 (Week 1): Pt will initiate gait training with PT  Skilled Therapeutic Interventions/Progress Updates:   Tx1 (make up session): Pt presented in w/c agreeable to therapy. Performed sitting balance activities static and dynamic with pt away from back support.  Reaching across midline, high, low. Static unsupported sitting with cues for increased abdominal activation for improved sitting posture. Pt remained in w/c at end of session with mittens donned and QRB in place.   Tx2: Pt in w/c agreeable to therapy. Transported to rehab gym, attempted sit to stand in standing frame. Max cues for L foot placement 2/2 pt attempting to place foot on standing frame instead of floor. Sit to stand maxA x2 with pt demonstrating difficulty maintaining upright posture and significant R push. Pt with episode of incontinence while standing, returned to room. MaxA squat pivot transfer to bed for peri-cleaning. Upon completion pt returned to w/c, pt becoming impulsive and attempting to stand during transfer to w/c. Noted improved posture during standing attempt at bedside. Pt requiring max cues to perform squat pivot transfer to w/c which was done with maxA. Pt returned to rehab gym, re-attempted sit to/from stand at outside of // bars 2/2 improved posture in room. Pt performed sit to stand with mod/maxA x1 and maintained significantly improved posture however requiring cues for  for  20sec. Performed x2 trials. Pt returned to room with mittens donned and QRB placed.    Therapy  Documentation Precautions:  Precautions Precautions: Fall Precaution Comments: R side inattention, strong push to R side, posterior lean Restrictions Weight Bearing Restrictions: No General:   Vital Signs:  Pain:   Mobility:   Locomotion :    Trunk/Postural Assessment :    Balance:   Exercises:   Other Treatments:     See Function Navigator for Current Functional Status.   Therapy/Group: Individual Therapy  Allysson Rinehimer  Toure Edmonds, PTA  10/14/2016, 12:04 PM

## 2016-10-14 NOTE — Progress Notes (Signed)
Physical Therapy Weekly Progress Note  Patient Details  Name: Douglas [name] MRN: [id number] Date of Birth: 1940-10-25  Beginning of progress report period: October 06, 2016 End of progress report period: October 14, 2016  Patient has met 2 of 3 short term goals.  Pt made slow progress during the first part of the week 2/2 medical complications and decreased arousal.  On final day of reporting period, following blood transfusion, pt made some progress with therapy and was able to meet 2 of his STGs.    Patient continues to demonstrate the following deficits muscle weakness, decreased cardiorespiratoy endurance, impaired timing and sequencing, abnormal tone, unbalanced muscle activation, motor apraxia, decreased coordination and decreased motor planning, decreased midline orientation, decreased attention to right, right side neglect and decreased motor planning, decreased initiation, decreased attention, decreased awareness, decreased problem solving, decreased safety awareness, decreased memory and delayed processing and decreased sitting balance, decreased standing balance, decreased postural control and decreased balance strategies and therefore will continue to benefit from skilled PT intervention to increase functional independence with mobility.  Patient progressing toward long term goals..  Continue plan of care.  PT Short Term Goals Week 1:  PT Short Term Goal 1 (Week 1): Pt will transition to sitting EOB with +1 assist PT Short Term Goal 1 - Progress (Week 1): Met PT Short Term Goal 2 (Week 1): Pt will transfer to w/c with +1 assist and LRAD PT Short Term Goal 2 - Progress (Week 1): Met PT Short Term Goal 3 (Week 1): Pt will initiate gait training with PT PT Short Term Goal 3 - Progress (Week 1): Not met Week 2:  PT Short Term Goal 1 (Week 2): Pt will self propel w/c x25' with mod assist PT Short Term Goal 2 (Week 2): Pt will transfer with mod assist +1 PT Short Term Goal 3 (Week  2): Pt will initiate gait training with PT.    Therapy Documentation Precautions:  Precautions Precautions: Fall Precaution Comments: R side inattention, strong push to R side, posterior lean Restrictions Weight Bearing Restrictions: No   See Function Navigator for Current Functional Status.  Therapy/Group: Individual Therapy  Earnest Conroy Penven-Crew 10/14/2016, 4:04 PM

## 2016-10-14 NOTE — Progress Notes (Signed)
Speech Language Pathology Daily Session Note  Patient Details  Name: Douglas [name] MRN: [id number] Date of Birth: 12/07/1940  Today's Date: 10/14/2016 SLP Individual Time: 0815-0912 SLP Individual Time Calculation (min): 57 min   Short Term Goals: Week 2: SLP Short Term Goal 1 (Week 2): Pt will answer yes/no questions for 75% accuracy with max assist verbal cues.   SLP Short Term Goal 2 (Week 2): Pt will follow 1 step commands for 75% accuracy with max assist verbal cues.   SLP Short Term Goal 3 (Week 2): Pt will consume dys 1 textures and honey thick liquids via teaspoon with mod assist for use of swallowing precautions and minimal overt s/s of aspiration.   SLP Short Term Goal 4 (Week 2): Pt will verbalize at the word level during structured tasks for 50% accuracy with max assist multimodal cues. SLP Short Term Goal 5 (Week 2): Pt will utilize multimodal means of communication to convey needs and wants to caregivers with mod assist cues.    Skilled Therapeutic Interventions: Upon arrival, patient had just completed breakafst. Skilled treatment session focused on functional communication . Patient required Max A multimodal cues to identify functional items from a field of 2 with 40% accuracy and was unable to verbalize at the word level despite Max A multimodal cues. Patient also unable to verbalize automatic sequences like counting despite use of manipulatives to maximize function with task. Patient followed 1 step commands with extra time and Max A multimodal cues with basic calendar making task. Function impacted by decreased function of his RUE with decreased awareness to utilize his LUE despite cues. Despite difficulty with tasks, patient demonstrated appropriate alertness, initiation and attention to all tasks. Patient left upright in bed with RN present. Continue with current plan of care.   Function:  Cognition Comprehension Comprehension assist level: Understands basic 25 - 49% of  the time/ requires cueing 50 - 75% of the time  Expression   Expression assist level: Expresses basic 25 - 49% of the time/requires cueing 50 - 75% of the time. Uses single words/gestures.  Social Interaction Social Interaction assist level: Interacts appropriately 50 - 74% of the time - May be physically or verbally inappropriate.  Problem Solving Problem solving assist level: Solves basic 25 - 49% of the time - needs direction more than half the time to initiate, plan or complete simple activities  Memory Memory assist level: Recognizes or recalls less than 25% of the time/requires cueing greater than 75% of the time    Pain No indications of pain  Therapy/Group: Individual Therapy  Kennedy Bohanon 10/14/2016, 12:38 PM

## 2016-10-14 NOTE — Progress Notes (Signed)
Subjective/Complaints: Lying in bed. No distress  Unable to obtain ROS due to aphasia   Objective: Vital Signs: Blood pressure (!) 142/50, pulse 99, temperature 97.7 F (36.5 C), temperature source Tympanic, resp. rate 18, weight 72.3 kg (159 lb 6.3 oz), SpO2 94 %. Dg Swallowing Func-speech Pathology  Result Date: 10/13/2016 Objective Swallowing Evaluation: Type of Study: Bedside Swallow Evaluation Patient Details Name: Douglas [name] MRN: [id number] Date of Birth: [birth year] Today's Date: 10/13/2016 Time: SLP Start Time : 0905 SLP Stop Time: 0935 SLP Time Calculation (min) (ACUTE ONLY): 30 min Past Medical History: Past Medical History: Diagnosis Date . CKD (chronic kidney disease)  . DM (diabetes mellitus) ([place])  . Hyperlipidemia  . Hypertension  Past Surgical History: No past surgical history on file. HPI: 75 y.o. male with medical history significant of HTN, [name], DM, [name] presenting with a stroke. Pt with noted R sided weakness and expressive aphasia. 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 Assessment / Plan / Recommendation CHL IP CLINICAL IMPRESSIONS 10/13/2016 Therapy Diagnosis Moderate oral phase dysphagia;Moderate pharyngeal phase dysphagia Clinical Impression Pt presents with a moderately severe multifactorial dysphagia.  Oral phase deficits are characterized by right sided weakness impacting containment and transit of boluses which lead to premature spillage of materials into the oropharynx.  This in combination with decreased pharyngeal sensation, base of tongue weakness, lethargy, and poor attention to boluses in the setting of severe cognitive impairment results in a significantly delayed response with severe pooling in the pyriforms prior to initiation of swallow.  Delay in swallow initiation resulted in x1 episode of deep, silent penetration with nectar thick liquids, although SLP suspects this to be happening more frequently during meals given  the severity of pt's deficits and functional impairments noted at bedside.  Pt appeared to have better control and cohesion of honey thick liquids via teaspoon without episodes of aspiration or penetration on study.  Suspect careful control of bolus size and increased sensory input from heavier boluses to be effective techniques for maximizing airway protection during the swallow.  As a result, recommend downgrading pt's diet to honey thick liquids via teaspoon with ongoing dys 1 textures and full supervision for strict adherence to swallowing precautions.  Prognosis for advancement good with ongoing ST interventions for management of safe diet progression and as pt's alertness improves.   Impact on safety and function Moderate aspiration risk     Prognosis 10/13/2016 Prognosis for Safe Diet Advancement Good Barriers to Reach Goals Cognitive deficits;Language deficits Barriers/Prognosis Comment -- CHL IP DIET RECOMMENDATION 10/13/2016 SLP Diet Recommendations Dysphagia 1 (Puree) solids;Honey thick liquids Liquid Administration via Spoon Medication Administration Crushed with puree Compensations Slow rate;Small sips/bites;Clear throat intermittently;Lingual sweep for clearance of pocketing Postural Changes Seated upright at 90 degrees            CHL IP ORAL PHASE 10/13/2016 Oral Phase Impaired Oral - Pudding Teaspoon -- Oral - Pudding Cup -- Oral - Honey Teaspoon Piecemeal swallowing;Lingual/palatal residue;Right anterior bolus loss;Weak lingual manipulation;Reduced posterior propulsion;Delayed oral transit;Premature spillage;Decreased bolus cohesion Oral - Honey Cup Right anterior bolus loss;Weak lingual manipulation;Reduced posterior propulsion;Piecemeal swallowing;Decreased bolus cohesion;Premature spillage;Delayed oral transit;Lingual/palatal residue Oral - Nectar Teaspoon Right anterior bolus loss;Weak lingual manipulation;Reduced posterior propulsion;Lingual/palatal residue;Piecemeal swallowing;Decreased bolus  cohesion;Premature spillage Oral - Nectar Cup Right anterior bolus loss;Weak lingual manipulation;Reduced posterior propulsion;Lingual/palatal residue;Decreased bolus cohesion;Premature spillage Oral - Nectar Straw -- Oral - Thin Teaspoon -- Oral - Thin Cup -- Oral - Thin Straw -- Oral -  Puree Lingual/palatal residue;Delayed oral transit Oral - Mech Soft -- Oral - Regular -- Oral - Multi-Consistency -- Oral - Pill -- Oral Phase - Comment --  CHL IP PHARYNGEAL PHASE 10/13/2016 Pharyngeal Phase Impaired Pharyngeal- Pudding Teaspoon -- Pharyngeal -- Pharyngeal- Pudding Cup -- Pharyngeal -- Pharyngeal- Honey Teaspoon Delayed swallow initiation-vallecula;Delayed swallow initiation-pyriform sinuses;Reduced anterior laryngeal mobility;Reduced laryngeal elevation;Reduced tongue base retraction;Pharyngeal residue - valleculae;Pharyngeal residue - pyriform;Pharyngeal residue - posterior pharnyx;Lateral channel residue Pharyngeal -- Pharyngeal- Honey Cup Delayed swallow initiation-pyriform sinuses;Reduced anterior laryngeal mobility;Reduced laryngeal elevation;Reduced tongue base retraction;Pharyngeal residue - valleculae;Pharyngeal residue - pyriform;Pharyngeal residue - posterior pharnyx;Lateral channel residue Pharyngeal -- Pharyngeal- Nectar Teaspoon Delayed swallow initiation-pyriform sinuses;Reduced anterior laryngeal mobility;Reduced laryngeal elevation;Reduced tongue base retraction;Pharyngeal residue - valleculae;Pharyngeal residue - pyriform;Pharyngeal residue - posterior pharnyx;Lateral channel residue Pharyngeal -- Pharyngeal- Nectar Cup Delayed swallow initiation-pyriform sinuses;Reduced anterior laryngeal mobility;Reduced laryngeal elevation;Reduced tongue base retraction;Pharyngeal residue - valleculae;Pharyngeal residue - pyriform;Pharyngeal residue - posterior pharnyx;Lateral channel residue;Penetration/Aspiration during swallow Pharyngeal Material enters airway, CONTACTS cords and not ejected out Pharyngeal-  Nectar Straw -- Pharyngeal -- Pharyngeal- Thin Teaspoon -- Pharyngeal -- Pharyngeal- Thin Cup -- Pharyngeal -- Pharyngeal- Thin Straw -- Pharyngeal -- Pharyngeal- Puree Delayed swallow initiation-vallecula;Reduced anterior laryngeal mobility;Reduced laryngeal elevation;Reduced tongue base retraction;Pharyngeal residue - valleculae;Pharyngeal residue - pyriform;Pharyngeal residue - posterior pharnyx Pharyngeal -- Pharyngeal- Mechanical Soft -- Pharyngeal -- Pharyngeal- Regular -- Pharyngeal -- Pharyngeal- Multi-consistency -- Pharyngeal -- Pharyngeal- Pill -- Pharyngeal -- Pharyngeal Comment --  No flowsheet data found. No flowsheet data found. Page, Selinda Orion 10/13/2016, 12:20 PM              Results for orders placed or performed during the hospital encounter of 10/05/16 (from the past 72 hour(s))  CBC     Status: Abnormal   Collection Time: 10/11/16 11:21 AM  Result Value Ref Range   WBC 21.6 (H) 4.0 - 10.5 K/uL   RBC 2.63 (L) 4.22 - 5.81 MIL/uL   Hemoglobin 6.3 (LL) 13.0 - 17.0 g/dL    Comment: REPEATED TO VERIFY CRITICAL VALUE NOTED.  VALUE IS CONSISTENT WITH PREVIOUSLY REPORTED AND CALLED VALUE.    HCT 21.8 (L) 39.0 - 52.0 %   MCV 82.9 78.0 - 100.0 fL   MCH 24.0 (L) 26.0 - 34.0 pg   MCHC 28.9 (L) 30.0 - 36.0 g/dL   RDW 22.4 (H) 11.5 - 15.5 %   Platelets 42 (L) 150 - 400 K/uL    Comment: REPEATED TO VERIFY CONSISTENT WITH PREVIOUS RESULT   Reticulocytes     Status: Abnormal   Collection Time: 10/11/16 11:21 AM  Result Value Ref Range   Retic Ct Pct 9.5 (H) 0.4 - 3.1 %   RBC. 2.69 (L) 4.22 - 5.81 MIL/uL   Retic Count, Manual 255.6 (H) 19.0 - 186.0 K/uL  Save smear     Status: None   Collection Time: 10/11/16 11:21 AM  Result Value Ref Range   Smear Review SMEAR STAINED AND AVAILABLE FOR REVIEW   Glucose, capillary     Status: Abnormal   Collection Time: 10/11/16 11:54 AM  Result Value Ref Range   Glucose-Capillary 184 (H) 65 - 99 mg/dL  Prepare RBC     Status: None   Collection  Time: 10/11/16  3:37 PM  Result Value Ref Range   Order Confirmation ORDER PROCESSED BY BLOOD BANK   Type and screen [place]     Status: None   Collection Time: 10/11/16  3:38 PM  Result Value Ref  Range   ISSUE DATE / TIME [id number]    Blood Product Unit Number [id number]    PRODUCT CODE [id number]    Unit Type and Rh 5100    Blood Product Expiration Date [id number]    ISSUE DATE / TIME [id number]    Blood Product Unit Number [id number]    PRODUCT CODE [id number]    Unit Type and Rh 5100    Blood Product Expiration Date [id number]   ABO/Rh     Status: None   Collection Time: 10/11/16  3:38 PM  Result Value Ref Range   ABO/RH(D) O POS   Glucose, capillary     Status: Abnormal   Collection Time: 10/11/16  4:50 PM  Result Value Ref Range   Glucose-Capillary 187 (H) 65 - 99 mg/dL   Comment 1 Notify RN   CBC     Status: Abnormal   Collection Time: 10/11/16  6:11 PM  Result Value Ref Range   WBC 16.2 (H) 4.0 - 10.5 K/uL   RBC 2.52 (L) 4.22 - 5.81 MIL/uL   Hemoglobin 6.1 (LL) 13.0 - 17.0 g/dL    Comment: REPEATED TO VERIFY CRITICAL VALUE NOTED.  VALUE IS CONSISTENT WITH PREVIOUSLY REPORTED AND CALLED VALUE.    HCT 20.4 (L) 39.0 - 52.0 %   MCV 81.0 78.0 - 100.0 fL   MCH 24.2 (L) 26.0 - 34.0 pg   MCHC 29.9 (L) 30.0 - 36.0 g/dL   RDW 22.3 (H) 11.5 - 15.5 %   Platelets 44 (L) 150 - 400 K/uL    Comment: REPEATED TO VERIFY CONSISTENT WITH PREVIOUS RESULT   DIC (disseminated intravasc coag) panel     Status: Abnormal   Collection Time: 10/11/16  6:11 PM  Result Value Ref Range   Prothrombin Time 16.3 (H) 11.4 - 15.2 seconds   INR 1.30    aPTT 33 24 - 36 seconds   Fibrinogen 248 210 - 475 mg/dL   D-Dimer, Quant >20.00 (H) 0.00 - 0.50 ug/mL-FEU    Comment: REPEATED TO VERIFY (NOTE) At the manufacturer cut-off of 0.50 ug/mL FEU, this assay has been documented to exclude PE with a sensitivity and negative predictive value of 97 to 99%.  At this  time, this assay has not been approved by the FDA to exclude DVT/VTE. Results should be correlated with clinical presentation.    Platelets 44 (L) 150 - 400 K/uL    Comment: REPEATED TO VERIFY CONSISTENT WITH PREVIOUS RESULT    Smear Review SCHISTOCYTES NOTED ON SMEAR   Lactate dehydrogenase     Status: Abnormal   Collection Time: 10/11/16  6:11 PM  Result Value Ref Range   LDH 297 (H) 98 - 192 U/L  Haptoglobin     Status: Abnormal   Collection Time: 10/11/16  6:11 PM  Result Value Ref Range   Haptoglobin 203 (H) 34 - 200 mg/dL    Comment: (NOTE) Performed At: [facility] [place], [place] [id number] Lindon Romp MD [id number]   Ferritin     Status: None   Collection Time: 10/11/16  6:11 PM  Result Value Ref Range   Ferritin 36 24 - 336 ng/mL  Iron and TIBC     Status: Abnormal   Collection Time: 10/11/16  6:11 PM  Result Value Ref Range   Iron 23 (L) 45 - 182 ug/dL   TIBC 396 250 - 450 ug/dL   Saturation Ratios 6 (L) 17.9 - 39.5 %   UIBC 373 ug/dL  CBC     Status: Abnormal   Collection Time: 10/11/16  8:34 PM  Result Value Ref Range   WBC 16.6 (H) 4.0 - 10.5 K/uL   RBC 2.44 (L) 4.22 - 5.81 MIL/uL   Hemoglobin 5.9 (LL) 13.0 - 17.0 g/dL    Comment: REPEATED TO VERIFY CRITICAL VALUE NOTED.  VALUE IS CONSISTENT WITH PREVIOUSLY REPORTED AND CALLED VALUE.    HCT 19.7 (L) 39.0 - 52.0 %   MCV 80.7 78.0 - 100.0 fL   MCH 24.2 (L) 26.0 - 34.0 pg   MCHC 29.9 (L) 30.0 - 36.0 g/dL   RDW 22.4 (H) 11.5 - 15.5 %   Platelets 38 (L) 150 - 400 K/uL    Comment: REPEATED TO VERIFY PLATELET COUNT CONFIRMED BY SMEAR   Glucose, capillary     Status: Abnormal   Collection Time: 10/11/16  9:05 PM  Result Value Ref Range   Glucose-Capillary 141 (H) 65 - 99 mg/dL   Comment 1 Notify RN   CBC     Status: Abnormal   Collection Time: 10/12/16  2:28 AM  Result Value Ref Range   WBC 16.7 (H) 4.0 - 10.5 K/uL   RBC 2.55 (L) 4.22 - 5.81 MIL/uL   Hemoglobin 6.1  (LL) 13.0 - 17.0 g/dL    Comment: REPEATED TO VERIFY SPECIMEN CHECKED FOR CLOTS CRITICAL VALUE NOTED.  VALUE IS CONSISTENT WITH PREVIOUSLY REPORTED AND CALLED VALUE.    HCT 21.3 (L) 39.0 - 52.0 %   MCV 83.5 78.0 - 100.0 fL   MCH 23.9 (L) 26.0 - 34.0 pg   MCHC 28.6 (L) 30.0 - 36.0 g/dL   RDW 22.9 (H) 11.5 - 15.5 %   Platelets 47 (L) 150 - 400 K/uL    Comment: REPEATED TO VERIFY SPECIMEN CHECKED FOR CLOTS CONSISTENT WITH PREVIOUS RESULT   Creatinine, serum     Status: Abnormal   Collection Time: 10/12/16  2:28 AM  Result Value Ref Range   Creatinine, Ser 1.44 (H) 0.61 - 1.24 mg/dL   GFR calc non Af Amer 46 (L) >60 mL/min   GFR calc Af Amer 53 (L) >60 mL/min    Comment: (NOTE) The eGFR has been calculated using the CKD EPI equation. This calculation has not been validated in all clinical situations. eGFR's persistently <60 mL/min signify possible Chronic Kidney Disease.   ADAMTS13 Activity     Status: Abnormal   Collection Time: 10/12/16  2:28 AM  Result Value Ref Range   Adamts 13 Activity 36.8 (L) >66.8 %    Comment: (NOTE) This test was developed and its performance characteristics determined by [facility]. It has not been cleared or approved by the Food and Drug Administration. Performed At: [facility] [place], [place] [id number] Lindon Romp MD [id number]   Direct antiglobulin test     Status: None   Collection Time: 10/12/16  2:28 AM  Result Value Ref Range   DAT, complement NEG    DAT, IgG NEG   ADAMTS13 Activity reflex     Status: None   Collection Time: 10/12/16  2:28 AM  Result Value Ref Range   ADAMTS13 Activity comment Comment     Comment: (NOTE) Severe deficiency of ADAMTS13 (less than 10% activity) is a relatively specific finding in patients with a clinical diagnosis of either hereditary or acquired thrombotic thrombocytopenic purpura (TTP). Normal to moderately reduced ADAMTS13 activity results do not exclude a diagnosis  of TTP. Conditions that could have ADAMTS13 activity greater  than 10% include hemolytic uremic syndrome (HUS), atypical hemolytic uremic syndrome (aHUS), and other thrombotic microangiopathies associated with hematopoietic stem cell and solid organ transplantation, liver disease, DIC, sepsis, pregnancy, or effects of certain medications (eg, clopidogrel, cyclosporine, mitomycin C, quinine). Performed At: [facility] [place], [place] [id number] Lindon Romp MD [id number]   Glucose, capillary     Status: Abnormal   Collection Time: 10/12/16  4:44 AM  Result Value Ref Range   Glucose-Capillary 204 (H) 65 - 99 mg/dL   Comment 1 Notify RN   Glucose, capillary     Status: Abnormal   Collection Time: 10/12/16  6:52 AM  Result Value Ref Range   Glucose-Capillary 219 (H) 65 - 99 mg/dL   Comment 1 Notify RN   Prepare RBC     Status: None   Collection Time: 10/12/16  8:06 AM  Result Value Ref Range   Order Confirmation ORDER PROCESSED BY BLOOD BANK   CBC     Status: Abnormal   Collection Time: 10/12/16  8:47 AM  Result Value Ref Range   WBC 16.9 (H) 4.0 - 10.5 K/uL   RBC 2.41 (L) 4.22 - 5.81 MIL/uL   Hemoglobin 5.9 (LL) 13.0 - 17.0 g/dL    Comment: REPEATED TO VERIFY CRITICAL VALUE NOTED.  VALUE IS CONSISTENT WITH PREVIOUSLY REPORTED AND CALLED VALUE.    HCT 19.6 (L) 39.0 - 52.0 %   MCV 81.3 78.0 - 100.0 fL   MCH 24.5 (L) 26.0 - 34.0 pg   MCHC 30.1 30.0 - 36.0 g/dL   RDW 22.6 (H) 11.5 - 15.5 %   Platelets 49 (L) 150 - 400 K/uL    Comment: REPEATED TO VERIFY CONSISTENT WITH PREVIOUS RESULT   Glucose, capillary     Status: Abnormal   Collection Time: 10/12/16 11:57 AM  Result Value Ref Range   Glucose-Capillary 257 (H) 65 - 99 mg/dL   Comment 1 Notify RN   Glucose, capillary     Status: Abnormal   Collection Time: 10/12/16  4:53 PM  Result Value Ref Range   Glucose-Capillary 234 (H) 65 - 99 mg/dL   Comment 1 Notify RN   CBC     Status: Abnormal    Collection Time: 10/12/16  8:45 PM  Result Value Ref Range   WBC 15.4 (H) 4.0 - 10.5 K/uL   RBC 3.62 (L) 4.22 - 5.81 MIL/uL   Hemoglobin 9.2 (L) 13.0 - 17.0 g/dL    Comment: POST TRANSFUSION SPECIMEN   HCT 29.2 (L) 39.0 - 52.0 %   MCV 80.7 78.0 - 100.0 fL   MCH 25.4 (L) 26.0 - 34.0 pg   MCHC 31.5 30.0 - 36.0 g/dL   RDW 19.7 (H) 11.5 - 15.5 %   Platelets 54 (L) 150 - 400 K/uL    Comment: REPEATED TO VERIFY CONSISTENT WITH PREVIOUS RESULT   Glucose, capillary     Status: Abnormal   Collection Time: 10/12/16  9:39 PM  Result Value Ref Range   Glucose-Capillary 115 (H) 65 - 99 mg/dL   Comment 1 Notify RN   Vitamin B12     Status: Abnormal   Collection Time: 10/13/16  3:11 AM  Result Value Ref Range   Vitamin B-12 4,320 (H) 180 - 914 pg/mL    Comment: (NOTE) This assay is not validated for testing neonatal or myeloproliferative syndrome specimens for Vitamin B12 levels.   Hepatic function panel     Status: Abnormal   Collection Time:  10/13/16  3:11 AM  Result Value Ref Range   Total Protein 5.8 (L) 6.5 - 8.1 g/dL   Albumin 1.9 (L) 3.5 - 5.0 g/dL   AST 50 (H) 15 - 41 U/L   ALT 38 17 - 63 U/L   Alkaline Phosphatase 197 (H) 38 - 126 U/L   Total Bilirubin 2.2 (H) 0.3 - 1.2 mg/dL   Bilirubin, Direct 0.5 0.1 - 0.5 mg/dL   Indirect Bilirubin 1.7 (H) 0.3 - 0.9 mg/dL  CBC     Status: Abnormal   Collection Time: 10/13/16  5:32 AM  Result Value Ref Range   WBC 15.4 (H) 4.0 - 10.5 K/uL   RBC 3.37 (L) 4.22 - 5.81 MIL/uL   Hemoglobin 8.6 (L) 13.0 - 17.0 g/dL   HCT 27.1 (L) 39.0 - 52.0 %   MCV 80.4 78.0 - 100.0 fL   MCH 25.5 (L) 26.0 - 34.0 pg   MCHC 31.7 30.0 - 36.0 g/dL   RDW 20.6 (H) 11.5 - 15.5 %   Platelets 69 (L) 150 - 400 K/uL    Comment: PLATELET COUNT CONFIRMED BY SMEAR  Glucose, capillary     Status: Abnormal   Collection Time: 10/13/16  6:46 AM  Result Value Ref Range   Glucose-Capillary 153 (H) 65 - 99 mg/dL   Comment 1 Notify RN   Glucose, capillary     Status:  Abnormal   Collection Time: 10/13/16 11:41 AM  Result Value Ref Range   Glucose-Capillary 350 (H) 65 - 99 mg/dL  CBC     Status: Abnormal   Collection Time: 10/13/16 12:37 PM  Result Value Ref Range   WBC 17.3 (H) 4.0 - 10.5 K/uL   RBC 3.57 (L) 4.22 - 5.81 MIL/uL   Hemoglobin 9.1 (L) 13.0 - 17.0 g/dL   HCT 29.1 (L) 39.0 - 52.0 %   MCV 81.5 78.0 - 100.0 fL   MCH 25.5 (L) 26.0 - 34.0 pg   MCHC 31.3 30.0 - 36.0 g/dL   RDW 20.8 (H) 11.5 - 15.5 %   Platelets 71 (L) 150 - 400 K/uL    Comment: REPEATED TO VERIFY CONSISTENT WITH PREVIOUS RESULT   CBC     Status: Abnormal   Collection Time: 10/13/16  3:53 PM  Result Value Ref Range   WBC 16.5 (H) 4.0 - 10.5 K/uL   RBC 3.23 (L) 4.22 - 5.81 MIL/uL   Hemoglobin 8.2 (L) 13.0 - 17.0 g/dL   HCT 26.4 (L) 39.0 - 52.0 %   MCV 81.7 78.0 - 100.0 fL   MCH 25.4 (L) 26.0 - 34.0 pg   MCHC 31.1 30.0 - 36.0 g/dL   RDW 20.8 (H) 11.5 - 15.5 %   Platelets 73 (L) 150 - 400 K/uL    Comment: CONSISTENT WITH PREVIOUS RESULT  Glucose, capillary     Status: Abnormal   Collection Time: 10/13/16  4:31 PM  Result Value Ref Range   Glucose-Capillary 275 (H) 65 - 99 mg/dL  CBC     Status: Abnormal   Collection Time: 10/13/16  8:22 PM  Result Value Ref Range   WBC 16.1 (H) 4.0 - 10.5 K/uL   RBC 3.30 (L) 4.22 - 5.81 MIL/uL   Hemoglobin 8.4 (L) 13.0 - 17.0 g/dL   HCT 26.9 (L) 39.0 - 52.0 %   MCV 81.5 78.0 - 100.0 fL   MCH 25.5 (L) 26.0 - 34.0 pg   MCHC 31.2 30.0 - 36.0 g/dL   RDW 20.9 (H) 11.5 -  15.5 %   Platelets 75 (L) 150 - 400 K/uL    Comment: CONSISTENT WITH PREVIOUS RESULT  Occult blood card to lab, stool     Status: Abnormal   Collection Time: 10/14/16 12:09 AM  Result Value Ref Range   Fecal Occult Bld POSITIVE (A) NEGATIVE  CBC     Status: Abnormal   Collection Time: 10/14/16  3:42 AM  Result Value Ref Range   WBC 16.7 (H) 4.0 - 10.5 K/uL   RBC 3.37 (L) 4.22 - 5.81 MIL/uL   Hemoglobin 8.4 (L) 13.0 - 17.0 g/dL   HCT 27.4 (L) 39.0 - 52.0 %    MCV 81.3 78.0 - 100.0 fL   MCH 24.9 (L) 26.0 - 34.0 pg   MCHC 30.7 30.0 - 36.0 g/dL   RDW 21.2 (H) 11.5 - 15.5 %   Platelets 89 (L) 150 - 400 K/uL    Comment: CONSISTENT WITH PREVIOUS RESULT  Basic metabolic panel     Status: Abnormal   Collection Time: 10/14/16  3:42 AM  Result Value Ref Range   Sodium 139 135 - 145 mmol/L   Potassium 3.4 (L) 3.5 - 5.1 mmol/L   Chloride 106 101 - 111 mmol/L   CO2 23 22 - 32 mmol/L   Glucose, Bld 112 (H) 65 - 99 mg/dL   BUN 16 6 - 20 mg/dL   Creatinine, Ser 1.32 (H) 0.61 - 1.24 mg/dL   Calcium 8.4 (L) 8.9 - 10.3 mg/dL   GFR calc non Af Amer 51 (L) >60 mL/min   GFR calc Af Amer 59 (L) >60 mL/min    Comment: (NOTE) The eGFR has been calculated using the CKD EPI equation. This calculation has not been validated in all clinical situations. eGFR's persistently <60 mL/min signify possible Chronic Kidney Disease.    Anion gap 10 5 - 15  Glucose, capillary     Status: Abnormal   Collection Time: 10/14/16  6:57 AM  Result Value Ref Range   Glucose-Capillary 124 (H) 65 - 99 mg/dL      General: NAD. Vital signs reviewed.  Psych: Unable to assess due to aphasia Heart: Regular rate and rhythm. No JVD. Lungs: Unlabored. Clear. Abdomen: Positive bowel sounds, soft  Skin: Erythema on dorsum of left foot.    Neurologic: Alert Appears to have more exp than rec aphasia---garbled words Inconsistently following commands, moving b/l UE and LLE.  Follows some simple commands Unable to assess MMT and sensation due to aphasia Musculoskeletal: No edema. No tenderness.  Assessment/Plan: 1. 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 Physiatrist is providing close team supervision and 24 hour management of active medical problems listed below. Physiatrist and rehab team continue to assess barriers to  discharge/monitor patient progress toward functional and medical goals. FIM: Function - Bathing Position: Bed Body parts bathed by patient: Right arm, Chest, Abdomen, Front perineal area, Right upper leg, Left upper leg Body parts bathed by helper: Left arm, Buttocks, Right lower leg, Left lower leg, Back Bathing not applicable: Right lower leg, Left lower leg Assist Level:  (Moderate assist)  Function- Upper Body Dressing/Undressing What is the patient wearing?: Pull over shirt/dress Pull over shirt/dress - Perfomed by patient: Thread/unthread right sleeve, Thread/unthread left sleeve, Put head through opening, Pull shirt over trunk Pull over shirt/dress - Perfomed by helper: Put head through opening, Pull shirt over trunk Assist  Level: Touching or steadying assistance(Pt > 75%) Function - Lower Body Dressing/Undressing What is the patient wearing?: Pants, Non-skid slipper socks, Ted Hose Position: Bed Pants- Performed by patient: Pull pants up/down Pants- Performed by helper: Thread/unthread right pants leg, Thread/unthread left pants leg, Fasten/unfasten pants Non-skid slipper socks- Performed by patient: Don/doff right sock, Don/doff left sock Non-skid slipper socks- Performed by helper: Don/doff right sock, Don/doff left sock TED Hose - Performed by helper: Don/doff right TED hose, Don/doff left TED hose Assist for footwear: Dependant Assist for lower body dressing: 2 Helpers  Function - Toileting Toileting activity did not occur: No continent bowel/bladder event Toileting steps completed by helper: Adjust clothing prior to toileting, Performs perineal hygiene, Adjust clothing after toileting Assist level:  (total)  Function - Air cabin crew transfer activity did not occur: N/A Toilet transfer assistive device: Bedside commode Assist level to toilet: 2 helpers Assist level from toilet: 2 helpers Assist level to bedside commode (at bedside): 2 helpers Assist level  from bedside commode (at bedside): 2 helpers  Function - Chair/bed transfer Chair/bed transfer activity did not occur: Safety/medical concerns Chair/bed transfer method: Squat pivot Chair/bed transfer assist level: Total assist (Pt < 25%) Chair/bed transfer assistive device: Armrests Chair/bed transfer details: Manual facilitation for weight shifting, Tactile cues for posture, Verbal cues for sequencing, Verbal cues for technique  Function - Locomotion: Wheelchair Will patient use wheelchair at discharge?: Yes Type: Manual Wheelchair activity did not occur: Safety/medical concerns Assist Level: Total assistance (Pt < 25%) Wheel 50 feet with 2 turns activity did not occur: Safety/medical concerns Wheel 150 feet activity did not occur: Safety/medical concerns Function - Locomotion: Ambulation Ambulation activity did not occur: Safety/medical concerns Walk 10 feet activity did not occur: Safety/medical concerns Walk 50 feet with 2 turns activity did not occur: Safety/medical concerns Walk 150 feet activity did not occur: Safety/medical concerns Walk 10 feet on uneven surfaces activity did not occur: Safety/medical concerns  Function - Comprehension Comprehension: Auditory Comprehension assist level: Understands basic 50 - 74% of the time/ requires cueing 25 - 49% of the time  Function - Expression Expression: Nonverbal Expression assist level: Expresses basic 25 - 49% of the time/requires cueing 50 - 75% of the time. Uses single words/gestures.  Function - Social Interaction Social Interaction assist level: Interacts appropriately 50 - 74% of the time - May be physically or verbally inappropriate.  Function - Problem Solving Problem solving assist level: Solves basic 25 - 49% of the time - needs direction more than half the time to initiate, plan or complete simple activities  Function - Memory Memory assist level: Recognizes or recalls less than 25% of the time/requires cueing  greater than 75% of the time Patient normally able to recall (first 3 days only): That he or she is in a hospital   Medical Problem List and Plan: 1.  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  DVT Prophylaxis/Anticoagulation: Subcutaneous Lovenox, holding.  Monitor platelet counts and any signs of bleeding  SCDs ordered 3. Pain Management: Tylenol as needed 4. Dysphagia. Dysphagia #1 nectar liquids. Monitor for any signs of aspiration.   Added daily at bedtime IV fluids 5. Neuropsych: This patient is capable of making decisions on his own behalf. 6. Skin/Wound Care: Routine skin checks 7. Fluids/Electrolytes/Nutrition: Routine I&Os 8. Aspiration pneumonia. Augmentin 8 doses initiated 10/02/2016. 9. Diabetes mellitus with peripheral neuropathy. Hemoglobin A1c  7.2.   Check blood sugars before meals and at bedtime  Currently Lantus insulin 16 units daily .  Novolog 4U with meals added 12/25, increased to 8U on 12/26, increased to 12U on 12/27  CBG's higher in mid day to PM.--increase lantus to 20u q0800. Consider BID dosing as well 10. Hypertension/tachycardia. Lopressor 12.5 mg twice a day. Monitor heart rate with increased mobility  Overall controlled 12/29 11. Stage III chronic kidney disease. Baseline creatinine 1.62. Follow-up chemistries  BMP Latest Ref Rng & Units 10/14/2016 10/12/2016 10/11/2016  Glucose 65 - 99 mg/dL 112(H) - 192(H)  BUN 6 - 20 mg/dL 16 - 23(H)  Creatinine 0.61 - 1.24 mg/dL 1.32(H) 1.44(H) 1.13  Sodium 135 - 145 mmol/L 139 - 137  Potassium 3.5 - 5.1 mmol/L 3.4(L) - 4.0  Chloride 101 - 111 mmol/L 106 - 106  CO2 22 - 32 mmol/L 23 - 24  Calcium 8.9 - 10.3 mg/dL 8.4(L) - 8.3(L)   12. Mood. Depakote 250 mg BID added for bouts of agitation and restlessness. 13. Hypothyroidism. Synthroid 14. Hyperlipidemia. Lipitor 15. Anemia, acute blood  loss.   Severe drop in Hb, Hb 9.1 on 12/29 (transfusion 12/28)   Hemoccult + 12/25  Cont to monitor  GI consulted, spoke to GI with plans for EGD, family refused, spoke with GI again, cont PPI  Heme/Onc consulted - ?DIC vs microscopic bleed, spoke to Heme/Onc, appreciate recs 16.  Leukocytosis: likely from from UTI  Febrile 12/23, afebrile since  CXR reviewed 12/22 with stable infiltrates, cont to monitor  WBCs 17.3 on 12/29 on abx  Foley replaced  UA+, Ucx Citrobacter   Repeat CXR with new opacities, cont Vanc/Zosyn 17. Thrombocytopenia:   Cont to drop  Plts 71 on 12/29, Lovenox held, improving  SRA negative  Will cont to monitor 18. Hypoalbuminemia  Supplement started 12/23 19. Acute lower UTI  Foley to stay in place, difficult placement with tight foreskin.  Will consider Urology consult  Ucx Citrobacter  Cipro started 12/24 for UTI +/- PNA, changed to Vanc/Zosyn 20. Left foot erythema  Possible related to trauma  Pt is already on abx. 21. AKI on CKD  Cr. 1.44 on 12/28  Cont to monitor  Improving  Will cont to monitor  Labs ordered for Monday  -HS IVF  LOS (Days) 9 A FACE TO FACE EVALUATION WAS PERFORMED  SWARTZ,ZACHARY T 10/14/2016, 9:11 AM

## 2016-10-14 NOTE — Progress Notes (Signed)
Pharmacy Antibiotic Note  Douglas [name] is a 75 y.o. male admitted on 10/05/2016 with pneumonia.  Pharmacy has been consulted for vancomycin and zosyn dosing.  SCr 1.44 (CrCl ~45).  Patient is afebrile with a WBC of 15.1k/uL. Serum creatinine and has come down slightly since last reading.   Plan: - Vancomycin 750 mg iv q12h and zosyn 3.375 g iv q8h (4hr infusion) - monitor renal function and clinical progress - f/u de-escalation  - check vancomycin trough tomorrow   Weight: 159 lb 6.3 oz (72.3 kg)  Temp (24hrs), Avg:98 F (36.7 C), Min:97.7 F (36.5 C), Max:98.6 F (37 C)   Recent Labs Lab 10/10/16 0815 10/11/16 0750  10/12/16 0228  10/13/16 1237 10/13/16 1553 10/13/16 2022 10/14/16 0342 10/14/16 0846  WBC  --  18.5*  < > 16.7*  < > 17.3* 16.5* 16.1* 16.7* 15.1*  CREATININE 1.16 1.13  --  1.44*  --   --   --   --  1.32*  --   < > = values in this interval not displayed.  Estimated Creatinine Clearance: 49.4 mL/min (by C-G formula based on SCr of 1.32 mg/dL (H)).    No Known Allergies  Antimicrobials this admission: Ciprofloxacin 12/24 >> 12/27 Vancomycin  12/27 >>  Zosyn 12/27 >>  Dose adjustments this admission: None  Microbiology results: 12/23 UCx: citrobacter sensitive to zosyn    Thank you for allowing pharmacy to be a part of this patient's care.  Carylon PerchesMaggie Shuda, PharmD Acute Care Pharmacy Resident  Pager: [phone] 10/14/2016

## 2016-10-14 NOTE — Progress Notes (Signed)
Occupational Therapy Session Note  Patient Details  Name: Douglas [name] MRN: [id number] Date of Birth: Dec 05, 1940  Today's Date: 10/14/2016  Session 1 OT Individual Time: 0915-1000 OT Individual Time Calculation (min): 45 min   Session 2 OT Individual Time: 1445-1525 OT Individual Time Calculation (min): 40 min     Short Term Goals: Week 2:  OT Short Term Goal 1 (Week 2): Pt will maintain unsupported sitting balance with min assist for 2 mins to engage in self-care task OT Short Term Goal 2 (Week 2): Pt will transfer to toilet with max assist of 1 caregiver OT Short Term Goal 3 (Week 2): Pt will complete sit > stand with max assist of one caregiver to progress towards LB dressing at sit > stand level OT Short Term Goal 4 (Week 2): Pt will complete LB dressing with max assist   Skilled Therapeutic Interventions/Progress Updates:    Session 1 1:1 OT session focused modified bathing/dressing, attention, activity tolerance, R NMR, and transfers. Pt alert and interactive today, following 1-2 step commands for ADL participation. Pt incontinent of stool, requiring total A for peri-care, but followed commands to Roll L<>R w/ Mod A. Pt required assist to thread LE's but initiated hip bridging w/ OT support. Incorporated forced use of R UE to pull pants over hips. Pt then transferred to EOB and worked on sitting balance, + UB dressing task. Min-Mod A sitting balance with increased task demand of UB dressing.  Ideational apraxia w/ deodorant requiring max multimodal cues and total A to use item correctly.  Max A squat-pivot transfer >w/c.Left in care of RN w/ safety belt on and needs met.  Session 2 Pt asleep in w/c upon OT arrival. Removed safety mits and brought patient to sink in w/c for grooming activity. Max multimodal cues and had-over hand assist required to initiate hand-washing task. Pt only washed L hand with OT assist, but spontaneously used B UE's when given paper towel to dry hands.  Addressed ideational apraxia within grooming task. Pt unable to demonstrate correct use of comb, utilized demonstration and hand-over hand assist to engage pt in correct use of object. Total A scooting transfer to L to return to bed. Pt immediately fell asleep after returning to bed and left in side-lying position with bed alarm on.  Therapy Documentation Precautions:  Precautions Precautions: Fall Precaution Comments: R side inattention, strong push to R side, posterior lean Restrictions Weight Bearing Restrictions: No  See Function Navigator for Current Functional Status.   Therapy/Group: Individual Therapy  Valma Cava 10/14/2016, 3:26 PM

## 2016-10-15 ENCOUNTER — Inpatient Hospital Stay (HOSPITAL_COMMUNITY): Payer: Medicare Other | Admitting: *Deleted

## 2016-10-15 LAB — CBC
HEMATOCRIT: 27.6 % — AB (ref 39.0–52.0)
Hemoglobin: 8.4 g/dL — ABNORMAL LOW (ref 13.0–17.0)
MCH: 25.1 pg — ABNORMAL LOW (ref 26.0–34.0)
MCHC: 30.4 g/dL (ref 30.0–36.0)
MCV: 82.6 fL (ref 78.0–100.0)
PLATELETS: 108 10*3/uL — AB (ref 150–400)
RBC: 3.34 MIL/uL — ABNORMAL LOW (ref 4.22–5.81)
RDW: 21.6 % — AB (ref 11.5–15.5)
WBC: 15.3 10*3/uL — AB (ref 4.0–10.5)

## 2016-10-15 LAB — GLUCOSE, CAPILLARY
GLUCOSE-CAPILLARY: 220 mg/dL — AB (ref 65–99)
GLUCOSE-CAPILLARY: 236 mg/dL — AB (ref 65–99)
GLUCOSE-CAPILLARY: 273 mg/dL — AB (ref 65–99)
Glucose-Capillary: 118 mg/dL — ABNORMAL HIGH (ref 65–99)

## 2016-10-15 NOTE — Progress Notes (Signed)
Physical Therapy Session Note  Patient Details  Name: Douglas [name] MRN: [id number] Date of Birth: 11/23/1940  Today's Date: 10/15/2016 PT Individual Time: 1525-1555 PT Individual Time Calculation (min): 30 min    Short Term Goals: Week 2:  PT Short Term Goal 1 (Week 2): Pt will self propel w/c x25' with mod assist PT Short Term Goal 2 (Week 2): Pt will transfer with mod assist +1 PT Short Term Goal 3 (Week 2): Pt will initiate gait training with PT.       Skilled Therapeutic Interventions/Progress Updates:    Tx focused on NMR via functional transfer training, midline orientation, and manual facilitation during static and transitional movements. Pt in bed upon arrival, indicating he was ready to get OOB.  Bridging x5 with assist at RLE for positioning.  Rolling and supine>sit with Mod A for R LE management. Pt able to push to sit with bil UE from fully rolled position.  Static and dynamic sitting balance x5 min with Min-max A with manual faciltation for midline orientation due to strong R push.  [facility] for [facility]. Pt continued to position self in R pushing postures with feet and hands, but was able to lift hips to stand with Mod A in 2 of the trials. Pt stood at mirror with Max A for midline orientation x151min with strong pushing R. Multi-modal cues provided for NMR.  [facility] and all needs in reach, including soft touch bell.    Therapy Documentation Precautions:  Precautions Precautions: Fall Precaution Comments: R side inattention, strong push to R side, posterior lean Restrictions Weight Bearing Restrictions: No General:   Vital Signs: Therapy Vitals Temp: 97.8 F (36.6 C) Temp Source: Oral Pulse Rate: 96 Resp: 18 BP: (!) 144/55 Patient Position (if appropriate): Lying Oxygen Therapy SpO2: 96 % O2 Device: Not Delivered Pain: none     See Function Navigator for Current Functional Status.   Therapy/Group:  Individual Therapy  Elnathan Fulford, Chrisandra NettersOLE M  Ravan Schlemmer, PT, DPT  10/15/2016, 4:04 PM

## 2016-10-15 NOTE — Progress Notes (Signed)
Subjective/Complaints: Up in bed. No new complaints. Decreased arousal reported by  Therapy yesterday  Unable to obtain ROS due to aphasia   Objective: Vital Signs: Blood pressure (!) 142/71, pulse 99, temperature 97.5 F (36.4 C), temperature source Oral, resp. rate 18, weight 72.3 kg (159 lb 6.3 oz), SpO2 94 %. Dg Swallowing Func-speech Pathology  Result Date: 10/13/2016 Objective Swallowing Evaluation: Type of Study: Bedside Swallow Evaluation Patient Details Name: Douglas [name] MRN: [id number] Date of Birth: 07/05/1941 Today's Date: 10/13/2016 Time: SLP Start Time : 0905 SLP Stop Time: 0935 SLP Time Calculation (min) (ACUTE ONLY): 30 min Past Medical History: Past Medical History: Diagnosis Date . CKD (chronic kidney disease)  . DM (diabetes mellitus) ([place])  . Hyperlipidemia  . Hypertension  Past Surgical History: No past surgical history on file. HPI: 75 y.o. male with medical history significant of HTN, [name], DM, [name] presenting with a stroke. Pt with noted R sided weakness and expressive aphasia. 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 Assessment / Plan / Recommendation CHL IP CLINICAL IMPRESSIONS 10/13/2016 Therapy Diagnosis Moderate oral phase dysphagia;Moderate pharyngeal phase dysphagia Clinical Impression Pt presents with a moderately severe multifactorial dysphagia.  Oral phase deficits are characterized by right sided weakness impacting containment and transit of boluses which lead to premature spillage of materials into the oropharynx.  This in combination with decreased pharyngeal sensation, base of tongue weakness, lethargy, and poor attention to boluses in the setting of severe cognitive impairment results in a significantly delayed response with severe pooling in the pyriforms prior to initiation of swallow.  Delay in swallow initiation resulted in x1 episode of deep, silent penetration with nectar thick liquids, although SLP suspects this to  be happening more frequently during meals given the severity of pt's deficits and functional impairments noted at bedside.  Pt appeared to have better control and cohesion of honey thick liquids via teaspoon without episodes of aspiration or penetration on study.  Suspect careful control of bolus size and increased sensory input from heavier boluses to be effective techniques for maximizing airway protection during the swallow.  As a result, recommend downgrading pt's diet to honey thick liquids via teaspoon with ongoing dys 1 textures and full supervision for strict adherence to swallowing precautions.  Prognosis for advancement good with ongoing ST interventions for management of safe diet progression and as pt's alertness improves.   Impact on safety and function Moderate aspiration risk     Prognosis 10/13/2016 Prognosis for Safe Diet Advancement Good Barriers to Reach Goals Cognitive deficits;Language deficits Barriers/Prognosis Comment -- CHL IP DIET RECOMMENDATION 10/13/2016 SLP Diet Recommendations Dysphagia 1 (Puree) solids;Honey thick liquids Liquid Administration via Spoon Medication Administration Crushed with puree Compensations Slow rate;Small sips/bites;Clear throat intermittently;Lingual sweep for clearance of pocketing Postural Changes Seated upright at 90 degrees            CHL IP ORAL PHASE 10/13/2016 Oral Phase Impaired Oral - Pudding Teaspoon -- Oral - Pudding Cup -- Oral - Honey Teaspoon Piecemeal swallowing;Lingual/palatal residue;Right anterior bolus loss;Weak lingual manipulation;Reduced posterior propulsion;Delayed oral transit;Premature spillage;Decreased bolus cohesion Oral - Honey Cup Right anterior bolus loss;Weak lingual manipulation;Reduced posterior propulsion;Piecemeal swallowing;Decreased bolus cohesion;Premature spillage;Delayed oral transit;Lingual/palatal residue Oral - Nectar Teaspoon Right anterior bolus loss;Weak lingual manipulation;Reduced posterior  propulsion;Lingual/palatal residue;Piecemeal swallowing;Decreased bolus cohesion;Premature spillage Oral - Nectar Cup Right anterior bolus loss;Weak lingual manipulation;Reduced posterior propulsion;Lingual/palatal residue;Decreased bolus cohesion;Premature spillage Oral - Nectar Straw -- Oral - Thin Teaspoon -- Oral - Thin Cup --  Oral - Thin Straw -- Oral - Puree Lingual/palatal residue;Delayed oral transit Oral - Mech Soft -- Oral - Regular -- Oral - Multi-Consistency -- Oral - Pill -- Oral Phase - Comment --  CHL IP PHARYNGEAL PHASE 10/13/2016 Pharyngeal Phase Impaired Pharyngeal- Pudding Teaspoon -- Pharyngeal -- Pharyngeal- Pudding Cup -- Pharyngeal -- Pharyngeal- Honey Teaspoon Delayed swallow initiation-vallecula;Delayed swallow initiation-pyriform sinuses;Reduced anterior laryngeal mobility;Reduced laryngeal elevation;Reduced tongue base retraction;Pharyngeal residue - valleculae;Pharyngeal residue - pyriform;Pharyngeal residue - posterior pharnyx;Lateral channel residue Pharyngeal -- Pharyngeal- Honey Cup Delayed swallow initiation-pyriform sinuses;Reduced anterior laryngeal mobility;Reduced laryngeal elevation;Reduced tongue base retraction;Pharyngeal residue - valleculae;Pharyngeal residue - pyriform;Pharyngeal residue - posterior pharnyx;Lateral channel residue Pharyngeal -- Pharyngeal- Nectar Teaspoon Delayed swallow initiation-pyriform sinuses;Reduced anterior laryngeal mobility;Reduced laryngeal elevation;Reduced tongue base retraction;Pharyngeal residue - valleculae;Pharyngeal residue - pyriform;Pharyngeal residue - posterior pharnyx;Lateral channel residue Pharyngeal -- Pharyngeal- Nectar Cup Delayed swallow initiation-pyriform sinuses;Reduced anterior laryngeal mobility;Reduced laryngeal elevation;Reduced tongue base retraction;Pharyngeal residue - valleculae;Pharyngeal residue - pyriform;Pharyngeal residue - posterior pharnyx;Lateral channel residue;Penetration/Aspiration during swallow Pharyngeal  Material enters airway, CONTACTS cords and not ejected out Pharyngeal- Nectar Straw -- Pharyngeal -- Pharyngeal- Thin Teaspoon -- Pharyngeal -- Pharyngeal- Thin Cup -- Pharyngeal -- Pharyngeal- Thin Straw -- Pharyngeal -- Pharyngeal- Puree Delayed swallow initiation-vallecula;Reduced anterior laryngeal mobility;Reduced laryngeal elevation;Reduced tongue base retraction;Pharyngeal residue - valleculae;Pharyngeal residue - pyriform;Pharyngeal residue - posterior pharnyx Pharyngeal -- Pharyngeal- Mechanical Soft -- Pharyngeal -- Pharyngeal- Regular -- Pharyngeal -- Pharyngeal- Multi-consistency -- Pharyngeal -- Pharyngeal- Pill -- Pharyngeal -- Pharyngeal Comment --  No flowsheet data found. No flowsheet data found. Page, Selinda Orion 10/13/2016, 12:20 PM              Results for orders placed or performed during the hospital encounter of 10/05/16 (from the past 72 hour(s))  CBC     Status: Abnormal   Collection Time: 10/12/16  8:47 AM  Result Value Ref Range   WBC 16.9 (H) 4.0 - 10.5 K/uL   RBC 2.41 (L) 4.22 - 5.81 MIL/uL   Hemoglobin 5.9 (LL) 13.0 - 17.0 g/dL    Comment: REPEATED TO VERIFY CRITICAL VALUE NOTED.  VALUE IS CONSISTENT WITH PREVIOUSLY REPORTED AND CALLED VALUE.    HCT 19.6 (L) 39.0 - 52.0 %   MCV 81.3 78.0 - 100.0 fL   MCH 24.5 (L) 26.0 - 34.0 pg   MCHC 30.1 30.0 - 36.0 g/dL   RDW 22.6 (H) 11.5 - 15.5 %   Platelets 49 (L) 150 - 400 K/uL    Comment: REPEATED TO VERIFY CONSISTENT WITH PREVIOUS RESULT   Glucose, capillary     Status: Abnormal   Collection Time: 10/12/16 11:57 AM  Result Value Ref Range   Glucose-Capillary 257 (H) 65 - 99 mg/dL   Comment 1 Notify RN   Glucose, capillary     Status: Abnormal   Collection Time: 10/12/16  4:53 PM  Result Value Ref Range   Glucose-Capillary 234 (H) 65 - 99 mg/dL   Comment 1 Notify RN   CBC     Status: Abnormal   Collection Time: 10/12/16  8:45 PM  Result Value Ref Range   WBC 15.4 (H) 4.0 - 10.5 K/uL   RBC 3.62 (L) 4.22 - 5.81  MIL/uL   Hemoglobin 9.2 (L) 13.0 - 17.0 g/dL    Comment: POST TRANSFUSION SPECIMEN   HCT 29.2 (L) 39.0 - 52.0 %   MCV 80.7 78.0 - 100.0 fL   MCH 25.4 (L) 26.0 - 34.0 pg   MCHC 31.5 30.0 - 36.0 g/dL  RDW 19.7 (H) 11.5 - 15.5 %   Platelets 54 (L) 150 - 400 K/uL    Comment: REPEATED TO VERIFY CONSISTENT WITH PREVIOUS RESULT   Glucose, capillary     Status: Abnormal   Collection Time: 10/12/16  9:39 PM  Result Value Ref Range   Glucose-Capillary 115 (H) 65 - 99 mg/dL   Comment 1 Notify RN   Vitamin B12     Status: Abnormal   Collection Time: 10/13/16  3:11 AM  Result Value Ref Range   Vitamin B-12 4,320 (H) 180 - 914 pg/mL    Comment: (NOTE) This assay is not validated for testing neonatal or myeloproliferative syndrome specimens for Vitamin B12 levels.   Hepatic function panel     Status: Abnormal   Collection Time: 10/13/16  3:11 AM  Result Value Ref Range   Total Protein 5.8 (L) 6.5 - 8.1 g/dL   Albumin 1.9 (L) 3.5 - 5.0 g/dL   AST 50 (H) 15 - 41 U/L   ALT 38 17 - 63 U/L   Alkaline Phosphatase 197 (H) 38 - 126 U/L   Total Bilirubin 2.2 (H) 0.3 - 1.2 mg/dL   Bilirubin, Direct 0.5 0.1 - 0.5 mg/dL   Indirect Bilirubin 1.7 (H) 0.3 - 0.9 mg/dL  CBC     Status: Abnormal   Collection Time: 10/13/16  5:32 AM  Result Value Ref Range   WBC 15.4 (H) 4.0 - 10.5 K/uL   RBC 3.37 (L) 4.22 - 5.81 MIL/uL   Hemoglobin 8.6 (L) 13.0 - 17.0 g/dL   HCT 27.1 (L) 39.0 - 52.0 %   MCV 80.4 78.0 - 100.0 fL   MCH 25.5 (L) 26.0 - 34.0 pg   MCHC 31.7 30.0 - 36.0 g/dL   RDW 20.6 (H) 11.5 - 15.5 %   Platelets 69 (L) 150 - 400 K/uL    Comment: PLATELET COUNT CONFIRMED BY SMEAR  Glucose, capillary     Status: Abnormal   Collection Time: 10/13/16  6:46 AM  Result Value Ref Range   Glucose-Capillary 153 (H) 65 - 99 mg/dL   Comment 1 Notify RN   Glucose, capillary     Status: Abnormal   Collection Time: 10/13/16 11:41 AM  Result Value Ref Range   Glucose-Capillary 350 (H) 65 - 99 mg/dL  CBC      Status: Abnormal   Collection Time: 10/13/16 12:37 PM  Result Value Ref Range   WBC 17.3 (H) 4.0 - 10.5 K/uL   RBC 3.57 (L) 4.22 - 5.81 MIL/uL   Hemoglobin 9.1 (L) 13.0 - 17.0 g/dL   HCT 29.1 (L) 39.0 - 52.0 %   MCV 81.5 78.0 - 100.0 fL   MCH 25.5 (L) 26.0 - 34.0 pg   MCHC 31.3 30.0 - 36.0 g/dL   RDW 20.8 (H) 11.5 - 15.5 %   Platelets 71 (L) 150 - 400 K/uL    Comment: REPEATED TO VERIFY CONSISTENT WITH PREVIOUS RESULT   CBC     Status: Abnormal   Collection Time: 10/13/16  3:53 PM  Result Value Ref Range   WBC 16.5 (H) 4.0 - 10.5 K/uL   RBC 3.23 (L) 4.22 - 5.81 MIL/uL   Hemoglobin 8.2 (L) 13.0 - 17.0 g/dL   HCT 26.4 (L) 39.0 - 52.0 %   MCV 81.7 78.0 - 100.0 fL   MCH 25.4 (L) 26.0 - 34.0 pg   MCHC 31.1 30.0 - 36.0 g/dL   RDW 20.8 (H) 11.5 - 15.5 %  Platelets 73 (L) 150 - 400 K/uL    Comment: CONSISTENT WITH PREVIOUS RESULT  Glucose, capillary     Status: Abnormal   Collection Time: 10/13/16  4:31 PM  Result Value Ref Range   Glucose-Capillary 275 (H) 65 - 99 mg/dL  CBC     Status: Abnormal   Collection Time: 10/13/16  8:22 PM  Result Value Ref Range   WBC 16.1 (H) 4.0 - 10.5 K/uL   RBC 3.30 (L) 4.22 - 5.81 MIL/uL   Hemoglobin 8.4 (L) 13.0 - 17.0 g/dL   HCT 26.9 (L) 39.0 - 52.0 %   MCV 81.5 78.0 - 100.0 fL   MCH 25.5 (L) 26.0 - 34.0 pg   MCHC 31.2 30.0 - 36.0 g/dL   RDW 20.9 (H) 11.5 - 15.5 %   Platelets 75 (L) 150 - 400 K/uL    Comment: CONSISTENT WITH PREVIOUS RESULT  Occult blood card to lab, stool     Status: Abnormal   Collection Time: 10/14/16 12:09 AM  Result Value Ref Range   Fecal Occult Bld POSITIVE (A) NEGATIVE  CBC     Status: Abnormal   Collection Time: 10/14/16  3:42 AM  Result Value Ref Range   WBC 16.7 (H) 4.0 - 10.5 K/uL   RBC 3.37 (L) 4.22 - 5.81 MIL/uL   Hemoglobin 8.4 (L) 13.0 - 17.0 g/dL   HCT 27.4 (L) 39.0 - 52.0 %   MCV 81.3 78.0 - 100.0 fL   MCH 24.9 (L) 26.0 - 34.0 pg   MCHC 30.7 30.0 - 36.0 g/dL   RDW 21.2 (H) 11.5 - 15.5 %    Platelets 89 (L) 150 - 400 K/uL    Comment: CONSISTENT WITH PREVIOUS RESULT  Basic metabolic panel     Status: Abnormal   Collection Time: 10/14/16  3:42 AM  Result Value Ref Range   Sodium 139 135 - 145 mmol/L   Potassium 3.4 (L) 3.5 - 5.1 mmol/L   Chloride 106 101 - 111 mmol/L   CO2 23 22 - 32 mmol/L   Glucose, Bld 112 (H) 65 - 99 mg/dL   BUN 16 6 - 20 mg/dL   Creatinine, Ser 1.32 (H) 0.61 - 1.24 mg/dL   Calcium 8.4 (L) 8.9 - 10.3 mg/dL   GFR calc non Af Amer 51 (L) >60 mL/min   GFR calc Af Amer 59 (L) >60 mL/min    Comment: (NOTE) The eGFR has been calculated using the CKD EPI equation. This calculation has not been validated in all clinical situations. eGFR's persistently <60 mL/min signify possible Chronic Kidney Disease.    Anion gap 10 5 - 15  Glucose, capillary     Status: Abnormal   Collection Time: 10/14/16  6:57 AM  Result Value Ref Range   Glucose-Capillary 124 (H) 65 - 99 mg/dL  CBC     Status: Abnormal   Collection Time: 10/14/16  8:46 AM  Result Value Ref Range   WBC 15.1 (H) 4.0 - 10.5 K/uL   RBC 3.43 (L) 4.22 - 5.81 MIL/uL   Hemoglobin 8.7 (L) 13.0 - 17.0 g/dL   HCT 28.2 (L) 39.0 - 52.0 %   MCV 82.2 78.0 - 100.0 fL   MCH 25.4 (L) 26.0 - 34.0 pg   MCHC 30.9 30.0 - 36.0 g/dL   RDW 21.3 (H) 11.5 - 15.5 %   Platelets 92 (L) 150 - 400 K/uL    Comment: CONSISTENT WITH PREVIOUS RESULT  Glucose, capillary     Status:  Abnormal   Collection Time: 10/14/16 11:37 AM  Result Value Ref Range   Glucose-Capillary 299 (H) 65 - 99 mg/dL  CBC     Status: Abnormal   Collection Time: 10/14/16  2:30 PM  Result Value Ref Range   WBC 19.2 (H) 4.0 - 10.5 K/uL   RBC 3.71 (L) 4.22 - 5.81 MIL/uL   Hemoglobin 9.6 (L) 13.0 - 17.0 g/dL   HCT 30.8 (L) 39.0 - 52.0 %   MCV 83.0 78.0 - 100.0 fL   MCH 25.9 (L) 26.0 - 34.0 pg   MCHC 31.2 30.0 - 36.0 g/dL   RDW 21.4 (H) 11.5 - 15.5 %   Platelets 118 (L) 150 - 400 K/uL    Comment: CONSISTENT WITH PREVIOUS RESULT  Glucose, capillary      Status: Abnormal   Collection Time: 10/14/16  4:34 PM  Result Value Ref Range   Glucose-Capillary 194 (H) 65 - 99 mg/dL  CBC     Status: Abnormal   Collection Time: 10/14/16  8:49 PM  Result Value Ref Range   WBC 16.4 (H) 4.0 - 10.5 K/uL   RBC 3.27 (L) 4.22 - 5.81 MIL/uL   Hemoglobin 8.3 (L) 13.0 - 17.0 g/dL   HCT 26.9 (L) 39.0 - 52.0 %   MCV 82.3 78.0 - 100.0 fL   MCH 25.4 (L) 26.0 - 34.0 pg   MCHC 30.9 30.0 - 36.0 g/dL   RDW 21.2 (H) 11.5 - 15.5 %   Platelets 114 (L) 150 - 400 K/uL    Comment: CONSISTENT WITH PREVIOUS RESULT  Glucose, capillary     Status: Abnormal   Collection Time: 10/14/16  9:34 PM  Result Value Ref Range   Glucose-Capillary 262 (H) 65 - 99 mg/dL  CBC     Status: Abnormal   Collection Time: 10/15/16  2:38 AM  Result Value Ref Range   WBC 15.3 (H) 4.0 - 10.5 K/uL   RBC 3.34 (L) 4.22 - 5.81 MIL/uL   Hemoglobin 8.4 (L) 13.0 - 17.0 g/dL   HCT 27.6 (L) 39.0 - 52.0 %   MCV 82.6 78.0 - 100.0 fL   MCH 25.1 (L) 26.0 - 34.0 pg   MCHC 30.4 30.0 - 36.0 g/dL   RDW 21.6 (H) 11.5 - 15.5 %   Platelets 108 (L) 150 - 400 K/uL    Comment: CONSISTENT WITH PREVIOUS RESULT  Glucose, capillary     Status: Abnormal   Collection Time: 10/15/16  6:53 AM  Result Value Ref Range   Glucose-Capillary 118 (H) 65 - 99 mg/dL      General: NAD. Vital signs reviewed.  Psych: Unable to assess due to aphasia Heart: RRR. Lungs: CTA bilaterally. Abdomen: Positive bowel sounds, soft  Skin: Erythema on dorsum of left foot.    Neurologic: Alert Expressive language deficits, garbled speech Inconsistently following commands, moving b/l UE and LLE.  Follows some simple commands Unable to assess MMT and sensation due to language Musculoskeletal: No edema. No tenderness.  Assessment/Plan: 1. 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 Physiatrist is providing close team supervision and 24 hour management of active medical problems listed below. Physiatrist and rehab team continue to assess barriers to discharge/monitor patient progress toward functional and medical goals. FIM: Function - Bathing Position: Bed Body parts bathed by patient: Right arm, Chest, Abdomen, Front perineal area, Right upper leg,  Left upper leg Body parts bathed by helper: Left arm, Buttocks, Right lower leg, Left lower leg, Back Bathing not applicable: Right lower leg, Left lower leg Assist Level:  (Moderate assist)  Function- Upper Body Dressing/Undressing What is the patient wearing?: Pull over shirt/dress Pull over shirt/dress - Perfomed by patient: Thread/unthread left sleeve, Put head through opening Pull over shirt/dress - Perfomed by helper: Pull shirt over trunk, Thread/unthread right sleeve Assist Level: Touching or steadying assistance(Pt > 75%) Function - Lower Body Dressing/Undressing What is the patient wearing?: Pants, Ted Hose, Non-skid slipper socks Position: Bed Pants- Performed by patient: Pull pants up/down Pants- Performed by helper: Thread/unthread right pants leg, Pull pants up/down Non-skid slipper socks- Performed by patient: Don/doff left sock Non-skid slipper socks- Performed by helper: Don/doff right sock TED Hose - Performed by helper: Don/doff right TED hose, Don/doff left TED hose Assist for footwear: Dependant Assist for lower body dressing:  (Max A)  Function - Toileting Toileting activity did not occur: No continent bowel/bladder event Toileting steps completed by helper: Adjust clothing prior to toileting, Performs perineal hygiene, Adjust clothing after toileting Assist level:  (Total A)  Function - Air cabin crew transfer activity did not occur: N/A Toilet transfer assistive device: Bedside commode Assist level to toilet: 2 helpers Assist level from toilet: 2 helpers Assist level to  bedside commode (at bedside): 2 helpers Assist level from bedside commode (at bedside): 2 helpers  Function - Chair/bed transfer Chair/bed transfer activity did not occur: Safety/medical concerns Chair/bed transfer method: Squat pivot Chair/bed transfer assist level: Total assist (Pt < 25%) Chair/bed transfer assistive device: Armrests Chair/bed transfer details: Manual facilitation for weight shifting, Tactile cues for posture, Verbal cues for sequencing, Verbal cues for technique  Function - Locomotion: Wheelchair Will patient use wheelchair at discharge?: Yes Type: Manual Wheelchair activity did not occur: Safety/medical concerns Assist Level: Total assistance (Pt < 25%) Wheel 50 feet with 2 turns activity did not occur: Safety/medical concerns Wheel 150 feet activity did not occur: Safety/medical concerns Function - Locomotion: Ambulation Ambulation activity did not occur: Safety/medical concerns Walk 10 feet activity did not occur: Safety/medical concerns Walk 50 feet with 2 turns activity did not occur: Safety/medical concerns Walk 150 feet activity did not occur: Safety/medical concerns Walk 10 feet on uneven surfaces activity did not occur: Safety/medical concerns  Function - Comprehension Comprehension: Auditory Comprehension assist level: Understands basic 50 - 74% of the time/ requires cueing 25 - 49% of the time  Function - Expression Expression: Nonverbal Expression assist level: Expresses basic 25 - 49% of the time/requires cueing 50 - 75% of the time. Uses single words/gestures.  Function - Social Interaction Social Interaction assist level: Interacts appropriately 50 - 74% of the time - May be physically or verbally inappropriate.  Function - Problem Solving Problem solving assist level: Solves basic 25 - 49% of the time - needs direction more than half the time to initiate, plan or complete simple activities  Function - Memory Memory assist level: Recognizes or  recalls less than 25% of the time/requires cueing greater than 75% of the time Patient normally able to recall (first 3 days only): That he or she is in a hospital   Medical Problem List and Plan: 1.  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  DVT Prophylaxis/Anticoagulation: Subcutaneous Lovenox, holding.  Monitor platelet counts and any signs of bleeding  SCDs ordered 3. Pain Management: Tylenol as needed 4. Dysphagia. Dysphagia #1 nectar liquids. Monitor for any signs of aspiration.   Added daily at bedtime IV fluids 5. Neuropsych: This patient is capable of making decisions on his own behalf. 6. Skin/Wound Care: Routine skin checks 7. Fluids/Electrolytes/Nutrition: Routine I&Os 8. Aspiration pneumonia. Augmentin completed. 9. Diabetes mellitus with peripheral neuropathy. Hemoglobin A1c 7.2.   Check blood sugars before meals and at bedtime  Currently Lantus insulin 16 units daily .  Novolog 4U with meals added 12/25, increased to 8U on 12/26, increased to 12U on 12/27  CBG's higher in mid day to PM.--increased lantus to 20u q 0800 on 12/30 Consider BID dosing as well 10. Hypertension/tachycardia. Lopressor 12.5 mg twice a day. Monitor heart rate with increased mobility  Overall controlled 12/29 11. Stage III chronic kidney disease. Baseline creatinine 1.62. Follow-up chemistries  BMP Latest Ref Rng & Units 10/14/2016 10/12/2016 10/11/2016  Glucose 65 - 99 mg/dL 112(H) - 192(H)  BUN 6 - 20 mg/dL 16 - 23(H)  Creatinine 0.61 - 1.24 mg/dL 1.32(H) 1.44(H) 1.13  Sodium 135 - 145 mmol/L 139 - 137  Potassium 3.5 - 5.1 mmol/L 3.4(L) - 4.0  Chloride 101 - 111 mmol/L 106 - 106  CO2 22 - 32 mmol/L 23 - 24  Calcium 8.9 - 10.3 mg/dL 8.4(L) - 8.3(L)   12. Mood. Depakote 250 mg BID added for bouts of agitation and restlessness. 13. Hypothyroidism. Synthroid 14.  Hyperlipidemia. Lipitor 15. Anemia, acute blood loss.   Severe drop in Hb, Hb 8.4 today (stable from 12/30) (transfusion 12/28)   Hemoccult + 12/25  Cont to monitor serially  GI consulted, spoke to GI with plans for EGD, family refused, spoke with GI again, cont PPI  Heme/Onc consulted - ?DIC vs microscopic bleed, spoke to Heme/Onc, appreciate recs 16.  Leukocytosis:    Febrile 12/23, afebrile since  CXR reviewed 12/22 with stable infiltrates, cont to monitor  WBCs down to 15.3 today on abx  Foley replaced  UA+, Ucx Citrobacter   12/27 CXR reviewed: patchy infiltrates RUL, RML, cont Vanc/Zosyn   -repeat on 10/16/16---dc abx??? 17. Thrombocytopenia:   lovenox held. Platelets holding around 100-110k  SRA negative  Will cont to monitor 18. Hypoalbuminemia  Supplement started 12/23 19. Acute lower UTI  Foley to stay in place, difficult placement with tight foreskin.  Will consider Urology consult  Ucx Citrobacter  Cipro started 12/24 for UTI +/- PNA, changed to Vanc/Zosyn 20. Left foot erythema  Possible related to trauma  Pt is already on abx. 21. AKI on CKD  Cr. 1.44 on 12/28  Cont to monitor  Improving  Will cont to monitor  Labs ordered for Monday  -HS IVF  LOS (Days) 10 A FACE TO FACE EVALUATION WAS PERFORMED  SWARTZ,ZACHARY T 10/15/2016, 8:32 AM

## 2016-10-16 ENCOUNTER — Inpatient Hospital Stay (HOSPITAL_COMMUNITY): Payer: Medicare Other

## 2016-10-16 ENCOUNTER — Inpatient Hospital Stay (HOSPITAL_COMMUNITY): Payer: Medicare Other | Admitting: Occupational Therapy

## 2016-10-16 LAB — GLUCOSE, CAPILLARY
GLUCOSE-CAPILLARY: 177 mg/dL — AB (ref 65–99)
GLUCOSE-CAPILLARY: 160 mg/dL — AB (ref 65–99)
Glucose-Capillary: 121 mg/dL — ABNORMAL HIGH (ref 65–99)
Glucose-Capillary: 76 mg/dL (ref 65–99)

## 2016-10-16 LAB — BASIC METABOLIC PANEL
Anion gap: 9 (ref 5–15)
BUN: 18 mg/dL (ref 6–20)
CHLORIDE: 108 mmol/L (ref 101–111)
CO2: 24 mmol/L (ref 22–32)
CREATININE: 1.32 mg/dL — AB (ref 0.61–1.24)
Calcium: 8.4 mg/dL — ABNORMAL LOW (ref 8.9–10.3)
GFR calc non Af Amer: 51 mL/min — ABNORMAL LOW (ref >60)
GFR, EST AFRICAN AMERICAN: 59 mL/min — AB (ref >60)
GLUCOSE: 132 mg/dL — AB (ref 65–99)
Potassium: 3.2 mmol/L — ABNORMAL LOW (ref 3.5–5.1)
Sodium: 141 mmol/L (ref 135–145)

## 2016-10-16 LAB — CBC
HEMATOCRIT: 26 % — AB (ref 39.0–52.0)
HEMOGLOBIN: 8 g/dL — AB (ref 13.0–17.0)
MCH: 26.8 pg (ref 26.0–34.0)
MCHC: 30.8 g/dL (ref 30.0–36.0)
MCV: 87 fL (ref 78.0–100.0)
Platelets: 131 10*3/uL — ABNORMAL LOW (ref 150–400)
RBC: 2.99 MIL/uL — ABNORMAL LOW (ref 4.22–5.81)
RDW: 22.7 % — ABNORMAL HIGH (ref 11.5–15.5)
WBC: 15.1 10*3/uL — ABNORMAL HIGH (ref 4.0–10.5)

## 2016-10-16 MED ORDER — ASPIRIN 81 MG PO CHEW
81.0000 mg | CHEWABLE_TABLET | Freq: Every day | ORAL | Status: DC
  Administered 2016-10-16 – 2016-10-18 (×3): 81 mg via ORAL
  Filled 2016-10-16 (×4): qty 1

## 2016-10-16 NOTE — Progress Notes (Signed)
Occupational Therapy Session Note  Patient Details  Name: Douglas [name] MRN: [id number] Date of Birth: 10/09/1941  Today's Date: 10/16/2016 OT Individual Time: 0700-0800 OT Individual Time Calculation (min): 60 min     Short Term Goals:Week 2:  OT Short Term Goal 1 (Week 2): Pt will maintain unsupported sitting balance with min assist for 2 mins to engage in self-care task OT Short Term Goal 2 (Week 2): Pt will transfer to toilet with max assist of 1 caregiver OT Short Term Goal 3 (Week 2): Pt will complete sit > stand with max assist of one caregiver to progress towards LB dressing at sit > stand level OT Short Term Goal 4 (Week 2): Pt will complete LB dressing with max assist   Skilled Therapeutic Interventions/Progress Updates:    Pt seen for OT session focusing on ADl re-training with grooming and self-feeding. Pt in supine upon arrival, alert and appearing agreeable to tx session. He transferred to EOB with mod A +1, verbal and tactile cuing provided for technique/ sequencing. STEADY with +2assist used to transfer pt to w/c, require due to strong R lean, requiring max- total A for upright balance. Pt with soiled brief upon transfer, stood in STEADY and with +2 assist hygiene completed and new brief donned.  Seated at sink, grooming tasks completed. He required demonstrational and chaining of task to initiate correct use of grooming supplies. Shaving completed total A for safety and pt able to assist with donning shaving cream.  With assist for proper use of utensils and increased time, pt able to self feed breakfast. He displayed difficulty throughout managing utensils, however, automatically correctly manipulate and manage drinking glass.  Pt returned to room with NT at end of session to complete oral care before being  taken back to nurses station. QRB donned.   Therapy Documentation Precautions:  Precautions Precautions: Fall Precaution Comments: R side inattention, strong push to  R side, posterior lean Restrictions Weight Bearing Restrictions: No Pain: Pain Assessment Pain Assessment: 0-10 Pain Score: 0-No pain  See Function Navigator for Current Functional Status.   Therapy/Group: Individual Therapy  [name], [name] 10/16/2016, 6:46 AM

## 2016-10-16 NOTE — Progress Notes (Addendum)
Occupational Therapy Session Note  Patient Details  Name: Douglas [name] MRN: [id number] Date of Birth: Mar 02, 1941  Today's Date: 10/16/2016 OT Individual Time: 3557-3220 and 2542-7062 OT Individual Time Calculation (min): 73 min and 46 min   Short Term Goals: Week 1:  OT Short Term Goal 1 (Week 1): Pt will maintain unsupported sitting balance with min assist for 2 mins to engage in self-care task OT Short Term Goal 1 - Progress (Week 1): Not met OT Short Term Goal 2 (Week 1): Pt will transfer to toilet with max assist of 1 caregiver OT Short Term Goal 2 - Progress (Week 1): Not met OT Short Term Goal 3 (Week 1): Pt will complete sit > stand with max assist of one caregiver to progress towards LB dressing at sit > stand level OT Short Term Goal 3 - Progress (Week 1): Not met OT Short Term Goal 4 (Week 1): Pt will complete LB dressing with max assist  OT Short Term Goal 4 - Progress (Week 1): Not met OT Short Term Goal 5 (Week 1): Pt will complete 2 grooming tasks with < mod verbal cues due to ideational apraxia OT Short Term Goal 5 - Progress (Week 1): Met Week 2:  OT Short Term Goal 1 (Week 2): Pt will maintain unsupported sitting balance with min assist for 2 mins to engage in self-care task OT Short Term Goal 2 (Week 2): Pt will transfer to toilet with max assist of 1 caregiver OT Short Term Goal 3 (Week 2): Pt will complete sit > stand with max assist of one caregiver to progress towards LB dressing at sit > stand level OT Short Term Goal 4 (Week 2): Pt will complete LB dressing with max assist      Skilled Therapeutic Interventions/Progress Updates:   Pt was received sitting at nursing station. RN consulted with in regards to hemoglobin levels. Per RN, monitor 02 sats. When assessed in room, 02 sats 82% on room air and steadily climbed up to 95%. 02 sats remained between 93-97% throughout tx. He completed ADLs w/c level at sink with overall  He exhibited increased initiation with  bathing tasks, using bilateral UEs and joining them at midline to squeeze out wash cloth. Perseveration on washing hands and front perineal area. Cues to redirect required. Tactile cues for attention, sequencing, and right visual field scanning as time progressed due to fatigue. Max Douglas for sit<stand and standing balance at sink for lifting pants over hips due to strong right push. Afterwards pt was returned to nursing station in w/c and left with safety belt in place. RN made aware of pts 02 during ADLs.   2nd Session 1:1 Tx (47 min) Pt participated in skilled OT session focusing on right visual field scanning, attention and functional transfers. Pt was sitting up with nursing in room just finishing lunch at time of arrival. Pt was taken to [facility] gym to complete dynavision activity, settings isolated to lower right quadrant. Lights turned off in room to decrease vision demands. Initially, pt was engaged in task, eyes scanning to right quadrant with min cues and pt hovering right UE (with active assist 50% of time) over blinking light. Pt required max Douglas for pressing light due to coordination deficits. As time progressed, pt perseverated on central blue light and would not return to task despite being provided with max multimodal cues. Pt was returned to room, completing shaving with pt able to apply shaving cream on face with Mod Douglas, bilaterally integrating  UEs. Shaving completed with Total Douglas. Afterwards pt was returned to bed via squat pivot to right side with Max Douglas and tactile cues for head/hip relationship. Pt repositioned in bed with 2 helpers and left with all needs within reach and bed alarm activated at time of departure.   Therapy Documentation Precautions:  Precautions Precautions: Fall Precaution Comments: R side inattention, strong push to R side, posterior lean Restrictions Weight Bearing Restrictions: No   Pain: No c/o or expressions of pain during session    ADL:        See Function  Navigator for Current Functional Status.   Therapy/Group: Individual Therapy  Douglas [name] Douglas [name] 10/16/2016, 12:51 PM

## 2016-10-16 NOTE — Progress Notes (Signed)
Subjective/Complaints: Patient_0  desk. He appears in no distress. No breathing difficulties noted. Remains aphasic  Unable to obtain ROS due to aphasia   Objective: Vital Signs: Blood pressure 139/70, pulse 95, temperature 98 F (36.7 C), temperature source Oral, resp. rate 18, weight 72.3 kg (159 lb 6.3 oz), SpO2 98 %. Dg Chest 2 View  Result Date: 10/16/2016 CLINICAL DATA:  Pneumonia HX: HTN, Diabetes, ex-smoker EXAM: CHEST  2 VIEW COMPARISON:  10/11/2016 FINDINGS: The heart is mildly enlarged. There is persistent patchy infiltrate throughout the right lung, unchanged over multiple prior studies. Minimal patchy density identified at the medial left lung base, also stable. Trace right pleural effusion. IMPRESSION: Unchanged airspace filling opacities right greater than left. Electronically Signed   By: Nolon Nations M.D.   On: 10/16/2016 07:31   Results for orders placed or performed during the hospital encounter of 10/05/16 (from the past 72 hour(s))  Glucose, capillary     Status: Abnormal   Collection Time: 10/13/16 11:41 AM  Result Value Ref Range   Glucose-Capillary 350 (H) 65 - 99 mg/dL  CBC     Status: Abnormal   Collection Time: 10/13/16 12:37 PM  Result Value Ref Range   WBC 17.3 (H) 4.0 - 10.5 K/uL   RBC 3.57 (L) 4.22 - 5.81 MIL/uL   Hemoglobin 9.1 (L) 13.0 - 17.0 g/dL   HCT 29.1 (L) 39.0 - 52.0 %   MCV 81.5 78.0 - 100.0 fL   MCH 25.5 (L) 26.0 - 34.0 pg   MCHC 31.3 30.0 - 36.0 g/dL   RDW 20.8 (H) 11.5 - 15.5 %   Platelets 71 (L) 150 - 400 K/uL    Comment: REPEATED TO VERIFY CONSISTENT WITH PREVIOUS RESULT   CBC     Status: Abnormal   Collection Time: 10/13/16  3:53 PM  Result Value Ref Range   WBC 16.5 (H) 4.0 - 10.5 K/uL   RBC 3.23 (L) 4.22 - 5.81 MIL/uL   Hemoglobin 8.2 (L) 13.0 - 17.0 g/dL   HCT 26.4 (L) 39.0 - 52.0 %   MCV 81.7 78.0 - 100.0 fL   MCH 25.4 (L) 26.0 - 34.0 pg   MCHC 31.1 30.0 - 36.0 g/dL   RDW 20.8 (H) 11.5 - 15.5 %   Platelets 73  (L) 150 - 400 K/uL    Comment: CONSISTENT WITH PREVIOUS RESULT  Glucose, capillary     Status: Abnormal   Collection Time: 10/13/16  4:31 PM  Result Value Ref Range   Glucose-Capillary 275 (H) 65 - 99 mg/dL  CBC     Status: Abnormal   Collection Time: 10/13/16  8:22 PM  Result Value Ref Range   WBC 16.1 (H) 4.0 - 10.5 K/uL   RBC 3.30 (L) 4.22 - 5.81 MIL/uL   Hemoglobin 8.4 (L) 13.0 - 17.0 g/dL   HCT 26.9 (L) 39.0 - 52.0 %   MCV 81.5 78.0 - 100.0 fL   MCH 25.5 (L) 26.0 - 34.0 pg   MCHC 31.2 30.0 - 36.0 g/dL   RDW 20.9 (H) 11.5 - 15.5 %   Platelets 75 (L) 150 - 400 K/uL    Comment: CONSISTENT WITH PREVIOUS RESULT  Occult blood card to lab, stool     Status: Abnormal   Collection Time: 10/14/16 12:09 AM  Result Value Ref Range   Fecal Occult Bld POSITIVE (A) NEGATIVE  CBC     Status: Abnormal   Collection Time: 10/14/16  3:42 AM  Result Value Ref Range  WBC 16.7 (H) 4.0 - 10.5 K/uL   RBC 3.37 (L) 4.22 - 5.81 MIL/uL   Hemoglobin 8.4 (L) 13.0 - 17.0 g/dL   HCT 27.4 (L) 39.0 - 52.0 %   MCV 81.3 78.0 - 100.0 fL   MCH 24.9 (L) 26.0 - 34.0 pg   MCHC 30.7 30.0 - 36.0 g/dL   RDW 21.2 (H) 11.5 - 15.5 %   Platelets 89 (L) 150 - 400 K/uL    Comment: CONSISTENT WITH PREVIOUS RESULT  Basic metabolic panel     Status: Abnormal   Collection Time: 10/14/16  3:42 AM  Result Value Ref Range   Sodium 139 135 - 145 mmol/L   Potassium 3.4 (L) 3.5 - 5.1 mmol/L   Chloride 106 101 - 111 mmol/L   CO2 23 22 - 32 mmol/L   Glucose, Bld 112 (H) 65 - 99 mg/dL   BUN 16 6 - 20 mg/dL   Creatinine, Ser 1.32 (H) 0.61 - 1.24 mg/dL   Calcium 8.4 (L) 8.9 - 10.3 mg/dL   GFR calc non Af Amer 51 (L) >60 mL/min   GFR calc Af Amer 59 (L) >60 mL/min    Comment: (NOTE) The eGFR has been calculated using the CKD EPI equation. This calculation has not been validated in all clinical situations. eGFR's persistently <60 mL/min signify possible Chronic Kidney Disease.    Anion gap 10 5 - 15  Glucose, capillary      Status: Abnormal   Collection Time: 10/14/16  6:57 AM  Result Value Ref Range   Glucose-Capillary 124 (H) 65 - 99 mg/dL  CBC     Status: Abnormal   Collection Time: 10/14/16  8:46 AM  Result Value Ref Range   WBC 15.1 (H) 4.0 - 10.5 K/uL   RBC 3.43 (L) 4.22 - 5.81 MIL/uL   Hemoglobin 8.7 (L) 13.0 - 17.0 g/dL   HCT 28.2 (L) 39.0 - 52.0 %   MCV 82.2 78.0 - 100.0 fL   MCH 25.4 (L) 26.0 - 34.0 pg   MCHC 30.9 30.0 - 36.0 g/dL   RDW 21.3 (H) 11.5 - 15.5 %   Platelets 92 (L) 150 - 400 K/uL    Comment: CONSISTENT WITH PREVIOUS RESULT  Glucose, capillary     Status: Abnormal   Collection Time: 10/14/16 11:37 AM  Result Value Ref Range   Glucose-Capillary 299 (H) 65 - 99 mg/dL  CBC     Status: Abnormal   Collection Time: 10/14/16  2:30 PM  Result Value Ref Range   WBC 19.2 (H) 4.0 - 10.5 K/uL   RBC 3.71 (L) 4.22 - 5.81 MIL/uL   Hemoglobin 9.6 (L) 13.0 - 17.0 g/dL   HCT 30.8 (L) 39.0 - 52.0 %   MCV 83.0 78.0 - 100.0 fL   MCH 25.9 (L) 26.0 - 34.0 pg   MCHC 31.2 30.0 - 36.0 g/dL   RDW 21.4 (H) 11.5 - 15.5 %   Platelets 118 (L) 150 - 400 K/uL    Comment: CONSISTENT WITH PREVIOUS RESULT  Glucose, capillary     Status: Abnormal   Collection Time: 10/14/16  4:34 PM  Result Value Ref Range   Glucose-Capillary 194 (H) 65 - 99 mg/dL  CBC     Status: Abnormal   Collection Time: 10/14/16  8:49 PM  Result Value Ref Range   WBC 16.4 (H) 4.0 - 10.5 K/uL   RBC 3.27 (L) 4.22 - 5.81 MIL/uL   Hemoglobin 8.3 (L) 13.0 - 17.0 g/dL  HCT 26.9 (L) 39.0 - 52.0 %   MCV 82.3 78.0 - 100.0 fL   MCH 25.4 (L) 26.0 - 34.0 pg   MCHC 30.9 30.0 - 36.0 g/dL   RDW 21.2 (H) 11.5 - 15.5 %   Platelets 114 (L) 150 - 400 K/uL    Comment: CONSISTENT WITH PREVIOUS RESULT  Glucose, capillary     Status: Abnormal   Collection Time: 10/14/16  9:34 PM  Result Value Ref Range   Glucose-Capillary 262 (H) 65 - 99 mg/dL  CBC     Status: Abnormal   Collection Time: 10/15/16  2:38 AM  Result Value Ref Range   WBC 15.3  (H) 4.0 - 10.5 K/uL   RBC 3.34 (L) 4.22 - 5.81 MIL/uL   Hemoglobin 8.4 (L) 13.0 - 17.0 g/dL   HCT 27.6 (L) 39.0 - 52.0 %   MCV 82.6 78.0 - 100.0 fL   MCH 25.1 (L) 26.0 - 34.0 pg   MCHC 30.4 30.0 - 36.0 g/dL   RDW 21.6 (H) 11.5 - 15.5 %   Platelets 108 (L) 150 - 400 K/uL    Comment: CONSISTENT WITH PREVIOUS RESULT  Glucose, capillary     Status: Abnormal   Collection Time: 10/15/16  6:53 AM  Result Value Ref Range   Glucose-Capillary 118 (H) 65 - 99 mg/dL  Glucose, capillary     Status: Abnormal   Collection Time: 10/15/16 11:31 AM  Result Value Ref Range   Glucose-Capillary 220 (H) 65 - 99 mg/dL  Glucose, capillary     Status: Abnormal   Collection Time: 10/15/16  4:20 PM  Result Value Ref Range   Glucose-Capillary 236 (H) 65 - 99 mg/dL  Glucose, capillary     Status: Abnormal   Collection Time: 10/15/16  8:45 PM  Result Value Ref Range   Glucose-Capillary 273 (H) 65 - 99 mg/dL  CBC     Status: Abnormal   Collection Time: 10/16/16  4:25 AM  Result Value Ref Range   WBC 15.1 (H) 4.0 - 10.5 K/uL   RBC 2.99 (L) 4.22 - 5.81 MIL/uL   Hemoglobin 8.0 (L) 13.0 - 17.0 g/dL   HCT 26.0 (L) 39.0 - 52.0 %   MCV 87.0 78.0 - 100.0 fL   MCH 26.8 26.0 - 34.0 pg   MCHC 30.8 30.0 - 36.0 g/dL   RDW 22.7 (H) 11.5 - 15.5 %   Platelets 131 (L) 150 - 400 K/uL  Glucose, capillary     Status: Abnormal   Collection Time: 10/16/16  7:08 AM  Result Value Ref Range   Glucose-Capillary 121 (H) 65 - 99 mg/dL      General: NAD. Vital signs reviewed.  Psych: Unable to assess due to aphasia Heart: RRR. Lungs: CTA bilaterally. Abdomen: Positive bowel sounds, soft  Skin: Erythema on dorsum of left foot.    Neurologic: Alert Expressive language deficits, garbled speech Inconsistently following commands, moving b/l UE and LLE.  Follows some simple commands Unable to assess MMT and sensation due to language Musculoskeletal: No edema. No tenderness.  Assessment/Plan: 1. 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 Physiatrist is providing close team supervision and 24 hour management of active medical problems listed below. Physiatrist and rehab team continue to assess barriers to discharge/monitor patient progress toward functional and medical goals. FIM: Function - Bathing Position: Bed Body parts bathed by  patient: Right arm, Chest, Abdomen, Front perineal area, Right upper leg, Left upper leg Body parts bathed by helper: Left arm, Buttocks, Right lower leg, Left lower leg, Back Bathing not applicable: Right lower leg, Left lower leg Assist Level:  (Moderate assist)  Function- Upper Body Dressing/Undressing What is the patient wearing?: Hospital gown Pull over shirt/dress - Perfomed by patient: Thread/unthread left sleeve, Put head through opening Pull over shirt/dress - Perfomed by helper: Pull shirt over trunk, Thread/unthread right sleeve Assist Level: Touching or steadying assistance(Pt > 75%) Function - Lower Body Dressing/Undressing What is the patient wearing?: Pants, Ted Hose, Non-skid slipper socks Position: Bed Pants- Performed by patient: Pull pants up/down Pants- Performed by helper: Thread/unthread right pants leg, Pull pants up/down Non-skid slipper socks- Performed by patient: Don/doff left sock Non-skid slipper socks- Performed by helper: Don/doff right sock TED Hose - Performed by helper: Don/doff right TED hose, Don/doff left TED hose Assist for footwear: Dependant Assist for lower body dressing:  (Max A)  Function - Toileting Toileting activity did not occur: No continent bowel/bladder event Toileting steps completed by helper: Adjust clothing prior to toileting, Performs perineal hygiene, Adjust clothing after toileting Assist level: Two helpers  Function - Air cabin crew transfer activity  did not occur: N/A Toilet transfer assistive device: Bedside commode Assist level to toilet: 2 helpers Assist level from toilet: 2 helpers Assist level to bedside commode (at bedside): 2 helpers Assist level from bedside commode (at bedside): 2 helpers  Function - Chair/bed transfer Chair/bed transfer activity did not occur: Safety/medical concerns Chair/bed transfer method: Other Chair/bed transfer assist level: 2 helpers Chair/bed transfer assistive device: Mechanical lift Mechanical lift: Stedy Chair/bed transfer details: Manual facilitation for weight shifting, Tactile cues for posture, Verbal cues for sequencing, Verbal cues for technique  Function - Locomotion: Wheelchair Will patient use wheelchair at discharge?: Yes Type: Manual Wheelchair activity did not occur: Safety/medical concerns Assist Level: Total assistance (Pt < 25%) Wheel 50 feet with 2 turns activity did not occur: Safety/medical concerns Wheel 150 feet activity did not occur: Safety/medical concerns Function - Locomotion: Ambulation Ambulation activity did not occur: Safety/medical concerns Walk 10 feet activity did not occur: Safety/medical concerns Walk 50 feet with 2 turns activity did not occur: Safety/medical concerns Walk 150 feet activity did not occur: Safety/medical concerns Walk 10 feet on uneven surfaces activity did not occur: Safety/medical concerns  Function - Comprehension Comprehension: Auditory Comprehension assist level: Understands basic 25 - 49% of the time/ requires cueing 50 - 75% of the time  Function - Expression Expression: Nonverbal Expression assist level: Expresses basic 25 - 49% of the time/requires cueing 50 - 75% of the time. Uses single words/gestures.  Function - Social Interaction Social Interaction assist level: Interacts appropriately 50 - 74% of the time - May be physically or verbally inappropriate.  Function - Problem Solving Problem solving assist level: Solves  basic 25 - 49% of the time - needs direction more than half the time to initiate, plan or complete simple activities  Function - Memory Memory assist level: Recognizes or recalls less than 25% of the time/requires cueing greater than 75% of the time Patient normally able to recall (first 3 days only): That he or she is in a hospital   Medical Problem List and Plan: 1.  Right-sided weakness, aphasia, dysphagia secondary to left MCA infarct  Cont CIR  ?Pt wants DNR per nursing , patient globally aphasic, so difficult to assess this actually, to discuss with family--remains FULL CODE  at present 2.  DVT Prophylaxis/Anticoagulation: Subcutaneous Lovenox, holding, plt ct recovering  Monitor platelet counts and any signs of bleeding  SCDs ordered 3. Pain Management: Tylenol as needed 4. Dysphagia. Dysphagia #1 nectar liquids. Monitor for any signs of aspiration.   Added daily at bedtime IV fluids 5. Neuropsych: This patient is capable of making decisions on his own behalf. 6. Skin/Wound Care: Routine skin checks 7. Fluids/Electrolytes/Nutrition: Routine I&Os 8. Aspiration pneumonia. Augmentin completed. 9. Diabetes mellitus with peripheral neuropathy. Hemoglobin A1c 7.2.   Check blood sugars before meals and at bedtime  Currently Lantus insulin 16 units daily . A.m. CBGs are in desired range  Novolog 4U with meals added 12/25, increased to 8U on 12/26, increased to [place] on 12/27  CBG's higher in mid day to PM.--increased lantus to 20u q 0800 on 12/30 Consider BID dosing as well CBG (last 3)   Recent Labs  10/15/16 1620 10/15/16 2045 10/16/16 0708  GLUCAP 236* 273* 121*    10. Hypertension/tachycardia. Lopressor 12.5 mg twice a day. Monitor heart rate with increased mobility  Overall controlled 12/29 11. Stage III chronic kidney disease. Baseline creatinine 1.62. Follow-up chemistries, Currently at baseline  BMP Latest Ref Rng & Units 10/14/2016 10/12/2016 10/11/2016  Glucose 65 - 99  mg/dL 112(H) - 192(H)  BUN 6 - 20 mg/dL 16 - 23(H)  Creatinine 0.61 - 1.24 mg/dL 1.32(H) 1.44(H) 1.13  Sodium 135 - 145 mmol/L 139 - 137  Potassium 3.5 - 5.1 mmol/L 3.4(L) - 4.0  Chloride 101 - 111 mmol/L 106 - 106  CO2 22 - 32 mmol/L 23 - 24  Calcium 8.9 - 10.3 mg/dL 8.4(L) - 8.3(L)   12. Mood. Depakote 250 mg BID added for bouts of agitation and restlessness. 13. Hypothyroidism. Synthroid 14. Hyperlipidemia. Lipitor 15. Anemia, acute blood loss.   Severe drop in Hb, Hb 8.0 today (stable from 12/31) (transfusion 12/28)   Hemoccult + 12/25  Cont to monitor serially  GI consulted, spoke to GI with plans for EGD, family refused, spoke with GI again, cont PPI  Heme/Onc consulted - ?DIC vs microscopic bleed, Heme/Onc on consult, ok from plt standpt to resume antiplatelets but Hgb still dropping, will hold off for now 16.  Leukocytosis:    afebrile x 8d  CXR reviewed 12/22 with stable infiltrates, cont to monitor  WBCs stable 15.1 today on abx  Foley replaced   1/1 CXR reviewed: basilar opacities unchanged RML,D/CVanc/Zosyn  17. Thrombocytopenia:   lovenox held. Platelets holding around 100-110k, per Heme may resume antiplt but will hold off due to hgb still slowly dropping  SRA negative  Will cont to monitor 18. Hypoalbuminemia  Supplement started 12/23 19. Acute lower UTI-resolved  Foley to stay in place, difficult placement with tight foreskin.   Urology consult as outpt   20. Left foot erythema  Possible related to trauma  Pt is already on abx. 21. CKD  At baseline  Cont to monitor  Improving  Will cont to monitor  Labs ordered for Monday  -HS IVF  LOS (Days) 11 A FACE TO FACE EVALUATION WAS PERFORMED  Suni Jarnagin E 10/16/2016, 8:59 AM

## 2016-10-17 ENCOUNTER — Inpatient Hospital Stay (HOSPITAL_COMMUNITY): Payer: Medicare Other | Admitting: Speech Pathology

## 2016-10-17 ENCOUNTER — Inpatient Hospital Stay (HOSPITAL_COMMUNITY): Payer: Medicare Other

## 2016-10-17 ENCOUNTER — Inpatient Hospital Stay (HOSPITAL_COMMUNITY): Payer: Medicare Other | Admitting: Physical Therapy

## 2016-10-17 DIAGNOSIS — D509 Iron deficiency anemia, unspecified: Secondary | ICD-10-CM

## 2016-10-17 DIAGNOSIS — E119 Type 2 diabetes mellitus without complications: Secondary | ICD-10-CM

## 2016-10-17 LAB — CBC
HEMATOCRIT: 29.1 % — AB (ref 39.0–52.0)
HEMOGLOBIN: 9 g/dL — AB (ref 13.0–17.0)
MCH: 26.9 pg (ref 26.0–34.0)
MCHC: 30.9 g/dL (ref 30.0–36.0)
MCV: 87.1 fL (ref 78.0–100.0)
Platelets: 163 10*3/uL (ref 150–400)
RBC: 3.34 MIL/uL — AB (ref 4.22–5.81)
RDW: 23.2 % — ABNORMAL HIGH (ref 11.5–15.5)
WBC: 14.5 10*3/uL — ABNORMAL HIGH (ref 4.0–10.5)

## 2016-10-17 LAB — GLUCOSE, CAPILLARY
GLUCOSE-CAPILLARY: 202 mg/dL — AB (ref 65–99)
GLUCOSE-CAPILLARY: 116 mg/dL — AB (ref 65–99)
Glucose-Capillary: 142 mg/dL — ABNORMAL HIGH (ref 65–99)
Glucose-Capillary: 255 mg/dL — ABNORMAL HIGH (ref 65–99)

## 2016-10-17 MED ORDER — POTASSIUM CHLORIDE 20 MEQ/15ML (10%) PO SOLN
10.0000 meq | Freq: Every day | ORAL | Status: DC
  Administered 2016-10-17 – 2016-10-23 (×7): 10 meq via ORAL
  Filled 2016-10-17 (×8): qty 15

## 2016-10-17 MED ORDER — POTASSIUM CHLORIDE CRYS ER 10 MEQ PO TBCR: 10.0000 meq | EXTENDED_RELEASE_TABLET | Freq: Every day | ORAL | Status: DC

## 2016-10-17 NOTE — Progress Notes (Addendum)
Physical Therapy Session Note  Patient Details  Name: Douglas [name] MRN: [id number] Date of Birth: [birth year]  Today's Date: 10/17/2016 PT Individual Time: 1000-1100 PT Individual Time Calculation (min): 60 min    Short Term Goals: Week 2:  PT Short Term Goal 1 (Week 2): Pt will self propel w/c x25' with mod assist PT Short Term Goal 2 (Week 2): Pt will transfer with mod assist +1 PT Short Term Goal 3 (Week 2): Pt will initiate gait training with PT.   Skilled Therapeutic Interventions/Progress Updates:   Pt resting in w/c at nursing station at start of session, no indication of pain based on faces scale, agreeable to therapy session.  Session focus on sustained attention, midline orientation in standing and sitting, transfers, NMR, gait, and w/c propulsion.   Sit<>stand outside // bars x3 with overall steady assist with pt demonstrating automatic and appropriate hand placement, but requiring verbal cues to maintain LE contact with floor as he attempting to stand with only LLE supported.  Static standing with mirror for visual feedback and intermittent cues for upright posture and midline orientation.  Side stepping R and L with mod/max cues for RLE placement and positioning as well as cues for midline orientation and posture.  Dynamic standing balance with min/mod assist with LUE supported on // bars and RUE engaged in reaching task focus on reaching outside BOS and crossing midline.  Pt requires mod cues to stay attended to task x5 minutes in distracting gym environment.  Gait training x120' with RW with initial min assist for posture and midline orientation with cues for RLE advance, fade to max assist with pt fatigue.  Nustep x5 minutes with BLEs/BUEs with occasional hand over hand facilitation at RUE for attention to task and reciprocal stepping pattern NMR.  Pt requires mod assist for squat/pivot to L and max assist for squat/pivot to R.  W/C propulsion x100' with hand over hand on RUE for  propulsion.  Pt returned to nursing station at end of session and position upright in w/c with QRB in place and needs met.   Therapy Documentation Precautions:  Precautions Precautions: Fall Precaution Comments: R side inattention, strong push to R side, posterior lean Restrictions Weight Bearing Restrictions: No   See Function Navigator for Current Functional Status.   Therapy/Group: Individual Therapy  Briauna Gilmartin E Penven-Crew 10/17/2016, 1:04 PM

## 2016-10-17 NOTE — Progress Notes (Signed)
Occupational Therapy Session Note  Patient Details  Name: Douglas [name] MRN: [id number] Date of Birth: 04/18/1941  Today's Date: 10/17/2016 OT Individual Time: [id number] OT Individual Time Calculation (min): 60 min     Short Term Goals: Week 2:  OT Short Term Goal 1 (Week 2): Pt will maintain unsupported sitting balance with min assist for 2 mins to engage in self-care task OT Short Term Goal 2 (Week 2): Pt will transfer to toilet with max assist of 1 caregiver OT Short Term Goal 3 (Week 2): Pt will complete sit > stand with max assist of one caregiver to progress towards LB dressing at sit > stand level OT Short Term Goal 4 (Week 2): Pt will complete LB dressing with max assist   Skilled Therapeutic Interventions/Progress Updates:    Pt resting in bed upon arrival.  Focus on bed mobility, functional transfers, sit<>stand, standing balance, sitting balance, and BADL retraining.  Pt initiated supine<>sit EOB with HOB elevated, requiring min A.  Pt initiated squat pivot transfer to w/c with mod A for completion.  Pt was able to scoot back into chair without assistance.  Pt engaged in bathing/dressing tasks with sit<>stand from w/c at sink.  Pt initiated bathing and dressing tasks when presented with clothing/supplies.  Pt required mod A for sit<>stand and max A for standing balance with max multimodal cues to shift weight to L.  Pt stands with wide BOS and pushes to R.  Pt remained in w/c with QRB in place and sitting at nursing station.   Therapy Documentation Precautions:  Precautions Precautions: Fall Precaution Comments: R side inattention, strong push to R side, posterior lean Restrictions Weight Bearing Restrictions: No   Pain:  Pt with no s/s of pain  See Function Navigator for Current Functional Status.   Therapy/Group: Individual Therapy  Douglas [name], Douglas [name] 10/17/2016, 10:14 AM

## 2016-10-17 NOTE — Progress Notes (Addendum)
Subjective/Complaints:  Patient participating in PT. Doing reaching tasks. Strong posterior lean. He is alert, but remains aphasic.  Unable to obtain ROS due to aphasia   Objective: Vital Signs: Blood pressure 138/68, pulse 100, temperature 98.2 F (36.8 C), temperature source Oral, resp. rate 20, weight 72.3 kg (159 lb 6.3 oz), SpO2 98 %. Dg Chest 2 View  Result Date: 10/16/2016 CLINICAL DATA:  Pneumonia HX: HTN, Diabetes, ex-smoker EXAM: CHEST  2 VIEW COMPARISON:  10/11/2016 FINDINGS: The heart is mildly enlarged. There is persistent patchy infiltrate throughout the right lung, unchanged over multiple prior studies. Minimal patchy density identified at the medial left lung base, also stable. Trace right pleural effusion. IMPRESSION: Unchanged airspace filling opacities right greater than left. Electronically Signed   By: Nolon Nations M.D.   On: 10/16/2016 07:31   Results for orders placed or performed during the hospital encounter of 10/05/16 (from the past 72 hour(s))  CBC     Status: Abnormal   Collection Time: 10/14/16  8:46 AM  Result Value Ref Range   WBC 15.1 (H) 4.0 - 10.5 K/uL   RBC 3.43 (L) 4.22 - 5.81 MIL/uL   Hemoglobin 8.7 (L) 13.0 - 17.0 g/dL   HCT 28.2 (L) 39.0 - 52.0 %   MCV 82.2 78.0 - 100.0 fL   MCH 25.4 (L) 26.0 - 34.0 pg   MCHC 30.9 30.0 - 36.0 g/dL   RDW 21.3 (H) 11.5 - 15.5 %   Platelets 92 (L) 150 - 400 K/uL    Comment: CONSISTENT WITH PREVIOUS RESULT  Glucose, capillary     Status: Abnormal   Collection Time: 10/14/16 11:37 AM  Result Value Ref Range   Glucose-Capillary 299 (H) 65 - 99 mg/dL  CBC     Status: Abnormal   Collection Time: 10/14/16  2:30 PM  Result Value Ref Range   WBC 19.2 (H) 4.0 - 10.5 K/uL   RBC 3.71 (L) 4.22 - 5.81 MIL/uL   Hemoglobin 9.6 (L) 13.0 - 17.0 g/dL   HCT 30.8 (L) 39.0 - 52.0 %   MCV 83.0 78.0 - 100.0 fL   MCH 25.9 (L) 26.0 - 34.0 pg   MCHC 31.2 30.0 - 36.0 g/dL   RDW 21.4 (H) 11.5 - 15.5 %   Platelets 118 (L)  150 - 400 K/uL    Comment: CONSISTENT WITH PREVIOUS RESULT  Glucose, capillary     Status: Abnormal   Collection Time: 10/14/16  4:34 PM  Result Value Ref Range   Glucose-Capillary 194 (H) 65 - 99 mg/dL  CBC     Status: Abnormal   Collection Time: 10/14/16  8:49 PM  Result Value Ref Range   WBC 16.4 (H) 4.0 - 10.5 K/uL   RBC 3.27 (L) 4.22 - 5.81 MIL/uL   Hemoglobin 8.3 (L) 13.0 - 17.0 g/dL   HCT 26.9 (L) 39.0 - 52.0 %   MCV 82.3 78.0 - 100.0 fL   MCH 25.4 (L) 26.0 - 34.0 pg   MCHC 30.9 30.0 - 36.0 g/dL   RDW 21.2 (H) 11.5 - 15.5 %   Platelets 114 (L) 150 - 400 K/uL    Comment: CONSISTENT WITH PREVIOUS RESULT  Glucose, capillary     Status: Abnormal   Collection Time: 10/14/16  9:34 PM  Result Value Ref Range   Glucose-Capillary 262 (H) 65 - 99 mg/dL  CBC     Status: Abnormal   Collection Time: 10/15/16  2:38 AM  Result Value Ref Range  WBC 15.3 (H) 4.0 - 10.5 K/uL   RBC 3.34 (L) 4.22 - 5.81 MIL/uL   Hemoglobin 8.4 (L) 13.0 - 17.0 g/dL   HCT 27.6 (L) 39.0 - 52.0 %   MCV 82.6 78.0 - 100.0 fL   MCH 25.1 (L) 26.0 - 34.0 pg   MCHC 30.4 30.0 - 36.0 g/dL   RDW 21.6 (H) 11.5 - 15.5 %   Platelets 108 (L) 150 - 400 K/uL    Comment: CONSISTENT WITH PREVIOUS RESULT  Glucose, capillary     Status: Abnormal   Collection Time: 10/15/16  6:53 AM  Result Value Ref Range   Glucose-Capillary 118 (H) 65 - 99 mg/dL  Glucose, capillary     Status: Abnormal   Collection Time: 10/15/16 11:31 AM  Result Value Ref Range   Glucose-Capillary 220 (H) 65 - 99 mg/dL  Glucose, capillary     Status: Abnormal   Collection Time: 10/15/16  4:20 PM  Result Value Ref Range   Glucose-Capillary 236 (H) 65 - 99 mg/dL  Glucose, capillary     Status: Abnormal   Collection Time: 10/15/16  8:45 PM  Result Value Ref Range   Glucose-Capillary 273 (H) 65 - 99 mg/dL  CBC     Status: Abnormal   Collection Time: 10/16/16  4:25 AM  Result Value Ref Range   WBC 15.1 (H) 4.0 - 10.5 K/uL   RBC 2.99 (L) 4.22 - 5.81  MIL/uL   Hemoglobin 8.0 (L) 13.0 - 17.0 g/dL   HCT 26.0 (L) 39.0 - 52.0 %   MCV 87.0 78.0 - 100.0 fL   MCH 26.8 26.0 - 34.0 pg   MCHC 30.8 30.0 - 36.0 g/dL   RDW 22.7 (H) 11.5 - 15.5 %   Platelets 131 (L) 150 - 400 K/uL  Glucose, capillary     Status: Abnormal   Collection Time: 10/16/16  7:08 AM  Result Value Ref Range   Glucose-Capillary 121 (H) 65 - 99 mg/dL  Glucose, capillary     Status: Abnormal   Collection Time: 10/16/16 11:47 AM  Result Value Ref Range   Glucose-Capillary 177 (H) 65 - 99 mg/dL  Basic metabolic panel     Status: Abnormal   Collection Time: 10/16/16  1:44 PM  Result Value Ref Range   Sodium 141 135 - 145 mmol/L   Potassium 3.2 (L) 3.5 - 5.1 mmol/L   Chloride 108 101 - 111 mmol/L   CO2 24 22 - 32 mmol/L   Glucose, Bld 132 (H) 65 - 99 mg/dL   BUN 18 6 - 20 mg/dL   Creatinine, Ser 1.32 (H) 0.61 - 1.24 mg/dL   Calcium 8.4 (L) 8.9 - 10.3 mg/dL   GFR calc non Af Amer 51 (L) >60 mL/min   GFR calc Af Amer 59 (L) >60 mL/min    Comment: (NOTE) The eGFR has been calculated using the CKD EPI equation. This calculation has not been validated in all clinical situations. eGFR's persistently <60 mL/min signify possible Chronic Kidney Disease.    Anion gap 9 5 - 15  Glucose, capillary     Status: Abnormal   Collection Time: 10/16/16  4:30 PM  Result Value Ref Range   Glucose-Capillary 160 (H) 65 - 99 mg/dL  Glucose, capillary     Status: None   Collection Time: 10/16/16  9:00 PM  Result Value Ref Range   Glucose-Capillary 76 65 - 99 mg/dL  Glucose, capillary     Status: Abnormal   Collection Time:  10/17/16  6:29 AM  Result Value Ref Range   Glucose-Capillary 142 (H) 65 - 99 mg/dL   Comment 1 Notify RN       General: NAD. Vital signs reviewed.  Psych: Unable to assess due to aphasia Heart: RRR. Lungs: CTA bilaterally. Abdomen: Positive bowel sounds, soft  Skin: Erythema on dorsum of left foot.    Neurologic: Alert Expressive language deficits, garbled  speech Inconsistently following commands, moving b/l UE and LLE.  Follows some simple commands Unable to assess MMT and sensation due to language Musculoskeletal: No edema. No tenderness.  Assessment/Plan: 1. 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 Physiatrist is providing close team supervision and 24 hour management of active medical problems listed below. Physiatrist and rehab team continue to assess barriers to discharge/monitor patient progress toward functional and medical goals. FIM: Function - Bathing Position: Wheelchair/chair at sink Body parts bathed by patient: Right arm, Left arm, Chest, Abdomen, Front perineal area, Left upper leg Body parts bathed by helper: Buttocks, Right upper leg, Right lower leg, Left lower leg, Back Bathing not applicable: Right lower leg, Left lower leg Assist Level:  (Mod A)  Function- Upper Body Dressing/Undressing What is the patient wearing?: Pull over shirt/dress Pull over shirt/dress - Perfomed by patient: Pull shirt over trunk, Thread/unthread left sleeve, Put head through opening Pull over shirt/dress - Perfomed by helper: Thread/unthread right sleeve Assist Level: Touching or steadying assistance(Pt > 75%) Function - Lower Body Dressing/Undressing What is the patient wearing?: Pants, Non-skid slipper socks, Ted Hose Position: Wheelchair/chair at [employer]- Performed by patient: Pull pants up/down Pants- Performed by helper: Thread/unthread right pants leg, Thread/unthread left pants leg, Pull pants up/down Non-skid slipper socks- Performed by patient: Don/doff left sock Non-skid slipper socks- Performed by helper: Don/doff right sock TED Hose - Performed by helper: Don/doff right TED hose, Don/doff left TED hose Assist for footwear: Maximal assist Assist for lower body dressing:  (Max  A)  Function - Toileting Toileting activity did not occur: No continent bowel/bladder event Toileting steps completed by helper: Adjust clothing prior to toileting, Performs perineal hygiene, Adjust clothing after toileting Assist level: Two helpers  Function - Air cabin crew transfer activity did not occur: N/A Toilet transfer assistive device: Bedside commode Assist level to toilet: 2 helpers Assist level from toilet: 2 helpers Assist level to bedside commode (at bedside): 2 helpers Assist level from bedside commode (at bedside): 2 helpers  Function - Chair/bed transfer Chair/bed transfer activity did not occur: Safety/medical concerns Chair/bed transfer method: Squat pivot Chair/bed transfer assist level: Maximal assist (Pt 25 - 49%/lift and lower) Chair/bed transfer assistive device: Mechanical lift Mechanical lift: Stedy Chair/bed transfer details: Manual facilitation for weight shifting, Tactile cues for posture, Verbal cues for sequencing, Verbal cues for technique, Manual facilitation for placement  Function - Locomotion: Wheelchair Will patient use wheelchair at discharge?: Yes Type: Manual Wheelchair activity did not occur: Safety/medical concerns Assist Level: Total assistance (Pt < 25%) Wheel 50 feet with 2 turns activity did not occur: Safety/medical concerns Wheel 150 feet activity did not occur: Safety/medical concerns Function - Locomotion: Ambulation Ambulation activity did not occur: Safety/medical concerns Walk 10 feet activity did not occur: Safety/medical concerns Walk 50 feet with 2 turns activity did not occur: Safety/medical concerns Walk 150 feet activity did not occur: Safety/medical concerns Walk 10 feet on uneven surfaces activity did not  occur: Safety/medical concerns  Function - Comprehension Comprehension: Auditory Comprehension assist level: Understands basic 25 - 49% of the time/ requires cueing 50 - 75% of the time  Function -  Expression Expression: Nonverbal Expression assist level: Expresses basic 25 - 49% of the time/requires cueing 50 - 75% of the time. Uses single words/gestures.  Function - Social Interaction Social Interaction assist level: Interacts appropriately 50 - 74% of the time - May be physically or verbally inappropriate.  Function - Problem Solving Problem solving assist level: Solves basic 25 - 49% of the time - needs direction more than half the time to initiate, plan or complete simple activities  Function - Memory Memory assist level: Recognizes or recalls less than 25% of the time/requires cueing greater than 75% of the time Patient normally able to recall (first 3 days only): That he or she is in a hospital   Medical Problem List and Plan: 1.  Right-sided weakness, aphasia, dysphagia secondary to left MCA infarct  Cont CIR,Team conference today please see physician documentation under team conference tab, met with team face-to-face to discuss problems,progress, and goals. Formulized individual treatment plan based on medical history, underlying problem and comorbidities.  ?Pt wants DNR per nursing , patient globally aphasic, so difficult to assess this actually, to discuss with family--remains FULL CODE at present 2.  DVT Prophylaxis/Anticoagulation: Subcutaneous Lovenox, holding, plt ct recovering  Monitor platelet counts and any signs of bleeding  SCDs ordered 3. Pain Management: Tylenol as needed 4. Dysphagia. Dysphagia #1 nectar liquids. Monitor for any signs of aspiration.   Added daily at bedtime IV fluids 5. Neuropsych: This patient is capable of making decisions on his own behalf. 6. Skin/Wound Care: Routine skin checks 7. Fluids/Electrolytes/Nutrition: Routine I&Os 8. Aspiration pneumonia. Augmentin completed. 9. Diabetes mellitus with peripheral neuropathy. Hemoglobin A1c 7.2.   Check blood sugars before meals and at bedtime  Currently Lantus insulin 16 units daily . A.m.  CBGs are in desired range  Novolog 4U with meals added 12/25, increased to 8U on 12/26, increased to [place] on 12/27  CBG's higher in mid day to PM.--increased lantus to 20u q 0800 on 12/30 Consider BID dosing as well CBG (last 3)   Recent Labs  10/16/16 1630 10/16/16 2100 10/17/16 0629  GLUCAP 160* 76 142*    10. Hypertension/tachycardia. Lopressor 12.5 mg twice a day. Monitor heart rate with increased mobility  Overall controlled 12/29 11. Stage III chronic kidney disease. Baseline creatinine 1.62. Follow-up chemistries, Currently at baseline  BMP Latest Ref Rng & Units 10/16/2016 10/14/2016 10/12/2016  Glucose 65 - 99 mg/dL 132(H) 112(H) -  BUN 6 - 20 mg/dL 18 16 -  Creatinine 0.61 - 1.24 mg/dL 1.32(H) 1.32(H) 1.44(H)  Sodium 135 - 145 mmol/L 141 139 -  Potassium 3.5 - 5.1 mmol/L 3.2(L) 3.4(L) -  Chloride 101 - 111 mmol/L 108 106 -  CO2 22 - 32 mmol/L 24 23 -  Calcium 8.9 - 10.3 mg/dL 8.4(L) 8.4(L) -   12. Mood. Depakote 250 mg BID added for bouts of agitation and restlessness. 13. Hypothyroidism. Synthroid 14. Hyperlipidemia. Lipitor 15. Anemia, acute blood loss. Also with pancytopenia related to infection, now improving. Appreciate Hemoccult     Hemoccult + 12/25  Cont to monitor serially  GI consulted, spoke to GI with plans for EGD, family refused, spoke with GI again, cont PPI  Heme/Onc consulted - ?DIC vs microscopic bleed, Heme/Onc on consult, ok from plt standpt to resume antiplatelets , given further drop will  discuss with hematology possible repeat transfusion. Will not resume any antiplatelet agents or other anticoagulations at current time. 16.  Leukocytosis:    afebrile x 9d  CXR reviewed 12/22 with stable infiltrates, cont to monitor  WBCs stable 15.1   Foley replaced   1/1 CXR reviewed: basilar opacities unchanged RML,D/CVanc/Zosyn  17. Thrombocytopenia:   lovenox held. Platelets normalized per Heme may resume antiplt but will hold off due to hgb still slowly  dropping  SRA negative  Will cont to monitor 18. Hypoalbuminemia  Supplement started 12/23 19. Acute lower UTI-resolved  Foley to stay in place, difficult placement with tight foreskin.   Urology consult as outpt    20. CKD  At baseline  Cont to monitor  HypoK will supplement  Will cont to monitor   -HS IVF  LOS (Days) 12 A FACE TO FACE EVALUATION WAS PERFORMED  , E 10/17/2016, 8:39 AM

## 2016-10-17 NOTE — Progress Notes (Signed)
Douglas [name]   DOB:02/09/1941   WG#:[id number]   VHQ#:[id number]#:[id number]  Hematology follow up  Subjective: Pt was sitting and eating dinner with assistance when I saw him today, still aphasic, but is able to answer simple questions. He denies any recent signs of bleeding, no melena or hematochezia. He thrombocytopenia has resolved, he is back on aspirin now. He received blood transfusion and IV iron last week.    Objective:  Vitals:   10/17/16 0608 10/17/16 1452  BP: 138/68 (!) 148/68  Pulse: 100 96  Resp: 20 18  Temp: 98.2 F (36.8 C) 98.4 F (36.9 C)    Body mass index is 22.87 kg/m.  Intake/Output Summary (Last 24 hours) at 10/17/16 2228 Last data filed at 10/17/16 1814  Gross per 24 hour  Intake              920 ml  Output              800 ml  Net              120 ml     Sclerae unicteric  Oropharynx clear  No peripheral adenopathy  Lungs clear -- no rales or rhonchi  Heart regular rate and rhythm  Abdomen benign  MSK no focal spinal tenderness, no peripheral edema     CBG (last 3)   Recent Labs  10/17/16 1133 10/17/16 1649 10/17/16 2057  GLUCAP 255* 202* 116*     Labs:  Lab Results  Component Value Date   WBC 14.5 (H) 10/17/2016   HGB 9.0 (L) 10/17/2016   HCT 29.1 (L) 10/17/2016   MCV 87.1 10/17/2016   PLT 163 10/17/2016   NEUTROABS 14.0 (H) 10/11/2016   CMP Latest Ref Rng & Units 10/16/2016 10/14/2016 10/13/2016  Glucose 65 - 99 mg/dL 413(K132(H) 440(N112(H) -  BUN 6 - 20 mg/dL 18 16 -  Creatinine 0.270.61 - 1.24 mg/dL 2.53(G1.32(H) 6.44(I1.32(H) -  Sodium 135 - 145 mmol/L 141 139 -  Potassium 3.5 - 5.1 mmol/L 3.2(L) 3.4(L) -  Chloride 101 - 111 mmol/L 108 106 -  CO2 22 - 32 mmol/L 24 23 -  Calcium 8.9 - 10.3 mg/dL 3.4(V8.4(L) 4.2(V8.4(L) -  Total Protein 6.5 - 8.1 g/dL - - 5.8(L)  Total Bilirubin 0.3 - 1.2 mg/dL - - 2.2(H)  Alkaline Phos 38 - 126 U/L - - 197(H)  AST 15 - 41 U/L - - 50(H)  ALT 17 - 63 U/L - - 38     Urine Studies No results for input(s): UHGB, CRYS in the last  72 hours.  Invalid input(s): UACOL, UAPR, USPG, UPH, UTP, UGL, UKET, UBIL, UNIT, UROB, ULEU, UEPI, UWBC, [place]URBC, HubbardUBAC, ReasnorAST, SpryUCOM, [place]BILUA  Basic Metabolic Panel:  Recent Labs Lab 10/11/16 0750 10/12/16 0228 10/14/16 0342 10/16/16 1344  NA 137  --  139 141  K 4.0  --  3.4* 3.2*  CL 106  --  106 108  CO2 24  --  23 24  GLUCOSE 192*  --  112* 132*  BUN 23*  --  16 18  CREATININE 1.13 1.44* 1.32* 1.32*  CALCIUM 8.3*  --  8.4* 8.4*   GFR Estimated Creatinine Clearance: 49.4 mL/min (by C-G formula based on SCr of 1.32 mg/dL (H)). Liver Function Tests:  Recent Labs Lab 10/13/16 0311  AST 50*  ALT 38  ALKPHOS 197*  BILITOT 2.2*  PROT 5.8*  ALBUMIN 1.9*   No results for input(s): LIPASE, AMYLASE in the last 168 hours. No  results for input(s): AMMONIA in the last 168 hours. Coagulation profile  Recent Labs Lab 10/11/16 1811  INR 1.30    CBC:  Recent Labs Lab 10/11/16 0750  10/14/16 1430 10/14/16 2049 10/15/16 0238 10/16/16 0425 10/17/16 1534  WBC 18.5*  < > 19.2* 16.4* 15.3* 15.1* 14.5*  NEUTROABS 14.0*  --   --   --   --   --   --   HGB 6.2*  < > 9.6* 8.3* 8.4* 8.0* 9.0*  HCT 21.2*  < > 30.8* 26.9* 27.6* 26.0* 29.1*  MCV 82.8  < > 83.0 82.3 82.6 87.0 87.1  PLT 49*  < > 118* 114* 108* 131* 163  < > = values in this interval not displayed. Cardiac Enzymes: No results for input(s): CKTOTAL, CKMB, CKMBINDEX, TROPONINI in the last 168 hours. BNP: Invalid input(s): POCBNP CBG:  Recent Labs Lab 10/16/16 2100 10/17/16 0629 10/17/16 1133 10/17/16 1649 10/17/16 2057  GLUCAP 76 142* 255* 202* 116*   D-Dimer No results for input(s): DDIMER in the last 72 hours. Hgb A1c No results for input(s): HGBA1C in the last 72 hours. Lipid Profile No results for input(s): CHOL, HDL, LDLCALC, TRIG, CHOLHDL, LDLDIRECT in the last 72 hours. Thyroid function studies No results for input(s): TSH, T4TOTAL, T3FREE, THYROIDAB in the last 72 hours.  Invalid input(s):  FREET3 Anemia work up No results for input(s): VITAMINB12, FOLATE, FERRITIN, TIBC, IRON, RETICCTPCT in the last 72 hours. Microbiology Recent Results (from the past 240 hour(s))  Urine culture     Status: Abnormal   Collection Time: 10/07/16 11:30 PM  Result Value Ref Range Status   Specimen Description URINE, CLEAN CATCH  Final   Special Requests NONE  Final   Culture 80,000 COLONIES/mL CITROBACTER FREUNDII (A)  Final   Report Status 10/10/2016 FINAL  Final   Organism ID, Bacteria CITROBACTER FREUNDII (A)  Final      Susceptibility   Citrobacter freundii - MIC*    CEFAZOLIN >=64 RESISTANT Resistant     CEFTRIAXONE <=1 SENSITIVE Sensitive     CIPROFLOXACIN <=0.25 SENSITIVE Sensitive     GENTAMICIN <=1 SENSITIVE Sensitive     IMIPENEM 1 SENSITIVE Sensitive     NITROFURANTOIN <=16 SENSITIVE Sensitive     TRIMETH/SULFA <=20 SENSITIVE Sensitive     PIP/TAZO <=4 SENSITIVE Sensitive     * 80,000 COLONIES/mL CITROBACTER FREUNDII      Studies:  Dg Chest 2 View  Result Date: 10/16/2016 CLINICAL DATA:  Pneumonia HX: HTN, Diabetes, ex-smoker EXAM: CHEST  2 VIEW COMPARISON:  10/11/2016 FINDINGS: The heart is mildly enlarged. There is persistent patchy infiltrate throughout the right lung, unchanged over multiple prior studies. Minimal patchy density identified at the medial left lung base, also stable. Trace right pleural effusion. IMPRESSION: Unchanged airspace filling opacities right greater than left. Electronically Signed   By: Norva Pavlov M.D.   On: 10/16/2016 07:31    Assessment: 75 y.o. male with history of diabetes mellitus,Remote tobacco and alcohol use, hypertension, hyperlipidemia, chronic kidney disease who was recently admitted on 09/26/2016 for left MCA ischemic stroke. He has residual severe aphasia and right-sided weakness  1. Recent left MCA ischemic stroke  2. Worsening anemia, iron deficient anemia 3. thrombocytopenia,  Resolved now  4. DM 5. Hyperlipidemia 6.  Right pneumonia, on cipro 12/24-12/27, changed to vanco and zosyn on 10/11/16 7. Citrobacter UTI    Recommendations: -His formal cytopenia has resolved, likely secondary to his recent infection and antiplatelet medication -Aspirin has resumed. Okay  to restart the Plavix if needed  -his anemia has improved after blood transfusion and iv feraheme last week, 9.0 today  -anemia work up was negative for HIT, autoimmune hemolysis and TTP (ADAMTS activity slightly low, not sufficient for TTP)  -his anemia is likely secondary to iron deficient anemia and DIC from his infection -second dose Feraheme this Friday or next week, I will order  -please follow up CBC every 2-3 days  -I will follow up as need     Malachy Mood, MD 10/17/2016

## 2016-10-17 NOTE — Progress Notes (Signed)
Physical Therapy Session Note  Patient Details  Name: Douglas [name] MRN: [id number] Date of Birth: 03/27/1941  Today's Date: 10/17/2016 PT Individual Time: 1300-1330 PT Individual Time Calculation (min): 30 min    Short Term Goals: Week 2:  PT Short Term Goal 1 (Week 2): Pt will self propel w/c x25' with mod assist PT Short Term Goal 2 (Week 2): Pt will transfer with mod assist +1 PT Short Term Goal 3 (Week 2): Pt will initiate gait training with PT.   Skilled Therapeutic Interventions/Progress Updates:    Session focused on addressing neuro re-ed for postural control re-training, gait, balance, reorientation to midline, and attention. Mod assist for sit <> stands with max cues for hand placement and technique and hand over hand assist at times for positioning of RUE. Pt able to gait with RW x 50' with mod to max assist as fatiguing and increasing R lateral lean - cues for attention to R visual field. In standing used horeshoes for functional reaching tasks to promote weightshifting, coordination, balance, and attention to R - min assist overall until required max assist due to pushing tendencies (to R). End of session left up at nurses station with safety belt donned.    Therapy Documentation Precautions:  Precautions Precautions: Fall Precaution Comments: R side inattention, strong push to R side, posterior lean Restrictions Weight Bearing Restrictions: No    Pain: No complaints.    See Function Navigator for Current Functional Status.   Therapy/Group: Individual Therapy  Karolee StampsGray, Douglas [name]  Douglas [name] Douglas [name], PT, DPT  10/17/2016, 3:37 PM

## 2016-10-17 NOTE — Progress Notes (Signed)
Speech Language Pathology Daily Session Note  Patient Details  Name: Douglas [name] MRN: [id number] Date of Birth: 02/04/1941  Today's Date: 10/17/2016 SLP Individual Time: 1400-1500 SLP Individual Time Calculation (min): 60 min   Short Term Goals: Week 2: SLP Short Term Goal 1 (Week 2): Pt will answer yes/no questions for 75% accuracy with max assist verbal cues.   SLP Short Term Goal 2 (Week 2): Pt will follow 1 step commands for 75% accuracy with max assist verbal cues.   SLP Short Term Goal 3 (Week 2): Pt will consume dys 1 textures and honey thick liquids via teaspoon with mod assist for use of swallowing precautions and minimal overt s/s of aspiration.   SLP Short Term Goal 4 (Week 2): Pt will verbalize at the word level during structured tasks for 50% accuracy with max assist multimodal cues. SLP Short Term Goal 5 (Week 2): Pt will utilize multimodal means of communication to convey needs and wants to caregivers with mod assist cues.    Skilled Therapeutic Interventions:   Skilled treatment session focused on addressing speech and swallow goals. SLP facilitated session by Mod-Max assist for 75% accuracy with biographical and environmental yes/no questions.  SLP also attempted to facilitate session by providing multiple opportunities in multiple modes to address communication goals; however, patient stated "no," and pushed them all away.  SLP attempted singing, counting, object identification, and cards.  SLP also facilitated session with set-up of honey-thick liquids in cup with a spoon.  Patient required Max assist verbal, visual, and tactile cues to self-correct attempting to drink from spoon and use it to scoop instead.  Patient with delayed cough x1 suspect due to poor attention to residue versus secretions at end of trials.  RN reported no overt s/s with breakfast or lunch.  As a result, recommend to continue with current plan of care.    Function:  Eating Eating   Modified  Consistency Diet: Yes Eating Assist Level: Set up assist for;Supervision or verbal cues;Help with picking up utensils;Helper scoops food on utensil;Helper brings food to mouth   Eating Set Up Assist For: Opening containers Helper Scoops Food on Utensil: Occasionally Helper Brings Food to Mouth: Occasionally   Cognition Comprehension Comprehension assist level: Understands basic 25 - 49% of the time/ requires cueing 50 - 75% of the time  Expression   Expression assist level: Expresses basis less than 25% of the time/requires cueing >75% of the time.  Social Interaction Social Interaction assist level: Interacts appropriately 50 - 74% of the time - May be physically or verbally inappropriate.  Problem Solving Problem solving assist level: Solves basic 25 - 49% of the time - needs direction more than half the time to initiate, plan or complete simple activities  Memory Memory assist level: Recognizes or recalls 25 - 49% of the time/requires cueing 50 - 75% of the time    Pain Pain Assessment Pain Assessment: No/denies pain  Therapy/Group: Individual Therapy  Charlane FerrettiMelissa Eara Burruel, M.A., CCC-SLP [phone]  [name] 10/17/2016, 2:38 PM

## 2016-10-17 NOTE — Plan of Care (Signed)
Problem: RH SKIN INTEGRITY Goal: RH STG MAINTAIN SKIN INTEGRITY WITH ASSISTANCE STG Maintain Skin Integrity With mod Assistance.   Outcome: Not Progressing MASD to area around anus, 3 cm circumference.  Tip of penis area bright red noted in foley care.

## 2016-10-18 ENCOUNTER — Inpatient Hospital Stay (HOSPITAL_COMMUNITY): Payer: Medicare Other

## 2016-10-18 ENCOUNTER — Inpatient Hospital Stay (HOSPITAL_COMMUNITY): Payer: Medicare Other | Admitting: Occupational Therapy

## 2016-10-18 ENCOUNTER — Inpatient Hospital Stay (HOSPITAL_COMMUNITY): Payer: Medicare Other | Admitting: Physical Therapy

## 2016-10-18 ENCOUNTER — Inpatient Hospital Stay (HOSPITAL_COMMUNITY): Payer: Medicare Other | Admitting: Speech Pathology

## 2016-10-18 LAB — GLUCOSE, CAPILLARY
GLUCOSE-CAPILLARY: 118 mg/dL — AB (ref 65–99)
GLUCOSE-CAPILLARY: 192 mg/dL — AB (ref 65–99)
GLUCOSE-CAPILLARY: 181 mg/dL — AB (ref 65–99)
Glucose-Capillary: 130 mg/dL — ABNORMAL HIGH (ref 65–99)

## 2016-10-18 LAB — CBC
HCT: 25.1 % — ABNORMAL LOW (ref 39.0–52.0)
Hemoglobin: 7.5 g/dL — ABNORMAL LOW (ref 13.0–17.0)
MCH: 25.8 pg — AB (ref 26.0–34.0)
MCHC: 29.9 g/dL — ABNORMAL LOW (ref 30.0–36.0)
MCV: 86.3 fL (ref 78.0–100.0)
PLATELETS: 167 10*3/uL (ref 150–400)
RBC: 2.91 MIL/uL — ABNORMAL LOW (ref 4.22–5.81)
RDW: 23 % — AB (ref 11.5–15.5)
WBC: 11.5 10*3/uL — AB (ref 4.0–10.5)

## 2016-10-18 NOTE — Progress Notes (Signed)
Physical Therapy Session Note  Patient Details  Name: Douglas [name] MRN: [id number] Date of Birth: [birth year]  Today's Date: 10/18/2016 PT Individual Time: 0800-0859 PT Individual Time Calculation (min): 59 min    Short Term Goals: Week 2:  PT Short Term Goal 1 (Week 2): Pt will self propel w/c x25' with mod assist PT Short Term Goal 2 (Week 2): Pt will transfer with mod assist +1 PT Short Term Goal 3 (Week 2): Pt will initiate gait training with PT.   Skilled Therapeutic Interventions/Progress Updates:     Patient received supine in bed and agreeable to PT.   Bed mobility to come to sitting EOB with min assist at the trunk. Once sitting EOB, min assist from PT to maintain sitting balance EOB to don pants. Moderate cues for proper sequencing to don impaired LE first. Patient actively resists hand over hand to guide pants to RLE first. Sit<>stand x 3 to pul pants to waist with max assist progressing to mod assist due to inability to divide attention from buttoning pants and standing.   Ambulatory transfer with mod assist using RW. Mad cues from PT for proper use of AD and improved weight shift to the L to prevent pushing to the R.   Transfer training including sit<>stand with min assist x 4 from elevated seat height and mod assist x 2 with constant hand over hand guidance to push form sitting surface with LUE and prevent pushing to the R. Stand pivot transfer mod assist x 4 mod cues for UE placement as well as improved weight shift to the L to allow increased step length on the R and prevent lateral LOB.   Gait training with RW instructed by PT for 77f x 2 with min-mod assist for AD management and weight shift to the L. Constance cues for improved step height/length on the L to encourage normalized gait pattern.    Standing balance with lateral reach to horse shoes on R for forced use of R UE and then reach/lean to L to enforce weight shift to the L. Patient required mod cues 2 step  processing as well as improved awareness of LOB to the R and use of ankle strategy to prevent LOB.   Patient returned to RN station at end of treatment with QRB in place and all needs met.     Therapy Documentation Precautions:  Precautions Precautions: Fall Precaution Comments: R side inattention, strong push to R side, posterior lean Restrictions Weight Bearing Restrictions: No Pain: 0/10   See Function Navigator for Current Functional Status.   Therapy/Group: Individual Therapy  ALorie Phenix1/12/2016, 10:02 AM

## 2016-10-18 NOTE — Progress Notes (Signed)
Occupational Therapy Session Note  Patient Details  Name: Douglas [name] Stukey MRN: [id number] Date of Birth: 04/27/1941  Today's Date: 10/18/2016 OT Individual Time: 4098-11910934-1030 OT Individual Time Calculation (min): 56 min     Short Term Goals: Week 2:  OT Short Term Goal 1 (Week 2): Pt will maintain unsupported sitting balance with min assist for 2 mins to engage in self-care task OT Short Term Goal 2 (Week 2): Pt will transfer to toilet with max assist of 1 caregiver OT Short Term Goal 3 (Week 2): Pt will complete sit > stand with max assist of one caregiver to progress towards LB dressing at sit > stand level OT Short Term Goal 4 (Week 2): Pt will complete LB dressing with max assist   Skilled Therapeutic Interventions/Progress Updates:    Treatment session with focus on arousal, attention to task, following one step directions, sit > stand, and transfers. Pt received at RN station asleep.  Required increased time and tactile cues to arouse.  Completed squat pivot transfer to Rt with max assist +2 due to decreased arousal, pushing tendencies, and impulsivity.  Engaged in sit > stand with pt progressing from mod assist initially to min guard.  Therapist blocking RLE to promote weight bearing and proper placement during transitional movement as well as to attempt to minimize pushing.  Pt reaching with LUE to challenge weight shift and visual scanning while in standing.  Therapist encouraging pt to attempt use of RUE with reaching with pt demonstrating decreased initiation, scanning to Rt visual field, and resistance when therapist attempting hand over hand to increase functional reaching.  Utilized mirror with focus on increased midline awareness and Rt attention.  Pt unable to initiate reaching up to mirror to erase numbers with focus on visual scanning, attention to task, following directions with pt shaking head "no" and with resistance when therapist assisting with hand over hand to initiate  participation.  Pt perseverating on adjusting pants, requiring cues to redirect.  Able to discover that pt had incontinent BM.  Returned to room via w/c and completed squat pivot transfer mod assist back to bed.  Completed hygiene at bed level with +2 assist.    Therapy Documentation Precautions:  Precautions Precautions: Fall Precaution Comments: R side inattention, strong push to R side, posterior lean Restrictions Weight Bearing Restrictions: No Pain: Pain Assessment Pain Assessment: No/denies pain  See Function Navigator for Current Functional Status.   Therapy/Group: Individual Therapy  Rosalio LoudHOXIE, Meigan Pates 10/18/2016, 2:26 PM

## 2016-10-18 NOTE — Progress Notes (Signed)
Physical Therapy Note  Patient Details  Name: Osborn CohoJames L Leinberger MRN: [id number] Date of Birth: 02/15/1941 Today's Date: 10/18/2016  1330-1445, 75 min individual tx Pain: none communicated  Bed moiblity using L rail with min assist.  Bed> Stedy with +2 focusing on avoiding pusher syndrome. high sitting on Stedy> < standing x 8 with min/mod cues.  Slide board transfer recliner w/c> R with mod assist +2 for safety.  neuromuscular re-education via forced use for R pincer grasp for manipulating cards; reaching with L hand to L to avoid pushing to L.  Pt stood x 5 minutes x 2 in static stander to manipulate cards with L hand encouraging wt shift to L. Slide board to L mat> w/c slightly downhill, mod assist. Pt left resting in w/c with quick release belt applied and all needs within reach.  See function navigator for current status.  Jaquasia Doscher 10/18/2016, 1:01 PM

## 2016-10-18 NOTE — Patient Care Conference (Signed)
Inpatient RehabilitationTeam Conference and Plan of Care Update Date: 10/18/2016   Time: 11:00 AM    Patient Name: Douglas [name]      Medical Record Number: [id number]  Date of Birth: 03/11/1941 Sex: Male         Room/Bed: 4W08C/4W08C-01 Payor Info: Payor: MEDICARE / Plan: MEDICARE PART A AND B / Product Type: *No Product type* /    Admitting Diagnosis: CVA  Admit Date/Time:  10/05/2016  6:35 PM Admission Comments: No comment available   Primary Diagnosis:  <principal problem not specified> Principal Problem: <principal problem not specified>  Patient Active Problem List   Diagnosis Date Noted  . HCAP (healthcare-associated pneumonia)   . Sepsis (HCC)   . AKI (acute kidney injury) (HCC)   . Erythema   . Acute lower UTI   . Dysphagia due to recent cerebral infarction   . Hypoalbuminemia due to protein-calorie malnutrition (HCC)   . Thrombocytopenia (HCC)   . Leukocytosis   . Acute blood loss anemia   . Type 2 diabetes mellitus with peripheral neuropathy (HCC)   . Labile blood glucose   . 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 Right hemiparesis (HCC)   . Urinary retention   . Acute delirium   . CKD (chronic kidney disease) 09/27/2016  . Elevated troponin 09/27/2016  . Anemia 09/27/2016  . Acute embolic stroke (HCC)   . Acute systolic heart failure (HCC)   . Benign essential HTN   . Tachycardia   . CVA (cerebral vascular accident) (HCC) 09/26/2016  . Hyperlipidemia 11/30/2013  . Essential hypertension 11/30/2013  . DM (diabetes mellitus) (HCC) 11/30/2013    Expected Discharge Date: Expected Discharge Date: 11/03/16  Team Members Present: Physician leading conference: Dr. Claudette Laws Social Worker Present: Dossie Der, LCSW Nurse Present: Carmie End, RN PT Present: Teodoro Kil, PT OT Present: Rosalio Loud, OT SLP Present: Jackalyn Lombard, SLP PPS Coordinator present : Tora Duck, RN, CRRN     Current Status/Progress Goal Weekly Team  Focus  Medical   Anemia, UTI resolved, thrombocytopenia resolved, still with severe aphasia, right-sided hemiparesis, poor intake  Maintain medical stability, monitor hemoglobin,  Monitor for further blood loss, establish need for repeat transfusion   Bowel/Bladder   Bowel incontinence---LBM 10/17/2016; Foley catheter for bladder  Pt will manage bowel with min assist; Pt will manage bladder with foley catheter with total assist  Monitor for changes in bowel continence and toilet; maintain and assess for need of foley catheter   Swallow/Nutrition/ Hydration   Downgraded to Dys 1, honey thick liquids per MBS   min assist   toleration of diet   ADL's   min assist with max demonstrational cueing for bathing/dressing, max to +2 for LB bathing/dressing, cues to incorporate use of RUE into functional tasks  min to mod assist   ADL retraining, balance retraining, midline orientation, Rt attention, NMR   Mobility   max assist overall  overall mod assist  cognition, awareness, attention, NMR, postural control, midline orientation, transfers, ambulation, w/c propulsion   Communication   max to total assist for functional communication   min-mod assist   continue to address automatic sequqnces, yes/no response accuracy, multimodal communication    Safety/Cognition/ Behavioral Observations  decreased focused and sustained attention to tasks, continues to be limited by poor endurance, max to total assist   min assist   attention, arousal    Pain   Pt does not complain of any pain; Faces=0  Pt will maintain current  pain status  Monitor for signs of pain; Use Faces scale   Skin   Scattered bruising to body; dry skin; MASD to anus  Pt will be free of skin breakdown  Monitor for changes in skin integrity; manage MASD with barrier cream      *See Care Plan and progress notes for long and short-term goals.  Barriers to Discharge: The factorial anemia, and does have positive occult blood in stool. Has  required transfusion last week.    Possible Resolutions to Barriers:  Hematology oncology consult, consider internal medicine consult    Discharge Planning/Teaching Needs:  Plan for pt to go to a NH once medically stable and able to be one person care. Niece and Nephew and roommate visit when they can-all work      Team Discussion:  Making good progress in PT-ambulated 100 ft with mod assist level. Doing well in OT also still max assist level. Diet downgraded to honey thick after MBS last week. IV's in pm-MD to see if can discharge and see if can hydrate himself. Hemoglobin dropping again-have GI come back and may need transfusion. Incontinent of bowel and still has foley. MD reports medical issues [facility]   Revisions to Treatment Plan:  Downgraded diet to honey thick liquids after MBS   Continued Need for Acute Rehabilitation Level of Care: The patient requires daily medical management by a physician with specialized training in physical medicine and rehabilitation for the following conditions: Daily direction of a multidisciplinary physical rehabilitation program to ensure safe treatment while eliciting the highest outcome that is of practical value to the patient.: Yes Daily medical management of patient stability for increased activity during participation in an intensive rehabilitation regime.: Yes Daily analysis of laboratory values and/or radiology reports with any subsequent need for medication adjustment of medical intervention for : Neurological problems;Other;Nutritional problems  [name], [name] 10/18/2016, 3:30 PM

## 2016-10-18 NOTE — Progress Notes (Signed)
Douglas [name] 5:10 PM  Subjective: Patient we consulted and discussed with my partner dr Levora Angelbrahmbhatt and his [name] computer chart reviewed and our office computer chart reviewed and he has no GI complaints and no obvious signs of bleeding and is still on a baby aspirin a day and his previous GI workup was reviewed including a colonoscopy in 2007 2008 2011 and 2017 as well as endoscopies during at least the last 2 and he had a capsule endoscopy 2011 and a recent CT and he has episodic guaiac positivity and occasional hemoglobin drop and his case discussed with his nurse as well and his swallowing study reviewed  Objective: Vital signs stable afebrile no acute distress patient sitting comfortably behind the nurse's station abdomen is soft nontender hemoglobin slight drop  Assessment: Subacute GI blood loss in patient on aspirin  Plan: I offered the patient and endoscopy and to place the endoscopic capsule if the endoscopy was negative since he probably cannot swallow the capsule however he refused so I will consider stopping his aspirin watching his hemoglobin and repeat guaiacs in one week and we are happy to see back as needed and please call me this week if he changes his mind about the above procedure or if any other GI question or problem  [facility],Douglas [name]  Pager [phone] After 5PM or if no answer call [phone]

## 2016-10-18 NOTE — Plan of Care (Signed)
Problem: RH KNOWLEDGE DEFICIT Goal: RH STG INCREASE KNOWLEDGE OF DIABETES Patient's caregiver will be able to verbalize understanding and management of diabetes with food intake and managing mediations  Outcome: Not Progressing Patient's family has not been present for teaching. Goal: RH STG INCREASE KNOWLEDGE OF HYPERTENSION Patient 's caregiver will be able to verbalize understanding of managing hypertension with medications  Outcome: Not Progressing Patient's family has not been present for teaching.

## 2016-10-18 NOTE — Progress Notes (Signed)
Speech Language Pathology Daily Session Note  Patient Details  Name: Osborn CohoJames L Schake MRN: [id number] Date of Birth: 02/09/1941  Today's Date: 10/18/2016 SLP Individual Time: 7829-56210900-0925 SLP Individual Time Calculation (min): 25 min   Short Term Goals: Week 2: SLP Short Term Goal 1 (Week 2): Pt will answer yes/no questions for 75% accuracy with max assist verbal cues.   SLP Short Term Goal 2 (Week 2): Pt will follow 1 step commands for 75% accuracy with max assist verbal cues.   SLP Short Term Goal 3 (Week 2): Pt will consume dys 1 textures and honey thick liquids via teaspoon with mod assist for use of swallowing precautions and minimal overt s/s of aspiration.   SLP Short Term Goal 4 (Week 2): Pt will verbalize at the word level during structured tasks for 50% accuracy with max assist multimodal cues. SLP Short Term Goal 5 (Week 2): Pt will utilize multimodal means of communication to convey needs and wants to caregivers with mod assist cues.    Skilled Therapeutic Interventions:  Pt was seen for skilled ST targeting goals for dysphagia and communication.  Pt awake and sitting up in wheelchair at nursing station upon therapist's arrival.  Pt continues to need hand over hand assist for initiation and sequencing of self feeding during presentations of honey thick liquids via teaspoon.  Pt continues to present with s/s of delayed swallow initiation but no overt s/s of aspiration with thickened liquids.  Pt needed max assist multimodal cues to verbally answer immediate yes/no questions.  Pt was noted with increased fatigue towards the end of today's therapy session and was only able to focus attention to basic stimuli for brief periods of time with mod-max assist multimodal cues.  As a result, session was ended early.  Pt was left in wheelchair with quick release belt donned at nursing station.  Continue per current plan of care.    Function:  Eating Eating   Modified Consistency Diet: Yes Eating  Assist Level: Help managing cup/glass;Hand over hand assist;Help with picking up utensils;Set up assist for   Eating Set Up Assist For: Opening containers Helper Scoops Food on Utensil: Occasionally Helper Brings Food to Mouth: Occasionally   Cognition Comprehension Comprehension assist level: Understands basic 25 - 49% of the time/ requires cueing 50 - 75% of the time  Expression   Expression assist level: Expresses basis less than 25% of the time/requires cueing >75% of the time.  Social Interaction Social Interaction assist level: Interacts appropriately 25 - 49% of time - Needs frequent redirection.  Problem Solving Problem solving assist level: Solves basic 25 - 49% of the time - needs direction more than half the time to initiate, plan or complete simple activities  Memory Memory assist level: Recognizes or recalls less than 25% of the time/requires cueing greater than 75% of the time    Pain Pain Assessment Pain Assessment: No/denies pain  Therapy/Group: Individual Therapy  Yohannes Waibel, Melanee SpryNicole L 10/18/2016, 11:53 AM

## 2016-10-18 NOTE — Progress Notes (Signed)
Hematology follow up note   Today's lab reviewed, Hb dropped from 9.0 yesterday to 7.5 this morning. I spoke with pt's floor nurse, no overt GI or other bleeding, he had one episode of BM this morning, no bleeding reported.  His prior hemolysis lab was negative.   I spoke with GI Dr. Alessandra Bevels who saw pt last week. He will ask his partner Dr. Watt Climes to follow him in the hospital and discuss endoscopy work up.   Please repeat CBC with ret tomorrow morning, if Hb<8.0 please give 2U RBC transfusion.   I recommend to stop aspirin, due to possible GI bleeding, if OK with neurology team.   Sorry that I was not able to see pt today, will follow up tomorrow.   Truitt Merle  10/18/2016

## 2016-10-18 NOTE — Progress Notes (Signed)
Social Work [name], LCSW Social Worker Signed   Patient Care Conference Date of Service: 10/18/2016  1:16 PM      Hide copied text Hover for attribution information Inpatient RehabilitationTeam Conference and Plan of Care Update Date: 10/18/2016   Time: 11:00 AM      Patient Name: Douglas [name]      Medical Record Number: [id number]  Date of Birth: [birth year] Sex: Male         Room/Bed: 4W08C/4W08C-01 Payor Info: Payor: MEDICARE / Plan: MEDICARE PART A AND B / Product Type: *No Product type* /     Admitting Diagnosis: CVA  Admit Date/Time:  10/05/2016  6:35 PM Admission Comments: No comment available    Primary Diagnosis:  <principal problem not specified> Principal Problem: <principal problem not specified>       Patient Active Problem List    Diagnosis Date Noted  . HCAP (healthcare-associated pneumonia)    . Sepsis (HCC)    . AKI (acute kidney injury) (HCC)    . Erythema    . Acute lower UTI    . Dysphagia due to recent cerebral infarction    . Hypoalbuminemia due to protein-calorie malnutrition (HCC)    . Thrombocytopenia (HCC)    . Leukocytosis    . Acute blood loss anemia    . Type 2 diabetes mellitus with peripheral neuropathy (HCC)    . Labile blood glucose    . 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 Right hemiparesis (HCC)    . Urinary retention    . Acute delirium    . CKD (chronic kidney disease) 09/27/2016  . Elevated troponin 09/27/2016  . Anemia 09/27/2016  . Acute embolic stroke (HCC)    . Acute systolic heart failure (HCC)    . Benign essential HTN    . Tachycardia    . CVA (cerebral vascular accident) (HCC) 09/26/2016  . Hyperlipidemia 11/30/2013  . Essential hypertension 11/30/2013  . DM (diabetes mellitus) (HCC) 11/30/2013      Expected Discharge Date: Expected Discharge Date: 11/03/16   Team Members Present: Physician leading conference: Dr. Claudette Laws Social Worker Present: Dossie Der, LCSW Nurse Present:  Carmie End, RN PT Present: Teodoro Kil, PT OT Present: Rosalio Loud, OT SLP Present: Jackalyn Lombard, SLP PPS Coordinator present : Tora Duck, RN, CRRN       Current Status/Progress Goal Weekly Team Focus  Medical   Anemia, UTI resolved, thrombocytopenia resolved, still with severe aphasia, right-sided hemiparesis, poor intake  Maintain medical stability, monitor hemoglobin,  Monitor for further blood loss, establish need for repeat transfusion   Bowel/Bladder   Bowel incontinence---LBM 10/17/2016; Foley catheter for bladder  Pt will manage bowel with min assist; Pt will manage bladder with foley catheter with total assist  Monitor for changes in bowel continence and toilet; maintain and assess for need of foley catheter   Swallow/Nutrition/ Hydration   Downgraded to Dys 1, honey thick liquids per MBS   min assist   toleration of diet   ADL's   min assist with max demonstrational cueing for bathing/dressing, max to +2 for LB bathing/dressing, cues to incorporate use of RUE into functional tasks  min to mod assist   ADL retraining, balance retraining, midline orientation, Rt attention, NMR   Mobility   max assist overall  overall mod assist  cognition, awareness, attention, NMR, postural control, midline orientation, transfers, ambulation, w/c propulsion   Communication   max to total assist for functional communication  min-mod assist   continue to address automatic sequqnces, yes/no response accuracy, multimodal communication    Safety/Cognition/ Behavioral Observations decreased focused and sustained attention to tasks, continues to be limited by poor endurance, max to total assist   min assist   attention, arousal    Pain   Pt does not complain of any pain; Faces=0  Pt will maintain current pain status  Monitor for signs of pain; Use Faces scale   Skin   Scattered bruising to body; dry skin; MASD to anus  Pt will be free of skin breakdown  Monitor for changes in skin integrity; manage  MASD with barrier cream       *See Care Plan and progress notes for long and short-term goals.   Barriers to Discharge: The factorial anemia, and does have positive occult blood in stool. Has required transfusion last week.   Possible Resolutions to Barriers:  Hematology oncology consult, consider internal medicine consult   Discharge Planning/Teaching Needs:  Plan for pt to go to a NH once medically stable and able to be one person care. Niece and Nephew and roommate visit when they can-all work      Team Discussion:  Making good progress in PT-ambulated 100 ft with mod assist level. Doing well in OT also still max assist level. Diet downgraded to honey thick after MBS last week. IV's in pm-MD to see if can discharge and see if can hydrate himself. Hemoglobin dropping again-have GI come back and may need transfusion. Incontinent of bowel and still has foley. MD reports medical issues [facility]   Revisions to Treatment Plan:  Downgraded diet to honey thick liquids after MBS    Continued Need for Acute Rehabilitation Level of Care: The patient requires daily medical management by a physician with specialized training in physical medicine and rehabilitation for the following conditions: Daily direction of a multidisciplinary physical rehabilitation program to ensure safe treatment while eliciting the highest outcome that is of practical value to the patient.: Yes Daily medical management of patient stability for increased activity during participation in an intensive rehabilitation regime.: Yes Daily analysis of laboratory values and/or radiology reports with any subsequent need for medication adjustment of medical intervention for : Neurological problems;Other;Nutritional problems   [name], Josearmando Kuhnert G 10/18/2016, 3:30 PM      Lucy Chrisebecca G Lakeyia Surber, LCSW Social Worker Signed   Patient Care Conference Date of Service: 10/11/2016  1:32 PM      Hide copied text Hover  for attribution information Inpatient RehabilitationTeam Conference and Plan of Care Update Date: 10/11/2016   Time: 10:00 AM      Patient Name: Douglas [name] CohoJames L Guedea      Medical Record Number: [id number]  Date of Birth: 11/12/1940 Sex: Male         Room/Bed: 4W08C/4W08C-01 Payor Info: Payor: MEDICARE / Plan: MEDICARE PART A AND B / Product Type: *No Product type* /     Admitting Diagnosis: CVA  Admit Date/Time:  10/05/2016  6:35 PM Admission Comments: No comment available    Primary Diagnosis:  <principal problem not specified> Principal Problem: <principal problem not specified>       Patient Active Problem List    Diagnosis Date Noted  . Erythema    . Acute lower UTI    . Dysphagia due to recent cerebral infarction    . Hypoalbuminemia due to protein-calorie malnutrition (HCC)    . Thrombocytopenia (HCC)    . Leukocytosis    .  Acute blood loss anemia    . Type 2 diabetes mellitus with peripheral neuropathy (HCC)    . Labile blood glucose    . 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 Right hemiparesis (HCC)    . Urinary retention    . Acute delirium    . CKD (chronic kidney disease) 09/27/2016  . Elevated troponin 09/27/2016  . Anemia 09/27/2016  . Acute embolic stroke (HCC)    . Acute systolic heart failure (HCC)    . Benign essential HTN    . Tachycardia    . CVA (cerebral vascular accident) (HCC) 09/26/2016  . Hyperlipidemia 11/30/2013  . Essential hypertension 11/30/2013  . DM (diabetes mellitus) (HCC) 11/30/2013      Expected Discharge Date: Expected Discharge Date: 11/03/16   Team Members Present: Physician leading conference: Dr. Maryla Morrow Social Worker Present: Dossie Der, LCSW Nurse Present: Chana Bode, RN PT Present: Teodoro Kil, PT OT Present: Roney Mans, OT SLP Present: Jackalyn Lombard, SLP PPS Coordinator present : Tora Duck, RN, CRRN       Current Status/Progress Goal Weekly Team Focus  Medical   Right-sided  weakness, aphasia, dysphagia secondary to left MCA infarct  Decrease burden of care, improve mobility, cognition, severe ABLA, thrombocytopenia, leukocytosis  See above   Bowel/Bladder   incont of bowel, managed bladder with foley  manage bowel with min assist and total assist of bladder wtih foley  moniitor need for /use laxatives for bowel movement, using briefs for management   Swallow/Nutrition/ Hydration   Dys 1, nectar thick liquids; full supervision   min assist   MBS, toleration of current diet, trials of advanced consistencies to continue working towards diet progression   ADL's     total assist of 2   mod/max assist   awareness, transfers, standing, ADL's  Mobility   total assist>+2 assist  overall mod assist  cognition, awareness, attention, NMR, postural control, transfers, standing   Communication   globally aphasic, max to total assist for functional communication, also limited by lethargy   min-mod assist   automatic sequences, yes/no response accuracy, multimodal communication    Safety/Cognition/ Behavioral Observations limited due to lethargy, max to total assist for basic tasks  min assist   attention, arousal   Pain   n/a         Skin   bruising scattered over body, dry flaky pale skin  bruising improving, no skin integrity breakdown  turn q 2 and monitor skin each shift       *See Care Plan and progress notes for long and short-term goals.   Barriers to Discharge: ABLA, thrombocytopenia, leukocytosis, UTI, global aphasia, DM   Possible Resolutions to Barriers:  GI consult, follow labs, may need Heme/Onc consult, therapies, decrease burden of care, optimize DM meds   Discharge Planning/Teaching Needs:  Pt does not have 24 hr care at home, will need to go to a NH when medically ready for DC from CIR. Niece and nephew are supportive along with roommate      Team Discussion:  Goals max-total of one person, currently plus two total assist. Pushes to the right. Globally  aphasic. MBS tomorrow to see if can advance diet. GI consulted due to low hemoglobin-blood in stools. Limited by fatigue-needs multiple rest breaks. Will be NHP when medically ready to transfer from here, no caregivers.  Revisions to Treatment Plan:  None    Continued Need for Acute Rehabilitation Level of Care: The patient requires daily medical  management by a physician with specialized training in physical medicine and rehabilitation for the following conditions: Daily direction of a multidisciplinary physical rehabilitation program to ensure safe treatment while eliciting the highest outcome that is of practical value to the patient.: Yes Daily medical management of patient stability for increased activity during participation in an intensive rehabilitation regime.: Yes Daily analysis of laboratory values and/or radiology reports with any subsequent need for medication adjustment of medical intervention for : Neurological problems;Diabetes problems;Other;Nutritional problems   [name] 10/11/2016, 2:33 PM       Patient ID: Douglas [name] Coho, male   DOB: 10/12/1941, 76 y.o.   MRN: [id number]

## 2016-10-19 ENCOUNTER — Inpatient Hospital Stay (HOSPITAL_COMMUNITY): Payer: Medicare Other

## 2016-10-19 ENCOUNTER — Inpatient Hospital Stay (HOSPITAL_COMMUNITY): Payer: Medicare Other | Admitting: Speech Pathology

## 2016-10-19 ENCOUNTER — Inpatient Hospital Stay (HOSPITAL_COMMUNITY): Payer: Medicare Other | Admitting: Occupational Therapy

## 2016-10-19 ENCOUNTER — Inpatient Hospital Stay (HOSPITAL_COMMUNITY): Payer: Medicare Other | Admitting: Physical Therapy

## 2016-10-19 LAB — CBC
HEMATOCRIT: 24.7 % — AB (ref 39.0–52.0)
HEMOGLOBIN: 7.6 g/dL — AB (ref 13.0–17.0)
MCH: 27.2 pg (ref 26.0–34.0)
MCHC: 30.8 g/dL (ref 30.0–36.0)
MCV: 88.5 fL (ref 78.0–100.0)
Platelets: 128 10*3/uL — ABNORMAL LOW (ref 150–400)
RBC: 2.79 MIL/uL — AB (ref 4.22–5.81)
RDW: 23.2 % — ABNORMAL HIGH (ref 11.5–15.5)
WBC: 9.5 10*3/uL (ref 4.0–10.5)

## 2016-10-19 LAB — GLUCOSE, CAPILLARY
GLUCOSE-CAPILLARY: 215 mg/dL — AB (ref 65–99)
GLUCOSE-CAPILLARY: 132 mg/dL — AB (ref 65–99)
Glucose-Capillary: 113 mg/dL — ABNORMAL HIGH (ref 65–99)
Glucose-Capillary: 213 mg/dL — ABNORMAL HIGH (ref 65–99)
Glucose-Capillary: 184 mg/dL — ABNORMAL HIGH (ref 65–99)

## 2016-10-19 LAB — PREPARE RBC (CROSSMATCH)

## 2016-10-19 MED ORDER — PANTOPRAZOLE SODIUM 40 MG PO PACK
40.0000 mg | PACK | Freq: Two times a day (BID) | ORAL | Status: DC
  Administered 2016-10-19 – 2016-10-31 (×24): 40 mg
  Filled 2016-10-19 (×12): qty 20

## 2016-10-19 MED ORDER — ACETAMINOPHEN 325 MG PO TABS
650.0000 mg | ORAL_TABLET | Freq: Once | ORAL | Status: AC
  Administered 2016-10-19: 650 mg via ORAL
  Filled 2016-10-19: qty 2

## 2016-10-19 MED ORDER — SODIUM CHLORIDE 0.9 % IV SOLN
Freq: Once | INTRAVENOUS | Status: AC
  Administered 2016-10-19: 16:00:00 via INTRAVENOUS

## 2016-10-19 NOTE — Progress Notes (Signed)
Occupational Therapy Session Note  Patient Details  Name: Douglas CohoJames L Stanczyk MRN: [id number] Date of Birth: 03/06/1941  Today's Date: 10/19/2016 OT Individual Time: [id number] OT Individual Time Calculation (min): 62 min     Short Term Goals: Week 2:  OT Short Term Goal 1 (Week 2): Pt will maintain unsupported sitting balance with min assist for 2 mins to engage in self-care task OT Short Term Goal 2 (Week 2): Pt will transfer to toilet with max assist of 1 caregiver OT Short Term Goal 3 (Week 2): Pt will complete sit > stand with max assist of one caregiver to progress towards LB dressing at sit > stand level OT Short Term Goal 4 (Week 2): Pt will complete LB dressing with max assist   Skilled Therapeutic Interventions/Progress Updates:    Treatment session with focus on transfers, sit <> stand, midline orientation, Rt attention, and increased participation in self-care tasks of bathing and dressing in more familiar context of shower.  Pt completed bed mobility with min assist, then mod assist for stand pivot transfer to Rt to w/c.  Stand pivot into walk-in shower with mod assist and use of grab bars to decrease pushing tendencies and increase sequence of transfer.  Noted pt to demonstrate increased sequencing and initiation during familiar shower setup, still requiring mod assist for sequencing initially.  Noted ideational apraxia with hand held shower wand, requiring cues and hand over hand to increase success.  Min assist standing with UE support on grab bar while therapist washed buttocks.  Max assist transfer out of shower to pt's Lt due to pushing and decreased sequencing.  Sit > stand at sink to utilize mirror for visual input for standing posture while completing LB dressing.  Pt demonstrating increased pushing to Rt while in standing and attempting to pull pants over hips, requiring +2 for stability and to assist with clothing management.  Setup for breakfast with pt attempting to utilize Rt  hand for self-feeding but demonstrating decreased visual attention and awareness of UE, dragging hand through eggs and then dropping fork with no awareness.  Passed pt off to nurse tech to provide supervision/assistance with breakfast tray.  Therapy Documentation Precautions:  Precautions Precautions: Fall Precaution Comments: R side inattention, strong push to R side, posterior lean Restrictions Weight Bearing Restrictions: No Vital Signs: Therapy Vitals Temp: 98.2 F (36.8 C) Temp Source: Axillary Pain:  Pt with no c/o pain  See Function Navigator for Current Functional Status.   Therapy/Group: Individual Therapy  Rosalio LoudHOXIE, Sareen Randon 10/19/2016, 9:37 AM

## 2016-10-19 NOTE — Progress Notes (Signed)
Subjective/Complaints:  Patient participating in OT, was able to shower without complaints of fatigue  , discussed with OT             Unable to obtain ROS due to aphasia   Objective: Vital Signs: Blood pressure (!) 118/58, pulse (!) 102, temperature 98.2 F (36.8 C), temperature source Axillary, resp. rate (!) 22, weight 69 kg (152 lb 3.2 oz), SpO2 97 %. No results found. Results for orders placed or performed during the hospital encounter of 10/05/16 (from the past 72 hour(s))  Glucose, capillary     Status: Abnormal   Collection Time: 10/16/16 11:47 AM  Result Value Ref Range   Glucose-Capillary 177 (H) 65 - 99 mg/dL  Basic metabolic panel     Status: Abnormal   Collection Time: 10/16/16  1:44 PM  Result Value Ref Range   Sodium 141 135 - 145 mmol/L   Potassium 3.2 (L) 3.5 - 5.1 mmol/L   Chloride 108 101 - 111 mmol/L   CO2 24 22 - 32 mmol/L   Glucose, Bld 132 (H) 65 - 99 mg/dL   BUN 18 6 - 20 mg/dL   Creatinine, Ser 1.32 (H) 0.61 - 1.24 mg/dL   Calcium 8.4 (L) 8.9 - 10.3 mg/dL   GFR calc non Af Amer 51 (L) >60 mL/min   GFR calc Af Amer 59 (L) >60 mL/min    Comment: (NOTE) The eGFR has been calculated using the CKD EPI equation. This calculation has not been validated in all clinical situations. eGFR's persistently <60 mL/min signify possible Chronic Kidney Disease.    Anion gap 9 5 - 15  Glucose, capillary     Status: Abnormal   Collection Time: 10/16/16  4:30 PM  Result Value Ref Range   Glucose-Capillary 160 (H) 65 - 99 mg/dL  Glucose, capillary     Status: None   Collection Time: 10/16/16  9:00 PM  Result Value Ref Range   Glucose-Capillary 76 65 - 99 mg/dL  Glucose, capillary     Status: Abnormal   Collection Time: 10/17/16  6:29 AM  Result Value Ref Range   Glucose-Capillary 142 (H) 65 - 99 mg/dL   Comment 1 Notify RN   Glucose, capillary     Status: Abnormal   Collection Time: 10/17/16 11:33 AM  Result Value Ref Range   Glucose-Capillary 255 (H) 65 - 99  mg/dL  CBC     Status: Abnormal   Collection Time: 10/17/16  3:34 PM  Result Value Ref Range   WBC 14.5 (H) 4.0 - 10.5 K/uL   RBC 3.34 (L) 4.22 - 5.81 MIL/uL   Hemoglobin 9.0 (L) 13.0 - 17.0 g/dL   HCT 29.1 (L) 39.0 - 52.0 %   MCV 87.1 78.0 - 100.0 fL   MCH 26.9 26.0 - 34.0 pg   MCHC 30.9 30.0 - 36.0 g/dL   RDW 23.2 (H) 11.5 - 15.5 %   Platelets 163 150 - 400 K/uL  Glucose, capillary     Status: Abnormal   Collection Time: 10/17/16  4:49 PM  Result Value Ref Range   Glucose-Capillary 202 (H) 65 - 99 mg/dL  Glucose, capillary     Status: Abnormal   Collection Time: 10/17/16  8:57 PM  Result Value Ref Range   Glucose-Capillary 116 (H) 65 - 99 mg/dL   Comment 1 Notify RN   Glucose, capillary     Status: Abnormal   Collection Time: 10/18/16  6:19 AM  Result Value Ref Range  Glucose-Capillary 118 (H) 65 - 99 mg/dL  CBC     Status: Abnormal   Collection Time: 10/18/16  7:11 AM  Result Value Ref Range   WBC 11.5 (H) 4.0 - 10.5 K/uL   RBC 2.91 (L) 4.22 - 5.81 MIL/uL   Hemoglobin 7.5 (L) 13.0 - 17.0 g/dL    Comment: REPEATED TO VERIFY   HCT 25.1 (L) 39.0 - 52.0 %   MCV 86.3 78.0 - 100.0 fL   MCH 25.8 (L) 26.0 - 34.0 pg   MCHC 29.9 (L) 30.0 - 36.0 g/dL   RDW 23.0 (H) 11.5 - 15.5 %   Platelets 167 150 - 400 K/uL  Glucose, capillary     Status: Abnormal   Collection Time: 10/18/16 11:51 AM  Result Value Ref Range   Glucose-Capillary 192 (H) 65 - 99 mg/dL  Glucose, capillary     Status: Abnormal   Collection Time: 10/18/16  4:59 PM  Result Value Ref Range   Glucose-Capillary 130 (H) 65 - 99 mg/dL  Glucose, capillary     Status: Abnormal   Collection Time: 10/18/16  8:58 PM  Result Value Ref Range   Glucose-Capillary 181 (H) 65 - 99 mg/dL  CBC     Status: Abnormal   Collection Time: 10/19/16  5:47 AM  Result Value Ref Range   WBC 9.5 4.0 - 10.5 K/uL   RBC 2.79 (L) 4.22 - 5.81 MIL/uL   Hemoglobin 7.6 (L) 13.0 - 17.0 g/dL   HCT 24.7 (L) 39.0 - 52.0 %   MCV 88.5 78.0 - 100.0  fL   MCH 27.2 26.0 - 34.0 pg   MCHC 30.8 30.0 - 36.0 g/dL   RDW 23.2 (H) 11.5 - 15.5 %   Platelets 128 (L) 150 - 400 K/uL  Glucose, capillary     Status: Abnormal   Collection Time: 10/19/16  6:43 AM  Result Value Ref Range   Glucose-Capillary 113 (H) 65 - 99 mg/dL      General: NAD. Vital signs reviewed.  Psych: Unable to assess due to aphasia Heart: RRR. Lungs: CTA bilaterally. Abdomen: Positive bowel sounds, soft  Skin: Erythema on dorsum of left foot.    Neurologic: Alert Expressive language deficits, garbled speech Inconsistently following commands, moving b/l UE and LLE.  Follows some simple commands Unable to assess MMT and sensation due to language Musculoskeletal: No edema. No tenderness.  Assessment/Plan: 1. 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 Physiatrist is providing close team supervision and 24 hour management of active medical problems listed below. Physiatrist and rehab team continue to assess barriers to discharge/monitor patient progress toward functional and medical goals. FIM: Function - Bathing Position: Wheelchair/chair at sink Body parts bathed by patient: Right arm, Left arm, Chest, Abdomen, Right upper leg, Left upper leg, Front perineal area Body parts bathed by helper: Buttocks, Right lower leg, Left lower leg, Back Bathing not applicable: Right lower leg, Left lower leg Assist Level: 2 helpers  Function- Upper Body Dressing/Undressing What is the patient wearing?: Pull over shirt/dress Pull over shirt/dress - Perfomed by patient: Pull shirt over trunk, Thread/unthread left sleeve, Put head through opening Pull over shirt/dress - Perfomed by helper: Thread/unthread right sleeve Assist Level: Supervision or verbal cues Function - Lower Body Dressing/Undressing What is the patient wearing?:  Underwear, Non-skid slipper socks, Ted Hose Position: Wheelchair/chair at [employer] - Performed by  helper: Thread/unthread right underwear leg, Thread/unthread left underwear leg, Pull underwear up/down Pants- Performed by patient: Pull pants up/down Pants- Performed by helper: Thread/unthread right pants leg, Thread/unthread left pants leg, Pull pants up/down Non-skid slipper socks- Performed by patient: Don/doff left sock Non-skid slipper socks- Performed by helper: Don/doff right sock, Don/doff left sock TED Hose - Performed by helper: Don/doff right TED hose, Don/doff left TED hose Assist for footwear: Maximal assist Assist for lower body dressing: 2 Helpers  Function - Toileting Toileting activity did not occur: No continent bowel/bladder event Toileting steps completed by helper: Adjust clothing prior to toileting, Performs perineal hygiene, Adjust clothing after toileting Assist level: Two helpers  Function - Air cabin crew transfer activity did not occur: N/A Toilet transfer assistive device: Bedside commode Assist level to toilet: 2 helpers Assist level from toilet: 2 helpers Assist level to bedside commode (at bedside): 2 helpers Assist level from bedside commode (at bedside): 2 helpers  Function - Chair/bed transfer Chair/bed transfer activity did not occur: Safety/medical concerns Chair/bed transfer method: Stand pivot Chair/bed transfer assist level: Moderate assist (Pt 50 - 74%/lift or lower) Chair/bed transfer assistive device: Walker Mechanical lift: Stedy Chair/bed transfer details: Manual facilitation for weight shifting, Tactile cues for posture, Verbal cues for sequencing, Verbal cues for technique, Manual facilitation for placement  Function - Locomotion: Wheelchair Will patient use wheelchair at discharge?: Yes Type: Manual Wheelchair activity did not occur: Safety/medical concerns Max wheelchair distance: 100 Assist Level: Total assistance (Pt  < 25%) Wheel 50 feet with 2 turns activity did not occur: Safety/medical concerns Assist Level: Total assistance (Pt < 25%) Wheel 150 feet activity did not occur: Safety/medical concerns Turns around,maneuvers to table,bed, and toilet,negotiates 3% grade,maneuvers on rugs and over doorsills: No Function - Locomotion: Ambulation Ambulation activity did not occur: Safety/medical concerns Assistive device: Walker-rolling Max distance: 12f  Assist level: Moderate assist (Pt 50 - 74%) Walk 10 feet activity did not occur: Safety/medical concerns Assist level: Moderate assist (Pt 50 - 74%) Walk 50 feet with 2 turns activity did not occur: Safety/medical concerns Assist level: Moderate assist (Pt 50 - 74%) Walk 150 feet activity did not occur: Safety/medical concerns Walk 10 feet on uneven surfaces activity did not occur: Safety/medical concerns  Function - Comprehension Comprehension: Auditory Comprehension assist level: Understands basic 25 - 49% of the time/ requires cueing 50 - 75% of the time  Function - Expression Expression: Nonverbal Expression assist level: Expresses basis less than 25% of the time/requires cueing >75% of the time.  Function - Social Interaction Social Interaction assist level: Interacts appropriately 25 - 49% of time - Needs frequent redirection.  Function - Problem Solving Problem solving assist level: Solves basic 25 - 49% of the time - needs direction more than half the time to initiate, plan or complete simple activities  Function - Memory Memory assist level: Recognizes or recalls less than 25% of the time/requires cueing greater than 75% of the time Patient normally able to recall (first 3 days only): That he or she is in a hospital   Medical Problem List and Plan: 1.  Right-sided weakness, aphasia, dysphagia secondary to left MCA infarct  Cont CIR, PT, OT, speech, reduced burden of care ?Pt wants DNR per nursing , patient globally aphasic, so difficult  to assess this actually, to discuss with family--remains FULL CODE at present 2.  DVT Prophylaxis/Anticoagulation: Subcutaneous Lovenox, holding, plt ct recovering, stop, baby aspirin, given ongoing GI bleeding. Appreciate hematology and GI notes  Monitor platelet  counts and any signs of bleeding  SCDs ordered 3. Pain Management: Tylenol as needed 4. Dysphagia. Dysphagia #1 nectar liquids. Monitor for any signs of aspiration.   Added daily at bedtime IV fluids 5. Neuropsych: This patient is capable of making decisions on his own behalf. 6. Skin/Wound Care: Routine skin checks 7. Fluids/Electrolytes/Nutrition: Routine I&Os 8. Aspiration pneumonia. Augmentin completed. 9. Diabetes mellitus with peripheral neuropathy. Hemoglobin A1c 7.2.   Check blood sugars before meals and at bedtime  Currently Lantus insulin 16 units daily . A.m. CBGs are in desired range  Novolog 4U with meals added 12/25, increased to 8U on 12/26, increased to [place] on 12/27  CBG's higher in mid day to PM.--increased lantus to 20u q 0800 on 12/30 Consider BID dosing as well CBG (last 3)   Recent Labs  10/18/16 1659 10/18/16 2058 10/19/16 0643  GLUCAP 130* 181* 113*    10. Hypertension/tachycardia. Lopressor 12.5 mg twice a day. Monitor heart rate with increased mobility  Overall controlled 12/29 11. Stage III chronic kidney disease. Baseline creatinine 1.62. Follow-up chemistries, Currently at baseline  BMP Latest Ref Rng & Units 10/16/2016 10/14/2016 10/12/2016  Glucose 65 - 99 mg/dL 132(H) 112(H) -  BUN 6 - 20 mg/dL 18 16 -  Creatinine 0.61 - 1.24 mg/dL 1.32(H) 1.32(H) 1.44(H)  Sodium 135 - 145 mmol/L 141 139 -  Potassium 3.5 - 5.1 mmol/L 3.2(L) 3.4(L) -  Chloride 101 - 111 mmol/L 108 106 -  CO2 22 - 32 mmol/L 24 23 -  Calcium 8.9 - 10.3 mg/dL 8.4(L) 8.4(L) -   12. Mood. Depakote 250 mg BID added for bouts of agitation and restlessness. 13. Hypothyroidism. Synthroid 14. Hyperlipidemia. Lipitor 15. Anemia,  acute blood loss. Also with pancytopenia related to infection, now improving. Appreciate Hemoccult     Hemoccult + 12/25  Cont to monitor serially  GI consulted, spoke to GI with plans for EGD, family refused, spoke with GI again, cont PPI  Heme/Onc consulted - ?DIC vs microscopic bleed, Heme/Onc on consult,  , Transfuse 2 units as recommended by hematology, stop, baby aspirin 16.  Leukocytosis:    afebrile x 9d  CXR reviewed 12/22 with stable infiltrates, cont to monitor  WBCs stable 15.1   Foley replaced   1/1 CXR reviewed: basilar opacities unchanged RML,D/CVanc/Zosyn  17. Thrombocytopenia:   lovenox held. Platelets normalized per Heme may resume antiplt but will hold off due to hgb still slowly dropping  SRA negative  Will cont to monitor 18. Hypoalbuminemia  Supplement started 12/23 19. Acute lower UTI-resolved  Foley to stay in place, difficult placement with tight foreskin.   Urology consult as outpt    20. CKD  At baseline  Cont to monitor  HypoK will supplement  Will cont to monitor   -HS IVF  LOS (Days) 14 A FACE TO FACE EVALUATION WAS PERFORMED  Coty Larsh E 10/19/2016, 9:16 AM

## 2016-10-19 NOTE — Progress Notes (Signed)
Social Work Patient ID: Douglas [name], male   DOB: 08/09/1941, 76 y.o.   MRN: [id number]  Spoke with [name]-niece per telephone who reports she is snowed in and will not be able to see him today. Gave her update regarding team conference and the progress pt has made in therapies last week and this week. She is concerned about his hemoglobin and the fact it keeps dropping. She doesn't feel He will be able to move to the next venue until this is resolved or a procedure done, but doesn't want him to have a procedure that has a high risk for pt. She will talk with pt's RN regarding her medical questions. Aware pt will need to be medically stable before going to the next venue of care. Will continue to work on discharge and [facility] once MD feels he is medically stable for transfer.

## 2016-10-19 NOTE — Progress Notes (Signed)
Physical Therapy Note  Patient Details  Name: Douglas [name] MRN: [id number] Date of Birth: 04/25/1941 Today's Date: 10/19/2016    Attempted to see pt for scheduled therapy session at 1430.  Pt sleeping soundly, able to be aroused briefly with tactile cues but unable to stay awake.  Returned at 1500 and pt continued to be sleeping soundly.  Will f/u at next scheduled appointment.    Abagail Limb Penven-Crew, PT, DPT  10/19/2016, 3:12 PM

## 2016-10-19 NOTE — Progress Notes (Signed)
Physical Therapy Note  Patient Details  Name: Douglas [name] MRN: [id number] Date of Birth: 10/02/1941 Today's Date: 10/19/2016  0905-1005, 60 min individual tx Pain: none reported  Pt eating breakfast with NT.  With hand over hand assistance, pt fed himself using R/L hand with spoon, for eggs, grits and honey thick OJ.  Pt attyempted to put too much food or liquid on spoon.  He coughed intermittently.  sSction used when pt communicated that he was finished. W/c propulsion with intermittent assistance for steering, using hemi method on straight path x 50' x 2 using cones for visual targets, in recliner w/c with pillow behind back to improve position of LLE for steering.  Slide board level transfer to L with mod assist, extra time; pt assisted with wt shifting, use of L hand; to r required +2 as pt appeared to have motor planning problems with wt shift to direct hips.  W/c footrests adjusted to distribute pressure through bil thighs and bottom for skin safety and comfort.  Pt left resting in w/c at nurse's station for supervision, with quick release belt applied and all needs within reach.  See function navigator for current status  Bridgette Wolden 10/19/2016, 7:56 AM

## 2016-10-19 NOTE — Progress Notes (Addendum)
Douglas AlimentJames L [name]   DOB:09/30/1941   GU#:[id number]   ZDG#:[id number]#:[id number]  Hematology follow up  Subjective: Pt was sleeping when I saw him today, had to wake him up. I spoke with his nurse, and was informed that he does usually sleep around dinner time, and eats dinner around 8pm. He has been tolerating PT well. According records, he has brown stool yesterday, and a normal bowel movement early this morning. No clinical signs of bleeding. He was getting blood transfusion this afternoon. Aspirin has been stopped.   Objective:  Vitals:   10/19/16 2145 10/19/16 2151  BP: (!) 155/70 (!) 155/70  Pulse: 99 99  Resp: (!) 22   Temp: 98.2 F (36.8 C)     Body mass index is 23.53 kg/m.  Intake/Output Summary (Last 24 hours) at 10/19/16 2251 Last data filed at 10/19/16 1915  Gross per 24 hour  Intake             1105 ml  Output             1050 ml  Net               55 ml     Sclerae unicteric  Oropharynx clear  No peripheral adenopathy  Lungs clear -- no rales or rhonchi  Heart regular rate and rhythm  Abdomen benign  no peripheral edema     CBG (last 3)   Recent Labs  10/19/16 1447 10/19/16 1657 10/19/16 2156  GLUCAP 213* 184* 132*     Labs:  Lab Results  Component Value Date   WBC 9.5 10/19/2016   HGB 7.6 (L) 10/19/2016   HCT 24.7 (L) 10/19/2016   MCV 88.5 10/19/2016   PLT 128 (L) 10/19/2016   NEUTROABS 14.0 (H) 10/11/2016   CMP Latest Ref Rng & Units 10/16/2016 10/14/2016 10/13/2016  Glucose 65 - 99 mg/dL 951(O132(H) 841(Y112(H) -  BUN 6 - 20 mg/dL 18 16 -  Creatinine 6.060.61 - 1.24 mg/dL 3.01(S1.32(H) 0.10(X1.32(H) -  Sodium 135 - 145 mmol/L 141 139 -  Potassium 3.5 - 5.1 mmol/L 3.2(L) 3.4(L) -  Chloride 101 - 111 mmol/L 108 106 -  CO2 22 - 32 mmol/L 24 23 -  Calcium 8.9 - 10.3 mg/dL 3.2(T8.4(L) 5.5(D8.4(L) -  Total Protein 6.5 - 8.1 g/dL - - 5.8(L)  Total Bilirubin 0.3 - 1.2 mg/dL - - 2.2(H)  Alkaline Phos 38 - 126 U/L - - 197(H)  AST 15 - 41 U/L - - 50(H)  ALT 17 - 63 U/L - - 38     Urine  Studies No results for input(s): UHGB, CRYS in the last 72 hours.  Invalid input(s): UACOL, UAPR, USPG, UPH, UTP, UGL, UKET, UBIL, UNIT, UROB, ULEU, UEPI, UWBC, URBC, UBAC, CAST, UCOM, [place]ILUA  Basic Metabolic Panel:  Recent Labs Lab 10/14/16 0342 10/16/16 1344  NA 139 141  K 3.4* 3.2*  CL 106 108  CO2 23 24  GLUCOSE 112* 132*  BUN 16 18  CREATININE 1.32* 1.32*  CALCIUM 8.4* 8.4*   GFR Estimated Creatinine Clearance: 49.9 mL/min (by C-G formula based on SCr of 1.32 mg/dL (H)). Liver Function Tests:  Recent Labs Lab 10/13/16 0311  AST 50*  ALT 38  ALKPHOS 197*  BILITOT 2.2*  PROT 5.8*  ALBUMIN 1.9*   No results for input(s): LIPASE, AMYLASE in the last 168 hours. No results for input(s): AMMONIA in the last 168 hours. Coagulation profile No results for input(s): INR, PROTIME in the last 168 hours.  CBC:  Recent Labs Lab 10/15/16 0238 10/16/16 0425 10/17/16 1534 10/18/16 0711 10/19/16 0547  WBC 15.3* 15.1* 14.5* 11.5* 9.5  HGB 8.4* 8.0* 9.0* 7.5* 7.6*  HCT 27.6* 26.0* 29.1* 25.1* 24.7*  MCV 82.6 87.0 87.1 86.3 88.5  PLT 108* 131* 163 167 128*   Cardiac Enzymes: No results for input(s): CKTOTAL, CKMB, CKMBINDEX, TROPONINI in the last 168 hours. BNP: Invalid input(s): POCBNP CBG:  Recent Labs Lab 10/19/16 0643 10/19/16 1211 10/19/16 1447 10/19/16 1657 10/19/16 2156  GLUCAP 113* 215* 213* 184* 132*   D-Dimer No results for input(s): DDIMER in the last 72 hours. Hgb A1c No results for input(s): HGBA1C in the last 72 hours. Lipid Profile No results for input(s): CHOL, HDL, LDLCALC, TRIG, CHOLHDL, LDLDIRECT in the last 72 hours. Thyroid function studies No results for input(s): TSH, T4TOTAL, T3FREE, THYROIDAB in the last 72 hours.  Invalid input(s): FREET3 Anemia work up No results for input(s): VITAMINB12, FOLATE, FERRITIN, TIBC, IRON, RETICCTPCT in the last 72 hours. Microbiology No results found for this or any previous visit (from the past  240 hour(s)).    Studies:  No results found.  Assessment: 76 y.o. male with history of diabetes mellitus,Remote tobacco and alcohol use, hypertension, hyperlipidemia, chronic kidney disease who was recently admitted on 09/26/2016 for left MCA ischemic stroke. He has residual severe aphasia and right-sided weakness  1. Recent left MCA ischemic stroke  2. Worsening anemia, iron deficient anemia, likely GI bleeding  3. thrombocytopenia,  Recurred again  4. DM 5. Hyperlipidemia 6. Right pneumonia, on cipro 12/24-12/27, changed to vanco and zosyn on 10/11/16 7. Citrobacter UTI    Recommendations: -His anemia got worse again since yesterday, no overt bleeding, pt declined GI endoscopy -prior anemia work up was negative for HIT, autoimmune hemolysis and TTP (ADAMTS activity slightly low, not sufficient for TTP) , iron studies showed iron deficiency. -his anemia is likely secondary to iron deficient anemia and DIC from his infection -second dose Feraheme tomorrow, I will order  -I recommended to hold antiplatelet therapy permanently, due to his recurrent anemia likely secondary to small GI bleeding, and recurrent thrombocytopenia. -blood transfusion if Hb<8.0 -please follow up CBC  -I will watch his blood counts, and see him as need     Malachy Mood, MD 10/19/2016

## 2016-10-19 NOTE — Progress Notes (Signed)
Patient seen in hall currently undergoing therapy hemoglobin stable aspirin stopped please see yesterday's note for recommendations and call me this weekend if any other question or problem

## 2016-10-19 NOTE — Progress Notes (Signed)
Speech Language Pathology Daily Session Note  Patient Details  Name: Douglas [name] MRN: [id number] Date of Birth: 08/09/1941  Today's Date: 10/19/2016 SLP Individual Time: 1400-1430 SLP Individual Time Calculation (min): 30 min   Short Term Goals: Week 2: SLP Short Term Goal 1 (Week 2): Pt will answer yes/no questions for 75% accuracy with max assist verbal cues.   SLP Short Term Goal 2 (Week 2): Pt will follow 1 step commands for 75% accuracy with max assist verbal cues.   SLP Short Term Goal 3 (Week 2): Pt will consume dys 1 textures and honey thick liquids via teaspoon with mod assist for use of swallowing precautions and minimal overt s/s of aspiration.   SLP Short Term Goal 4 (Week 2): Pt will verbalize at the word level during structured tasks for 50% accuracy with max assist multimodal cues. SLP Short Term Goal 5 (Week 2): Pt will utilize multimodal means of communication to convey needs and wants to caregivers with mod assist cues.    Skilled Therapeutic Interventions:  Pt was seen for skilled ST targeting communication goals.  Pt was asleep upon arrival but was awakened easily to voice and light touch.  Pt initially shook his head "no" when informed of scheduled therapy session but was eventually agreeable to participating in bedside therapies.  Pt continues to primarily communicate via yes/no responses, either verbally or through head nod/shakes, but makes almost no other attempts to initiate functional communication.  Pt was unable to mimic nonverbal oral motor movements, no vocalization was elicited at the vowel level, and pt was unable to spontaneously generate opposite pairs despite max assist multimodal cues.  Pt was noted with a rough approximation of the word "you" x1 within a phrase closure task with max assist multimodal cues but was unable to replicate production on consecutive trials.  Pt was left in bed with bed alarm set and call bell within reach.  Pt did wave good bye to  therapist upon her departure spontaneously.  Continue per current plan of care.     Function:  Eating Eating   Modified Consistency Diet: Yes Eating Assist Level: Help managing cup/glass;Hand over hand assist;Help with picking up utensils;Set up assist for   Eating Set Up Assist For: Opening containers       Cognition Comprehension Comprehension assist level: Understands basic 25 - 49% of the time/ requires cueing 50 - 75% of the time  Expression   Expression assist level: Expresses basis less than 25% of the time/requires cueing >75% of the time.  Social Interaction Social Interaction assist level: Interacts appropriately 25 - 49% of time - Needs frequent redirection.  Problem Solving Problem solving assist level: Solves basic 25 - 49% of the time - needs direction more than half the time to initiate, plan or complete simple activities  Memory Memory assist level: Recognizes or recalls less than 25% of the time/requires cueing greater than 75% of the time    Pain Pain Assessment Pain Assessment: No/denies pain  Therapy/Group: Individual Therapy  Peggi Yono, Melanee SpryNicole L 10/19/2016, 4:04 PM

## 2016-10-19 NOTE — Progress Notes (Signed)
Speech Language Pathology Daily Session Note  Patient Details  Name: Douglas [name] MRN: [id number] Date of Birth: 11/08/1940  Today's Date: 10/19/2016 SLP Individual Time: 1132-1203 SLP Individual Time Calculation (min): 31 min   Short Term Goals: Week 2: SLP Short Term Goal 1 (Week 2): Pt will answer yes/no questions for 75% accuracy with max assist verbal cues.   SLP Short Term Goal 2 (Week 2): Pt will follow 1 step commands for 75% accuracy with max assist verbal cues.   SLP Short Term Goal 3 (Week 2): Pt will consume dys 1 textures and honey thick liquids via teaspoon with mod assist for use of swallowing precautions and minimal overt s/s of aspiration.   SLP Short Term Goal 4 (Week 2): Pt will verbalize at the word level during structured tasks for 50% accuracy with max assist multimodal cues. SLP Short Term Goal 5 (Week 2): Pt will utilize multimodal means of communication to convey needs and wants to caregivers with mod assist cues.    Skilled Therapeutic Interventions:  Pt was seen for skilled ST targeting dysphagia goals.  Pt was noted with improved alertness today in comparison to recent therapy sessions and was able to sustain his attention to self feeding tasks for ~30 second intervals with min-mod verbal cues for redirection.  Pt needed mod assist verbal and visual cues for use of swallowing precautions and attention to boluses when consuming dys 1 textures and honey thick liquids via teaspoon.  Use of dry spoon was needed intermittently to trigger swallow response in the presence of increased delay and/or oral holding.  No overt s/s of aspiration were evident with solids or liquids.  Pt left in wheelchair with quick release belt donned.  Continue per current plan of care.    Function:  Eating Eating   Modified Consistency Diet: Yes Eating Assist Level: Help managing cup/glass;Hand over hand assist;Help with picking up utensils;Set up assist for   Eating Set Up Assist For:  Opening containers       Cognition Comprehension Comprehension assist level: Understands basic 25 - 49% of the time/ requires cueing 50 - 75% of the time  Expression   Expression assist level: Expresses basis less than 25% of the time/requires cueing >75% of the time.  Social Interaction Social Interaction assist level: Interacts appropriately 25 - 49% of time - Needs frequent redirection.  Problem Solving Problem solving assist level: Solves basic 25 - 49% of the time - needs direction more than half the time to initiate, plan or complete simple activities  Memory Memory assist level: Recognizes or recalls less than 25% of the time/requires cueing greater than 75% of the time    Pain Pain Assessment Pain Assessment: No/denies pain  Therapy/Group: Individual Therapy  Erilyn Pearman, Melanee SpryNicole L 10/19/2016, 12:26 PM

## 2016-10-20 ENCOUNTER — Inpatient Hospital Stay (HOSPITAL_COMMUNITY): Payer: Medicare Other | Admitting: Physical Therapy

## 2016-10-20 ENCOUNTER — Inpatient Hospital Stay (HOSPITAL_COMMUNITY): Payer: Medicare Other | Admitting: Occupational Therapy

## 2016-10-20 ENCOUNTER — Inpatient Hospital Stay (HOSPITAL_COMMUNITY): Payer: Medicare Other

## 2016-10-20 ENCOUNTER — Ambulatory Visit (HOSPITAL_COMMUNITY): Payer: Medicare Other | Admitting: Speech Pathology

## 2016-10-20 LAB — CBC
HEMATOCRIT: 34.9 % — AB (ref 39.0–52.0)
HEMOGLOBIN: 10.9 g/dL — AB (ref 13.0–17.0)
MCH: 26.6 pg (ref 26.0–34.0)
MCHC: 31.2 g/dL (ref 30.0–36.0)
MCV: 85.1 fL (ref 78.0–100.0)
Platelets: 107 10*3/uL — ABNORMAL LOW (ref 150–400)
RBC: 4.1 MIL/uL — AB (ref 4.22–5.81)
RDW: 20.9 % — ABNORMAL HIGH (ref 11.5–15.5)
WBC: 8.6 10*3/uL (ref 4.0–10.5)

## 2016-10-20 LAB — GLUCOSE, CAPILLARY
GLUCOSE-CAPILLARY: 99 mg/dL (ref 65–99)
GLUCOSE-CAPILLARY: 170 mg/dL — AB (ref 65–99)
Glucose-Capillary: 178 mg/dL — ABNORMAL HIGH (ref 65–99)
Glucose-Capillary: 114 mg/dL — ABNORMAL HIGH (ref 65–99)

## 2016-10-20 LAB — TYPE AND SCREEN
Blood Product Expiration Date: 201801182359
Blood Product Expiration Date: 201801182359
ISSUE DATE / TIME: 201801041544
ISSUE DATE / TIME: 201801041840
UNIT TYPE AND RH: 5100
Unit Type and Rh: 5100

## 2016-10-20 MED ORDER — SODIUM CHLORIDE 0.9 % IV SOLN
510.0000 mg | Freq: Once | INTRAVENOUS | Status: AC
  Administered 2016-10-20: 510 mg via INTRAVENOUS
  Filled 2016-10-20 (×2): qty 17

## 2016-10-20 NOTE — Progress Notes (Signed)
Occupational Therapy Session Note  Patient Details  Name: Douglas [name] MRN: [id number] Date of Birth: 09/21/1941  Today's Date: 10/20/2016 OT Individual Time: 1000-1100 OT Individual Time Calculation (min): 60 min     Short Term Goals:Week 2:  OT Short Term Goal 1 (Week 2): Pt will maintain unsupported sitting balance with min assist for 2 mins to engage in self-care task OT Short Term Goal 2 (Week 2): Pt will transfer to toilet with max assist of 1 caregiver OT Short Term Goal 3 (Week 2): Pt will complete sit > stand with max assist of one caregiver to progress towards LB dressing at sit > stand level OT Short Term Goal 4 (Week 2): Pt will complete LB dressing with max assist   Skilled Therapeutic Interventions/Progress Updates:    Pt seen for OT ADL bathing/dressing session. Pt received at nurses station and appearing agreeable to tx session.  Back in pt's room, he completed stand pivot transfer onto tub bench using grab bars with VCs and assist for positioning of hips back onto bench. He initially required mod A and VCs for upright posture due to R lean, however, once engaged in task pt able to maintain balance with supervision. Verbal and tactile cuing required for proper use of washcloth though despite cuing, pt unable to attend to washing LEs. He stood with use of grab bars for therapist to complete buttock hygiene. +2 required for safe exit from shower as pt with decreased alertness and not following cues for technique to exit and due to R lean. He dressed seated in w/c. With increased time, pt able to manage shirt and dress UB with set-up. He stood at sink to have pants pulled up. On first trial required total A for upright stance due to R lean, though second trial completed with steadying assist. Encouraged pt to use mirror for visual feedback of midline orientation, though due to flexed posture was not successful.   Therapy Documentation Precautions:  Precautions Precautions:  Fall Precaution Comments: R side inattention, strong push to R side, posterior lean Restrictions Weight Bearing Restrictions: No Pain:   Does not appear to be in disstress  See Function Navigator for Current Functional Status.   Therapy/Group: Individual Therapy  [name], Terran Hollenkamp C 10/20/2016, 7:02 AM

## 2016-10-20 NOTE — Progress Notes (Signed)
Occupational Therapy Weekly Progress Note  Patient Details  Name: Douglas [name] MRN: [id number] Date of Birth: Jul 11, 1941  Beginning of progress report period: October 13, 2016 End of progress report period: October 20, 2016   Patient has met 3 of 4 short term goals.  Pt has made excellent progress this week with therapy, despite fluctuations in activity tolerance with increased therapy time.  Pt continues to miss therapy time due to fatigue and decreased activity tolerance.  Pt has progressed to transferring with +1 assist majority of the time, however continues to fluctuate.  Pt benefits from functional, familiar tasks.  Patient continues to demonstrate the following deficits: Rt hemiparesis, Rt inattention, pushing tendencies, impaired initiation, sequencing, problem solving, and activity tolerance, decreased sitting and standing balance and therefore will continue to benefit from skilled OT intervention to enhance overall performance with BADL and Reduce care partner burden.  Patient progressing toward long term goals..  Continue plan of care.  OT Short Term Goals Week 2:  OT Short Term Goal 1 (Week 2): Pt will maintain unsupported sitting balance with Douglas assist for 2 mins to engage in self-care task OT Short Term Goal 1 - Progress (Week 2): Met OT Short Term Goal 2 (Week 2): Pt will transfer to toilet with max assist of 1 caregiver OT Short Term Goal 2 - Progress (Week 2): Progressing toward goal OT Short Term Goal 3 (Week 2): Pt will complete sit > stand with max assist of one caregiver to progress towards LB dressing at sit > stand level OT Short Term Goal 3 - Progress (Week 2): Met OT Short Term Goal 4 (Week 2): Pt will complete LB dressing with max assist  OT Short Term Goal 4 - Progress (Week 2): Met Week 3:  OT Short Term Goal 1 (Week 3): Pt will complete bathing with Douglas assist and Douglas cues for sequencing/ideational apraxia OT Short Term Goal 2 (Week 3): Pt will transfer to  toilet with max assist of 1 caregiver 3 of 4 attempts OT Short Term Goal 3 (Week 3): Pt will complete LB dressing with mod assist 3 of 4 attempts OT Short Term Goal 4 (Week 3): Pt will utilize RUE as diminished level to complete 2 grooming tasks with Douglas cues     Nyella Eckels, [facility] 10/20/2016, 4:03 PM

## 2016-10-20 NOTE — Progress Notes (Signed)
Pt sitting in wheelchair. Started sneezing and belched, started coughing and could not stop, attempted suction of mouth however just a small amount of clear secretions noted. PT with running nose, nasal congestion, sniffles. Continued coughing for approx 2-3 min without relieef. O2 sat 96% RA. Attempted back pats, sitting up straighter in chair, etc. Without relief of coughing and running nose. Pam Love notified re request for cough med or nasal congestion medication. Noted would monitor for now and recommended zofran IV. Zofran given, SLP to check pt. No recommendations from RiponNicole for treatment. Suggested try small sip of liquid tween cough to wet throat, tickle. Able to take in few small sips after zofran administered. Coughing subsided after several minutes of coughing and nasal running clear secretions. Pt shakes head Brett FairySharp, Antonetta Clanton Bwhen asked what happened? Nods that he feels little better now Pamelia HoitSharp, Stephfon Bovey B

## 2016-10-20 NOTE — Progress Notes (Signed)
Physical Therapy Session Note  Patient Details  Name: Douglas [name] MRN: [id number] Date of Birth: 12/25/1940  Today's Date: 10/20/2016 PT Individual Time: 0800-0900 PT Individual Time Calculation (min): 60 min    Skilled Therapeutic Interventions/Progress Updates:   Session focused on postural control and overall balance retraining with focus on re-orientation to midline in seated and standing positions. Pt sat EOB to eat some of his breakfast with cues for swallowing precautions. Placed built up grip on fork to increase ability to use RUE for self-feeding. Supervision to steadying assist for sitting balance with posterior lean during dynamic reaching. Mod assist stand pivot transfers throughout session with cues for hand placement and weightshifting, and extra time for initiation with decreased motor planning noted throughout. W/c mobility training with max assist for hemi-technique in hallway. In gym engaged in transfer training and standing balance for neuro re-ed but pt with incontinent BM requiring return to room for hygiene and new brief. Completed hygiene in standing at sink (min to mod assist for standing balance) with total A for hygiene. Gait training in hallway x 110' with min to mod assist with RW with cues for increasing clearance and step length with RLE (+2 for w/c follow for safety). End of session set-up in w/c at nurses station for safety.   Therapy Documentation Precautions:  Precautions Precautions: Fall Precaution Comments: R side inattention, strong push to R side, posterior lean Restrictions Weight Bearing Restrictions: No   Pain:  Shakes head "no" when asked about pain throughout session.    See Function Navigator for Current Functional Status.   Therapy/Group: Individual Therapy  Karolee StampsGray, Douglas [name]  Douglas [name], PT, DPT  10/20/2016, 9:21 AM

## 2016-10-20 NOTE — Progress Notes (Signed)
Subjective/Complaints: Appreciate hematology consult and follow-up Sitting at edge of bed starting physical therapy. Leans toward the right, as well as posteriorly                                 Unable to obtain ROS due to aphasia   Objective: Vital Signs: Blood pressure (!) 146/51, pulse 91, temperature 97.7 F (36.5 C), temperature source Oral, resp. rate 18, weight 74.4 kg (164 lb), SpO2 91 %. No results found. Results for orders placed or performed during the hospital encounter of 10/05/16 (from the past 72 hour(s))  Glucose, capillary     Status: Abnormal   Collection Time: 10/17/16 11:33 AM  Result Value Ref Range   Glucose-Capillary 255 (H) 65 - 99 mg/dL  CBC     Status: Abnormal   Collection Time: 10/17/16  3:34 PM  Result Value Ref Range   WBC 14.5 (H) 4.0 - 10.5 K/uL   RBC 3.34 (L) 4.22 - 5.81 MIL/uL   Hemoglobin 9.0 (L) 13.0 - 17.0 g/dL   HCT 40.929.1 (L) 81.139.0 - 91.452.0 %   MCV 87.1 78.0 - 100.0 fL   MCH 26.9 26.0 - 34.0 pg   MCHC 30.9 30.0 - 36.0 g/dL   RDW 78.223.2 (H) 95.611.5 - 21.315.5 %   Platelets 163 150 - 400 K/uL  Glucose, capillary     Status: Abnormal   Collection Time: 10/17/16  4:49 PM  Result Value Ref Range   Glucose-Capillary 202 (H) 65 - 99 mg/dL  Glucose, capillary     Status: Abnormal   Collection Time: 10/17/16  8:57 PM  Result Value Ref Range   Glucose-Capillary 116 (H) 65 - 99 mg/dL   Comment 1 Notify RN   Glucose, capillary     Status: Abnormal   Collection Time: 10/18/16  6:19 AM  Result Value Ref Range   Glucose-Capillary 118 (H) 65 - 99 mg/dL  CBC     Status: Abnormal   Collection Time: 10/18/16  7:11 AM  Result Value Ref Range   WBC 11.5 (H) 4.0 - 10.5 K/uL   RBC 2.91 (L) 4.22 - 5.81 MIL/uL   Hemoglobin 7.5 (L) 13.0 - 17.0 g/dL    Comment: REPEATED TO VERIFY   HCT 25.1 (L) 39.0 - 52.0 %   MCV 86.3 78.0 - 100.0 fL   MCH 25.8 (L) 26.0 - 34.0 pg   MCHC 29.9 (L) 30.0 - 36.0 g/dL   RDW 08.623.0 (H) 57.811.5 - 46.915.5 %   Platelets 167 150 - 400 K/uL   Glucose, capillary     Status: Abnormal   Collection Time: 10/18/16 11:51 AM  Result Value Ref Range   Glucose-Capillary 192 (H) 65 - 99 mg/dL  Glucose, capillary     Status: Abnormal   Collection Time: 10/18/16  4:59 PM  Result Value Ref Range   Glucose-Capillary 130 (H) 65 - 99 mg/dL  Glucose, capillary     Status: Abnormal   Collection Time: 10/18/16  8:58 PM  Result Value Ref Range   Glucose-Capillary 181 (H) 65 - 99 mg/dL  CBC     Status: Abnormal   Collection Time: 10/19/16  5:47 AM  Result Value Ref Range   WBC 9.5 4.0 - 10.5 K/uL   RBC 2.79 (L) 4.22 - 5.81 MIL/uL   Hemoglobin 7.6 (L) 13.0 - 17.0 g/dL   HCT 62.924.7 (L) 52.839.0 - 41.352.0 %   MCV 88.5  78.0 - 100.0 fL   MCH 27.2 26.0 - 34.0 pg   MCHC 30.8 30.0 - 36.0 g/dL   RDW 78.2 (H) 95.6 - 21.3 %   Platelets 128 (L) 150 - 400 K/uL  Glucose, capillary     Status: Abnormal   Collection Time: 10/19/16  6:43 AM  Result Value Ref Range   Glucose-Capillary 113 (H) 65 - 99 mg/dL  Type and screen [facility]     Status: None   Collection Time: 10/19/16 11:56 AM  Result Value Ref Range   ISSUE DATE / TIME [id number]    Blood Product Unit Number [id number]    PRODUCT CODE [id number]    Unit Type and Rh 5100    Blood Product Expiration Date 034742595638    ISSUE DATE / TIME [id number]    Blood Product Unit Number [id number]    PRODUCT CODE [id number]    Unit Type and Rh 5100    Blood Product Expiration Date 932355732202   Prepare RBC     Status: None   Collection Time: 10/19/16 11:56 AM  Result Value Ref Range   Order Confirmation ORDER PROCESSED BY BLOOD BANK   Glucose, capillary     Status: Abnormal   Collection Time: 10/19/16 12:11 PM  Result Value Ref Range   Glucose-Capillary 215 (H) 65 - 99 mg/dL  Glucose, capillary     Status: Abnormal   Collection Time: 10/19/16  2:47 PM  Result Value Ref Range   Glucose-Capillary 213 (H) 65 - 99 mg/dL  Glucose, capillary     Status: Abnormal   Collection  Time: 10/19/16  4:57 PM  Result Value Ref Range   Glucose-Capillary 184 (H) 65 - 99 mg/dL  Glucose, capillary     Status: Abnormal   Collection Time: 10/19/16  9:56 PM  Result Value Ref Range   Glucose-Capillary 132 (H) 65 - 99 mg/dL   Comment 1 Notify RN   CBC     Status: Abnormal   Collection Time: 10/20/16  4:39 AM  Result Value Ref Range   WBC 8.6 4.0 - 10.5 K/uL   RBC 4.10 (L) 4.22 - 5.81 MIL/uL   Hemoglobin 10.9 (L) 13.0 - 17.0 g/dL    Comment: DELTA CHECK NOTED REPEATED TO VERIFY    HCT 34.9 (L) 39.0 - 52.0 %   MCV 85.1 78.0 - 100.0 fL   MCH 26.6 26.0 - 34.0 pg   MCHC 31.2 30.0 - 36.0 g/dL   RDW 54.2 (H) 70.6 - 23.7 %   Platelets 107 (L) 150 - 400 K/uL    Comment: CONSISTENT WITH PREVIOUS RESULT  Glucose, capillary     Status: None   Collection Time: 10/20/16  6:32 AM  Result Value Ref Range   Glucose-Capillary 99 65 - 99 mg/dL   Comment 1 Notify RN       General: NAD. Vital signs reviewed.  Psych: Unable to assess due to aphasia Heart: RRR. Lungs: CTA bilaterally. Abdomen: Positive bowel sounds, soft  Skin: Erythema on dorsum of left foot.    Neurologic: Alert Expressive language deficits, garbled speech Inconsistently following commands, moving b/l UE and LLE.  Follows some simple commands Unable to assess MMT and sensation due to language Musculoskeletal: No edema. No tenderness.  Assessment/Plan: 1. 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 Physiatrist is providing close team supervision and 24  hour management of active medical problems listed below. Physiatrist and rehab team continue to assess barriers to discharge/monitor patient progress toward functional and medical goals. FIM: Function - Bathing Position: Shower Body parts bathed by patient: Right arm, Left arm, Chest, Abdomen, Right upper leg, Left  upper leg Body parts bathed by helper: Front perineal area, Buttocks, Back, Right lower leg, Left lower leg Bathing not applicable: Right lower leg, Left lower leg Assist Level:  (Mod assist)  Function- Upper Body Dressing/Undressing What is the patient wearing?: Hospital gown Pull over shirt/dress - Perfomed by patient: Pull shirt over trunk, Thread/unthread left sleeve, Put head through opening Pull over shirt/dress - Perfomed by helper: Thread/unthread right sleeve Assist Level: Set up Function - Lower Body Dressing/Undressing What is the patient wearing?: Pants, Non-skid slipper socks, Ted Hose Position: Wheelchair/chair at [employer] - Performed by helper: Thread/unthread right underwear leg, Thread/unthread left underwear leg, Pull underwear up/down Pants- Performed by patient: Thread/unthread left pants leg Pants- Performed by helper: Thread/unthread right pants leg, Pull pants up/down Non-skid slipper socks- Performed by patient: Don/doff left sock Non-skid slipper socks- Performed by helper: Don/doff right sock, Don/doff left sock TED Hose - Performed by helper: Don/doff right TED hose, Don/doff left TED hose Assist for footwear: Maximal assist Assist for lower body dressing: 2 Helpers  Function - Toileting Toileting activity did not occur: No continent bowel/bladder event Toileting steps completed by helper: Adjust clothing prior to toileting, Performs perineal hygiene, Adjust clothing after toileting Assist level: Two helpers  Function - Archivist transfer activity did not occur: N/A Toilet transfer assistive device: Bedside commode Assist level to toilet: 2 helpers Assist level from toilet: 2 helpers Assist level to bedside commode (at bedside): 2 helpers Assist level from bedside commode (at bedside): 2 helpers  Function - Chair/bed transfer Chair/bed transfer activity did not occur: Safety/medical concerns Chair/bed transfer method: Lateral  scoot Chair/bed transfer assist level: 2 helpers Chair/bed transfer assistive device: Sliding board Mechanical lift: Stedy Chair/bed transfer details: Manual facilitation for weight shifting, Tactile cues for posture, Verbal cues for sequencing, Verbal cues for technique, Manual facilitation for placement  Function - Locomotion: Wheelchair Will patient use wheelchair at discharge?: Yes Type: Manual Wheelchair activity did not occur: Safety/medical concerns Max wheelchair distance: 50 Assist Level: Total assistance (Pt < 25%) Wheel 50 feet with 2 turns activity did not occur: Safety/medical concerns Assist Level: Total assistance (Pt < 25%) Wheel 150 feet activity did not occur: Safety/medical concerns Turns around,maneuvers to table,bed, and toilet,negotiates 3% grade,maneuvers on rugs and over doorsills: No Function - Locomotion: Ambulation Ambulation activity did not occur: Safety/medical concerns Assistive device: Walker-rolling Max distance: 44ft  Assist level: Moderate assist (Pt 50 - 74%) Walk 10 feet activity did not occur: Safety/medical concerns Assist level: Moderate assist (Pt 50 - 74%) Walk 50 feet with 2 turns activity did not occur: Safety/medical concerns Assist level: Moderate assist (Pt 50 - 74%) Walk 150 feet activity did not occur: Safety/medical concerns Walk 10 feet on uneven surfaces activity did not occur: Safety/medical concerns  Function - Comprehension Comprehension: Auditory Comprehension assist level: Understands basic 25 - 49% of the time/ requires cueing 50 - 75% of the time  Function - Expression Expression: Nonverbal Expression assist level: Expresses basis less than 25% of the time/requires cueing >75% of the time.  Function - Social Interaction Social Interaction assist level: Interacts appropriately 25 - 49% of time - Needs frequent redirection.  Function - Problem Solving Problem solving assist level: Solves  basic 25 - 49% of the time -  needs direction more than half the time to initiate, plan or complete simple activities  Function - Memory Memory assist level: Recognizes or recalls less than 25% of the time/requires cueing greater than 75% of the time Patient normally able to recall (first 3 days only): That he or she is in a hospital   Medical Problem List and Plan: 1.  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  DVT Prophylaxis/Anticoagulation: Subcutaneous Lovenox, holding, plt ct recovering, stop, baby aspirin, given ongoing GI bleeding. Appreciate hematology and GI notes  Will need at least weekly CBC to monitor hemoglobin  SCDs ordered 3. Pain Management: Tylenol as needed 4. Dysphagia. Dysphagia #1 nectar liquids. Monitor for any signs of aspiration.   Added daily at bedtime IV fluids 5. Neuropsych: This patient is capable of making decisions on his own behalf. 6. Skin/Wound Care: Routine skin checks 7. Fluids/Electrolytes/Nutrition: Routine I&Os 8. Aspiration pneumonia. Augmentin completed. 9. Diabetes mellitus with peripheral neuropathy. Hemoglobin A1c 7.2.   Check blood sugars before meals and at bedtime  Currently Lantus insulin 16 units daily . A.m. CBGs are in desired range  Novolog 4U with meals added 12/25, increased to 8U on 12/26, increased to 12U on 12/27  CBG's higher in mid day to PM.--increased lantus to 20u q 0800 on 12/30 Consider BID dosing as well CBG (last 3)   Recent Labs  10/19/16 1657 10/19/16 2156 10/20/16 0632  GLUCAP 184* 132* 99    10. Hypertension/tachycardia. Lopressor 12.5 mg twice a day. Monitor heart rate with increased mobility  Overall controlled 12/29 11. Stage III chronic kidney disease. Baseline creatinine 1.62. Follow-up chemistries, Currently  at baseline  BMP Latest Ref Rng & Units 10/16/2016 10/14/2016 10/12/2016  Glucose 65 - 99 mg/dL 454(U) 981(X) -  BUN 6 - 20 mg/dL 18 16 -  Creatinine 9.14 - 1.24 mg/dL 7.82(N) 5.62(Z) 3.08(M)  Sodium 135 - 145 mmol/L 141 139 -  Potassium 3.5 - 5.1 mmol/L 3.2(L) 3.4(L) -  Chloride 101 - 111 mmol/L 108 106 -  CO2 22 - 32 mmol/L 24 23 -  Calcium 8.9 - 10.3 mg/dL 5.7(Q) 4.6(N) -   12. Mood. Depakote 250 mg BID added for bouts of agitation and restlessness. 13. Hypothyroidism. Synthroid 14. Hyperlipidemia. Lipitor 15. Anemia, acute blood loss. Also with pancytopenia related to infection, now improving. Appreciate Hemoccult     Hemoccult + 12/25  Cont to monitor serially  GI consulted, spoke to GI with plans for EGD, family refused, spoke with GI again, cont PPI  Heme/Onc consulted - ?DIC vs microscopic bleed, Heme/Onc on consult,  , improvement in hemoglobin status post transfusion 16.  Leukocytosis:    afebrile x 9d  CXR reviewed 12/22 with stable infiltrates, cont to monitor  WBCs stable 15.1   Foley replaced   1/1 CXR reviewed: basilar opacities unchanged RML,D/CVanc/Zosyn  17. Thrombocytopenia: Negative. Hit panel Improve, off Lovenox  SRA negative  Will cont to monitor 18. Hypoalbuminemia  Supplement started 12/23 19. Acute lower UTI-resolved  Foley to stay in place, difficult placement with tight foreskin.   Urology consult as outpt    20. CKD  At baseline  Cont to monitor  HypoK will supplement  Will cont to monitor   -HS IVF,  may need midline , although fluid intake, improving, 880 mL on 10/19/2016  LOS (Days) 15 A FACE TO FACE EVALUATION WAS PERFORMED  Yamil Oelke E 10/20/2016, 8:44 AM

## 2016-10-20 NOTE — Plan of Care (Signed)
Problem: RH Balance Goal: LTG Patient will maintain dynamic standing balance (PT) LTG:  Patient will maintain dynamic standing balance with assistance during mobility activities (PT)  Upgraded due to pt progress.   Problem: RH Bed Mobility Goal: LTG Patient will perform bed mobility with assist (PT) LTG: Patient will perform bed mobility with assistance, with/without cues (PT).  Upgraded due to pt progress  Problem: RH Ambulation Goal: LTG Patient will ambulate in controlled environment (PT) LTG: Patient will ambulate in a controlled environment, # of feet with assistance (PT).  Upgraded due to pt progress  Problem: RH Stairs Goal: LTG Patient will ambulate up and down stairs w/assist (PT) LTG: Patient will ambulate up and down # of stairs with assistance (PT)  Upgraded due to pt progress

## 2016-10-20 NOTE — Progress Notes (Signed)
Physical Therapy Weekly Progress Note  Patient Details  Name: Douglas [name] MRN: [id number] Date of Birth: 10-14-1941  Beginning of progress report period: October 14, 2016 End of progress report period: October 21, 2015   Patient has met 2 of 3 short term goals.  Pt has made excellent progress this week with therapy and is currently transferring with consistent +1 assist for stand and squat/pivot transfers, as well as ambulating with RW and min>max assist.    Patient continues to demonstrate the following deficits muscle weakness, decreased cardiorespiratoy endurance, impaired timing and sequencing, unbalanced muscle activation, motor apraxia, decreased coordination and decreased motor planning, decreased midline orientation, ideational apraxia and decreased attention, decreased initiation, decreased attention, decreased awareness, decreased problem solving, decreased safety awareness, decreased memory and delayed processing and decreased sitting balance, decreased standing balance, decreased postural control and decreased balance strategies and therefore will continue to benefit from skilled PT intervention to increase functional independence with mobility.  Patient progressing toward long term goals..  Plan of care revisions: goals updated to range from supervision for sitting and static standing tasks, min assist for transfers and gait with increased distance to reflect pt's progression with skilled PT in the last reporting period. .  PT Short Term Goals Week 2:  PT Short Term Goal 1 (Week 2): Pt will self propel w/c x25' with mod assist PT Short Term Goal 1 - Progress (Week 2): Not met PT Short Term Goal 2 (Week 2): Pt will transfer with mod assist +1 PT Short Term Goal 2 - Progress (Week 2): Met PT Short Term Goal 3 (Week 2): Pt will initiate gait training with PT.  PT Short Term Goal 3 - Progress (Week 2): Met Week 3:  PT Short Term Goal 1 (Week 3): Pt will ambulate x100' with LRAD  and mod assist.  PT Short Term Goal 2 (Week 3): Pt will self propel w/c 25' with min assist.  PT Short Term Goal 3 (Week 3): Pt will maintain static standing balance for 3 minutes during a functional task with min assist   Skilled Therapeutic Interventions/Progress Updates:  Ambulation/gait training;Discharge planning;Functional mobility training;Psychosocial support;Therapeutic Activities;Visual/perceptual remediation/compensation;Wheelchair propulsion/positioning;Therapeutic Exercise;Neuromuscular re-education;Balance/vestibular training;Skin care/wound management;Cognitive remediation/compensation;DME/adaptive equipment instruction;UE/LE Strength taining/ROM;Splinting/orthotics;Functional electrical stimulation;Patient/family education;Stair training;UE/LE Coordination activities   Therapy Documentation Precautions:  Precautions Precautions: Fall Precaution Comments: R side inattention, strong push to R side, posterior lean Restrictions Weight Bearing Restrictions: No   See Function Navigator for Current Functional Status.  Therapy/Group: Individual Therapy  Earnest Conroy Penven-Crew 10/20/2016, 8:47 AM

## 2016-10-20 NOTE — Progress Notes (Signed)
Speech Language Pathology Weekly Progress and Session Note  Patient Details  Name: Douglas [name] MRN: [id number] Date of Birth: [birth year]  Beginning of progress report period: October 13, 2016  End of progress report period: October 20, 2016  Today's Date: 10/20/2016 SLP Individual Time: 1100-1150 SLP Individual Time Calculation (min): 50 min   Short Term Goals: Week 2: SLP Short Term Goal 1 (Week 2): Pt will answer yes/no questions for 75% accuracy with max assist verbal cues.   SLP Short Term Goal 1 - Progress (Week 2): Met SLP Short Term Goal 2 (Week 2): Pt will follow 1 step commands for 75% accuracy with max assist verbal cues.   SLP Short Term Goal 2 - Progress (Week 2): Met SLP Short Term Goal 3 (Week 2): Pt will consume dys 1 textures and honey thick liquids via teaspoon with mod assist for use of swallowing precautions and minimal overt s/s of aspiration.   SLP Short Term Goal 3 - Progress (Week 2): Met SLP Short Term Goal 4 (Week 2): Pt will verbalize at the word level during structured tasks for 50% accuracy with max assist multimodal cues. SLP Short Term Goal 4 - Progress (Week 2): Progressing toward goal SLP Short Term Goal 5 (Week 2): Pt will utilize multimodal means of communication to convey needs and wants to caregivers with mod assist cues.   SLP Short Term Goal 5 - Progress (Week 2): Progressing toward goal    New Short Term Goals: Week 3: SLP Short Term Goal 1 (Week 3): Pt will answer yes/no questions for 75% accuracy with mod assist verbal cues.   SLP Short Term Goal 2 (Week 3): Pt will follow 1 step commands for 75% accuracy with mod assist verbal cues.   SLP Short Term Goal 3 (Week 3): Pt will consume dys 1 textures and honey thick liquids via teaspoon with min assist for use of swallowing precautions and minimal overt s/s of aspiration.   SLP Short Term Goal 4 (Week 3): Pt will verbalize at the word level during structured tasks for 50% accuracy with max assist  multimodal cues. SLP Short Term Goal 5 (Week 3): Pt will utilize multimodal means of communication to convey needs and wants to caregivers with mod assist cues.    Weekly Progress Updates: Pt made functional gains this reporting period and has met 3 out of 5 short term goals.  Pt is consuming dys 1 textures and honey thick liquids with mod assist verbal and tactile cues for use of swallowing precautions and minimal overt s/s of aspiration.  Pt is also demonstrating more consistent use of yes/no responses either via head nod/shake or verbalization to convey needs and wants to caregivers when asked as well as more consistent ability to follow 1 step commands in a structured task.  Pt continues to exhibit limited verbal output and decreased initiation of functional communication during structured or unstructured tasks.  Pt would continue to benefit from skilled ST while inpatient in order to maximize functional independence and reduce burden of care prior to discharge.  No family has been present for education this reporting period but SLP will continue efforts as they are available to attend sessions.     Intensity: Minumum of 1-2 x/day, 30 to 90 minutes Frequency: 3 to 5 out of 7 days Duration/Length of Stay: 28 days  Treatment/Interventions: Cognitive remediation/compensation;Cueing hierarchy;Dysphagia/aspiration precaution training;Environmental controls;Functional tasks;Internal/external aids;Patient/family education   Daily Session  Skilled Therapeutic Interventions: Pt was seen for skilled ST targeting  goals for dysphagia and communication.  SLP facilitated the session with a basic pipe tree task targeting auditory comprehension of 1 step commands in a structured context.  Pt was able to follow 1 step commands fairly consistently with mod assist verbal and visual cues for sequencing and initiation during task.  Pt sustained his attention to task for 2 minute intervals with min-mod verbal cues for  redirection to task.  Pt also consumed honey thick liquids via teaspoon with mod assist verbal and tactile cues without overt s/s of aspiration.  Pt repeatedly declined presentations of purees.  Per RN, pt has been coughing a lot today outside of PO intake and was noted with increased nasal drainage during today's therapy session.  Pt appeared lethargic throughout session but remained pleasant and participatory and repeatedly verbalized "no" when asked if he wanted to return to bed.  Pt needed increased stimulation to maintain alertness towards the end of therapy; as a result, session was ended early.  Pt was left in wheelchair at the nursing station with quick release belt donned.  Goals updated on this date to reflect current progress and plan of care.       Function:   Eating Eating   Modified Consistency Diet: Yes Eating Assist Level: Helper scoops food on utensil;Supervision or verbal cues;Set up assist for   Eating Set Up Assist For: Opening containers Helper Scoops Food on Utensil: Occasionally Helper Brings Food to Mouth: Occasionally   Cognition Comprehension Comprehension assist level: Understands basic 25 - 49% of the time/ requires cueing 50 - 75% of the time  Expression   Expression assist level: Expresses basic 25 - 49% of the time/requires cueing 50 - 75% of the time. Uses single words/gestures.  Social Interaction Social Interaction assist level: Interacts appropriately 50 - 74% of the time - May be physically or verbally inappropriate.  Problem Solving Problem solving assist level: Solves basic 50 - 74% of the time/requires cueing 25 - 49% of the time  Memory Memory assist level: Recognizes or recalls less than 25% of the time/requires cueing greater than 75% of the time   General    Pain Pain Assessment Pain Assessment: No/denies pain  Therapy/Group: Individual Therapy  Shalae Belmonte, Selinda Orion 10/20/2016, 1:06 PM

## 2016-10-21 ENCOUNTER — Inpatient Hospital Stay (HOSPITAL_COMMUNITY): Payer: Medicare Other

## 2016-10-21 ENCOUNTER — Inpatient Hospital Stay (HOSPITAL_COMMUNITY): Payer: Medicare Other | Admitting: Occupational Therapy

## 2016-10-21 ENCOUNTER — Inpatient Hospital Stay (HOSPITAL_COMMUNITY): Payer: Medicare Other | Admitting: *Deleted

## 2016-10-21 LAB — BASIC METABOLIC PANEL
Anion gap: 14 (ref 5–15)
BUN: 23 mg/dL — AB (ref 6–20)
CALCIUM: 8.2 mg/dL — AB (ref 8.9–10.3)
CO2: 23 mmol/L (ref 22–32)
CREATININE: 1.34 mg/dL — AB (ref 0.61–1.24)
Chloride: 113 mmol/L — ABNORMAL HIGH (ref 101–111)
GFR calc Af Amer: 58 mL/min — ABNORMAL LOW (ref >60)
GFR, EST NON AFRICAN AMERICAN: 50 mL/min — AB (ref >60)
GLUCOSE: 94 mg/dL (ref 65–99)
Potassium: 3.5 mmol/L (ref 3.5–5.1)
Sodium: 150 mmol/L — ABNORMAL HIGH (ref 135–145)

## 2016-10-21 LAB — GLUCOSE, CAPILLARY
Glucose-Capillary: 88 mg/dL (ref 65–99)
Glucose-Capillary: 177 mg/dL — ABNORMAL HIGH (ref 65–99)
Glucose-Capillary: 59 mg/dL — ABNORMAL LOW (ref 65–99)
Glucose-Capillary: 83 mg/dL (ref 65–99)
Glucose-Capillary: 335 mg/dL — ABNORMAL HIGH (ref 65–99)

## 2016-10-21 MED ORDER — SODIUM CHLORIDE 0.45 % IV SOLN
INTRAVENOUS | Status: DC
  Administered 2016-10-21 – 2016-10-23 (×4): via INTRAVENOUS
  Administered 2016-10-24: 75 mL/h via INTRAVENOUS
  Administered 2016-10-25: 18:00:00 via INTRAVENOUS

## 2016-10-21 NOTE — Significant Event (Signed)
Hypoglycemic Event   CBG: 59   Treatment: 15 GM carbohydrate snack  Symptoms: None  Follow-up CBG: Time:1730 CBG Result:86  Possible Reasons for Event: Unknown  Comments/MD notified: Per MD order, hold scheduled novolog 12units    Lorn JunesLaurie T Mizuki Hoel

## 2016-10-21 NOTE — Progress Notes (Signed)
Physical Therapy Session Note  Patient Details  Name: Douglas [name] MRN: [id number] Date of Birth: 02/05/1941  Today's Date: 10/21/2016 PT Individual Time: 1300-1400 PT Individual Time Calculation (min): 60 min     Skilled Therapeutic Interventions/Progress Updates: Patient in bed, agrees to therapy session , transfer to sit EOB with mod A, transfer with sliding board to [name]/c with max A,and increased need for cues.  [name]/c propulsion to therapy gym with mod A and cues for path following and to use L Le for propulsion and steering.  Transfer to mat via stand pivot with mod to max A. EOM sitting balance training with one step commands to place cup on designed number.  Sit to stand from mat with and [name]/o UE push, mod to max A. Static standing 3 x 2 min with initially min a towards the end needs max A as R side push is setting in and loss of balance noted.   1/2 sit to stand and back to standing x6 with mod to max Ain ordder to [employer] motor control, strengthen B quads and weight acceptance on L. Patient propelled [name]/c back to room with mod A. Left sitting at nurses desk with QR belt on .    Therapy Documentation Precautions:  Precautions Precautions: Fall Precaution Comments: R side inattention, strong push to R side, posterior lean Restrictions Weight Bearing Restrictions: No   See Function Navigator for Current Functional Status.   Therapy/Group: Individual Therapy  Dorna MaiCzajkowska, Douglas [name] 10/21/2016, 2:57 PM

## 2016-10-21 NOTE — Progress Notes (Signed)
Occupational Therapy Session Note  Patient Details  Name: Douglas [name] MRN: [id number] Date of Birth: 12/25/1940  Today's Date: 10/21/2016 OT Individual Time: 0945-1100 OT Individual Time Calculation (min): 75 min     Short Term Goals: Week 3:  OT Short Term Goal 1 (Week 3): Pt will complete bathing with min assist and min cues for sequencing/ideational apraxia OT Short Term Goal 2 (Week 3): Pt will transfer to toilet with max assist of 1 caregiver 3 of 4 attempts OT Short Term Goal 3 (Week 3): Pt will complete LB dressing with mod assist 3 of 4 attempts OT Short Term Goal 4 (Week 3): Pt will utilize RUE as diminished level to complete 2 grooming tasks with min cues  Skilled Therapeutic Interventions/Progress Updates:   Patient participation as follows: Bathing and dressing and brief change with Mod physical Assist, extra time for planning/processing, and Mod verbal or tactile cues and also due to decreased core strength and stability as well as right hemiparesis  Patient with great expressive aphasia.  He initiated using R UE for bilateral tasks of washing face ad hands and drying hands He required max assist for bed mobility and mod assist for positioning.  Patient with SOB with all tasks of bathing, dressing and bed positioning  Patient elected to stay in bed at end of session as he appeared fatigued with SOB and lying very still to rest and shaking his head "oNo" when asked if he had the energy to sit in chair for a bit.  He was left with HOB elevated and call bell in reach and alarm set  Therapy Documentation Precautions:  Precautions Precautions: Fall Precaution Comments: R side inattention, strong push to R side, posterior lean Restrictions Weight Bearing Restrictions: No  Pain:denied    See Function Navigator for Current Functional Status.   Therapy/Group: Individual Therapy  [name], [name] 10/21/2016, 2:53 PM

## 2016-10-21 NOTE — Progress Notes (Signed)
Occupational Therapy Session Note  Patient Details  Name: Douglas [name] MRN: [id number] Date of Birth: 05/11/1941  Today's Date: 10/21/2016 OT Individual Time: 1445-1530 OT Individual Time Calculation (min): 45 min   Short Term Goals: Week 3:  OT Short Term Goal 1 (Week 3): Pt will complete bathing with min assist and min cues for sequencing/ideational apraxia OT Short Term Goal 2 (Week 3): Pt will transfer to toilet with max assist of 1 caregiver 3 of 4 attempts OT Short Term Goal 3 (Week 3): Pt will complete LB dressing with mod assist 3 of 4 attempts OT Short Term Goal 4 (Week 3): Pt will utilize RUE as diminished level to complete 2 grooming tasks with min cueso   Skilled Therapeutic Interventions/Progress Updates:   ADL-retraining with focus on seated grooming, LB undressing skills, transfers.   Pt received at RN station and agreeable to grooming, undressing and return to bed. Pt attempted grooming tasks as directed using right hand but was unable to sustain grip on washcloth, comb or toothbrush issued from supplies at sink.   With setup pt combed his hair 50% brushed his teeth and washed his face with his left hand.  OT provided total assist to shave at pt's request (as evidenced by ffirming questions from therapist).    Pt was then positioned near bedside to practice undressing and sustained good effort throughout, doffing his tee-shirt with min assist, and shoes with moderated assist using RUE as gross assist with tactile and min vc.   Pt performed stand pivot transfer to bed with mod assist to rise and tactile cues to hold his head upright to maintain midline orientation.   Pt required max assist to reposition in bed and was left with call light within reach and bed alarm activated.  Therapy Documentation Precautions:  Precautions Precautions: Fall Precaution Comments: R side inattention, strong push to R side, posterior lean Restrictions Weight Bearing Restrictions: No  Vital  Signs: Therapy Vitals Temp: 98.6 F (37 C) Temp Source: Tympanic Pulse Rate: 98 Resp: 18 BP: (!) 158/78 Patient Position (if appropriate): Lying Oxygen Therapy SpO2: 96 % O2 Device: Not Delivered   Pain: No/denies pain    See Function Navigator for Current Functional Status.   Therapy/Group: Individual Therapy  [name] 10/21/2016, 3:48 PM

## 2016-10-21 NOTE — Progress Notes (Signed)
Subjective/Complaints: Working with OT this morning. Patient is alert, remains severely aphasic.                                 Unable to obtain ROS due to aphasia   Objective: Vital Signs: Blood pressure (!) 148/65, pulse 95, temperature 98 F (36.7 C), temperature source Oral, resp. rate 18, weight 74.4 kg (164 lb), SpO2 95 %. No results found. Results for orders placed or performed during the hospital encounter of 10/05/16 (from the past 72 hour(s))  Glucose, capillary     Status: Abnormal   Collection Time: 10/18/16 11:51 AM  Result Value Ref Range   Glucose-Capillary 192 (H) 65 - 99 mg/dL  Glucose, capillary     Status: Abnormal   Collection Time: 10/18/16  4:59 PM  Result Value Ref Range   Glucose-Capillary 130 (H) 65 - 99 mg/dL  Glucose, capillary     Status: Abnormal   Collection Time: 10/18/16  8:58 PM  Result Value Ref Range   Glucose-Capillary 181 (H) 65 - 99 mg/dL  CBC     Status: Abnormal   Collection Time: 10/19/16  5:47 AM  Result Value Ref Range   WBC 9.5 4.0 - 10.5 K/uL   RBC 2.79 (L) 4.22 - 5.81 MIL/uL   Hemoglobin 7.6 (L) 13.0 - 17.0 g/dL   HCT 24.7 (L) 39.0 - 52.0 %   MCV 88.5 78.0 - 100.0 fL   MCH 27.2 26.0 - 34.0 pg   MCHC 30.8 30.0 - 36.0 g/dL   RDW 23.2 (H) 11.5 - 15.5 %   Platelets 128 (L) 150 - 400 K/uL  Glucose, capillary     Status: Abnormal   Collection Time: 10/19/16  6:43 AM  Result Value Ref Range   Glucose-Capillary 113 (H) 65 - 99 mg/dL  Type and screen [place]     Status: None   Collection Time: 10/19/16 11:56 AM  Result Value Ref Range   ISSUE DATE / TIME [id number]    Blood Product Unit Number [id number]    PRODUCT CODE [id number]    Unit Type and Rh 5100    Blood Product Expiration Date [id number]    ISSUE DATE / TIME [id number]    Blood Product Unit Number [id number]    PRODUCT CODE [id number]    Unit Type and Rh 5100    Blood Product Expiration Date [id number]   Prepare RBC     Status: None    Collection Time: 10/19/16 11:56 AM  Result Value Ref Range   Order Confirmation ORDER PROCESSED BY BLOOD BANK   Glucose, capillary     Status: Abnormal   Collection Time: 10/19/16 12:11 PM  Result Value Ref Range   Glucose-Capillary 215 (H) 65 - 99 mg/dL  Glucose, capillary     Status: Abnormal   Collection Time: 10/19/16  2:47 PM  Result Value Ref Range   Glucose-Capillary 213 (H) 65 - 99 mg/dL  Glucose, capillary     Status: Abnormal   Collection Time: 10/19/16  4:57 PM  Result Value Ref Range   Glucose-Capillary 184 (H) 65 - 99 mg/dL  Glucose, capillary     Status: Abnormal   Collection Time: 10/19/16  9:56 PM  Result Value Ref Range   Glucose-Capillary 132 (H) 65 - 99 mg/dL   Comment 1 Notify RN   CBC     Status: Abnormal   Collection Time: 10/20/16  4:39 AM  Result Value Ref Range   WBC 8.6 4.0 - 10.5 K/uL   RBC 4.10 (L) 4.22 - 5.81 MIL/uL   Hemoglobin 10.9 (L) 13.0 - 17.0 g/dL    Comment: DELTA CHECK NOTED REPEATED TO VERIFY    HCT 34.9 (L) 39.0 - 52.0 %   MCV 85.1 78.0 - 100.0 fL   MCH 26.6 26.0 - 34.0 pg   MCHC 31.2 30.0 - 36.0 g/dL   RDW 20.9 (H) 11.5 - 15.5 %   Platelets 107 (L) 150 - 400 K/uL    Comment: CONSISTENT WITH PREVIOUS RESULT  Glucose, capillary     Status: None   Collection Time: 10/20/16  6:32 AM  Result Value Ref Range   Glucose-Capillary 99 65 - 99 mg/dL   Comment 1 Notify RN   Glucose, capillary     Status: Abnormal   Collection Time: 10/20/16 12:30 PM  Result Value Ref Range   Glucose-Capillary 178 (H) 65 - 99 mg/dL  Glucose, capillary     Status: Abnormal   Collection Time: 10/20/16  4:53 PM  Result Value Ref Range   Glucose-Capillary 114 (H) 65 - 99 mg/dL  Glucose, capillary     Status: Abnormal   Collection Time: 10/20/16  9:06 PM  Result Value Ref Range   Glucose-Capillary 170 (H) 65 - 99 mg/dL  Glucose, capillary     Status: None   Collection Time: 10/21/16  6:33 AM  Result Value Ref Range   Glucose-Capillary 88 65 - 99 mg/dL   Basic metabolic panel     Status: Abnormal   Collection Time: 10/21/16  8:38 AM  Result Value Ref Range   Sodium 150 (H) 135 - 145 mmol/L   Potassium 3.5 3.5 - 5.1 mmol/L   Chloride 113 (H) 101 - 111 mmol/L   CO2 23 22 - 32 mmol/L   Glucose, Bld 94 65 - 99 mg/dL   BUN 23 (H) 6 - 20 mg/dL   Creatinine, Ser 1.34 (H) 0.61 - 1.24 mg/dL   Calcium 8.2 (L) 8.9 - 10.3 mg/dL   GFR calc non Af Amer 50 (L) >60 mL/min   GFR calc Af Amer 58 (L) >60 mL/min    Comment: (NOTE) The eGFR has been calculated using the CKD EPI equation. This calculation has not been validated in all clinical situations. eGFR's persistently <60 mL/min signify possible Chronic Kidney Disease.    Anion gap 14 5 - 15  Glucose, capillary     Status: Abnormal   Collection Time: 10/21/16 11:40 AM  Result Value Ref Range   Glucose-Capillary 177 (H) 65 - 99 mg/dL      General: NAD. Vital signs reviewed.  Psych: Unable to assess due to aphasia Heart: RRR. Lungs: CTA bilaterally. Abdomen: Positive bowel sounds, soft  Skin: Erythema on dorsum of left foot.    Neurologic: Alert Expressive language deficits, garbled speech Inconsistently following commands, moving b/l UE and LLE.  Follows some simple commands Unable to assess MMT and sensation due to language Musculoskeletal: No edema. No tenderness.  Assessment/Plan: 1. 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 Physiatrist is providing close team supervision and 24 hour management of active medical problems listed below. Physiatrist and rehab team continue to assess barriers to discharge/monitor patient progress toward functional and medical goals. FIM: Function - Bathing Position: Shower Body parts bathed by patient:  Right arm, Left arm, Chest, Abdomen Body parts bathed by helper: Front perineal area, Buttocks, Back,  Right lower leg, Left lower leg, Right upper leg, Left upper leg Bathing not applicable: Right lower leg, Left lower leg Assist Level:  (Mod assist)  Function- Upper Body Dressing/Undressing What is the patient wearing?: Pull over shirt/dress Pull over shirt/dress - Perfomed by patient: Pull shirt over trunk, Thread/unthread left sleeve, Put head through opening, Thread/unthread right sleeve Pull over shirt/dress - Perfomed by helper: Thread/unthread right sleeve Assist Level: Supervision or verbal cues, Set up Set up : To obtain clothing/put away Function - Lower Body Dressing/Undressing What is the patient wearing?: Pants, Maryln Manuel, Shoes Position: Wheelchair/chair at [employer] - Performed by helper: Thread/unthread right underwear leg, Thread/unthread left underwear leg, Pull underwear up/down Pants- Performed by patient: Thread/unthread right pants leg, Thread/unthread left pants leg, Pull pants up/down Pants- Performed by helper: Thread/unthread right pants leg, Pull pants up/down Non-skid slipper socks- Performed by patient: Don/doff left sock Non-skid slipper socks- Performed by helper: Don/doff right sock, Don/doff left sock Shoes - Performed by helper: Don/doff right shoe, Don/doff left shoe, Fasten right, Fasten left TED Hose - Performed by helper: Don/doff right TED hose, Don/doff left TED hose Assist for footwear: Maximal assist Assist for lower body dressing: 2 Helpers  Function - Toileting Toileting activity did not occur: No continent bowel/bladder event Toileting steps completed by helper: Adjust clothing prior to toileting, Performs perineal hygiene, Adjust clothing after toileting Toileting Assistive Devices: Other (comment) (holding onto sink) Assist level: Touching or steadying assistance (Pt.75%)  Function - Air cabin crew transfer activity did not occur: N/A Toilet transfer assistive device: Bedside commode Assist level to toilet: 2 helpers Assist  level from toilet: 2 helpers Assist level to bedside commode (at bedside): 2 helpers Assist level from bedside commode (at bedside): 2 helpers  Function - Chair/bed transfer Chair/bed transfer activity did not occur: Safety/medical concerns Chair/bed transfer method: Stand pivot Chair/bed transfer assist level: Moderate assist (Pt 50 - 74%/lift or lower) Chair/bed transfer assistive device: Armrests Mechanical lift: Stedy Chair/bed transfer details: Manual facilitation for weight shifting, Tactile cues for posture, Verbal cues for sequencing, Verbal cues for technique, Manual facilitation for placement  Function - Locomotion: Wheelchair Will patient use wheelchair at discharge?: Yes Type: Manual Wheelchair activity did not occur: Safety/medical concerns Max wheelchair distance: 30' Assist Level: Maximal assistance (Pt 25 - 49%) Wheel 50 feet with 2 turns activity did not occur: Safety/medical concerns Assist Level: Total assistance (Pt < 25%) Wheel 150 feet activity did not occur: Safety/medical concerns Turns around,maneuvers to table,bed, and toilet,negotiates 3% grade,maneuvers on rugs and over doorsills: No Function - Locomotion: Ambulation Ambulation activity did not occur: Safety/medical concerns Assistive device: Walker-rolling Max distance: 110' Assist level: 2 helpers (mod A with +2 for w/c follow) Walk 10 feet activity did not occur: Safety/medical concerns Assist level: 2 helpers Walk 50 feet with 2 turns activity did not occur: Safety/medical concerns Assist level: 2 helpers Walk 150 feet activity did not occur: Safety/medical concerns Walk 10 feet on uneven surfaces activity did not occur: Safety/medical concerns  Function - Comprehension Comprehension: Auditory Comprehension assist level: Understands basic 25 - 49% of the time/ requires cueing 50 - 75% of the time  Function - Expression Expression: Nonverbal Expression assist level: Expresses basic 25 - 49% of  the time/requires cueing 50 - 75% of the time. Uses single words/gestures.  Function - Social Interaction Social Interaction assist level: Interacts appropriately 50 - 74%  of the time - May be physically or verbally inappropriate.  Function - Problem Solving Problem solving assist level: Solves basic 50 - 74% of the time/requires cueing 25 - 49% of the time  Function - Memory Memory assist level: Recognizes or recalls less than 25% of the time/requires cueing greater than 75% of the time Patient normally able to recall (first 3 days only): That he or she is in a hospital   Medical Problem List and Plan: 1.  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  DVT Prophylaxis/Anticoagulation: Subcutaneous Lovenox, holding, plt ct recovering, stop, baby aspirin, given ongoing GI bleeding. Appreciate hematology and GI notes  Will need at least weekly CBC to monitor hemoglobin  SCDs ordered 3. Pain Management: Tylenol as needed 4. Dysphagia. Dysphagia #1 nectar liquids. Monitor for any signs of aspiration.   Added daily at bedtime IV fluids 5. Neuropsych: This patient is capable of making decisions on his own behalf. 6. Skin/Wound Care: Routine skin checks 7. Fluids/Electrolytes/Nutrition: Routine I&Os, IV fluid held, however. BUN/creatinine are creeping up, will resume 8. Aspiration pneumonia. Augmentin completed. 9. Diabetes mellitus with peripheral neuropathy. Hemoglobin A1c 7.2.   Check blood sugars before meals and at bedtime  Currently Lantus insulin 16 units daily . A.m. CBGs are in desired range  Novolog 4U with meals added 12/25, increased to 8U on 12/26, increased to [place] on 12/27  CBG's higher in mid day to PM.--increased lantus to 20u q 0800 on 12/30 Consider BID  dosing as well CBG (last 3)   Recent Labs  10/20/16 2106 10/21/16 0633 10/21/16 1140  GLUCAP 170* 88 177*    10. Hypertension/tachycardia. Lopressor 12.5 mg twice a day. Monitor heart rate with increased mobility  Overall controlled 12/29 11. Stage III chronic kidney disease. Baseline creatinine 1.62. Follow-up chemistries, Currently at baseline  BMP Latest Ref Rng & Units 10/21/2016 10/16/2016 10/14/2016  Glucose 65 - 99 mg/dL 94 132(H) 112(H)  BUN 6 - 20 mg/dL 23(H) 18 16  Creatinine 0.61 - 1.24 mg/dL 1.34(H) 1.32(H) 1.32(H)  Sodium 135 - 145 mmol/L 150(H) 141 139  Potassium 3.5 - 5.1 mmol/L 3.5 3.2(L) 3.4(L)  Chloride 101 - 111 mmol/L 113(H) 108 106  CO2 22 - 32 mmol/L '23 24 23  '$ Calcium 8.9 - 10.3 mg/dL 8.2(L) 8.4(L) 8.4(L)   12. Mood. Depakote 250 mg BID added for bouts of agitation and restlessness. 13. Hypothyroidism. Synthroid 14. Hyperlipidemia. Lipitor 15. Anemia, acute blood loss. Also with pancytopenia related to infection, now improving. Appreciate Hemoccult     Hemoccult + 12/25  Cont to monitor serially  GI consulted, spoke to GI with plans for EGD, family refused, spoke with GI again, cont PPI  Heme/Onc consulted - ?DIC vs microscopic bleed, Heme/Onc on consult,  , improvement in hemoglobin status post transfusion Continue to monitor. Check CBC in a.m.                    16.  Leukocytosis:    afebrile x 9d      Foley replaced   17. Thrombocytopenia: Negative. Hit panel Improve, off Lovenox  SRA negative  Will cont to monitor 18. Hypoalbuminemia  Supplement started 12/23 19. Acute lower UTI-resolved  Foley to stay in place, difficult placement with tight foreskin.   Urology  consult as outpt    20. CKD  At baseline  Cont to monitor  HypoK , improved, continue to supplement  Will cont to monitor   -HS IVF, may need midline , although fluid intake, improving, 880 mL on 10/19/2016  LOS (Days) 16 A FACE TO FACE EVALUATION WAS  PERFORMED  Hend Mccarrell E 10/21/2016, 11:48 AM

## 2016-10-22 ENCOUNTER — Inpatient Hospital Stay (HOSPITAL_COMMUNITY): Payer: Medicare Other

## 2016-10-22 LAB — GLUCOSE, CAPILLARY
GLUCOSE-CAPILLARY: 198 mg/dL — AB (ref 65–99)
Glucose-Capillary: 187 mg/dL — ABNORMAL HIGH (ref 65–99)
Glucose-Capillary: 197 mg/dL — ABNORMAL HIGH (ref 65–99)
Glucose-Capillary: 105 mg/dL — ABNORMAL HIGH (ref 65–99)

## 2016-10-22 MED ORDER — INSULIN GLARGINE 100 UNIT/ML ~~LOC~~ SOLN
25.0000 [IU] | Freq: Every day | SUBCUTANEOUS | Status: DC
  Administered 2016-10-23 – 2016-10-31 (×9): 25 [IU] via SUBCUTANEOUS
  Filled 2016-10-22 (×9): qty 0.25

## 2016-10-22 MED ORDER — METOPROLOL TARTRATE 25 MG PO TABS
25.0000 mg | ORAL_TABLET | Freq: Two times a day (BID) | ORAL | Status: DC
  Administered 2016-10-22 – 2016-10-27 (×10): 25 mg via ORAL
  Filled 2016-10-22 (×10): qty 1

## 2016-10-22 NOTE — Progress Notes (Signed)
Subjective/Complaints: No issues per nursing. Sitting at the nursing desk. Appreciate PT notes from this morning                                Unable to obtain ROS due to aphasia   Objective: Vital Signs: Blood pressure (!) 161/71, pulse 100, temperature 97.6 F (36.4 C), temperature source Oral, resp. rate 18, weight 74.4 kg (164 lb), SpO2 90 %. No results found. Results for orders placed or performed during the hospital encounter of 10/05/16 (from the past 72 hour(s))  Type and screen [place]     Status: None   Collection Time: 10/19/16 11:56 AM  Result Value Ref Range   ISSUE DATE / TIME [id number]    Blood Product Unit Number [id number]    PRODUCT CODE [id number]    Unit Type and Rh 5100    Blood Product Expiration Date [id number]    ISSUE DATE / TIME [id number]    Blood Product Unit Number [id number]    PRODUCT CODE [id number]    Unit Type and Rh 5100    Blood Product Expiration Date [id number]   Prepare RBC     Status: None   Collection Time: 10/19/16 11:56 AM  Result Value Ref Range   Order Confirmation ORDER PROCESSED BY BLOOD BANK   Glucose, capillary     Status: Abnormal   Collection Time: 10/19/16 12:11 PM  Result Value Ref Range   Glucose-Capillary 215 (H) 65 - 99 mg/dL  Glucose, capillary     Status: Abnormal   Collection Time: 10/19/16  2:47 PM  Result Value Ref Range   Glucose-Capillary 213 (H) 65 - 99 mg/dL  Glucose, capillary     Status: Abnormal   Collection Time: 10/19/16  4:57 PM  Result Value Ref Range   Glucose-Capillary 184 (H) 65 - 99 mg/dL  Glucose, capillary     Status: Abnormal   Collection Time: 10/19/16  9:56 PM  Result Value Ref Range   Glucose-Capillary 132 (H) 65 - 99 mg/dL   Comment 1 Notify RN   CBC     Status: Abnormal   Collection Time: 10/20/16  4:39 AM  Result Value Ref Range   WBC 8.6 4.0 - 10.5 K/uL   RBC 4.10 (L) 4.22 - 5.81 MIL/uL   Hemoglobin 10.9 (L) 13.0 - 17.0 g/dL    Comment: DELTA  CHECK NOTED REPEATED TO VERIFY    HCT 34.9 (L) 39.0 - 52.0 %   MCV 85.1 78.0 - 100.0 fL   MCH 26.6 26.0 - 34.0 pg   MCHC 31.2 30.0 - 36.0 g/dL   RDW 20.9 (H) 11.5 - 15.5 %   Platelets 107 (L) 150 - 400 K/uL    Comment: CONSISTENT WITH PREVIOUS RESULT  Glucose, capillary     Status: None   Collection Time: 10/20/16  6:32 AM  Result Value Ref Range   Glucose-Capillary 99 65 - 99 mg/dL   Comment 1 Notify RN   Glucose, capillary     Status: Abnormal   Collection Time: 10/20/16 12:30 PM  Result Value Ref Range   Glucose-Capillary 178 (H) 65 - 99 mg/dL  Glucose, capillary     Status: Abnormal   Collection Time: 10/20/16  4:53 PM  Result Value Ref Range   Glucose-Capillary 114 (H) 65 - 99 mg/dL  Glucose, capillary     Status: Abnormal   Collection Time: 10/20/16  9:06  PM  Result Value Ref Range   Glucose-Capillary 170 (H) 65 - 99 mg/dL  Glucose, capillary     Status: None   Collection Time: 10/21/16  6:33 AM  Result Value Ref Range   Glucose-Capillary 88 65 - 99 mg/dL  Basic metabolic panel     Status: Abnormal   Collection Time: 10/21/16  8:38 AM  Result Value Ref Range   Sodium 150 (H) 135 - 145 mmol/L   Potassium 3.5 3.5 - 5.1 mmol/L   Chloride 113 (H) 101 - 111 mmol/L   CO2 23 22 - 32 mmol/L   Glucose, Bld 94 65 - 99 mg/dL   BUN 23 (H) 6 - 20 mg/dL   Creatinine, Ser 1.34 (H) 0.61 - 1.24 mg/dL   Calcium 8.2 (L) 8.9 - 10.3 mg/dL   GFR calc non Af Amer 50 (L) >60 mL/min   GFR calc Af Amer 58 (L) >60 mL/min    Comment: (NOTE) The eGFR has been calculated using the CKD EPI equation. This calculation has not been validated in all clinical situations. eGFR's persistently <60 mL/min signify possible Chronic Kidney Disease.    Anion gap 14 5 - 15  Glucose, capillary     Status: Abnormal   Collection Time: 10/21/16 11:40 AM  Result Value Ref Range   Glucose-Capillary 177 (H) 65 - 99 mg/dL  Glucose, capillary     Status: Abnormal   Collection Time: 10/21/16  4:27 PM  Result  Value Ref Range   Glucose-Capillary 59 (L) 65 - 99 mg/dL  Glucose, capillary     Status: None   Collection Time: 10/21/16  5:03 PM  Result Value Ref Range   Glucose-Capillary 83 65 - 99 mg/dL  Glucose, capillary     Status: Abnormal   Collection Time: 10/21/16  9:24 PM  Result Value Ref Range   Glucose-Capillary 335 (H) 65 - 99 mg/dL  Glucose, capillary     Status: Abnormal   Collection Time: 10/22/16  6:29 AM  Result Value Ref Range   Glucose-Capillary 187 (H) 65 - 99 mg/dL      General: NAD. Vital signs reviewed.  Psych: Unable to assess due to aphasia Heart: RRR. Lungs: CTA bilaterally. Abdomen: Positive bowel sounds, soft  Skin: Erythema on dorsum of left foot.    Neurologic: Alert Expressive language deficits, garbled speech Inconsistently following commands, moving b/l UE and LLE.  Follows some simple commands Unable to assess MMT and sensation due to language Musculoskeletal: No edema. No tenderness.  Assessment/Plan: 1. 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 Physiatrist is providing close team supervision and 24 hour management of active medical problems listed below. Physiatrist and rehab team continue to assess barriers to discharge/monitor patient progress toward functional and medical goals. FIM: Function - Bathing Position: Shower Body parts bathed by patient: Right arm, Left arm, Chest, Abdomen Body parts bathed by helper: Front perineal area, Buttocks, Back, Right lower leg, Left lower leg, Right upper leg, Left upper leg Bathing not applicable: Right lower leg, Left lower leg Assist Level:  (Mod assist)  Function- Upper Body Dressing/Undressing What is the patient wearing?: Pull over shirt/dress Pull over shirt/dress - Perfomed by patient: Pull shirt over trunk, Thread/unthread left sleeve, Put head  through opening, Thread/unthread right sleeve Pull over shirt/dress - Perfomed by helper: Thread/unthread right sleeve Assist Level: Supervision or verbal cues, Set  up Set up : To obtain clothing/put away Function - Lower Body Dressing/Undressing What is the patient wearing?: Pants, Maryln Manuel, Shoes Position: Wheelchair/chair at [employer] - Performed by helper: Thread/unthread right underwear leg, Thread/unthread left underwear leg, Pull underwear up/down Pants- Performed by patient: Thread/unthread right pants leg, Thread/unthread left pants leg, Pull pants up/down Pants- Performed by helper: Thread/unthread right pants leg, Pull pants up/down Non-skid slipper socks- Performed by patient: Don/doff left sock Non-skid slipper socks- Performed by helper: Don/doff right sock, Don/doff left sock Shoes - Performed by helper: Don/doff right shoe, Don/doff left shoe, Fasten right, Fasten left TED Hose - Performed by helper: Don/doff right TED hose, Don/doff left TED hose Assist for footwear: Maximal assist Assist for lower body dressing: 2 Helpers  Function - Toileting Toileting activity did not occur: No continent bowel/bladder event Toileting steps completed by helper: Adjust clothing prior to toileting, Performs perineal hygiene, Adjust clothing after toileting Toileting Assistive Devices: Other (comment) (holding onto sink) Assist level: Touching or steadying assistance (Pt.75%)  Function - Air cabin crew transfer activity did not occur: N/A Toilet transfer assistive device: Bedside commode Assist level to toilet: 2 helpers Assist level from toilet: 2 helpers Assist level to bedside commode (at bedside): 2 helpers Assist level from bedside commode (at bedside): 2 helpers  Function - Chair/bed transfer Chair/bed transfer activity did not occur: Safety/medical concerns Chair/bed transfer method: Lateral scoot Chair/bed transfer assist level: Maximal assist (Pt 25 - 49%/lift  and lower) Chair/bed transfer assistive device: Armrests Mechanical lift: Stedy Chair/bed transfer details: Manual facilitation for weight shifting, Tactile cues for posture, Verbal cues for sequencing, Verbal cues for technique, Manual facilitation for placement  Function - Locomotion: Wheelchair Will patient use wheelchair at discharge?: Yes Type: Manual Wheelchair activity did not occur: Safety/medical concerns Max wheelchair distance: 30' Assist Level: Maximal assistance (Pt 25 - 49%) Wheel 50 feet with 2 turns activity did not occur: Safety/medical concerns Assist Level: Total assistance (Pt < 25%) Wheel 150 feet activity did not occur: Safety/medical concerns Turns around,maneuvers to table,bed, and toilet,negotiates 3% grade,maneuvers on rugs and over doorsills: No Function - Locomotion: Ambulation Ambulation activity did not occur: Safety/medical concerns Assistive device: Walker-rolling Max distance: 110' Assist level: 2 helpers (mod A with +2 for w/c follow) Walk 10 feet activity did not occur: Safety/medical concerns Assist level: 2 helpers Walk 50 feet with 2 turns activity did not occur: Safety/medical concerns Assist level: 2 helpers Walk 150 feet activity did not occur: Safety/medical concerns Walk 10 feet on uneven surfaces activity did not occur: Safety/medical concerns  Function - Comprehension Comprehension: Auditory Comprehension assist level: Understands basic 25 - 49% of the time/ requires cueing 50 - 75% of the time  Function - Expression Expression: Nonverbal Expression assist level: Expresses basic 25 - 49% of the time/requires cueing 50 - 75% of the time. Uses single words/gestures.  Function - Social Interaction Social Interaction assist level: Interacts appropriately 50 - 74% of the time - May be physically or verbally inappropriate.  Function - Problem Solving Problem solving assist level: Solves basic 25 - 49% of the time - needs direction more  than half the time to initiate, plan or complete simple activities  Function - Memory Memory assist level: Recognizes or recalls less than 25% of the time/requires cueing greater than 75% of the time Patient normally able to recall (first 3 days only): That he or she is in a hospital   Medical Problem List and Plan: 1.  Right-sided weakness, aphasia,  dysphagia secondary to left MCA infarct  Cont CIR, PT, OT, speech, reduced burden of care ?Pt wants DNR per nursing , patient globally aphasic, so difficult to assess this actually, to discuss with family--remains FULL CODE at present 2.  DVT Prophylaxis/Anticoagulation: Subcutaneous Lovenox, holding, plt ct recovering, stop, baby aspirin, given ongoing GI bleeding. Appreciate hematology and GI notes  Will need at least weekly CBC to monitor hemoglobin  SCDs ordered 3. Pain Management: Tylenol as needed 4. Dysphagia. Dysphagia #1 nectar liquids. Monitor for any signs of aspiration.   Added daily at bedtime IV fluids 5. Neuropsych: This patient is capable of making decisions on his own behalf. 6. Skin/Wound Care: Routine skin checks 7. Fluids/Electrolytes/Nutrition: Routine I&Os, IV fluid held, however. BUN/creatinine are creeping up, will resume 8. Aspiration pneumonia. Augmentin completed. 9. Diabetes mellitus with peripheral neuropathy. Hemoglobin A1c 7.2.   Check blood sugars before meals and at bedtime  Currently Lantus insulin 16 units daily . A.m. CBGs are in desired range  Novolog 4U with meals added 12/25, increased to 8U on 12/26, increased to [place] on 12/27  CBG's higher in mid day to PM.--increased lantus to 20u q 0800 on 12/30, will increase to 25 units daily at bedtime CBG (last 3)   Recent Labs  10/21/16 1703 10/21/16 2124 10/22/16 0629  GLUCAP 83 335* 187*    10. Hypertension/tachycardia. Lopressor 12.5 mg twice a day. Monitor heart rate with increased mobility, systolic elevation will increase Lopressor   Vitals:    10/21/16 2146 10/22/16 0500  BP: (!) 165/81 (!) 161/71  Pulse: (!) 111 100  Resp:  18  Temp:  97.6 F (36.4 C)   11. Stage III chronic kidney disease. Baseline creatinine 1.62. Follow-up chemistries, Currently at baseline  BMP Latest Ref Rng & Units 10/21/2016 10/16/2016 10/14/2016  Glucose 65 - 99 mg/dL 94 132(H) 112(H)  BUN 6 - 20 mg/dL 23(H) 18 16  Creatinine 0.61 - 1.24 mg/dL 1.34(H) 1.32(H) 1.32(H)  Sodium 135 - 145 mmol/L 150(H) 141 139  Potassium 3.5 - 5.1 mmol/L 3.5 3.2(L) 3.4(L)  Chloride 101 - 111 mmol/L 113(H) 108 106  CO2 22 - 32 mmol/L '23 24 23  '$ Calcium 8.9 - 10.3 mg/dL 8.2(L) 8.4(L) 8.4(L)   12. Mood. Depakote 250 mg BID added for bouts of agitation and restlessness. 13. Hypothyroidism. Synthroid 14. Hyperlipidemia. Lipitor 15. Anemia, acute blood loss. Also with pancytopenia related to infection, now improving.     GI consulted, spoke to GI with plans for EGD, family refused, spoke with GI again, cont PPI  Heme/Onc consulted -slow GI bleed, Heme/Onc on consult,  , improvement in hemoglobin status post transfusion Continue to monitor. Check CBC in a.m.                    16.  Leukocytosis:    afebrile x 9d      Foley replaced   17. Thrombocytopenia: Negative. Hit panel Improve, off Lovenox  SRA negative  Will cont to monitor 18. Hypoalbuminemia  Supplement started 12/23 19. Acute lower UTI-resolved  Foley to stay in place, difficult placement with tight foreskin.   Urology consult as outpt    20. CKD  At baseline  Cont to monitor  HypoK , improved, continue to supplement  Will cont to monitor   -HS IVF, may need midline , although fluid intake, improving, 880 mL on 10/19/2016, last BUN, however, was mildly elevated, repeat in a.m.  LOS (Days) 17 A FACE TO FACE EVALUATION WAS  PERFORMED  Annelle Behrendt E 10/22/2016, 10:18 AM

## 2016-10-22 NOTE — Progress Notes (Signed)
Occupational Therapy Session Note  Patient Details  Name: Douglas [name] MRN: [id number] Date of Birth: 03/15/1941  Today's Date: 10/22/2016 OT Individual Time:1415-1530 75 Minutes     Short Term Goals: Week 3:  OT Short Term Goal 1 (Week 3): Pt will complete bathing with min assist and min cues for sequencing/ideational apraxia OT Short Term Goal 2 (Week 3): Pt will transfer to toilet with max assist of 1 caregiver 3 of 4 attempts OT Short Term Goal 3 (Week 3): Pt will complete LB dressing with mod assist 3 of 4 attempts OT Short Term Goal 4 (Week 3): Pt will utilize RUE as diminished level to complete 2 grooming tasks with min cues  Skilled Therapeutic Interventions/Progress Updates:   Therapeutic activities with focus on improved attention. Transfers, gross/FMC of right hand, and static standing balance.   Pt received in bed attempting to remove TEDs unsuccessfully with bed sheets and covers disconnected and entangled around him.    OT restored TEDs and redirected pt to attend to planned activity with tactile cues and simple commands.   Pt performed bed mobility to roll to his left and sit at EOB with tactile cues to attend to and place right hand on rail to assist rolling and min assist to move right leg    Pt was able to maintain supported sitting balance with left hand on rail as therapist adjusted bed to level position.   Pt then completed stand-pivot transfer with max assist d/t heavy right lean which improved with manual facilitation to weight shift and bear weight through right leg and hip with knee placed in full extension.   Pt's w/c required moderate adjustments to restore component security and pt was escorted to rehab gym and ADL apartment to continue static standing practice, weight-shifting and transfers (to car and to low couch).   Pt requires mod vc and tactile cues throughout session to look to his right and attend to right UE and LE during transfers.   From ADL apartment pt was  escorted to gym and completed right hand functional activities (screw, unscrew large nuts and bolts) with hand-over-hand guidance to use right hand effectively.    Pt demo'd good sustained attention but again required vc to look at what he was doing during activity.    Pt was returned to bed at end of session with moderate assist to undress, using bed rail to maintain static standing balance as OT removed his pants and fastened brief securely.  Therapy Documentation Precautions:  Precautions Precautions: Fall Precaution Comments: R side inattention, strong push to R side, posterior lean Restrictions Weight Bearing Restrictions: No   Vital Signs: Therapy Vitals Temp: 98.2 F (36.8 C) Temp Source: Oral Pulse Rate: 75 Resp: 20 BP: (!) 176/75 Patient Position (if appropriate): Lying Oxygen Therapy SpO2: 94 % O2 Device: Not Delivered   Pain: None expressed  See Function Navigator for Current Functional Status.   Therapy/Group: Individual Therapy  [name] 10/23/2016, 5:47 AM

## 2016-10-22 NOTE — Progress Notes (Signed)
Physical Therapy Note  Patient Details  Name: Douglas CohoJames L Wurtzel MRN: [id number] Date of Birth: 02/27/1941 Today's Date: 10/22/2016  [id number], 75 min individual tx Pain: none communicated  Pt assisted with donning pants in bed with rolling with 1 cue, min assist, and donning sweat shirt in sitting EOB with min assist L sidelying > sit.  Min assist intermittently for static sitting balance while PT donned TEDS and shoes.  Pt became somewhat agitated as PT set up for slide board transfer, as pt was trying to communicate.  Eventantually, PT placed w/c to pt's L and her performed lateral scoot without slide board, mod/max assist. BP in sitting, 154/58, HR 109, O2 sats on room air 93%. Standing activity in static stander x 10 min x 2 working on midline orientation to decrease pusher tendencies, reaching spontaneously with R hand to pass objects from L hand to R , with hand over hand assistance, to place in bin to R.  Another activity in stander was use of bolts, washers and nuts.  Pt unable to choose and fit correctly 5/5 washers. Pt left resting in w/c with quick release belt applied and all needs within reach, at nurse's station for supervision.    See function navigator for current status.   Jaidyn Kuhl 10/22/2016, 8:13 AM

## 2016-10-23 ENCOUNTER — Inpatient Hospital Stay (HOSPITAL_COMMUNITY): Payer: Medicare Other | Admitting: Speech Pathology

## 2016-10-23 ENCOUNTER — Inpatient Hospital Stay (HOSPITAL_COMMUNITY): Payer: Medicare Other | Admitting: Physical Therapy

## 2016-10-23 ENCOUNTER — Inpatient Hospital Stay (HOSPITAL_COMMUNITY): Payer: Medicare Other | Admitting: Occupational Therapy

## 2016-10-23 DIAGNOSIS — I69320 Aphasia following cerebral infarction: Secondary | ICD-10-CM

## 2016-10-23 LAB — GLUCOSE, CAPILLARY
GLUCOSE-CAPILLARY: 119 mg/dL — AB (ref 65–99)
GLUCOSE-CAPILLARY: 186 mg/dL — AB (ref 65–99)
Glucose-Capillary: 86 mg/dL (ref 65–99)
Glucose-Capillary: 137 mg/dL — ABNORMAL HIGH (ref 65–99)

## 2016-10-23 LAB — BASIC METABOLIC PANEL
Anion gap: 11 (ref 5–15)
BUN: 21 mg/dL — ABNORMAL HIGH (ref 6–20)
CALCIUM: 8.7 mg/dL — AB (ref 8.9–10.3)
CO2: 29 mmol/L (ref 22–32)
CREATININE: 1.36 mg/dL — AB (ref 0.61–1.24)
Chloride: 110 mmol/L (ref 101–111)
GFR, EST AFRICAN AMERICAN: 57 mL/min — AB (ref >60)
GFR, EST NON AFRICAN AMERICAN: 49 mL/min — AB (ref >60)
Glucose, Bld: 155 mg/dL — ABNORMAL HIGH (ref 65–99)
Potassium: 3 mmol/L — ABNORMAL LOW (ref 3.5–5.1)
SODIUM: 150 mmol/L — AB (ref 135–145)

## 2016-10-23 LAB — CBC
HEMATOCRIT: 41.8 % (ref 39.0–52.0)
Hemoglobin: 12.7 g/dL — ABNORMAL LOW (ref 13.0–17.0)
MCH: 27.5 pg (ref 26.0–34.0)
MCHC: 30.4 g/dL (ref 30.0–36.0)
MCV: 90.5 fL (ref 78.0–100.0)
PLATELETS: 102 10*3/uL — AB (ref 150–400)
RBC: 4.62 MIL/uL (ref 4.22–5.81)
RDW: 21.5 % — ABNORMAL HIGH (ref 11.5–15.5)
WBC: 11 10*3/uL — AB (ref 4.0–10.5)

## 2016-10-23 NOTE — Progress Notes (Signed)
Speech Language Pathology Daily Session Note  Patient Details  Name: Osborn CohoJames L Ayon MRN: [id number] Date of Birth: 11/11/1940  Today's Date: 10/23/2016 SLP Individual Time: 1105-1150 SLP Individual Time Calculation (min): 45 min   Short Term Goals: Week 3: SLP Short Term Goal 1 (Week 3): Pt will answer yes/no questions for 75% accuracy with mod assist verbal cues.   SLP Short Term Goal 2 (Week 3): Pt will follow 1 step commands for 75% accuracy with mod assist verbal cues.   SLP Short Term Goal 3 (Week 3): Pt will consume dys 1 textures and honey thick liquids via teaspoon with min assist for use of swallowing precautions and minimal overt s/s of aspiration.   SLP Short Term Goal 4 (Week 3): Pt will verbalize at the word level during structured tasks for 50% accuracy with max assist multimodal cues. SLP Short Term Goal 5 (Week 3): Pt will utilize multimodal means of communication to convey needs and wants to caregivers with mod assist cues.    Skilled Therapeutic Interventions:   Skilled treatment session focused on addressing cognitive-linguistic goals. SLP facilitated session by providing Max assist verbal and tactile stimuli for arousal.  Patient able to demonstrate some increased arousal after he washed his face with a washcloth with hand-over-hand assist.  Patient declined PO and SLP was concerned that given arousal it may not be the safest time.  As a result, goals for sustained attention and initiation were addressed.  Patient matched colored blocks 1:1 after SLP handed him the correct color in ~30 % of opportunities with Max assist multimodal cues due to frequently drifting off to sleep and then at times what appeared to be just refusing to engage in task.  SLP offered other options to which patient stated "no," and pushed away.  Continue with current plan of care.    Function:  Cognition Comprehension Comprehension assist level: Understands basic 25 - 49% of the time/ requires cueing  50 - 75% of the time  Expression   Expression assist level: Expresses basic 25 - 49% of the time/requires cueing 50 - 75% of the time. Uses single words/gestures.  Social Interaction Social Interaction assist level: Interacts appropriately 50 - 74% of the time - May be physically or verbally inappropriate.  Problem Solving Problem solving assist level: Solves basic 50 - 74% of the time/requires cueing 25 - 49% of the time  Memory Memory assist level: Recognizes or recalls less than 25% of the time/requires cueing greater than 75% of the time    Pain Pain Assessment Pain Assessment: No/denies pain  Therapy/Group: Individual Therapy  Charlane FerrettiMelissa Traevon Meiring, M.A., CCC-SLP [phone]  Chirstopher Iovino 10/23/2016, 12:36 PM

## 2016-10-23 NOTE — Progress Notes (Signed)
Occupational Therapy Session Note  Patient Details  Name: Douglas [name] MRN: [id number] Date of Birth: 10/11/1941  Today's Date: 10/23/2016 OT Individual Time: [id number] OT Individual Time Calculation (min): 55 min     Short Term Goals: Week 3:  OT Short Term Goal 1 (Week 3): Pt will complete bathing with min assist and min cues for sequencing/ideational apraxia OT Short Term Goal 2 (Week 3): Pt will transfer to toilet with max assist of 1 caregiver 3 of 4 attempts OT Short Term Goal 3 (Week 3): Pt will complete LB dressing with mod assist 3 of 4 attempts OT Short Term Goal 4 (Week 3): Pt will utilize RUE as diminished level to complete 2 grooming tasks with min cues  Skilled Therapeutic Interventions/Progress Updates:    Treatment session with focus on ADL retraining with functional transfers, sit > stand, midline orientation, and attention to RUE/Rt environment.  Pt asleep in bed upon arrival, requiring increased time to fully arouse.  Completed bed mobility with min assist and then stand pivot transfer to pt's Rt with max assist due to decreased trunk control and pushing tendencies.  Attempted squat pivot transfer into room shower with use of grab bars to assist with sequencing.  Pt required max multimodal cues for sequencing and weight shifting for transfer, with pt initially sitting on therapist's knee instead of completing full pivot into shower (shower on pt's Rt side).  Returned to w/c, max cues to visually attend to seat in shower followed by improved squat pivot transfer into shower.  Increased assist with bathing this session as pt demonstrating increased perseveration on washing hands, requiring tactile cues for sequencing.  Continued ideational apraxia throughout shower with decreased ability to utilize hand held shower head, but demonstrating great sequencing with drying off post shower.  Mod assist sit > stand with UE support on grab bar or sink during dressing.  Pt requiring  increased assist for standing balance when attempting to pull up brief or pants due to increased pushing tendencies without UE support.  Utilized mirror to attempt to reorient to midline with inconsistent results.  Left upright in reclining w/c with quick release belt donned with RN present to provide morning meds.  Therapy Documentation Precautions:  Precautions Precautions: Fall Precaution Comments: R side inattention, strong push to R side, posterior lean Restrictions Weight Bearing Restrictions: No Pain:  Pt with no c/o pain  See Function Navigator for Current Functional Status.   Therapy/Group: Individual Therapy  Rosalio LoudHOXIE, [name] 10/23/2016, 8:41 AM

## 2016-10-23 NOTE — Progress Notes (Signed)
Physical Therapy Session Note  Patient Details  Name: Douglas [name] MRN: [id number] Date of Birth: 1941/06/25  Today's Date: 10/23/2016 PT Individual Time: 1540-1620 PT Individual Time Calculation (min): 40 min    Short Term Goals: Week 3:  PT Short Term Goal 1 (Week 3): Pt will ambulate x100' with LRAD and mod assist.  PT Short Term Goal 2 (Week 3): Pt will self propel w/c 25' with min assist.  PT Short Term Goal 3 (Week 3): Pt will maintain static standing balance for 3 minutes during a functional task with min assist   Skilled Therapeutic Interventions/Progress Updates:    Pt resting in bed on arrival, no c/o pain, and agreeable to therapy session.  Session focus on visual scanning, sustained attention, following 1-step commands, posture, midline orientation, R attention, transfers, and ambulation.    Pt performs supine>sit with mod assist to elevate trunk and scoot to EOB.  Max assist for squat/pivot to pt's R into w/c and mod assist with increased time and cues for stand/pivot from w/c>therapy mat towards pt's L.  Attempted to engage pt in pipe tree activity but pt unable to complete without total assist during this session.  Transition to activity focus on upright posture, visual scanning to R and R attention, and sustained attention to task in a moderately distracting environment while attempting to reach for pipe tree pieces and return them to their container.  Pt requires max assist to attend to and complete task.  Sit<>stand from therapy mat with min assist when pt pushes up with LUE.  Gait training x50' with RW and mod/max assist as pt demonstrates increasing R push with fatigue that he is unable to correct despite cues.  Pt does demo improved R step length this session.  Pt returned to w/c and positioned upright in nursing station with QRB in place and needs met.   Therapy Documentation Precautions:  Precautions Precautions: Fall Precaution Comments: R side inattention, strong  push to R side, posterior lean Restrictions Weight Bearing Restrictions: No  See Function Navigator for Current Functional Status.   Therapy/Group: Individual Therapy  Earnest Conroy Penven-Crew 10/23/2016, 4:36 PM

## 2016-10-23 NOTE — Progress Notes (Addendum)
Physical Therapy Session Note  Patient Details  Name: Douglas [name] MRN: [id number] Date of Birth: 06/16/1941  Today's Date: 10/23/2016 PT Individual Time: 0900-1000 PT Individual Time Calculation (min): 60 min    Short Term Goals: Week 3:  PT Short Term Goal 1 (Week 3): Pt will ambulate x100' with LRAD and mod assist.  PT Short Term Goal 2 (Week 3): Pt will self propel w/c 25' with min assist.  PT Short Term Goal 3 (Week 3): Pt will maintain static standing balance for 3 minutes during a functional task with min assist   Skilled Therapeutic Interventions/Progress Updates:    Pt presented in nsg station sleeping. Propelled to rehab gym with modA as pt with decreased attention to task despite multimodal cues. Standing at standing frame x 10 min with reaching for objects, use of LUE with max cues to use RUE.  Mod cues for correcting to neutral in standing.  Performed squat pivot transfer from w/c with maxA, max cues for initiating and sequencing. Sitting balance to maintain neutral x 7 min with use of mirror.  Pt requiring max cues for attention to task.  Squat pivot transfer return to w/c, maxA however improved sequencing. Pt returned to nsg station with QRB placed.   Therapy Documentation Precautions:  Precautions Precautions: Fall Precaution Comments: R side inattention, strong push to R side, posterior lean Restrictions Weight Bearing Restrictions: No General:   Vital Signs: Oxygen Therapy SpO2: 93 % O2 Device: Not Delivered   See Function Navigator for Current Functional Status.   Therapy/Group: Individual Therapy  Douglas [name] 10/23/2016, 12:20 PM

## 2016-10-23 NOTE — Progress Notes (Signed)
Subjective/Complaints: Working with OT requiring mod max assist for showering.                               Unable to obtain ROS due to aphasia   Objective: Vital Signs: Blood pressure (!) 176/75, pulse 75, temperature 98.2 F (36.8 C), temperature source Oral, resp. rate 20, weight 74.4 kg (164 lb), SpO2 94 %. No results found. Results for orders placed or performed during the hospital encounter of 10/05/16 (from the past 72 hour(s))  Glucose, capillary     Status: Abnormal   Collection Time: 10/20/16 12:30 PM  Result Value Ref Range   Glucose-Capillary 178 (H) 65 - 99 mg/dL  Glucose, capillary     Status: Abnormal   Collection Time: 10/20/16  4:53 PM  Result Value Ref Range   Glucose-Capillary 114 (H) 65 - 99 mg/dL  Glucose, capillary     Status: Abnormal   Collection Time: 10/20/16  9:06 PM  Result Value Ref Range   Glucose-Capillary 170 (H) 65 - 99 mg/dL  Glucose, capillary     Status: None   Collection Time: 10/21/16  6:33 AM  Result Value Ref Range   Glucose-Capillary 88 65 - 99 mg/dL  Basic metabolic panel     Status: Abnormal   Collection Time: 10/21/16  8:38 AM  Result Value Ref Range   Sodium 150 (H) 135 - 145 mmol/L   Potassium 3.5 3.5 - 5.1 mmol/L   Chloride 113 (H) 101 - 111 mmol/L   CO2 23 22 - 32 mmol/L   Glucose, Bld 94 65 - 99 mg/dL   BUN 23 (H) 6 - 20 mg/dL   Creatinine, Ser 1.34 (H) 0.61 - 1.24 mg/dL   Calcium 8.2 (L) 8.9 - 10.3 mg/dL   GFR calc non Af Amer 50 (L) >60 mL/min   GFR calc Af Amer 58 (L) >60 mL/min    Comment: (NOTE) The eGFR has been calculated using the CKD EPI equation. This calculation has not been validated in all clinical situations. eGFR's persistently <60 mL/min signify possible Chronic Kidney Disease.    Anion gap 14 5 - 15  Glucose, capillary     Status: Abnormal   Collection Time: 10/21/16 11:40 AM  Result Value Ref Range   Glucose-Capillary 177 (H) 65 - 99 mg/dL  Glucose, capillary     Status: Abnormal   Collection  Time: 10/21/16  4:27 PM  Result Value Ref Range   Glucose-Capillary 59 (L) 65 - 99 mg/dL  Glucose, capillary     Status: None   Collection Time: 10/21/16  5:03 PM  Result Value Ref Range   Glucose-Capillary 83 65 - 99 mg/dL  Glucose, capillary     Status: Abnormal   Collection Time: 10/21/16  9:24 PM  Result Value Ref Range   Glucose-Capillary 335 (H) 65 - 99 mg/dL  Glucose, capillary     Status: Abnormal   Collection Time: 10/22/16  6:29 AM  Result Value Ref Range   Glucose-Capillary 187 (H) 65 - 99 mg/dL  Glucose, capillary     Status: Abnormal   Collection Time: 10/22/16 11:54 AM  Result Value Ref Range   Glucose-Capillary 197 (H) 65 - 99 mg/dL  Glucose, capillary     Status: Abnormal   Collection Time: 10/22/16  5:24 PM  Result Value Ref Range   Glucose-Capillary 105 (H) 65 - 99 mg/dL  Glucose, capillary  Status: Abnormal   Collection Time: 10/22/16  9:36 PM  Result Value Ref Range   Glucose-Capillary 198 (H) 65 - 99 mg/dL  Glucose, capillary     Status: Abnormal   Collection Time: 10/23/16  7:03 AM  Result Value Ref Range   Glucose-Capillary 119 (H) 65 - 99 mg/dL      General: NAD. Vital signs reviewed.  Psych: Unable to assess due to aphasia Heart: RRR. Lungs: CTA bilaterally. Abdomen: Positive bowel sounds, soft  Skin: Erythema on dorsum of left foot.    Neurologic: Alert Expressive language deficits, garbled speech Inconsistently following commands, moving b/l UE and LLE.  Follows some simple commands Unable to assess MMT and sensation due to language Musculoskeletal: No edema. No tenderness.  Assessment/Plan: 1. 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 Physiatrist is providing close team supervision and 24 hour management of active medical problems listed below. Physiatrist and rehab team continue to  assess barriers to discharge/monitor patient progress toward functional and medical goals. FIM: Function - Bathing Position: Shower Body parts bathed by patient: Right arm, Left arm, Chest, Abdomen Body parts bathed by helper: Front perineal area, Buttocks, Right upper leg, Left upper leg, Right lower leg, Left lower leg, Back Bathing not applicable: Right lower leg, Left lower leg Assist Level:  (Mod assist)  Function- Upper Body Dressing/Undressing What is the patient wearing?: Pull over shirt/dress Pull over shirt/dress - Perfomed by patient: Thread/unthread left sleeve, Put head through opening Pull over shirt/dress - Perfomed by helper: Thread/unthread right sleeve, Pull shirt over trunk Assist Level:  (Mod assist) Set up : To obtain clothing/put away Function - Lower Body Dressing/Undressing What is the patient wearing?: Pants, Faythe [place], Shoes Position: Wheelchair/chair at [employer] - Performed by helper: Thread/unthread right underwear leg, Thread/unthread left underwear leg, Pull underwear up/down Pants- Performed by patient: Thread/unthread left pants leg Pants- Performed by helper: Thread/unthread right pants leg, Pull pants up/down, Fasten/unfasten pants Non-skid slipper socks- Performed by patient: Don/doff left sock Non-skid slipper socks- Performed by helper: Don/doff right sock, Don/doff left sock Shoes - Performed by helper: Don/doff right shoe, Don/doff left shoe, Fasten right, Fasten left TED Hose - Performed by helper: Don/doff right TED hose, Don/doff left TED hose Assist for footwear: Maximal assist Assist for lower body dressing:  (Total assist)  Function - Toileting Toileting activity did not occur: No continent bowel/bladder event Toileting steps completed by helper: Adjust clothing prior to toileting, Performs perineal hygiene, Adjust clothing after toileting Toileting Assistive Devices: Other (comment) (holding onto sink) Assist level: Touching or  steadying assistance (Pt.75%)  Function - Archivist transfer activity did not occur: N/A Toilet transfer assistive device: Bedside commode Assist level to toilet: 2 helpers Assist level from toilet: 2 helpers Assist level to bedside commode (at bedside): 2 helpers Assist level from bedside commode (at bedside): 2 helpers  Function - Chair/bed transfer Chair/bed transfer activity did not occur: Safety/medical concerns Chair/bed transfer method: Stand pivot Chair/bed transfer assist level: Maximal assist (Pt 25 - 49%/lift and lower) Chair/bed transfer assistive device: Armrests, Bedrails Mechanical lift: Stedy Chair/bed transfer details: Manual facilitation for weight shifting, Tactile cues for posture, Verbal cues for sequencing, Verbal cues for technique, Manual facilitation for placement  Function - Locomotion: Wheelchair Will patient use wheelchair at discharge?: Yes Type: Manual Wheelchair activity did not occur: Safety/medical concerns Max wheelchair distance: 59'  Assist Level: Maximal assistance (Pt 25 - 49%) Wheel 50 feet with 2 turns activity did not occur: Safety/medical concerns Assist Level: Total assistance (Pt < 25%) Wheel 150 feet activity did not occur: Safety/medical concerns Turns around,maneuvers to table,bed, and toilet,negotiates 3% grade,maneuvers on rugs and over doorsills: No Function - Locomotion: Ambulation Ambulation activity did not occur: Safety/medical concerns Assistive device: Walker-rolling Max distance: 110' Assist level: 2 helpers (mod A with +2 for w/c follow) Walk 10 feet activity did not occur: Safety/medical concerns Assist level: 2 helpers Walk 50 feet with 2 turns activity did not occur: Safety/medical concerns Assist level: 2 helpers Walk 150 feet activity did not occur: Safety/medical concerns Walk 10 feet on uneven surfaces activity did not occur: Safety/medical concerns  Function - Comprehension Comprehension:  Auditory Comprehension assist level: Understands basic 25 - 49% of the time/ requires cueing 50 - 75% of the time  Function - Expression Expression: Nonverbal Expression assist level: Expresses basic 25 - 49% of the time/requires cueing 50 - 75% of the time. Uses single words/gestures.  Function - Social Interaction Social Interaction assist level: Interacts appropriately 50 - 74% of the time - May be physically or verbally inappropriate.  Function - Problem Solving Problem solving assist level: Solves basic 50 - 74% of the time/requires cueing 25 - 49% of the time  Function - Memory Memory assist level: Recognizes or recalls less than 25% of the time/requires cueing greater than 75% of the time Patient normally able to recall (first 3 days only): That he or she is in a hospital   Medical Problem List and Plan: 1.  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  DVT Prophylaxis/Anticoagulation: Subcutaneous Lovenox, holding, plt ct recovering, stop, baby aspirin, given ongoing GI bleeding. Appreciate hematology and GI notes  Will need at least weekly CBC to monitor hemoglobin  SCDs ordered 3. Pain Management: Tylenol as needed 4. Dysphagia. Dysphagia #1 nectar liquids. Monitor for any signs of aspiration.   Added daily at bedtime IV fluids 5. Neuropsych: This patient is capable of making decisions on his own behalf. 6. Skin/Wound Care: Routine skin checks 7. Fluids/Electrolytes/Nutrition: Routine I&Os, IV fluid held, however. BUN/creatinine are creeping up, will resume 8. Aspiration pneumonia. Augmentin completed. 9. Diabetes mellitus with peripheral neuropathy. Hemoglobin A1c 7.2.   Check blood sugars before meals and at bedtime  Currently Lantus insulin 16 units  daily . A.m. CBGs are in desired range  Novolog 4U with meals added 12/25, increased to 8U on 12/26, increased to [place] on 12/27  CBG's higher in mid day to PM.--increased lantus to 20u q 0800 on 12/30, will increase to 25 units daily at bedtime CBG (last 3)   Recent Labs  10/22/16 1724 10/22/16 2136 10/23/16 0703  GLUCAP 105* 198* 119*    10. Hypertension/tachycardia. Lopressor 12.5 mg twice a day. Monitor heart rate with increased mobility, systolic elevation will increase Lopressor   Vitals:   10/22/16 1631 10/23/16 0409  BP: (!) 162/58 (!) 176/75  Pulse: 96 75  Resp: 20 20  Temp: 97.9 F (36.6 C) 98.2 F (36.8 C)   11. Stage III chronic kidney disease. Baseline creatinine 1.62. Follow-up chemistries, Currently at baseline  BMP Latest Ref Rng & Units 10/21/2016 10/16/2016 10/14/2016  Glucose 65 - 99 mg/dL 94  132(H) 112(H)  BUN 6 - 20 mg/dL 23(H) 18 16  Creatinine 0.61 - 1.24 mg/dL 1.34(H) 1.32(H) 1.32(H)  Sodium 135 - 145 mmol/L 150(H) 141 139  Potassium 3.5 - 5.1 mmol/L 3.5 3.2(L) 3.4(L)  Chloride 101 - 111 mmol/L 113(H) 108 106  CO2 22 - 32 mmol/L '23 24 23  '$ Calcium 8.9 - 10.3 mg/dL 8.2(L) 8.4(L) 8.4(L)   12. Mood. Depakote 250 mg BID added for bouts of agitation and restlessness. 13. Hypothyroidism. Synthroid 14. Hyperlipidemia. Lipitor 15. Anemia, acute blood loss. Also with pancytopenia related to infection, now improving.     GI consulted, spoke to GI with plans for EGD, family refused, spoke with GI again, cont PPI  Heme/Onc consulted -slow GI bleed, Heme/Onc on consult,  , improvement in hemoglobin status post transfusion Continue to monitor. Check CBC in a.m.                    16.  Leukocytosis:    afebrile x 9d       voiding trial , difficult to place a Foley, may need urology if unable to tolerate. I/O cath   17. Thrombocytopenia: Negative. Hit panel Improve, off Lovenox  SRA negative  Will cont to monitor 18. Hypoalbuminemia  Supplement started 12/23 19.  Acute lower UTI-resolved  Foley to stay in place, difficult placement with tight foreskin.   Urology consult as outpt    20. CKD  At baseline  Cont to monitor  HypoK , improved, continue to supplement  Will cont to monitor   -HS IVF, may need midline , although fluid intake, improving, 880 mL on 10/19/2016, last BUN, however, was mildly elevated, repeat today 21.  Hypernatremia and Chloremia, recheck BMET today LOS (Days) [place] E 10/23/2016, 8:45 AM

## 2016-10-24 ENCOUNTER — Inpatient Hospital Stay (HOSPITAL_COMMUNITY): Payer: Medicare Other | Admitting: *Deleted

## 2016-10-24 ENCOUNTER — Inpatient Hospital Stay (HOSPITAL_COMMUNITY): Payer: Medicare Other | Admitting: Occupational Therapy

## 2016-10-24 ENCOUNTER — Inpatient Hospital Stay (HOSPITAL_COMMUNITY): Payer: Medicare Other | Admitting: Speech Pathology

## 2016-10-24 ENCOUNTER — Inpatient Hospital Stay (HOSPITAL_COMMUNITY): Payer: Medicare Other | Admitting: Physical Therapy

## 2016-10-24 LAB — GLUCOSE, CAPILLARY
GLUCOSE-CAPILLARY: 61 mg/dL — AB (ref 65–99)
GLUCOSE-CAPILLARY: 234 mg/dL — AB (ref 65–99)
GLUCOSE-CAPILLARY: 30 mg/dL — AB (ref 65–99)
Glucose-Capillary: 76 mg/dL (ref 65–99)
Glucose-Capillary: 111 mg/dL — ABNORMAL HIGH (ref 65–99)
Glucose-Capillary: 29 mg/dL — CL (ref 65–99)
Glucose-Capillary: 199 mg/dL — ABNORMAL HIGH (ref 65–99)
Glucose-Capillary: 46 mg/dL — ABNORMAL LOW (ref 65–99)
Glucose-Capillary: 191 mg/dL — ABNORMAL HIGH (ref 65–99)
Glucose-Capillary: 190 mg/dL — ABNORMAL HIGH (ref 65–99)
Glucose-Capillary: 167 mg/dL — ABNORMAL HIGH (ref 65–99)

## 2016-10-24 MED ORDER — DEXTROSE 50 % IV SOLN
INTRAVENOUS | Status: AC
  Administered 2016-10-24: 50 mL
  Filled 2016-10-24: qty 50

## 2016-10-24 MED ORDER — POTASSIUM CHLORIDE 20 MEQ/15ML (10%) PO SOLN
20.0000 meq | Freq: Two times a day (BID) | ORAL | Status: DC
  Administered 2016-10-24 – 2016-10-26 (×4): 20 meq via ORAL
  Filled 2016-10-24 (×4): qty 15

## 2016-10-24 NOTE — Progress Notes (Signed)
Speech Language Pathology Daily Session Note  Patient Details  Name: Douglas [name] MRN: [id number] Date of Birth: 05/23/1941  Today's Date: 10/24/2016 SLP Individual Time: 1130-1155 SLP Individual Time Calculation (min): 25 min   Short Term Goals: Week 3: SLP Short Term Goal 1 (Week 3): Pt will answer yes/no questions for 75% accuracy with mod assist verbal cues.   SLP Short Term Goal 2 (Week 3): Pt will follow 1 step commands for 75% accuracy with mod assist verbal cues.   SLP Short Term Goal 3 (Week 3): Pt will consume dys 1 textures and honey thick liquids via teaspoon with min assist for use of swallowing precautions and minimal overt s/s of aspiration.   SLP Short Term Goal 4 (Week 3): Pt will verbalize at the word level during structured tasks for 50% accuracy with max assist multimodal cues. SLP Short Term Goal 5 (Week 3): Pt will utilize multimodal means of communication to convey needs and wants to caregivers with mod assist cues.    Skilled Therapeutic Interventions:  Pt was seen for skilled ST targeting cognitive-linguistic goals.  SLP facilitated the session with a basic sorting task targeting attention to task and following commands.  Pt was able to sustain his attention to task for ~1 minute intervals with mod-max assist multimodal cues for redirection.  Pt followed 1-step commands within task for >50% accuracy with mod-max assist multimodal cues.  Overall, pt was difficult to engage today and repeatedly told the therapist "no" when attempting to engage him in therapeutic tasks.  As a result, session was ended early.  Pt was left in wheelchair with quick release belt donned at nursing station.  Continue per current plan of care.    Function:  Eating Eating          Cognition Comprehension Comprehension assist level: Understands basic 25 - 49% of the time/ requires cueing 50 - 75% of the time  Expression Expression assistive device: Other (Comment) (gestures) Expression  assist level: Expresses basic 25 - 49% of the time/requires cueing 50 - 75% of the time. Uses single words/gestures.  Social Interaction Social Interaction assist level: Interacts appropriately 25 - 49% of time - Needs frequent redirection.  Problem Solving Problem solving assist level: Solves basic 50 - 74% of the time/requires cueing 25 - 49% of the time  Memory Memory assist level: Recognizes or recalls less than 25% of the time/requires cueing greater than 75% of the time    Pain Pain Assessment Pain Assessment: No/denies pain  Therapy/Group: Individual Therapy  Jhair Witherington, Melanee SpryNicole L 10/24/2016, 3:08 PM

## 2016-10-24 NOTE — Progress Notes (Signed)
Speech Language Pathology Daily Session Note  Patient Details  Name: Douglas [name] MRN: [id number] Date of Birth: 09/30/1941  Today's Date: 10/24/2016 SLP Individual Time: 1300-1330 SLP Individual Time Calculation (min): 30 min   Short Term Goals:Week 3: SLP Short Term Goal 1 (Week 3): Pt will answer yes/no questions for 75% accuracy with mod assist verbal cues.   SLP Short Term Goal 2 (Week 3): Pt will follow 1 step commands for 75% accuracy with mod assist verbal cues.   SLP Short Term Goal 3 (Week 3): Pt will consume dys 1 textures and honey thick liquids via teaspoon with min assist for use of swallowing precautions and minimal overt s/s of aspiration.   SLP Short Term Goal 4 (Week 3): Pt will verbalize at the word level during structured tasks for 50% accuracy with max assist multimodal cues. SLP Short Term Goal 5 (Week 3): Pt will utilize multimodal means of communication to convey needs and wants to caregivers with mod assist cues.    Skilled Therapeutic Interventions:   Skilled treatment session focused on addressing speech and swallow goals. Upon SLP arrival patient was declining PO with full supervision with a staff member.  SLP facilitated session by providing a less distracting environment and options to facilitate increased participation with PO.  Patient consumed advanced trials of nectar-thick liquids via spoon and cup with inconsistent attention to and initiation of swallow response.  Cup sips that the patient controlled appeared overall more timely as compared to SLP administered teaspoon sips; given ataxia.  Patient with no overt s/s of aspiration throughout session.  SLP also facilitated session with Max multimodal cues to pair verbal yes/no responses with head nods; however, patient only successful with verbal "no."  Continue with current plan of care.    Function:  Eating Eating   Modified Consistency Diet: Yes Eating Assist Level: Helper feeds patient;Supervision or  verbal cues;Helper checks for pocketed food   Eating Set Up Assist For: Opening containers Helper Scoops Food on Utensil: Every scoop Helper Brings Food to Mouth: Every scoop   Cognition Comprehension Comprehension assist level: Understands basic 25 - 49% of the time/ requires cueing 50 - 75% of the time  Expression Expression assistive device: Other (Comment) (gestures) Expression assist level: Expresses basic 25 - 49% of the time/requires cueing 50 - 75% of the time. Uses single words/gestures.  Social Interaction Social Interaction assist level: Interacts appropriately 50 - 74% of the time - May be physically or verbally inappropriate.  Problem Solving Problem solving assist level: Solves basic 50 - 74% of the time/requires cueing 25 - 49% of the time  Memory Memory assist level: Recognizes or recalls less than 25% of the time/requires cueing greater than 75% of the time    Pain Pain Assessment Pain Assessment: No/denies pain  Therapy/Group: Individual Therapy  Douglas [name], M.A., CCC-SLP [phone]  Douglas [name] 10/24/2016, 1:39 PM

## 2016-10-24 NOTE — Progress Notes (Signed)
Physical Therapy Session Note  Patient Details  Name: Douglas [name] MRN: [id number] Date of Birth: 03/31/1941  Today's Date: 10/24/2016 PT Individual Time: 1330-1440 PT Individual Time Calculation (min): 70 min    Short Term Goals: Week 3:  PT Short Term Goal 1 (Week 3): Pt will ambulate x100' with LRAD and mod assist.  PT Short Term Goal 2 (Week 3): Pt will self propel w/c 25' with min assist.  PT Short Term Goal 3 (Week 3): Pt will maintain static standing balance for 3 minutes during a functional task with min assist   Skilled Therapeutic Interventions/Progress Updates:    Session focus on attention, initiation, functional transfers, midline orientation, w/c propulsion and following directions.  Pt with decreased sustained attention today compared to previous sessions, unable to attend to any task greater than 30 seconds without max/total cues to redirect.  W/C propulsion x30' with max assist to attend to R side using L hemi technique.  Pt reports needing to use to bathroom so total assist back to room for urgency.  Sit<>stand from w/c and BSc brought underneath pt due to strong R push with pt unable to correct safely and attempting to sit in midair.  (-) bowel movement, total assist for toileting and transfer.  NMR in therapy gym focus on sit<>stand with max>total assist and maintaining upright posture and midline orientation with verbal cues to "tap L hip to the mat" range from mod to total assist for pt to maintain standing.  Pt returned to room at end of session and positioned in bed with mod assist.  PT placed towel rolls underneath w/c cushion for improved positioning at next available OOB opportunity.  Pt left with call blel in reach and needs met.   Therapy Documentation Precautions:  Precautions Precautions: Fall Precaution Comments: R side inattention, strong push to R side, posterior lean Restrictions Weight Bearing Restrictions: No   See Function Navigator for Current  Functional Status.   Therapy/Group: Individual Therapy  Earnest Conroy Penven-Crew 10/24/2016, 4:41 PM

## 2016-10-24 NOTE — Progress Notes (Signed)
Occupational Therapy Session Note  Patient Details  Name: Douglas [name] MRN: [id number] Date of Birth: 05-13-1941  Today's Date: 10/24/2016 OT Individual Time: 1000-1057 OT Individual Time Calculation (min): 57 min     Short Term Goals: Week 2:  OT Short Term Goal 1 (Week 2): Pt will maintain unsupported sitting balance with min assist for 2 mins to engage in self-care task OT Short Term Goal 1 - Progress (Week 2): Met OT Short Term Goal 2 (Week 2): Pt will transfer to toilet with max assist of 1 caregiver OT Short Term Goal 2 - Progress (Week 2): Progressing toward goal OT Short Term Goal 3 (Week 2): Pt will complete sit > stand with max assist of one caregiver to progress towards LB dressing at sit > stand level OT Short Term Goal 3 - Progress (Week 2): Met OT Short Term Goal 4 (Week 2): Pt will complete LB dressing with max assist  OT Short Term Goal 4 - Progress (Week 2): Met  Skilled Therapeutic Interventions/Progress Updates:    Pt resting in bed upon arrival and initiated sitting EOB when offered to get OOB.  Pt required mod A for bed mobility and mod A for stand pivot transfer to w/c.  Pt engaged in BADL retraining including bathing and dressing with sit<>stand from w/c at sink.  Pt initiated bathing tasks when presented with supplies but required max verbal cues for sequencing.  Pt perseverated on washing his hands and then his arms when directed.  Pt required mod A for sit<>stand from w/c at sink.  Pt initially required min A for standing balance at sink but assistance increased to max as pt fatigued.  Pt required max multimodal cues for weight shifts to L.  Pt initiated donning clothing items but required assistance to complete tasks.  Pt remained in w/c with QRB in place at nursing station.  Focus on activity tolerance, sit<>stand, standing balance, task initiation, sequencing, and safety awareness to increase independence with BADLs.  Therapy Documentation Precautions:   Precautions Precautions: Fall Precaution Comments: R side inattention, strong push to R side, posterior lean Restrictions Weight Bearing Restrictions: No Pain:  Pt with no s/s of pain and denied pain when questioned  See Function Navigator for Current Functional Status.   Therapy/Group: Individual Therapy  Leroy Libman 10/24/2016, 11:04 AM

## 2016-10-24 NOTE — Progress Notes (Signed)
Foley not removed this shift.  Discussed with D. Angiulli.

## 2016-10-24 NOTE — Significant Event (Signed)
Hypoglycemic Event  CBG: 29  Treatment: D50 IV 50 mL and monitor CBG  Symptoms: None Follow-up CBG: Time:1800 CBG Result: 46 CBG Time: 1800   CBG Result: 46 CBG Time: 1827   CBG Result: 199 CBG Time: 1857   CBG Result: 191 CBG Time: 1949   CBG Result 190   Possible Reasons for Event: Inadequate meal intake, pt refused to eat lunch after multiple attempts made to have pt consume food.    Comments/MD notified: Notified Jacalyn LefevreEunice Thomas, Np, implemented hypoglycemic protocol    Scot JunKaren E Khalia Gong

## 2016-10-24 NOTE — Progress Notes (Signed)
Subjective/Complaints: Reviewed urology notes. Has had Foley for urinary retention, tight foreskin difficult catheter placement. Mobility status has improved over the last week.                              Unable to obtain ROS due to aphasia   Objective: Vital Signs: Blood pressure 139/74, pulse 89, temperature 98.4 F (36.9 C), temperature source Oral, resp. rate 19, weight 74.4 kg (164 lb), SpO2 98 %. No results found. Results for orders placed or performed during the hospital encounter of 10/05/16 (from the past 72 hour(s))  Glucose, capillary     Status: Abnormal   Collection Time: 10/21/16 11:40 AM  Result Value Ref Range   Glucose-Capillary 177 (H) 65 - 99 mg/dL  Glucose, capillary     Status: Abnormal   Collection Time: 10/21/16  4:27 PM  Result Value Ref Range   Glucose-Capillary 59 (L) 65 - 99 mg/dL  Glucose, capillary     Status: None   Collection Time: 10/21/16  5:03 PM  Result Value Ref Range   Glucose-Capillary 83 65 - 99 mg/dL  Glucose, capillary     Status: Abnormal   Collection Time: 10/21/16  9:24 PM  Result Value Ref Range   Glucose-Capillary 335 (H) 65 - 99 mg/dL  Glucose, capillary     Status: Abnormal   Collection Time: 10/22/16  6:29 AM  Result Value Ref Range   Glucose-Capillary 187 (H) 65 - 99 mg/dL  Glucose, capillary     Status: Abnormal   Collection Time: 10/22/16 11:54 AM  Result Value Ref Range   Glucose-Capillary 197 (H) 65 - 99 mg/dL  Glucose, capillary     Status: Abnormal   Collection Time: 10/22/16  5:24 PM  Result Value Ref Range   Glucose-Capillary 105 (H) 65 - 99 mg/dL  Glucose, capillary     Status: Abnormal   Collection Time: 10/22/16  9:36 PM  Result Value Ref Range   Glucose-Capillary 198 (H) 65 - 99 mg/dL  Glucose, capillary     Status: Abnormal   Collection Time: 10/23/16  7:03 AM  Result Value Ref Range   Glucose-Capillary 119 (H) 65 - 99 mg/dL  CBC     Status: Abnormal   Collection Time: 10/23/16  8:17 AM  Result Value  Ref Range   WBC 11.0 (H) 4.0 - 10.5 K/uL   RBC 4.62 4.22 - 5.81 MIL/uL   Hemoglobin 12.7 (L) 13.0 - 17.0 g/dL   HCT 41.8 39.0 - 52.0 %   MCV 90.5 78.0 - 100.0 fL   MCH 27.5 26.0 - 34.0 pg   MCHC 30.4 30.0 - 36.0 g/dL   RDW 21.5 (H) 11.5 - 15.5 %   Platelets 102 (L) 150 - 400 K/uL    Comment: REPEATED TO VERIFY CONSISTENT WITH PREVIOUS RESULT   Basic metabolic panel     Status: Abnormal   Collection Time: 10/23/16  8:17 AM  Result Value Ref Range   Sodium 150 (H) 135 - 145 mmol/L   Potassium 3.0 (L) 3.5 - 5.1 mmol/L   Chloride 110 101 - 111 mmol/L   CO2 29 22 - 32 mmol/L   Glucose, Bld 155 (H) 65 - 99 mg/dL   BUN 21 (H) 6 - 20 mg/dL   Creatinine, Ser 1.36 (H) 0.61 - 1.24 mg/dL   Calcium 8.7 (L) 8.9 - 10.3 mg/dL   GFR calc non Af Amer 49 (L) >60  mL/min   GFR calc Af Amer 57 (L) >60 mL/min    Comment: (NOTE) The eGFR has been calculated using the CKD EPI equation. This calculation has not been validated in all clinical situations. eGFR's persistently <60 mL/min signify possible Chronic Kidney Disease.    Anion gap 11 5 - 15  Glucose, capillary     Status: Abnormal   Collection Time: 10/23/16 12:34 PM  Result Value Ref Range   Glucose-Capillary 186 (H) 65 - 99 mg/dL  Glucose, capillary     Status: None   Collection Time: 10/23/16  5:26 PM  Result Value Ref Range   Glucose-Capillary 86 65 - 99 mg/dL  Glucose, capillary     Status: Abnormal   Collection Time: 10/23/16 10:40 PM  Result Value Ref Range   Glucose-Capillary 137 (H) 65 - 99 mg/dL      General: NAD. Vital signs reviewed.  Psych: Unable to assess due to aphasia Heart: RRR. Lungs: CTA bilaterally. Abdomen: Positive bowel sounds, soft  Skin: Erythema on dorsum of left foot.    Neurologic: Alert Expressive language deficits, garbled speech Inconsistently following commands, moving b/l UE and LLE.  Follows some simple commands Unable to assess MMT and sensation due to language Musculoskeletal: No edema. No  tenderness.  Assessment/Plan: 1. 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 Physiatrist is providing close team supervision and 24 hour management of active medical problems listed below. Physiatrist and rehab team continue to assess barriers to discharge/monitor patient progress toward functional and medical goals. FIM: Function - Bathing Position: Shower Body parts bathed by patient: Right arm, Left arm, Chest, Abdomen Body parts bathed by helper: Front perineal area, Buttocks, Right upper leg, Left upper leg, Right lower leg, Left lower leg, Back Bathing not applicable: Right lower leg, Left lower leg Assist Level:  (Mod assist)  Function- Upper Body Dressing/Undressing What is the patient wearing?: Pull over shirt/dress Pull over shirt/dress - Perfomed by patient: Thread/unthread left sleeve, Put head through opening Pull over shirt/dress - Perfomed by helper: Thread/unthread right sleeve, Pull shirt over trunk Assist Level:  (Mod assist) Set up : To obtain clothing/put away Function - Lower Body Dressing/Undressing What is the patient wearing?: Pants, Maryln Manuel, Shoes Position: Wheelchair/chair at [employer] - Performed by helper: Thread/unthread right underwear leg, Thread/unthread left underwear leg, Pull underwear up/down Pants- Performed by patient: Thread/unthread left pants leg Pants- Performed by helper: Thread/unthread right pants leg, Pull pants up/down, Fasten/unfasten pants Non-skid slipper socks- Performed by patient: Don/doff left sock Non-skid slipper socks- Performed by helper: Don/doff right sock, Don/doff left sock Shoes - Performed by helper: Don/doff right shoe, Don/doff left shoe, Fasten right, Fasten left TED Hose - Performed by helper: Don/doff right TED hose, Don/doff left TED hose Assist for footwear:  Maximal assist Assist for lower body dressing:  (Total assist)  Function - Toileting Toileting activity did not occur: No continent bowel/bladder event Toileting steps completed by helper: Adjust clothing prior to toileting, Performs perineal hygiene, Adjust clothing after toileting Toileting Assistive Devices: Other (comment) (holding onto sink) Assist level: Two helpers  Function - Air cabin crew transfer activity did not occur: N/A Toilet transfer assistive device: Bedside commode Assist level to toilet: 2 helpers Assist level from toilet: 2 helpers Assist level to bedside commode (at bedside): 2 helpers Assist level from bedside commode (at bedside): 2 helpers  Function -  Chair/bed transfer Chair/bed transfer activity did not occur: Safety/medical concerns Chair/bed transfer method: Squat pivot Chair/bed transfer assist level: Maximal assist (Pt 25 - 49%/lift and lower) Chair/bed transfer assistive device: Armrests, Bedrails Mechanical lift: Stedy Chair/bed transfer details: Manual facilitation for weight shifting, Tactile cues for posture, Verbal cues for sequencing, Verbal cues for technique, Manual facilitation for placement  Function - Locomotion: Wheelchair Will patient use wheelchair at discharge?: Yes Type: Manual Wheelchair activity did not occur: Safety/medical concerns Max wheelchair distance: 30' Assist Level: Maximal assistance (Pt 25 - 49%) Wheel 50 feet with 2 turns activity did not occur: Safety/medical concerns Assist Level: Total assistance (Pt < 25%) Wheel 150 feet activity did not occur: Safety/medical concerns Turns around,maneuvers to table,bed, and toilet,negotiates 3% grade,maneuvers on rugs and over doorsills: No Function - Locomotion: Ambulation Ambulation activity did not occur: Safety/medical concerns Assistive device: Walker-rolling Max distance: 50 Assist level: Maximal assist (Pt 25 - 49%) Walk 10 feet activity did not occur:  Safety/medical concerns Assist level: Maximal assist (Pt 25 - 49%) Walk 50 feet with 2 turns activity did not occur:  (w/c follow for safety) Assist level: 2 helpers Walk 150 feet activity did not occur: Safety/medical concerns Walk 10 feet on uneven surfaces activity did not occur: Safety/medical concerns  Function - Comprehension Comprehension: Auditory Comprehension assist level: Understands basic 25 - 49% of the time/ requires cueing 50 - 75% of the time  Function - Expression Expression: Verbal Expression assist level: Expresses basic 25 - 49% of the time/requires cueing 50 - 75% of the time. Uses single words/gestures.  Function - Social Interaction Social Interaction assist level: Interacts appropriately 50 - 74% of the time - May be physically or verbally inappropriate.  Function - Problem Solving Problem solving assist level: Solves basic 50 - 74% of the time/requires cueing 25 - 49% of the time  Function - Memory Memory assist level: Recognizes or recalls less than 25% of the time/requires cueing greater than 75% of the time Patient normally able to recall (first 3 days only): That he or she is in a hospital   Medical Problem List and Plan: 1.  Right-sided weakness, aphasia, dysphagia secondary to left MCA infarct  Cont CIR, PT, OT, speech, reduced burden of care team conference in a.m. 2.  DVT Prophylaxis/Anticoagulation: Subcutaneous Lovenox, holding, plt ct recovering, stop, baby aspirin, given ongoing GI bleeding. Appreciate hematology and GI notes  Will need at least weekly CBC to monitor hemoglobin  SCDs ordered 3. Pain Management: Tylenol as needed 4. Dysphagia. Dysphagia #1 nectar liquids. Monitor for any signs of aspiration.   Added daily at bedtime IV fluids 5. Neuropsych: This patient is capable of making decisions on his own behalf. 6. Skin/Wound Care: Routine skin checks 7. Fluids/Electrolytes/Nutrition: Routine I&Os, IV fluid held, however. BUN/creatinine  are creeping up, will resume 8. Aspiration pneumonia. Augmentin completed. 9. Diabetes mellitus with peripheral neuropathy. Hemoglobin A1c 7.2.   Check blood sugars before meals and at bedtime  Currently Lantus insulin 16 units daily . A.m. CBGs are in desired range  Novolog 4U with meals added 12/25, increased to 8U on 12/26, increased to [place] on 12/27  CBG's higher in mid day to PM.--increased lantus to 20u q 0800 on 12/30, will increase to 25 units daily at bedtime-  current readings in acceptable range CBG (last 3)   Recent Labs  10/23/16 1234 10/23/16 1726 10/23/16 2240  GLUCAP 186* 86 137*    10. Hypertension/tachycardia. Lopressor 12.5 mg twice a day. Monitor  heart rate with increased mobility, systolic elevation will increase Lopressor   Vitals:   10/23/16 2143 10/24/16 0437  BP: (!) 152/78 139/74  Pulse: 98 89  Resp:  19  Temp:  98.4 F (36.9 C)   11. Stage III chronic kidney disease. Baseline creatinine 1.62. Follow-up chemistries, Currently at baseline  BMP Latest Ref Rng & Units 10/23/2016 10/21/2016 10/16/2016  Glucose 65 - 99 mg/dL 155(H) 94 132(H)  BUN 6 - 20 mg/dL 21(H) 23(H) 18  Creatinine 0.61 - 1.24 mg/dL 1.36(H) 1.34(H) 1.32(H)  Sodium 135 - 145 mmol/L 150(H) 150(H) 141  Potassium 3.5 - 5.1 mmol/L 3.0(L) 3.5 3.2(L)  Chloride 101 - 111 mmol/L 110 113(H) 108  CO2 22 - 32 mmol/L _0 Calcium 8.9 - 10.3 mg/dL 8.7(L) 8.2(L) 8.4(L)   12. Mood. Depakote 250 mg BID added for bouts of agitation and restlessness. 13. Hypothyroidism. Synthroid 14. Hyperlipidemia. Lipitor 15. Anemia, acute blood loss. Also with pancytopenia related to infection, now improving.     GI consulted, spoke to GI with plans for EGD, family refused, spoke with GI again, cont PPI  Heme/Onc consulted -slow GI bleed, Heme/Onc on consult,  , improvement in hemoglobin status post transfusion Continue to monitor. Check CBC in a.m.                    16.  Leukocytosis:    afebrile x 9d        voiding trial , difficult to place a Foley, may need urology if unable to tolerate. I/O cath   17. Thrombocytopenia: Negative. Hit panel Improve, off Lovenox  SRA negative  Will cont to monitor 18. Hypoalbuminemia  Supplement started 12/23 19. Acute lower UTI-resolved Voiding trial, if in and out catheterization is required and if this is difficult. May need to replace Foley. May require urology assistance for this    20. CKD  At baseline  Cont to monitor  HypoK , improved, continue to supplement  Will cont to monitor   -HS IVF,  BMP Latest Ref Rng & Units 10/23/2016 10/21/2016 10/16/2016  Glucose 65 - 99 mg/dL 155(H) 94 132(H)  BUN 6 - 20 mg/dL 21(H) 23(H) 18  Creatinine 0.61 - 1.24 mg/dL 1.36(H) 1.34(H) 1.32(H)  Sodium 135 - 145 mmol/L 150(H) 150(H) 141  Potassium 3.5 - 5.1 mmol/L 3.0(L) 3.5 3.2(L)  Chloride 101 - 111 mmol/L 110 113(H) 108  CO2 22 - 32 mmol/L _1 Calcium 8.9 - 10.3 mg/dL 8.7(L) 8.2(L) 8.4(L)   21.  Hypernatremia and Chloremia, recheck BMET in am Supplement KCL LOS (Days) [place] E 10/24/2016, 8:53 AM

## 2016-10-25 ENCOUNTER — Inpatient Hospital Stay (HOSPITAL_COMMUNITY): Payer: Medicare Other | Admitting: Occupational Therapy

## 2016-10-25 ENCOUNTER — Inpatient Hospital Stay (HOSPITAL_COMMUNITY): Payer: Medicare Other

## 2016-10-25 ENCOUNTER — Inpatient Hospital Stay (HOSPITAL_COMMUNITY): Payer: Medicare Other | Admitting: Speech Pathology

## 2016-10-25 LAB — GLUCOSE, CAPILLARY
GLUCOSE-CAPILLARY: 212 mg/dL — AB (ref 65–99)
GLUCOSE-CAPILLARY: 187 mg/dL — AB (ref 65–99)
Glucose-Capillary: 113 mg/dL — ABNORMAL HIGH (ref 65–99)
Glucose-Capillary: 224 mg/dL — ABNORMAL HIGH (ref 65–99)

## 2016-10-25 LAB — OCCULT BLOOD X 1 CARD TO LAB, STOOL
FECAL OCCULT BLD: NEGATIVE
Fecal Occult Bld: NEGATIVE

## 2016-10-25 MED ORDER — CITALOPRAM HYDROBROMIDE 10 MG PO TABS
10.0000 mg | ORAL_TABLET | Freq: Every day | ORAL | Status: DC
  Administered 2016-10-26 – 2016-10-31 (×6): 10 mg via ORAL
  Filled 2016-10-25 (×6): qty 1

## 2016-10-25 NOTE — Progress Notes (Signed)
Attempting to contact patient's niece or nephew at listed numbers without success.  Will attempt again later to contact family for consent for PICC placement.  Bedside nurse updated.  Gasper LloydKerry Margarite Vessel, RN VAST

## 2016-10-25 NOTE — Progress Notes (Signed)
Social Work [name], LCSW Social Worker Signed   Patient Care Conference Date of Service: 10/25/2016  1:40 PM      Hide copied text Hover for attribution information Inpatient RehabilitationTeam Conference and Plan of Care Update Date: 10/25/2016   Time: 11:00 AM      Patient Name: Douglas [name]      Medical Record Number: [id number]  Date of Birth: 16-Nov-1940 Sex: Male         Room/Bed: 4W08C/4W08C-01 Payor Info: Payor: MEDICARE / Plan: MEDICARE PART A AND B / Product Type: *No Product type* /     Admitting Diagnosis: CVA  Admit Date/Time:  10/05/2016  6:35 PM Admission Comments: No comment available    Primary Diagnosis:  <principal problem not specified> Principal Problem: <principal problem not specified>       Patient Active Problem List    Diagnosis Date Noted  . HCAP (healthcare-associated pneumonia)    . Sepsis (HCC)    . AKI (acute kidney injury) (HCC)    . Erythema    . Acute lower UTI    . Dysphagia due to recent cerebral infarction    . Hypoalbuminemia due to protein-calorie malnutrition (HCC)    . Thrombocytopenia (HCC)    . Leukocytosis    . Acute blood loss anemia    . Type 2 diabetes mellitus with peripheral neuropathy (HCC)    . Labile blood glucose    . 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 Right hemiparesis (HCC)    . Urinary retention    . Acute delirium    . CKD (chronic kidney disease) 09/27/2016  . Elevated troponin 09/27/2016  . Anemia 09/27/2016  . Acute embolic stroke (HCC)    . Acute systolic heart failure (HCC)    . Benign essential HTN    . Tachycardia    . CVA (cerebral vascular accident) (HCC) 09/26/2016  . Hyperlipidemia 11/30/2013  . Essential hypertension 11/30/2013  . DM (diabetes mellitus) (HCC) 11/30/2013      Expected Discharge Date: Expected Discharge Date: 11/03/16   Team Members Present: Physician leading conference: Dr. 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 LBM 10/23/16. Foley d/t urinary retention and tight foreskin.  Be free of CAUTI and manage bowel with min assist.  Offer frequent toileting and perform peri and cath Q shift and PRN.   Swallow/Nutrition/ Hydration   Dys 1 , honey thick liquids via teaspoons   min assist   trials of nectar thick liquids    ADL's   mod-max assist transfers and bathing, mod assist sit > stand, impaired motor planning and sequencing, max cues for ADL tasks due to ideational apraxia  mod assist overall  ADL retraining, balance, retraining, midline orientation, Rt attention, NMR, sequencing, motor planning   Mobility   mod/max overall  upgraded to min/mod overall   cognition, awareness, attention, initiation, NMR, postural control, midline orientation, transfers, ambulation,  w/c propulsion    Communication   slow progress, max to total assist  mod-max assist; downgraded   continue to address functional communication via any modality    Safety/Cognition/ Behavioral Observations still remains limited by endurance, max to total assist   max assist; downgraded   continue to address attention and arousal   Pain   Denies any pain  Remain pain free  Assess and monitor pain Q shift and PRN.   Skin   Scattered bruising, MASD to bottom  Be free of skin breakdown  Asssess and monitor skin integrity Q Shift and PRN.       *See Care Plan and progress notes for  long and short-term goals.   Barriers to Discharge: Uncontrolled diabetes, heavy physical assistance   Possible Resolutions to Barriers:  Continue diabetic management, coordinate with nursing, will likely need SNF   Discharge Planning/Teaching Needs:  Begin looking for NH bed, niece and nephew aware and are concerned about his medical issues and hemoglobin.      Team Discussion:  Slow progress in therapies, poor endurance and arousal. Seems to be withdrawing in therapies-ques depression. Hemoglobin stable off antibiotics. Diet-Dys 1-honey thick will IV at [facility]. Team to focus on functional tasks in therapies. Foley to stay in after talking to [facility]-difficult to get it to begin with-no voiding trial while here. MD feels medially ready for next venue of care-NH. Will go with mid-line.  Revisions to Treatment Plan:  Ready for transfer to next venue of care    Continued Need for Acute Rehabilitation Level of Care: The patient requires daily medical management by a physician with specialized training in physical medicine and rehabilitation for the following conditions: Daily direction of a multidisciplinary physical rehabilitation program to ensure safe treatment while eliciting the highest outcome that is of practical value to the patient.: Yes Daily medical management of patient stability for increased activity during participation in an intensive rehabilitation regime.: Yes Daily analysis of laboratory values and/or radiology reports with any subsequent need for medication adjustment of medical intervention for : Neurological problems   Jaqualyn Juday, Lemar Livings 10/25/2016, 1:41 PM      [name], LCSW Social Worker Signed   Patient Care Conference Date of Service: 10/18/2016  1:16 PM      Hide copied text Hover for attribution information Inpatient RehabilitationTeam Conference and Plan of Care Update Date: 10/18/2016   Time: 11:00 AM      Patient Name: Douglas [name]      Medical Record  Number: [id number]  Date of Birth: Nov 07, 1940 Sex: Male         Room/Bed: 4W08C/4W08C-01 Payor Info: Payor: MEDICARE / Plan: MEDICARE PART A AND B / Product Type: *No Product type* /     Admitting Diagnosis: CVA  Admit Date/Time:  10/05/2016  6:35 PM Admission Comments: No comment available    Primary Diagnosis:  <principal problem not specified> Principal Problem: <principal problem not specified>       Patient Active Problem List    Diagnosis Date Noted  . HCAP (healthcare-associated pneumonia)    . Sepsis (HCC)    . AKI (acute kidney injury) (HCC)    . Erythema    . Acute lower UTI    . Dysphagia due to recent cerebral infarction    . Hypoalbuminemia due to protein-calorie malnutrition (HCC)    . Thrombocytopenia (HCC)    . Leukocytosis    . Acute blood loss anemia    . Type 2 diabetes  mellitus with peripheral neuropathy (HCC)    . Labile blood glucose    . 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 Right hemiparesis (HCC)    . Urinary retention    . Acute delirium    . CKD (chronic kidney disease) 09/27/2016  . Elevated troponin 09/27/2016  . Anemia 09/27/2016  . Acute embolic stroke (HCC)    . Acute systolic heart failure (HCC)    . Benign essential HTN    . Tachycardia    . CVA (cerebral vascular accident) (HCC) 09/26/2016  . Hyperlipidemia 11/30/2013  . Essential hypertension 11/30/2013  . DM (diabetes mellitus) (HCC) 11/30/2013      Expected Discharge Date: Expected Discharge Date: 11/03/16   Team Members Present: Physician leading conference: Dr. Claudette LawsAndrew Kirsteins Social Worker Present: Dossie DerBecky Naba Sneed, LCSW Nurse Present: Carmie EndAngie Joyce, RN PT Present: Teodoro Kilaitlin Penven-Crew, PT OT Present: Rosalio LoudSarah Hoxie, OT SLP Present: Jackalyn LombardNicole Page, SLP PPS Coordinator present : Tora DuckMarie Noel, RN, CRRN       Current Status/Progress Goal Weekly Team Focus  Medical   Anemia, UTI resolved, thrombocytopenia resolved, still with severe aphasia, right-sided hemiparesis, poor  intake  Maintain medical stability, monitor hemoglobin,  Monitor for further blood loss, establish need for repeat transfusion   Bowel/Bladder   Bowel incontinence---LBM 10/17/2016; Foley catheter for bladder  Pt will manage bowel with min assist; Pt will manage bladder with foley catheter with total assist  Monitor for changes in bowel continence and toilet; maintain and assess for need of foley catheter   Swallow/Nutrition/ Hydration   Downgraded to Dys 1, honey thick liquids per MBS   min assist   toleration of diet   ADL's   min assist with max demonstrational cueing for bathing/dressing, max to +2 for LB bathing/dressing, cues to incorporate use of RUE into functional tasks  min to mod assist   ADL retraining, balance retraining, midline orientation, Rt attention, NMR   Mobility   max assist overall  overall mod assist  cognition, awareness, attention, NMR, postural control, midline orientation, transfers, ambulation, w/c propulsion   Communication   max to total assist for functional communication   min-mod assist   continue to address automatic sequqnces, yes/no response accuracy, multimodal communication    Safety/Cognition/ Behavioral Observations decreased focused and sustained attention to tasks, continues to be limited by poor endurance, max to total assist   min assist   attention, arousal    Pain   Pt does not complain of any pain; Faces=0  Pt will maintain current pain status  Monitor for signs of pain; Use Faces scale   Skin   Scattered bruising to body; dry skin; MASD to anus  Pt will be free of skin breakdown  Monitor for changes in skin integrity; manage MASD with barrier cream       *See Care Plan and progress notes for long and short-term goals.   Barriers to Discharge: The factorial anemia, and does have positive occult blood in stool. Has required transfusion last week.   Possible Resolutions to Barriers:  Hematology oncology consult, consider internal medicine consult     Discharge Planning/Teaching Needs:  Plan for pt to go to a NH once medically stable and able to be one person care. Niece and Nephew and roommate visit when they can-all work      Team Discussion:  Making good progress in PT-ambulated 100 ft with mod assist level. Doing well in OT also still max assist level. Diet downgraded to honey thick  after MBS last week. IV's in pm-MD to see if can discharge and see if can hydrate himself. Hemoglobin dropping again-have GI come back and may need transfusion. Incontinent of bowel and still has foley. MD reports medical issues not ready to have worker look at [facility] yet.   Revisions to Treatment Plan:  Downgraded diet to honey thick liquids after MBS    Continued Need for Acute Rehabilitation Level of Care: The patient requires daily medical management by a physician with specialized training in physical medicine and rehabilitation for the following conditions: Daily direction of a multidisciplinary physical rehabilitation program to ensure safe treatment while eliciting the highest outcome that is of practical value to the patient.: Yes Daily medical management of patient stability for increased activity during participation in an intensive rehabilitation regime.: Yes Daily analysis of laboratory values and/or radiology reports with any subsequent need for medication adjustment of medical intervention for : Neurological problems;Other;Nutritional problems   [name] 10/18/2016, 3:30 PM      [name], LCSW Social Worker Signed   Patient Care Conference Date of Service: 10/11/2016  1:32 PM      Hide copied text Hover for attribution information Inpatient RehabilitationTeam Conference and Plan of Care Update Date: 10/11/2016   Time: 10:00 AM      Patient Name: Douglas [name]      Medical Record Number: [id number]  Date of Birth: 05/17/1941 Sex: Male         Room/Bed: 4W08C/4W08C-01 Payor Info: Payor: MEDICARE / Plan: MEDICARE PART A  AND B / Product Type: *No Product type* /     Admitting Diagnosis: CVA  Admit Date/Time:  10/05/2016  6:35 PM Admission Comments: No comment available    Primary Diagnosis:  <principal problem not specified> Principal Problem: <principal problem not specified>       Patient Active Problem List    Diagnosis Date Noted  . Erythema    . Acute lower UTI    . Dysphagia due to recent cerebral infarction    . Hypoalbuminemia due to protein-calorie malnutrition (HCC)    . Thrombocytopenia (HCC)    . Leukocytosis    . Acute blood loss anemia    . Type 2 diabetes mellitus with peripheral neuropathy (HCC)    . Labile blood glucose    . 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 Right hemiparesis (HCC)    . Urinary retention    . Acute delirium    . CKD (chronic kidney disease) 09/27/2016  . Elevated troponin 09/27/2016  . Anemia 09/27/2016  . Acute embolic stroke (HCC)    . Acute systolic heart failure (HCC)    . Benign essential HTN    . Tachycardia    . CVA (cerebral vascular accident) (HCC) 09/26/2016  . Hyperlipidemia 11/30/2013  . Essential hypertension 11/30/2013  . DM (diabetes mellitus) (HCC) 11/30/2013      Expected Discharge Date: Expected Discharge Date: 11/03/16   Team Members Present: Physician leading conference: Dr. Maryla Morrow Social Worker Present: Dossie Der, LCSW Nurse Present: Chana Bode, RN PT Present: Teodoro Kil, PT OT Present: Roney Mans, OT SLP Present: Jackalyn Lombard, SLP PPS Coordinator present : Tora Duck, RN, CRRN       Current Status/Progress Goal Weekly Team Focus  Medical   Right-sided weakness, aphasia, dysphagia secondary to left MCA infarct  Decrease burden of care, improve mobility, cognition, severe ABLA, thrombocytopenia, leukocytosis  See above   Bowel/Bladder   incont of bowel, managed  bladder with foley  manage bowel with min assist and total assist of bladder wtih foley  moniitor need for /use laxatives for  bowel movement, using briefs for management   Swallow/Nutrition/ Hydration   Dys 1, nectar thick liquids; full supervision   min assist   MBS, toleration of current diet, trials of advanced consistencies to continue working towards diet progression   ADL's     total assist of 2   mod/max assist   awareness, transfers, standing, ADL's  Mobility   total assist>+2 assist  overall mod assist  cognition, awareness, attention, NMR, postural control, transfers, standing   Communication   globally aphasic, max to total assist for functional communication, also limited by lethargy   min-mod assist   automatic sequences, yes/no response accuracy, multimodal communication    Safety/Cognition/ Behavioral Observations limited due to lethargy, max to total assist for basic tasks  min assist   attention, arousal   Pain   n/a         Skin   bruising scattered over body, dry flaky pale skin  bruising improving, no skin integrity breakdown  turn q 2 and monitor skin each shift       *See Care Plan and progress notes for long and short-term goals.   Barriers to Discharge: ABLA, thrombocytopenia, leukocytosis, UTI, global aphasia, DM   Possible Resolutions to Barriers:  GI consult, follow labs, may need Heme/Onc consult, therapies, decrease burden of care, optimize DM meds   Discharge Planning/Teaching Needs:  Pt does not have 24 hr care at home, will need to go to a NH when medically ready for DC from CIR. Niece and nephew are supportive along with roommate      Team Discussion:  Goals max-total of one person, currently plus two total assist. Pushes to the right. Globally aphasic. MBS tomorrow to see if can advance diet. GI consulted due to low hemoglobin-blood in stools. Limited by fatigue-needs multiple rest breaks. Will be NHP when medically ready to transfer from here, no caregivers.  Revisions to Treatment Plan:  None    Continued Need for Acute Rehabilitation Level of Care: The patient requires  daily medical management by a physician with specialized training in physical medicine and rehabilitation for the following conditions: Daily direction of a multidisciplinary physical rehabilitation program to ensure safe treatment while eliciting the highest outcome that is of practical value to the patient.: Yes Daily medical management of patient stability for increased activity during participation in an intensive rehabilitation regime.: Yes Daily analysis of laboratory values and/or radiology reports with any subsequent need for medication adjustment of medical intervention for : Neurological problems;Diabetes problems;Other;Nutritional problems   [name] 10/11/2016, 2:33 PM       Patient ID: Osborn Coho, male   DOB: Feb 19, 1941, 76 y.o.   MRN: [id number]

## 2016-10-25 NOTE — Progress Notes (Signed)
Spoke with niece Douglas [name] by phone regarding PICC placement.  Ms. Douglas [name] states that her brother is the POA and will need to make the final decision.  Information given regarding PICC placement, use, risk and benefits to Ms. Douglas [name] and stated that I would call brother later when he is available after work.  Ms. Douglas [name] with many questions regarding Douglas [name]'s care and concerns for infection risk.  She is wanting to know if there are other options for Douglas [name] to get what he needs.  She is planning to request a conference with the MD to get further information to make a decision with her brother regarding PICC placement.  Douglas MarchJeanna, Douglas [name] updated and aware of the niece's concerns.  Douglas LloydKerry Sheria Rosello, Douglas [name] VAST

## 2016-10-25 NOTE — Progress Notes (Signed)
Physical Therapy Session Note  Patient Details  Name: Douglas [name] MRN: [id number] Date of Birth: 05/25/1941  Today's Date: 10/25/2016 PT Individual Time: 0900-1000 PT Individual Time Calculation (min): 60 min   Short Term Goals:  Week 3:  PT Short Term Goal 1 (Week 3): Pt will ambulate x100' with LRAD and mod assist.  PT Short Term Goal 2 (Week 3): Pt will self propel w/c 25' with min assist.  PT Short Term Goal 3 (Week 3): Pt will maintain static standing balance for 3 minutes during a functional task with min assist   Skilled Therapeutic Interventions/Progress Updates: sqaut pivot from w/c to L using L hand to pull on armrest of NuStep at level 4 x 5 minutes, for neuromuscular re-education via forced use for alternating reciprocal movement x bil LEs and LUE; unable to keep R hand in splint. NuStep > w/c to L required +2 for pt to safely get bottom onto cushion.  Gait with grocery cart +2 x 120' x 2.  Pt required 3 attempts to stand, but when he initiated eventually, min/mod assist. Pushing tendencies increased with fatigue. Therapeutic activity in standing at kitchen counter, +2 for standing balance due to pushing, and hand over hand assistance to use R or L hand to move boxes of Jello.  Pt tolerated standing x 30 seconds at best. Therapeutic activities in sitting: catching and releasing a beach ball with bil hands; kicking beach ball with R foot.Pt left resting in w/c with quick release belt applied at nurse's station for supervision.       Therapy Documentation Precautions:  Precautions Precautions: Fall Precaution Comments: R side inattention, strong push to R side, posterior lean Restrictions Weight Bearing Restrictions: No  Pain: Pain Assessment Pain Assessment: No/denies pain   Other Treatments: Treatments Neuromuscular Facilitation: Right;Upper Extremity;Lower Extremity;Activity to increase motor control;Activity to increase timing and sequencing;Activity to increase  anterior-posterior weight shifting;Activity to increase lateral weight shifting   See Function Navigator for Current Functional Status.   Therapy/Group: Individual Therapy  Douglas [name] 10/25/2016, 12:14 PM

## 2016-10-25 NOTE — Progress Notes (Signed)
Spoke with patient's nephew Andre LefortWallace Ziebell regarding PICC.  After explaining insertion, use, benefit and risk Mr. Worthy stated that he and his sister are planning a meeting with MD tomorrow to discuss plan of care.  Mr. Oneita Jollyydelette states that once this meeting occurs a decision to place PICC will be made.   Thank you,  Gasper LloydKerry Lydia Meng, RN VAST

## 2016-10-25 NOTE — Progress Notes (Signed)
Subjective/Complaints: Contacted urology, recommend continue Foley and follow-up 1 month in urology clinic Low CBG discussed with RN, patient remains severely aphasic                             Unable to obtain ROS due to aphasia   Objective: Vital Signs: Blood pressure (!) 162/71, pulse 80, temperature 98.2 F (36.8 C), temperature source Oral, resp. rate 18, weight 71.5 kg (157 lb 9.6 oz), SpO2 98 %. No results found. Results for orders placed or performed during the hospital encounter of 10/05/16 (from the past 72 hour(s))  Glucose, capillary     Status: Abnormal   Collection Time: 10/22/16 11:54 AM  Result Value Ref Range   Glucose-Capillary 197 (H) 65 - 99 mg/dL  Glucose, capillary     Status: Abnormal   Collection Time: 10/22/16  5:24 PM  Result Value Ref Range   Glucose-Capillary 105 (H) 65 - 99 mg/dL  Glucose, capillary     Status: Abnormal   Collection Time: 10/22/16  9:36 PM  Result Value Ref Range   Glucose-Capillary 198 (H) 65 - 99 mg/dL  Glucose, capillary     Status: Abnormal   Collection Time: 10/23/16  7:03 AM  Result Value Ref Range   Glucose-Capillary 119 (H) 65 - 99 mg/dL  CBC     Status: Abnormal   Collection Time: 10/23/16  8:17 AM  Result Value Ref Range   WBC 11.0 (H) 4.0 - 10.5 K/uL   RBC 4.62 4.22 - 5.81 MIL/uL   Hemoglobin 12.7 (L) 13.0 - 17.0 g/dL   HCT 41.8 39.0 - 52.0 %   MCV 90.5 78.0 - 100.0 fL   MCH 27.5 26.0 - 34.0 pg   MCHC 30.4 30.0 - 36.0 g/dL   RDW 21.5 (H) 11.5 - 15.5 %   Platelets 102 (L) 150 - 400 K/uL    Comment: REPEATED TO VERIFY CONSISTENT WITH PREVIOUS RESULT   Basic metabolic panel     Status: Abnormal   Collection Time: 10/23/16  8:17 AM  Result Value Ref Range   Sodium 150 (H) 135 - 145 mmol/L   Potassium 3.0 (L) 3.5 - 5.1 mmol/L   Chloride 110 101 - 111 mmol/L   CO2 29 22 - 32 mmol/L   Glucose, Bld 155 (H) 65 - 99 mg/dL   BUN 21 (H) 6 - 20 mg/dL   Creatinine, Ser 1.36 (H) 0.61 - 1.24 mg/dL   Calcium 8.7 (L) 8.9  - 10.3 mg/dL   GFR calc non Af Amer 49 (L) >60 mL/min   GFR calc Af Amer 57 (L) >60 mL/min    Comment: (NOTE) The eGFR has been calculated using the CKD EPI equation. This calculation has not been validated in all clinical situations. eGFR's persistently <60 mL/min signify possible Chronic Kidney Disease.    Anion gap 11 5 - 15  Glucose, capillary     Status: Abnormal   Collection Time: 10/23/16 12:34 PM  Result Value Ref Range   Glucose-Capillary 186 (H) 65 - 99 mg/dL  Glucose, capillary     Status: None   Collection Time: 10/23/16  5:26 PM  Result Value Ref Range   Glucose-Capillary 86 65 - 99 mg/dL  Glucose, capillary     Status: Abnormal   Collection Time: 10/23/16  8:50 PM  Result Value Ref Range   Glucose-Capillary 61 (L) 65 - 99 mg/dL  Glucose, capillary  Status: None   Collection Time: 10/23/16  9:11 PM  Result Value Ref Range   Glucose-Capillary 76 65 - 99 mg/dL  Glucose, capillary     Status: Abnormal   Collection Time: 10/23/16 10:40 PM  Result Value Ref Range   Glucose-Capillary 137 (H) 65 - 99 mg/dL  Glucose, capillary     Status: Abnormal   Collection Time: 10/24/16  6:38 AM  Result Value Ref Range   Glucose-Capillary 111 (H) 65 - 99 mg/dL  Glucose, capillary     Status: Abnormal   Collection Time: 10/24/16 11:26 AM  Result Value Ref Range   Glucose-Capillary 234 (H) 65 - 99 mg/dL  Glucose, capillary     Status: Abnormal   Collection Time: 10/24/16  5:13 PM  Result Value Ref Range   Glucose-Capillary 29 (LL) 65 - 99 mg/dL   Comment 1 Notify RN   Glucose, capillary     Status: Abnormal   Collection Time: 10/24/16  5:28 PM  Result Value Ref Range   Glucose-Capillary 30 (LL) 65 - 99 mg/dL   Comment 1 Document in Chart   Glucose, capillary     Status: Abnormal   Collection Time: 10/24/16  6:00 PM  Result Value Ref Range   Glucose-Capillary 46 (L) 65 - 99 mg/dL  Glucose, capillary     Status: Abnormal   Collection Time: 10/24/16  6:27 PM  Result Value  Ref Range   Glucose-Capillary 199 (H) 65 - 99 mg/dL  Glucose, capillary     Status: Abnormal   Collection Time: 10/24/16  6:57 PM  Result Value Ref Range   Glucose-Capillary 191 (H) 65 - 99 mg/dL  Glucose, capillary     Status: Abnormal   Collection Time: 10/24/16  7:49 PM  Result Value Ref Range   Glucose-Capillary 190 (H) 65 - 99 mg/dL  Glucose, capillary     Status: Abnormal   Collection Time: 10/24/16  8:58 PM  Result Value Ref Range   Glucose-Capillary 167 (H) 65 - 99 mg/dL  Glucose, capillary     Status: Abnormal   Collection Time: 10/25/16  6:53 AM  Result Value Ref Range   Glucose-Capillary 113 (H) 65 - 99 mg/dL      General: NAD. Vital signs reviewed.  Psych: Unable to assess due to aphasia Heart: RRR. Lungs: CTA bilaterally. Abdomen: Positive bowel sounds, soft  Skin: Erythema on dorsum of left foot.    Neurologic: Alert Expressive language deficits, garbled speech Inconsistently following commands, moving b/l UE and LLE.  Follows some simple commands Unable to assess MMT and sensation due to language Musculoskeletal: No edema. No tenderness.  Assessment/Plan: 1. 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 Physiatrist is providing close team supervision and 24 hour management of active medical problems listed below. Physiatrist and rehab team continue to assess barriers to discharge/monitor patient progress toward functional and medical goals. FIM: Function - Bathing Position: Wheelchair/chair at sink Body parts bathed by patient: Right arm, Left arm, Chest, Abdomen, Left upper leg Body parts bathed by helper: Front perineal area, Buttocks, Right upper leg, Right lower leg, Left lower leg, Back Bathing not applicable: Right lower leg, Left lower leg Assist Level:  (Mod assist)  Function- Upper Body  Dressing/Undressing What is the patient wearing?: Pull over shirt/dress Pull over shirt/dress - Perfomed by patient: Put head through opening, Thread/unthread left sleeve Pull  over shirt/dress - Perfomed by helper: Thread/unthread right sleeve, Pull shirt over trunk Assist Level:  (Mod assist) Set up : To obtain clothing/put away Function - Lower Body Dressing/Undressing What is the patient wearing?: Pants, Non-skid slipper socks Position: Wheelchair/chair at sink Underwear - Performed by helper: Thread/unthread right underwear leg, Thread/unthread left underwear leg, Pull underwear up/down Pants- Performed by patient: Thread/unthread left pants leg Pants- Performed by helper: Thread/unthread left pants leg, Pull pants up/down, Thread/unthread right pants leg Non-skid slipper socks- Performed by patient: Don/doff left sock Non-skid slipper socks- Performed by helper: Don/doff right sock, Don/doff left sock Shoes - Performed by helper: Don/doff right shoe, Don/doff left shoe, Fasten right, Fasten left TED Hose - Performed by helper: Don/doff right TED hose, Don/doff left TED hose Assist for footwear: Maximal assist Assist for lower body dressing:  (Total assist)  Function - Toileting Toileting activity did not occur: No continent bowel/bladder event Toileting steps completed by helper: Adjust clothing prior to toileting, Performs perineal hygiene, Adjust clothing after toileting Toileting Assistive Devices: Other (comment) (holding onto sink) Assist level: Two helpers  Function - Air cabin crew transfer activity did not occur: N/A Toilet transfer assistive device: Bedside commode Assist level to toilet: 2 helpers Assist level from toilet: 2 helpers Assist level to bedside commode (at bedside): 2 helpers Assist level from bedside commode (at bedside): 2 helpers  Function - Chair/bed transfer Chair/bed transfer activity did not occur: Safety/medical concerns Chair/bed  transfer method: Squat pivot Chair/bed transfer assist level: Maximal assist (Pt 25 - 49%/lift and lower) Chair/bed transfer assistive device: Armrests, Bedrails Mechanical lift: Stedy Chair/bed transfer details: Manual facilitation for weight shifting, Tactile cues for posture, Verbal cues for sequencing, Verbal cues for technique, Manual facilitation for placement  Function - Locomotion: Wheelchair Will patient use wheelchair at discharge?: Yes Type: Manual Wheelchair activity did not occur: Safety/medical concerns Max wheelchair distance: 40 Assist Level: Maximal assistance (Pt 25 - 49%) Wheel 50 feet with 2 turns activity did not occur: Safety/medical concerns Assist Level: Total assistance (Pt < 25%) Wheel 150 feet activity did not occur: Safety/medical concerns Turns around,maneuvers to table,bed, and toilet,negotiates 3% grade,maneuvers on rugs and over doorsills: No Function - Locomotion: Ambulation Ambulation activity did not occur: Safety/medical concerns Assistive device: Walker-rolling Max distance: 50 Assist level: Maximal assist (Pt 25 - 49%) Walk 10 feet activity did not occur: Safety/medical concerns Assist level: Maximal assist (Pt 25 - 49%) Walk 50 feet with 2 turns activity did not occur:  (w/c follow for safety) Assist level: 2 helpers Walk 150 feet activity did not occur: Safety/medical concerns Walk 10 feet on uneven surfaces activity did not occur: Safety/medical concerns  Function - Comprehension Comprehension: Auditory Comprehension assist level: Understands basic 25 - 49% of the time/ requires cueing 50 - 75% of the time  Function - Expression Expression: Nonverbal Expression assistive device: Other (Comment) (gestures) Expression assist level: Expresses basic 25 - 49% of the time/requires cueing 50 - 75% of the time. Uses single words/gestures.  Function - Social Interaction Social Interaction assist level: Interacts appropriately 25 - 49% of time -  Needs frequent redirection.  Function - Problem Solving Problem solving assist level: Solves basic 50 - 74% of the time/requires cueing 25 - 49% of the time  Function - Memory Memory assist level: Recognizes or recalls less than 25% of the time/requires cueing greater than 75% of the time Patient normally able to recall (first 3 days only): That he or she is in a hospital  Medical Problem List and Plan: 1.  Right-sided weakness, aphasia, dysphagia secondary to left MCA infarct  Cont CIR, PT, OT, speech, Team conference today please see physician documentation under team conference tab, met with team face-to-face to discuss problems,progress, and goals. Formulized individual treatment plan based on medical history, underlying problem and comorbidities. 2.  DVT Prophylaxis/Anticoagulation: Subcutaneous Lovenox, holding, plt ct recovering, stop, baby aspirin, given ongoing GI bleeding. Appreciate hematology and GI notes  Will need at least weekly CBC to monitor hemoglobin  SCDs ordered 3. Pain Management: Tylenol as needed 4. Dysphagia. Dysphagia #1 nectar liquids. Monitor for any signs of aspiration.   Added daily at bedtime IV fluids 5. Neuropsych: This patient is capable of making decisions on his own behalf. 6. Skin/Wound Care: Routine skin checks 7. Fluids/Electrolytes/Nutrition: Routine I&Os, IV fluid qhs    8. Aspiration pneumonia. resolved 9. Diabetes mellitus with peripheral neuropathy. Hemoglobin A1c 7.2.   Check blood sugars before meals and at bedtime  Currently Lantus insulin 16 units daily . A.m. CBGs are in desired range  Novolog 4U with meals added 12/25, increased to 8U on 12/26, increased to [place] on 12/27  CBG's higher in mid day to PM.--increased lantus to 20u q 0800 on 12/30, will increase to 25 units daily at bedtime-  current readings in acceptable range CBG (last 3)   Recent Labs  10/24/16 1949 10/24/16 2058 10/25/16 0653  GLUCAP 190* 167* 113*    10.  Hypertension/tachycardia. Lopressor 12.5 mg twice a day. Monitor heart rate with increased mobility, systolic elevation will increase Lopressor   Vitals:   10/24/16 1500 10/25/16 0509  BP: (!) 170/74 (!) 162/71  Pulse: 87 80  Resp: 18 18  Temp: 98 F (36.7 C) 98.2 F (36.8 C)   11. Stage III chronic kidney disease. Baseline creatinine 1.62. Follow-up chemistries, Currently at baseline  BMP Latest Ref Rng & Units 10/23/2016 10/21/2016 10/16/2016  Glucose 65 - 99 mg/dL 155(H) 94 132(H)  BUN 6 - 20 mg/dL 21(H) 23(H) 18  Creatinine 0.61 - 1.24 mg/dL 1.36(H) 1.34(H) 1.32(H)  Sodium 135 - 145 mmol/L 150(H) 150(H) 141  Potassium 3.5 - 5.1 mmol/L 3.0(L) 3.5 3.2(L)  Chloride 101 - 111 mmol/L 110 113(H) 108  CO2 22 - 32 mmol/L '29 23 24  '$ Calcium 8.9 - 10.3 mg/dL 8.7(L) 8.2(L) 8.4(L)   12. Mood. Depakote 250 mg BID added for bouts of agitation and restlessness. 13. Hypothyroidism. Synthroid 14. Hyperlipidemia. Lipitor 15. Anemia, acute blood loss. Also with pancytopenia related to infection, now improving.     GI consulted, spoke to GI with plans for EGD, family refused, spoke with GI again, cont PPI  Heme/Onc consulted -slow GI bleed, Heme/Onc on consult,  , improvement in hemoglobin status post transfusion Continue to monitor. Check H and H in a.m.                    16.     17. Thrombocytopenia: Negative. Hit panel Improve, off Lovenox  SRA negative  Will cont to monitor, repeat plt in am 18. Hypoalbuminemia  Supplement started 12/23 19. Acute lower UTI-resolved No voiding trial per uro, f/u as outpt    20. CKD  At baseline  Cont to monitor  HypoK , increase supplementation  Will cont to monitor   -HS IVF,  BMP Latest Ref Rng & Units 10/23/2016 10/21/2016 10/16/2016  Glucose 65 - 99 mg/dL 155(H) 94 132(H)  BUN 6 - 20 mg/dL 21(H) 23(H) 18  Creatinine 0.61 -  1.24 mg/dL 1.36(H) 1.34(H) 1.32(H)  Sodium 135 - 145 mmol/L 150(H) 150(H) 141  Potassium 3.5 - 5.1 mmol/L 3.0(L) 3.5 3.2(L)   Chloride 101 - 111 mmol/L 110 113(H) 108  CO2 22 - 32 mmol/L '29 23 24  '$ Calcium 8.9 - 10.3 mg/dL 8.7(L) 8.2(L) 8.4(L)   21.  Hypernatremia and Chloremia, recheck BMET 1/11 Supplement KCL LOS (Days) 20 A FACE TO FACE EVALUATION WAS PERFORMED  Cayden Granholm E 10/25/2016, 9:16 AM

## 2016-10-25 NOTE — Plan of Care (Signed)
Problem: RH BOWEL ELIMINATION Goal: RH STG MANAGE BOWEL WITH ASSISTANCE STG Manage Bowel with max Assistance.  Outcome: Not Progressing Total assist     

## 2016-10-25 NOTE — Patient Care Conference (Signed)
Inpatient RehabilitationTeam Conference and Plan of Care Update Date: 10/25/2016   Time: 11:00 AM    Patient Name: Douglas [name]      Medical Record Number: [id number]  Date of Birth: 11/14/1940 Sex: Male         Room/Bed: 4W08C/4W08C-01 Payor Info: Payor: MEDICARE / Plan: MEDICARE PART A AND B / Product Type: *No Product type* /    Admitting Diagnosis: CVA  Admit Date/Time:  10/05/2016  6:35 PM Admission Comments: No comment available   Primary Diagnosis:  <principal problem not specified> Principal Problem: <principal problem not specified>  Patient Active Problem List   Diagnosis Date Noted  . HCAP (healthcare-associated pneumonia)   . Sepsis (HCC)   . AKI (acute kidney injury) (HCC)   . Erythema   . Acute lower UTI   . Dysphagia due to recent cerebral infarction   . Hypoalbuminemia due to protein-calorie malnutrition (HCC)   . Thrombocytopenia (HCC)   . Leukocytosis   . Acute blood loss anemia   . Type 2 diabetes mellitus with peripheral neuropathy (HCC)   . Labile blood glucose   . 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 Right hemiparesis (HCC)   . Urinary retention   . Acute delirium   . CKD (chronic kidney disease) 09/27/2016  . Elevated troponin 09/27/2016  . Anemia 09/27/2016  . Acute embolic stroke (HCC)   . Acute systolic heart failure (HCC)   . Benign essential HTN   . Tachycardia   . CVA (cerebral vascular accident) (HCC) 09/26/2016  . Hyperlipidemia 11/30/2013  . Essential hypertension 11/30/2013  . DM (diabetes mellitus) (HCC) 11/30/2013    Expected Discharge Date: Expected Discharge Date: 11/03/16  Team Members Present: Physician leading conference: Dr. Claudette LawsAndrew Kirsteins Social Worker Present: Dossie DerBecky Allenmichael Mcpartlin, LCSW Nurse Present: Carmie EndAngie Joyce, RN PT Present: Teodoro Kilaitlin Penven-Crew, PT OT Present: Rosalio LoudSarah Hoxie, OT SLP Present: Jackalyn LombardNicole Page, SLP PPS Coordinator present : Tora DuckMarie Noel, RN, CRRN     Current Status/Progress Goal Weekly Team  Focus  Medical   Continues to have severe aphasia, right hemiparesis, remains on IV fluids at night for poor intake. 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 LBM 10/23/16. Foley d/t urinary retention and tight foreskin.  Be free of CAUTI and manage bowel with min assist.  Offer frequent toileting and perform peri and cath Q shift and PRN.   Swallow/Nutrition/ Hydration   Dys 1 , honey thick liquids via teaspoons   min assist   trials of nectar thick liquids    ADL's   mod-max assist transfers and bathing, mod assist sit > stand, impaired motor planning and sequencing, max cues for ADL tasks due to ideational apraxia  mod assist overall  ADL retraining, balance, retraining, midline orientation, Rt attention, NMR, sequencing, motor planning   Mobility   mod/max overall  upgraded to min/mod overall   cognition, awareness, attention, initiation, NMR, postural control, midline orientation, transfers, ambulation, w/c propulsion    Communication   slow progress, max to total assist  mod-max assist; downgraded   continue to address functional communication via any modality    Safety/Cognition/ Behavioral Observations  still remains limited by endurance, max to total assist   max assist; downgraded   continue to address attention and arousal   Pain   Denies any pain  Remain  pain free  Assess and monitor pain Q shift and PRN.   Skin   Scattered bruising, MASD to bottom  Be free of skin breakdown  Asssess and monitor skin integrity Q Shift and PRN.      *See Care Plan and progress notes for long and short-term goals.  Barriers to Discharge: Uncontrolled diabetes, heavy physical assistance    Possible Resolutions to Barriers:  Continue diabetic management, coordinate with nursing, will likely  need SNF    Discharge Planning/Teaching Needs:  Begin looking for NH bed, niece and nephew aware and are concerned about his medical issues and hemoglobin.      Team Discussion:  Slow progress in therapies, poor endurance and arousal. Seems to be withdrawing in therapies-ques depression. Hemoglobin stable off antibiotics. Diet-Dys 1-honey thick will IV at [facility]. Team to focus on functional tasks in therapies. Foley to stay in after talking to Urol-difficult to get it to begin with-no voiding trial while here. MD feels medially ready for next venue of care-NH. Will go with mid-line.  Revisions to Treatment Plan:  Ready for transfer to next venue of care   Continued Need for Acute Rehabilitation Level of Care: The patient requires daily medical management by a physician with specialized training in physical medicine and rehabilitation for the following conditions: Daily direction of a multidisciplinary physical rehabilitation program to ensure safe treatment while eliciting the highest outcome that is of practical value to the patient.: Yes Daily medical management of patient stability for increased activity during participation in an intensive rehabilitation regime.: Yes Daily analysis of laboratory values and/or radiology reports with any subsequent need for medication adjustment of medical intervention for : Neurological problems  Eshika Reckart, Lemar Livings 10/25/2016, 1:41 PM

## 2016-10-25 NOTE — Progress Notes (Signed)
Speech Language Pathology Daily Session Note  Patient Details  Name: Douglas [name] MRN: [id number] Date of Birth: 05/24/1941  Today's Date: 10/25/2016 SLP Individual Time: 1300-1400 SLP Individual Time Calculation (min): 60 min   Short Term Goals: Week 3: SLP Short Term Goal 1 (Week 3): Pt will answer yes/no questions for 75% accuracy with mod assist verbal cues.   SLP Short Term Goal 2 (Week 3): Pt will follow 1 step commands for 75% accuracy with mod assist verbal cues.   SLP Short Term Goal 3 (Week 3): Pt will consume dys 1 textures and honey thick liquids via teaspoon with min assist for use of swallowing precautions and minimal overt s/s of aspiration.   SLP Short Term Goal 4 (Week 3): Pt will verbalize at the word level during structured tasks for 50% accuracy with max assist multimodal cues. SLP Short Term Goal 5 (Week 3): Pt will utilize multimodal means of communication to convey needs and wants to caregivers with mod assist cues.    Skilled Therapeutic Interventions: Skilled treatment session focused on dysphagia and cognition goals.  SLP facilitated session by providing skilled observation of consumption of dysphagia 1 with honey thick lunch tray. Honey thick liquids presented via tsp. Pt required Max A verbal cues to effectively clear puree textures from right buccal cavity. Pt was total assist when following 1 step direction within task of self-feeding. Pt with 1 utterance "I don't know" and 2 gestures to communicate during session. Pt left upright in bed with bed alarm on and nursing outside pt's door. All needs were within reach. Continue per current plan of care.   Function:  Eating Eating   Modified Consistency Diet: Yes Eating Assist Level: Helper feeds patient;Supervision or verbal cues;Helper checks for pocketed food   Eating Set Up Assist For: Opening containers Helper Scoops Food on Utensil: Every scoop Helper Brings Food to Mouth: Occasionally    Cognition Comprehension Comprehension assist level: Understands basic 25 - 49% of the time/ requires cueing 50 - 75% of the time  Expression Expression assistive device: Other (Comment) (Gestures along with 1 functional phrase) Expression assist level: Expresses basic 25 - 49% of the time/requires cueing 50 - 75% of the time. Uses single words/gestures.  Social Interaction Social Interaction assist level: Interacts appropriately 25 - 49% of time - Needs frequent redirection.  Problem Solving Problem solving assist level: Solves basic 50 - 74% of the time/requires cueing 25 - 49% of the time  Memory Memory assist level: Recognizes or recalls less than 25% of the time/requires cueing greater than 75% of the time    Pain Pain Assessment Pain Assessment: No/denies pain  Therapy/Group: Individual Therapy  Amma Crear 10/25/2016, 1:54 PM

## 2016-10-25 NOTE — Progress Notes (Signed)
Occupational Therapy Session Note  Patient Details  Name: Douglas [name] Perlow MRN: [id number] Date of Birth: 06/14/1941  Today's Date: 10/25/2016 OT Individual Time: [id number] and 1135-1145 OT Individual Time Calculation (min): 58 min and 10 min Missed 20 mins secondary to fatigue    Short Term Goals: Week 3:  OT Short Term Goal 1 (Week 3): Pt will complete bathing with min assist and min cues for sequencing/ideational apraxia OT Short Term Goal 2 (Week 3): Pt will transfer to toilet with max assist of 1 caregiver 3 of 4 attempts OT Short Term Goal 3 (Week 3): Pt will complete LB dressing with mod assist 3 of 4 attempts OT Short Term Goal 4 (Week 3): Pt will utilize RUE as diminished level to complete 2 grooming tasks with min cues  Skilled Therapeutic Interventions/Progress Updates:    1) Treatment session with focus on ADL retraining, functional transfers, and sequencing during familiar, functional tasks.  Pt received in bed awake/alert and willing to get OOB.  Completed bed mobility with min assist for reaching with RUE to promote weight shift.  Stand pivot transfer min-max assist as pt initiated sit >stand with min assist but was unable to motor plan and sequence transfer to Rt into w/c requiring increased physical assistance.  Pt verbalized willingness to engage in bathing at shower level.  Once setup to transfer in to shower, pt would complete sit > stand with min assist but was unable to motor plan transferring to Rt in to shower.  Completed sit <> stand x5 with cues fading from max multimodal to no cues with no increase in motor planning, always returning to w/c unsafely.  Terminated shower task and engaged in bathing and dressing seated at sink, with pt perseverating on washing face and neck.  Despite hand over hand assist for initiation and sequencing, pt continued to perseverate on washing face.  Attempted to wash UB with shirt (as it was white), once therapist donned Rt sleeve pt able to  complete task with mod cues.  Required assist to begin and finish threading Lt pant leg, but pt initiating movement and participating in LB dressing.  Continues to demonstrate Rt inattention with no attempts to thread Rt side of shirt or pants.  Engaged in self-feeding with use of built up handle to increase success, pt required max cues for sips with teaspoon as pt attempting to utilize as straw due to ideational apraxia.  Continues to require scooping of each bite and intermittent bringing utensil to mouth to increase intake.   2) Attempted to engage pt in scheduled therapy session.  Pt asleep in bed, did eventually arouse to name calling and touch to Rt arm.  Encouraged visual scanning to Rt to increase Rt attention as well as engage with therapist.  Pt unable to maintain eyes open, therefore repositioned for improved positioning in bed and left in reclined position. Pt missed 20 mins OT therapy.  Therapy Documentation Precautions:  Precautions Precautions: Fall Precaution Comments: R side inattention, strong push to R side, posterior lean Restrictions Weight Bearing Restrictions: No Pain:  Pt with no c/o pain  See Function Navigator for Current Functional Status.   Therapy/Group: Individual Therapy  Rosalio LoudHOXIE, Lisaanne Lawrie 10/25/2016, 9:46 AM

## 2016-10-25 NOTE — Progress Notes (Signed)
PICC placement pending. Waiting on consent from niece or nephew. Voicemail's left on phone for verbal consent to insert PICC.  Deatra Inaan Angiulli, PA notified waiting consent.

## 2016-10-26 ENCOUNTER — Inpatient Hospital Stay (HOSPITAL_COMMUNITY): Payer: Medicare Other | Admitting: Speech Pathology

## 2016-10-26 ENCOUNTER — Inpatient Hospital Stay (HOSPITAL_COMMUNITY): Payer: Medicare Other | Admitting: Physical Therapy

## 2016-10-26 ENCOUNTER — Inpatient Hospital Stay (HOSPITAL_COMMUNITY): Payer: Medicare Other | Admitting: Occupational Therapy

## 2016-10-26 LAB — CBC
HCT: 36.4 % — ABNORMAL LOW (ref 39.0–52.0)
Hemoglobin: 11.1 g/dL — ABNORMAL LOW (ref 13.0–17.0)
MCH: 27.5 pg (ref 26.0–34.0)
MCHC: 30.5 g/dL (ref 30.0–36.0)
MCV: 90.3 fL (ref 78.0–100.0)
PLATELETS: 78 10*3/uL — AB (ref 150–400)
RBC: 4.03 MIL/uL — ABNORMAL LOW (ref 4.22–5.81)
RDW: 21.3 % — AB (ref 11.5–15.5)
WBC: 8.4 10*3/uL (ref 4.0–10.5)

## 2016-10-26 LAB — BASIC METABOLIC PANEL
Anion gap: 9 (ref 5–15)
BUN: 25 mg/dL — AB (ref 6–20)
CALCIUM: 8 mg/dL — AB (ref 8.9–10.3)
CHLORIDE: 115 mmol/L — AB (ref 101–111)
CO2: 24 mmol/L (ref 22–32)
CREATININE: 1.26 mg/dL — AB (ref 0.61–1.24)
GFR calc Af Amer: >60 mL/min (ref >60)
GFR calc non Af Amer: 54 mL/min — ABNORMAL LOW (ref >60)
Glucose, Bld: 111 mg/dL — ABNORMAL HIGH (ref 65–99)
Potassium: 3.1 mmol/L — ABNORMAL LOW (ref 3.5–5.1)
SODIUM: 148 mmol/L — AB (ref 135–145)

## 2016-10-26 LAB — GLUCOSE, CAPILLARY
GLUCOSE-CAPILLARY: 122 mg/dL — AB (ref 65–99)
Glucose-Capillary: 198 mg/dL — ABNORMAL HIGH (ref 65–99)
Glucose-Capillary: 148 mg/dL — ABNORMAL HIGH (ref 65–99)
Glucose-Capillary: 170 mg/dL — ABNORMAL HIGH (ref 65–99)

## 2016-10-26 MED ORDER — SODIUM CHLORIDE 0.45 % IV SOLN
INTRAVENOUS | Status: DC
  Administered 2016-10-26: 19:00:00 via INTRAVENOUS

## 2016-10-26 MED ORDER — POTASSIUM CHLORIDE 20 MEQ/15ML (10%) PO SOLN
40.0000 meq | Freq: Two times a day (BID) | ORAL | Status: DC
  Administered 2016-10-26 – 2016-10-31 (×10): 40 meq via ORAL
  Filled 2016-10-26 (×10): qty 30

## 2016-10-26 NOTE — Progress Notes (Signed)
Subjective/Complaints: Family did not want PICC line inserted at this time  Therapy notes, patient has been a bit down and not anticipating as well. Very difficult to get history on this. Given severe aphasia. Patient nods not yes. When. asked whether he feels down.                            Unable to obtain ROS due to aphasia   Objective: Vital Signs: Blood pressure (!) 174/59, pulse 96, temperature 98.6 F (37 C), temperature source Oral, resp. rate 17, weight 71.5 kg (157 lb 9.6 oz), SpO2 98 %. No results found. Results for orders placed or performed during the hospital encounter of 10/05/16 (from the past 72 hour(s))  Glucose, capillary     Status: Abnormal   Collection Time: 10/23/16 12:34 PM  Result Value Ref Range   Glucose-Capillary 186 (H) 65 - 99 mg/dL  Glucose, capillary     Status: None   Collection Time: 10/23/16  5:26 PM  Result Value Ref Range   Glucose-Capillary 86 65 - 99 mg/dL  Glucose, capillary     Status: Abnormal   Collection Time: 10/23/16  8:50 PM  Result Value Ref Range   Glucose-Capillary 61 (L) 65 - 99 mg/dL  Glucose, capillary     Status: None   Collection Time: 10/23/16  9:11 PM  Result Value Ref Range   Glucose-Capillary 76 65 - 99 mg/dL  Glucose, capillary     Status: Abnormal   Collection Time: 10/23/16 10:40 PM  Result Value Ref Range   Glucose-Capillary 137 (H) 65 - 99 mg/dL  Glucose, capillary     Status: Abnormal   Collection Time: 10/24/16  6:38 AM  Result Value Ref Range   Glucose-Capillary 111 (H) 65 - 99 mg/dL  Glucose, capillary     Status: Abnormal   Collection Time: 10/24/16 11:26 AM  Result Value Ref Range   Glucose-Capillary 234 (H) 65 - 99 mg/dL  Glucose, capillary     Status: Abnormal   Collection Time: 10/24/16  5:13 PM  Result Value Ref Range   Glucose-Capillary 29 (LL) 65 - 99 mg/dL   Comment 1 Notify RN   Glucose, capillary     Status: Abnormal   Collection Time: 10/24/16  5:28 PM  Result Value Ref Range   Glucose-Capillary 30 (LL) 65 - 99 mg/dL   Comment 1 Document in Chart   Glucose, capillary     Status: Abnormal   Collection Time: 10/24/16  6:00 PM  Result Value Ref Range   Glucose-Capillary 46 (L) 65 - 99 mg/dL  Glucose, capillary     Status: Abnormal   Collection Time: 10/24/16  6:27 PM  Result Value Ref Range   Glucose-Capillary 199 (H) 65 - 99 mg/dL  Glucose, capillary     Status: Abnormal   Collection Time: 10/24/16  6:57 PM  Result Value Ref Range   Glucose-Capillary 191 (H) 65 - 99 mg/dL  Glucose, capillary     Status: Abnormal   Collection Time: 10/24/16  7:49 PM  Result Value Ref Range   Glucose-Capillary 190 (H) 65 - 99 mg/dL  Glucose, capillary     Status: Abnormal   Collection Time: 10/24/16  8:58 PM  Result Value Ref Range   Glucose-Capillary 167 (H) 65 - 99 mg/dL  Glucose, capillary     Status: Abnormal   Collection Time: 10/25/16  6:53 AM  Result Value Ref Range  Glucose-Capillary 113 (H) 65 - 99 mg/dL  Occult blood card to lab, stool     Status: None   Collection Time: 10/25/16 10:51 AM  Result Value Ref Range   Fecal Occult Bld NEGATIVE NEGATIVE  Glucose, capillary     Status: Abnormal   Collection Time: 10/25/16 11:42 AM  Result Value Ref Range   Glucose-Capillary 224 (H) 65 - 99 mg/dL  Occult blood card to lab, stool     Status: None   Collection Time: 10/25/16  3:00 PM  Result Value Ref Range   Fecal Occult Bld NEGATIVE NEGATIVE  Glucose, capillary     Status: Abnormal   Collection Time: 10/25/16  4:51 PM  Result Value Ref Range   Glucose-Capillary 212 (H) 65 - 99 mg/dL  Glucose, capillary     Status: Abnormal   Collection Time: 10/25/16  9:08 PM  Result Value Ref Range   Glucose-Capillary 187 (H) 65 - 99 mg/dL  CBC     Status: Abnormal   Collection Time: 10/26/16  6:20 AM  Result Value Ref Range   WBC 8.4 4.0 - 10.5 K/uL   RBC 4.03 (L) 4.22 - 5.81 MIL/uL   Hemoglobin 11.1 (L) 13.0 - 17.0 g/dL   HCT 36.4 (L) 39.0 - 52.0 %   MCV 90.3 78.0 -  100.0 fL   MCH 27.5 26.0 - 34.0 pg   MCHC 30.5 30.0 - 36.0 g/dL   RDW 21.3 (H) 11.5 - 15.5 %   Platelets 78 (L) 150 - 400 K/uL    Comment: CONSISTENT WITH PREVIOUS RESULT  Basic metabolic panel     Status: Abnormal   Collection Time: 10/26/16  6:20 AM  Result Value Ref Range   Sodium 148 (H) 135 - 145 mmol/L   Potassium 3.1 (L) 3.5 - 5.1 mmol/L   Chloride 115 (H) 101 - 111 mmol/L   CO2 24 22 - 32 mmol/L   Glucose, Bld 111 (H) 65 - 99 mg/dL   BUN 25 (H) 6 - 20 mg/dL   Creatinine, Ser 1.26 (H) 0.61 - 1.24 mg/dL   Calcium 8.0 (L) 8.9 - 10.3 mg/dL   GFR calc non Af Amer 54 (L) >60 mL/min   GFR calc Af Amer >60 >60 mL/min    Comment: (NOTE) The eGFR has been calculated using the CKD EPI equation. This calculation has not been validated in all clinical situations. eGFR's persistently <60 mL/min signify possible Chronic Kidney Disease.    Anion gap 9 5 - 15  Glucose, capillary     Status: Abnormal   Collection Time: 10/26/16  6:45 AM  Result Value Ref Range   Glucose-Capillary 122 (H) 65 - 99 mg/dL      General: NAD. Vital signs reviewed.  Psych: Unable to assess due to aphasia Heart: RRR. Lungs: CTA bilaterally. Abdomen: Positive bowel sounds, soft  Skin: Erythema on dorsum of left foot.    Neurologic: Alert Expressive language deficits, garbled speech Inconsistently following commands, moving b/l UE and LLE.  Follows some simple commands Unable to assess MMT and sensation due to language Musculoskeletal: No edema. No tenderness.  Assessment/Plan: 1. 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 Physiatrist is providing close team supervision and 24 hour management of active medical problems listed below. Physiatrist and rehab team continue to assess barriers to discharge/monitor patient progress toward functional and medical  goals.  FIM: Function - Bathing Bathing activity did not occur: Refused Position: Wheelchair/chair at sink Body parts bathed by patient: Right arm, Left arm, Chest, Abdomen, Left upper leg Body parts bathed by helper: Front perineal area, Buttocks, Right upper leg, Right lower leg, Left lower leg, Back Bathing not applicable: Right lower leg, Left lower leg Assist Level:  (Mod assist)  Function- Upper Body Dressing/Undressing What is the patient wearing?: Pull over shirt/dress Pull over shirt/dress - Perfomed by patient: Put head through opening, Thread/unthread left sleeve Pull over shirt/dress - Perfomed by helper: Thread/unthread right sleeve, Pull shirt over trunk Assist Level:  (Mod assist) Set up : To obtain clothing/put away Function - Lower Body Dressing/Undressing What is the patient wearing?: Pants, Non-skid slipper socks Position: Bed Underwear - Performed by helper: Thread/unthread right underwear leg, Thread/unthread left underwear leg, Pull underwear up/down Pants- Performed by patient: Thread/unthread left pants leg, Pull pants up/down Pants- Performed by helper: Thread/unthread right pants leg Non-skid slipper socks- Performed by patient: Don/doff left sock Non-skid slipper socks- Performed by helper: Don/doff right sock, Don/doff left sock Shoes - Performed by helper: Don/doff right shoe, Don/doff left shoe, Fasten right, Fasten left TED Hose - Performed by helper: Don/doff right TED hose, Don/doff left TED hose Assist for footwear: Maximal assist Assist for lower body dressing:  (Max assist)  Function - Toileting Toileting activity did not occur: No continent bowel/bladder event Toileting steps completed by helper: Adjust clothing prior to toileting, Performs perineal hygiene, Adjust clothing after toileting Toileting Assistive Devices: Other (comment) (holding onto sink) Assist level: Two helpers  Function - Air cabin crew transfer activity did not  occur: N/A Toilet transfer assistive device: Bedside commode Assist level to toilet: 2 helpers Assist level from toilet: 2 helpers Assist level to bedside commode (at bedside): 2 helpers Assist level from bedside commode (at bedside): 2 helpers  Function - Chair/bed transfer Chair/bed transfer activity did not occur: Safety/medical concerns Chair/bed transfer method: Stand pivot Chair/bed transfer assist level: Moderate assist (Pt 50 - 74%/lift or lower) Chair/bed transfer assistive device: Mechanical lift Mechanical lift: Maximove Chair/bed transfer details: Manual facilitation for weight shifting, Tactile cues for posture, Verbal cues for sequencing, Verbal cues for technique, Manual facilitation for placement  Function - Locomotion: Wheelchair Will patient use wheelchair at discharge?: Yes Type: Manual Wheelchair activity did not occur: Safety/medical concerns Max wheelchair distance: 20 Assist Level: Moderate assistance (Pt 50 - 74%) Wheel 50 feet with 2 turns activity did not occur: Safety/medical concerns Assist Level: Total assistance (Pt < 25%) Wheel 150 feet activity did not occur: Safety/medical concerns Turns around,maneuvers to table,bed, and toilet,negotiates 3% grade,maneuvers on rugs and over doorsills: No Function - Locomotion: Ambulation Ambulation activity did not occur: Safety/medical concerns Assistive device: Other (comment) (grocery cart) Max distance: 120 Assist level: Maximal assist (Pt 25 - 49%) Walk 10 feet activity did not occur: Safety/medical concerns Assist level: Maximal assist (Pt 25 - 49%) Walk 50 feet with 2 turns activity did not occur:  (w/c follow for safety) Assist level: 2 helpers Walk 150 feet activity did not occur: Safety/medical concerns Walk 10 feet on uneven surfaces activity did not occur: Safety/medical concerns  Function - Comprehension Comprehension: Auditory Comprehension assist level: Understands basic 25 - 49% of the time/  requires cueing 50 - 75% of the time  Function - Expression Expression: Nonverbal Expression assistive device: Other (Comment) (Gestures along with 1 functional phrase) Expression assist level: Expresses basic 25 - 49% of the time/requires cueing 50 - 75%  of the time. Uses single words/gestures.  Function - Social Interaction Social Interaction assist level: Interacts appropriately 25 - 49% of time - Needs frequent redirection.  Function - Problem Solving Problem solving assist level: Solves basic 25 - 49% of the time - needs direction more than half the time to initiate, plan or complete simple activities  Function - Memory Memory assist level: Recognizes or recalls less than 25% of the time/requires cueing greater than 75% of the time Patient normally able to recall (first 3 days only): That he or she is in a hospital   Medical Problem List and Plan: 1.  Right-sided weakness, aphasia, dysphagia secondary to left MCA infarct  Cont CIR, PT, OT, speech, stable for nursing home placement. Main concern is his intake of both fluids and' meals. He does well if he is fed 2.  DVT Prophylaxis/Anticoagulation: Subcutaneous Lovenox, holding, plt ct recovering, stop, baby aspirin, given ongoing GI bleeding. Appreciate hematology and GI notes  Will need at least weekly CBC to monitor hemoglobin  SCDs ordered 3. Pain Management: Tylenol as needed 4. Dysphagia. Dysphagia #1 nectar liquids. Monitor for any signs of aspiration.   Added daily at bedtime IV fluids 5. Neuropsych: This patient is capable of making decisions on his own behalf. 6. Skin/Wound Care: Routine skin checks 7. Fluids/Electrolytes/Nutrition: Routine I&Os, IV fluid qhs    8. Aspiration pneumonia. resolved 9. Diabetes mellitus with peripheral neuropathy. Hemoglobin A1c 7.2.   Check blood sugars before meals and at bedtime  Currently Lantus insulin 16 units daily . A.m. CBGs are in desired range  Novolog 4U with meals added 12/25,  increased to 8U on 12/26, increased to Elizabeth on 12/27  CBG's higher in mid day to PM.--increased lantus to 20u q 0800 on 12/30, will increase to 25 units daily at bedtime-  current readings in acceptable range CBG (last 3)   Recent Labs  10/25/16 1651 10/25/16 2108 10/26/16 0645  GLUCAP 212* 187* 122*    10. Hypertension/tachycardia. Lopressor 12.5 mg twice a day. Monitor heart rate with increased mobility, systolic elevation will increase Lopressor   Vitals:   10/25/16 1406 10/26/16 0530  BP: (!) 160/55 (!) 174/59  Pulse: 95 96  Resp: 17 17  Temp: 97.7 F (36.5 C) 98.6 F (37 C)   11. Stage III chronic kidney disease. Baseline creatinine 1.62. Follow-up chemistries, Currently at baseline  BMP Latest Ref Rng & Units 10/26/2016 10/23/2016 10/21/2016  Glucose 65 - 99 mg/dL 111(H) 155(H) 94  BUN 6 - 20 mg/dL 25(H) 21(H) 23(H)  Creatinine 0.61 - 1.24 mg/dL 1.26(H) 1.36(H) 1.34(H)  Sodium 135 - 145 mmol/L 148(H) 150(H) 150(H)  Potassium 3.5 - 5.1 mmol/L 3.1(L) 3.0(L) 3.5  Chloride 101 - 111 mmol/L 115(H) 110 113(H)  CO2 22 - 32 mmol/L '24 29 23  '$ Calcium 8.9 - 10.3 mg/dL 8.0(L) 8.7(L) 8.2(L)   12. Mood. Depakote 250 mg BID added for bouts of agitation and restlessness. 13. Hypothyroidism. Synthroid 14. Hyperlipidemia. Lipitor 15. Anemia, acute blood loss. Also with pancytopenia related to infection, now improving.     GI consulted, spoke to GI with plans for EGD, family refused, spoke with GI again, cont PPI  Heme/Onc consulted -slow GI bleed, Heme/Onc on consult,  , improvement in hemoglobin status post transfusion No further significant drops in hemoglobin                   16.     17. Thrombocytopenia: Negative. Hit panel Improve, off Lovenox  SRA negative  Will cont to monitor, repeat plt in am 18. Hypoalbuminemia  Supplement started 12/23 19. Acute lower UTI-resolved No voiding trial per uro, f/u as outpt    20. CKD  At baseline  Cont to monitor  HypoK , increase To 40  mEq twice a day  Will cont to monitor   -HS IVF, family does not want PICC line, stop IV fluids and watch BUN, creatinine BMP Latest Ref Rng & Units 10/26/2016 10/23/2016 10/21/2016  Glucose 65 - 99 mg/dL 111(H) 155(H) 94  BUN 6 - 20 mg/dL 25(H) 21(H) 23(H)  Creatinine 0.61 - 1.24 mg/dL 1.26(H) 1.36(H) 1.34(H)  Sodium 135 - 145 mmol/L 148(H) 150(H) 150(H)  Potassium 3.5 - 5.1 mmol/L 3.1(L) 3.0(L) 3.5  Chloride 101 - 111 mmol/L 115(H) 110 113(H)  CO2 22 - 32 mmol/L '24 29 23  '$ Calcium 8.9 - 10.3 mg/dL 8.0(L) 8.7(L) 8.2(L)   21.  Hypernatremia and Chloremia,Sodium, only slightly elevated, chloride, also only slightly elevated, monitor A FACE TO FACE EVALUATION WAS PERFORMED  KIRSTEINS,ANDREW E 10/26/2016, 9:15 AM

## 2016-10-26 NOTE — Progress Notes (Signed)
Speech Language Pathology Daily Session Note  Patient Details  Name: Douglas [name] MRN: [id number] Date of Birth: 09/12/1941  Today's Date: 10/26/2016 SLP Individual Time: 1000-1100 SLP Individual Time Calculation (min): 60 min   Short Term Goals: Week 3: SLP Short Term Goal 1 (Week 3): Pt will answer yes/no questions for 75% accuracy with mod assist verbal cues.   SLP Short Term Goal 2 (Week 3): Pt will follow 1 step commands for 75% accuracy with mod assist verbal cues.   SLP Short Term Goal 3 (Week 3): Pt will consume dys 1 textures and honey thick liquids via teaspoon with min assist for use of swallowing precautions and minimal overt s/s of aspiration.   SLP Short Term Goal 4 (Week 3): Pt will verbalize at the word level during structured tasks for 50% accuracy with max assist multimodal cues. SLP Short Term Goal 5 (Week 3): Pt will utilize multimodal means of communication to convey needs and wants to caregivers with mod assist cues.    Skilled Therapeutic Interventions: Skilled treatment session focused on dysphagia and cognition goals. SLP facilitated session by providing skilled observation of honey thick liquids via tsp. And puree items. SLP provided Mod A verbal cues for attention to bolus and for oral containment of bolus. Pt with cough at 3 with consumption of honey thick via tsp. Possibility d/t delayed swallow initiation as no hyolaryngeal movement was noted. When cues, pt able to increase oral containment of puree bolus and wipe mouth with washcloth. With Max A multimodal cues, pt unable to communicate at the word level when presented with common objects. Pt returned to nursing station for supervision, left upright in wheelchair with safety belt in place. Continue per current plan of care.   Function:  Eating Eating   Modified Consistency Diet: Yes Eating Assist Level: Helper feeds patient   Eating Set Up Assist For: Opening containers Helper Scoops Food on Utensil: Every  scoop Helper Brings Food to Mouth: Every scoop   Cognition Comprehension Comprehension assist level: Understands basic 25 - 49% of the time/ requires cueing 50 - 75% of the time  Expression   Expression assist level: Expresses basic 25 - 49% of the time/requires cueing 50 - 75% of the time. Uses single words/gestures.  Social Interaction Social Interaction assist level: Interacts appropriately 25 - 49% of time - Needs frequent redirection.  Problem Solving Problem solving assist level: Solves basic 25 - 49% of the time - needs direction more than half the time to initiate, plan or complete simple activities  Memory Memory assist level: Recognizes or recalls less than 25% of the time/requires cueing greater than 75% of the time    Pain Pain Assessment Pain Assessment: No/denies pain  Therapy/Group: Individual Therapy  Aerabella Galasso 10/26/2016, 12:10 PM

## 2016-10-26 NOTE — Progress Notes (Signed)
Social Work Patient ID: Douglas [name], male   DOB: 02/10/1941, 76 y.o.   MRN: [id number]  Spoke with [name]-niece to discuss team conference goals and move to next venue. She has many questions regarding prognosis and Wanting to talk with a MD prior to agreeing to the mid-line placement. She also would like a family conference to discuss what the therapy team feels will be his long term plan. Will have PA/MD contact nancy to see if can answer Her questions and then if still feels needs family conference will arrange one, encouraged her to come and observe pt in therapies to see what the therapists are seeing.

## 2016-10-26 NOTE — Progress Notes (Signed)
Harriett Sineancy, patients niece, verbally consented to insertion of PICC line for patient. New orders for PICC placement and restart 1/2 NS 75cc/hr over 12 hrs per Deatra Inaan Angiulli, PA.

## 2016-10-26 NOTE — Progress Notes (Signed)
Physical Therapy Note  Patient Details  Name: Douglas [name] MRN: [id number] Date of Birth: 09/27/1941 Today's Date: 10/26/2016    Time: 830-920 50 minutes  1:1 No signs/symptoms of pain.  Pt performed squat pivot transfers initially with mod A, manual facilitation for anterior wt shift and pivoting hips. At end of session pt max +2 for squat pivot transfers due to fatigue.  Standing balance with reaching task with focus on Rt LE wt bearing and midline posture with pt able to perform x 20 minutes with few rest breaks with improving balance and posture each attempt.  Gait with shopping cart x 60' with +2 assist, assist for Rt LE placement, wt shift and balance. Pt with increased ataxia and increased pushing when fatigued. Gait with +2 hand held assist with max A x 10 with increased pushing with this technique.  Nu step x 4 minutes for UE/LE reciprocal movements and coordination. Pt then attempts to get off nu step by himself, unwilling to continue task or continue with therapy. Pt asleep in w/c at end of session with quick release belt and at nursing station.   Douglas [name] 10/26/2016, 9:22 AM

## 2016-10-26 NOTE — Progress Notes (Signed)
Physical Therapy Session Note  Patient Details  Name: Douglas [name] MRN: [id number] Date of Birth: 07-23-1941  Today's Date: 10/26/2016 PT Individual Time: 1530-1600 PT Individual Time Calculation (min): 30 min    Short Term Goals: Week 3:  PT Short Term Goal 1 (Week 3): Pt will ambulate x100' with LRAD and mod assist.  PT Short Term Goal 2 (Week 3): Pt will self propel w/c 25' with min assist.  PT Short Term Goal 3 (Week 3): Pt will maintain static standing balance for 3 minutes during a functional task with min assist    Skilled Therapeutic Interventions/Progress Updates:      Pt received supine in bed and agreeable to PT.   Bed mobility to come to sitting with min assist from PT. Mod multimodal cues from PT for proper use of bed features. 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 Max cues for UE placement, but pt unable to correct with manual facilitation from PT.     Transfer to [facility] with squat pivot technique  To R and max assist due to boor body awareness and pushing to the R. Sit<>stand completed with mod-max assist from PT due to poor motor planning. Stand pivot transfer to bed with mod assist and BUE support on therapist. Less pushing noted in stance.   Foot tap on 3 inch step with LLE 2x 3, unable to perform reciprocal tapping due to motor planning deficits and inability to lift R LE during PT session.    Patient returned too room and left supine in with call bell in reach and all needs met.      Therapy Documentation Precautions:  Precautions Precautions: Fall Precaution Comments: R side inattention, strong push to R side, posterior lean Restrictions Weight Bearing Restrictions: No General:   Vital Signs: Therapy Vitals Temp: 97.7 F (36.5 C) Temp Source: Oral Pulse Rate: 99 Resp: 18 BP: (!) 158/70 Patient Position (if appropriate): Sitting Oxygen Therapy SpO2:  99 % O2 Device: Not Delivered Pain:   none.   See Function Navigator for Current Functional Status.   Therapy/Group: Individual Therapy  Lorie Phenix 10/26/2016, 5:40 PM

## 2016-10-26 NOTE — Progress Notes (Signed)
Occupational Therapy Session Note  Patient Details  Name: Douglas [name] MRN: [id number] Date of Birth: 05/05/1941  Today's Date: 10/26/2016 OT Individual Time: [id number] OT Individual Time Calculation (min): 60 min     Short Term Goals: Week 3:  OT Short Term Goal 1 (Week 3): Pt will complete bathing with min assist and min cues for sequencing/ideational apraxia OT Short Term Goal 2 (Week 3): Pt will transfer to toilet with max assist of 1 caregiver 3 of 4 attempts OT Short Term Goal 3 (Week 3): Pt will complete LB dressing with mod assist 3 of 4 attempts OT Short Term Goal 4 (Week 3): Pt will utilize RUE as diminished level to complete 2 grooming tasks with min cues  Skilled Therapeutic Interventions/Progress Updates:    Treatment session with focus on ADL retraining, attention to task, sequencing, and problem solving during dressing, grooming, and self-feeding tasks.  Pt received in bed refusing to get OOB initially during session.  Therefore pt donned pants in bed with assistance only to thread RLE after multiple attempts.  Pt able to pull pants over hips with combination of bridging and rolling.  Pt perseverating on adjusting pants with pulling pants down and then up multiple times.  Educated on hemi-technique to increase success with donning socks, still requiring forward chaining with assist to don over toes and then pt able to complete.  Pt initiated bed mobility to come up to sitting at EOB.  Required min assist for bed mobility and then mod assist stand pivot transfer to w/c with pt standing with min assist but then requiring physical assistance to control lowering in to w/c.  Attempted grooming tasks at sink, with increased ideational apraxia noted with fewer cues and setup from therapist.  Pt never turned on water, attempted to wash face with dry cloth and shave face with comb.  Resistant to cues for proper use of comb.  Engaged in self-feeding with therapist positioned on Rt to  promote scanning to and attention to Rt visual field.    Therapy Documentation Precautions:  Precautions Precautions: Fall Precaution Comments: R side inattention, strong push to R side, posterior lean Restrictions Weight Bearing Restrictions: No Pain: Pain Assessment Pain Assessment: No/denies pain  See Function Navigator for Current Functional Status.   Therapy/Group: Individual Therapy  Rosalio LoudHOXIE, [name] 10/26/2016, 9:43 AM

## 2016-10-27 ENCOUNTER — Inpatient Hospital Stay (HOSPITAL_COMMUNITY): Payer: Medicare Other | Admitting: Occupational Therapy

## 2016-10-27 ENCOUNTER — Inpatient Hospital Stay (HOSPITAL_COMMUNITY): Payer: Medicare Other | Admitting: Speech Pathology

## 2016-10-27 ENCOUNTER — Inpatient Hospital Stay (HOSPITAL_COMMUNITY): Payer: Medicare Other | Admitting: Physical Therapy

## 2016-10-27 DIAGNOSIS — E876 Hypokalemia: Secondary | ICD-10-CM

## 2016-10-27 DIAGNOSIS — E870 Hyperosmolality and hypernatremia: Secondary | ICD-10-CM

## 2016-10-27 LAB — BASIC METABOLIC PANEL
Anion gap: 7 (ref 5–15)
BUN: 24 mg/dL — AB (ref 6–20)
CHLORIDE: 115 mmol/L — AB (ref 101–111)
CO2: 24 mmol/L (ref 22–32)
Calcium: 8.1 mg/dL — ABNORMAL LOW (ref 8.9–10.3)
Creatinine, Ser: 1.3 mg/dL — ABNORMAL HIGH (ref 0.61–1.24)
GFR calc non Af Amer: 52 mL/min — ABNORMAL LOW (ref >60)
Glucose, Bld: 133 mg/dL — ABNORMAL HIGH (ref 65–99)
POTASSIUM: 3.1 mmol/L — AB (ref 3.5–5.1)
SODIUM: 146 mmol/L — AB (ref 135–145)

## 2016-10-27 LAB — GLUCOSE, CAPILLARY
GLUCOSE-CAPILLARY: 122 mg/dL — AB (ref 65–99)
GLUCOSE-CAPILLARY: 159 mg/dL — AB (ref 65–99)
GLUCOSE-CAPILLARY: 209 mg/dL — AB (ref 65–99)
Glucose-Capillary: 235 mg/dL — ABNORMAL HIGH (ref 65–99)

## 2016-10-27 MED ORDER — METOPROLOL TARTRATE 50 MG PO TABS
50.0000 mg | ORAL_TABLET | Freq: Two times a day (BID) | ORAL | Status: DC
  Administered 2016-10-27 – 2016-10-29 (×4): 50 mg via ORAL
  Filled 2016-10-27 (×3): qty 1
  Filled 2016-10-27: qty 2

## 2016-10-27 MED ORDER — INSULIN ASPART 100 UNIT/ML ~~LOC~~ SOLN
14.0000 [IU] | Freq: Three times a day (TID) | SUBCUTANEOUS | Status: DC
  Administered 2016-10-28: 14 [IU] via SUBCUTANEOUS

## 2016-10-27 MED ORDER — SODIUM CHLORIDE 0.9% FLUSH
10.0000 mL | INTRAVENOUS | Status: DC | PRN
  Administered 2016-10-28 – 2016-10-31 (×4): 10 mL
  Filled 2016-10-27 (×4): qty 40

## 2016-10-27 MED ORDER — SODIUM CHLORIDE 0.45 % IV SOLN
INTRAVENOUS | Status: DC
  Administered 2016-10-27 – 2016-10-30 (×3): via INTRAVENOUS

## 2016-10-27 NOTE — Progress Notes (Signed)
Peripherally Inserted Central Catheter/Midline Placement  The IV Nurse has discussed with the patient and/or persons authorized to consent for the patient, the purpose of this procedure and the potential benefits and risks involved with this procedure.  The benefits include less needle sticks, lab draws from the catheter, and the patient may be discharged home with the catheter. Risks include, but not limited to, infection, bleeding, blood clot (thrombus formation), and puncture of an artery; nerve damage and irregular heartbeat and possibility to perform a PICC exchange if needed/ordered by physician.  Alternatives to this procedure were also discussed.  Bard Power PICC patient education guide, fact sheet on infection prevention and patient information card has been provided to patient /or left at bedside.    PICC/Midline Placement Documentation     Telephone consent by Niece   Maximino GreenlandLumban, Isair Inabinet Albarece 10/27/2016, 12:20 PM

## 2016-10-27 NOTE — Progress Notes (Signed)
Speech Language Pathology Weekly Progress and Session Note  Patient Details  Name: Douglas [name] MRN: [id number] Date of Birth: 1941/09/01  Beginning of progress report period: October 20, 2016  End of progress report period:  October 27, 2016   Today's Date: 10/27/2016 SLP Individual Time: 1500-1530 SLP Individual Time Calculation (min): 30 min   Short Term Goals: Week 3: SLP Short Term Goal 1 (Week 3): Pt will answer yes/no questions for 75% accuracy with mod assist verbal cues.   SLP Short Term Goal 1 - Progress (Week 3): Met SLP Short Term Goal 2 (Week 3): Pt will follow 1 step commands for 75% accuracy with mod assist verbal cues.   SLP Short Term Goal 2 - Progress (Week 3): Progressing toward goal SLP Short Term Goal 3 (Week 3): Pt will consume dys 1 textures and honey thick liquids via teaspoon with min assist for use of swallowing precautions and minimal overt s/s of aspiration.   SLP Short Term Goal 3 - Progress (Week 3): Revised due to lack of progress SLP Short Term Goal 4 (Week 3): Pt will verbalize at the word level during structured tasks for 50% accuracy with max assist multimodal cues. SLP Short Term Goal 4 - Progress (Week 3): Progressing toward goal SLP Short Term Goal 5 (Week 3): Pt will utilize multimodal means of communication to convey needs and wants to caregivers with mod assist cues.   SLP Short Term Goal 5 - Progress (Week 3): Revised due to lack of progress    New Short Term Goals: Week 4: SLP Short Term Goal 1 (Week 4): Pt will answer yes/no questions for >80% accuracy with mod assist verbal cues.   SLP Short Term Goal 2 (Week 4): Pt will follow 1 step commands for 75% accuracy with mod assist verbal cues.   SLP Short Term Goal 3 (Week 4): Pt will consume dys 1 textures and honey thick liquids via teaspoon with mod assist for use of swallowing precautions and minimal overt s/s of aspiration.   SLP Short Term Goal 4 (Week 4): Pt will verbalize at the word  level during structured tasks for 50% accuracy with max assist multimodal cues. SLP Short Term Goal 5 (Week 4): Pt will utilize multimodal means of communication to convey needs and wants to caregivers with max assist cues.    Weekly Progress Updates: Pt has made limited gains this reporting period and has met 1 out of 5 short term goals.  Pt continues to require mod-max assist multimodal cues for use of swallowing precautions due to significant motor planning impairment, cognitive deficits, and oropharyngeal dysphagia.  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  Progress has been further limited by decreased motivation to participate in therapies and pt's fluctuating attention to tasks.  Pt and family education is ongoing.  Pt would continue to benefit from skilled ST while inpatient in order to maximize functional independence and reduce burden of care prior to discharge.     Intensity: Minumum of 1-2 x/day, 30 to 90 minutes Frequency: 3 to 5 out of 7 days Duration/Length of Stay: 28 days  Treatment/Interventions: Cognitive remediation/compensation;Cueing hierarchy;Dysphagia/aspiration precaution training;Environmental controls;Functional tasks;Internal/external aids;Patient/family education   Daily Session  Skilled Therapeutic Interventions: Pt was seen for skilled ST for family education.  Pt's niece was present for the duration of today's therapy session and had questions primarily related to pt's cognitive-linguistic deficits.  SLP discussed pt's current goals and progress  in therapies, including pt's decreased motivation to participate in therapies over the last week.  SLP also provided skilled education regarding how pt's sustained attention and motor planning is impacting pt's functional communication.  Discussed ways to maximize pt's communication in between therapy sessions; including asking pt simple yes/no questions and interpretation  of nonverbal cues from pt.  Pt's niece verbalized understanding of education and all questions were answered to her satisfaction at this time.  Pt was left in wheelchair with family at bedside.  Goals updated on this date to reflect current progress in therapy.     Function:   Eating Eating                 Cognition Comprehension Comprehension assist level: Understands basic 25 - 49% of the time/ requires cueing 50 - 75% of the time  Expression   Expression assist level: Expresses basic 25 - 49% of the time/requires cueing 50 - 75% of the time. Uses single words/gestures.  Social Interaction Social Interaction assist level: Interacts appropriately 25 - 49% of time - Needs frequent redirection.  Problem Solving Problem solving assist level: Solves basic 25 - 49% of the time - needs direction more than half the time to initiate, plan or complete simple activities  Memory Memory assist level: Recognizes or recalls less than 25% of the time/requires cueing greater than 75% of the time   General    Pain Pain Assessment Pain Assessment: No/denies pain  Therapy/Group: Individual Therapy  Persia Lintner, Selinda Orion 10/27/2016, 5:02 PM

## 2016-10-27 NOTE — Progress Notes (Signed)
Occupational Therapy Session Note  Patient Details  Name: Douglas [name] MRN: [id number] Date of Birth: 01/05/1941  Today's Date: 10/27/2016 OT Individual Time: 1331-1403 OT Individual Time Calculation (min): 32 min   Skilled Therapeutic Interventions/Progress Updates:    OT session focused on family education, transfer training, and functional use of R Ue. Discussed pt progress with family and answered questions regarding goals and dc plan. Stand-pivot transfers with overall Mod A +2. R NMR and functional use of R hand using familiar object (bowling ball; pt managed bowling ally). Pt able to demonstrate how to hold a bowling ball using L hand, but required hand-over hand assist to place fingers using L hand. Pt left seated in wc at end of session with safety belt on and family present.   See Function Navigator for Current Functional Status.   Therapy/Group: Individual Therapy  [name] 10/27/2016, 2:06 PM

## 2016-10-27 NOTE — Plan of Care (Signed)
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  Problem: RH Grooming Goal: LTG Patient will perform grooming w/assist,cues/equip (OT) LTG: Patient will perform grooming with assist, with/without cues using equipment (OT)  Downgraded due to slow progress towards goals  Problem: RH Dressing Goal: LTG Patient will perform lower body dressing w/assist (OT) LTG: Patient will perform lower body dressing with assist, with/without cues in positioning using equipment (OT)  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

## 2016-10-27 NOTE — Progress Notes (Signed)
Physical Therapy Session Note  Patient Details  Name: Douglas [name] MRN: [id number] Date of Birth: 08/27/1941  Today's Date: 10/27/2016 PT Individual Time: 1102-1130 PT Individual Time Calculation (min): 28 min     Skilled Therapeutic Interventions/Progress Updates:   Pt found at nursing station.  Pt lethargic throughout session.  Attempted addressing weight shifting in wheelchair to work on mobility for transfers and seated reaching balance activities, however, pt continually fell asleep.  Pt taken back to nurses station and nurse and therapist performed +2 transfer to transfer pt back to bed and then +2 for supine to sit and scooting up in bed.  Pt denied pain with session today.  Pt left in care of nursing for placement of PICC line.  Missed 32 minutes due to patients lethargy.  Therapy Documentation Precautions:  Precautions Precautions: Fall Precaution Comments: R side inattention, strong push to R side, posterior lean Restrictions Weight Bearing Restrictions: No   See Function Navigator for Current Functional Status.   Therapy/Group: Individual Therapy  Shannelle Alguire Elveria RisingM Amarri Satterly 10/27/2016, 11:43 AM

## 2016-10-27 NOTE — Progress Notes (Addendum)
Subjective/Complaints: Pt seen laying in bed this AM.  Unintelligible.    Unable to obtain ROS due to aphasia  Objective: Vital Signs: Blood pressure (!) 155/72, pulse 95, temperature 97.7 F (36.5 C), temperature source Oral, resp. rate 18, weight 71.5 kg (157 lb 9.6 oz), SpO2 100 %. No results found. Results for orders placed or performed during the hospital encounter of 10/05/16 (from the past 72 hour(s))  Glucose, capillary     Status: Abnormal   Collection Time: 10/24/16 11:26 AM  Result Value Ref Range   Glucose-Capillary 234 (H) 65 - 99 mg/dL  Glucose, capillary     Status: Abnormal   Collection Time: 10/24/16  5:13 PM  Result Value Ref Range   Glucose-Capillary 29 (LL) 65 - 99 mg/dL   Comment 1 Notify RN   Glucose, capillary     Status: Abnormal   Collection Time: 10/24/16  5:28 PM  Result Value Ref Range   Glucose-Capillary 30 (LL) 65 - 99 mg/dL   Comment 1 Document in Chart   Glucose, capillary     Status: Abnormal   Collection Time: 10/24/16  6:00 PM  Result Value Ref Range   Glucose-Capillary 46 (L) 65 - 99 mg/dL  Glucose, capillary     Status: Abnormal   Collection Time: 10/24/16  6:27 PM  Result Value Ref Range   Glucose-Capillary 199 (H) 65 - 99 mg/dL  Glucose, capillary     Status: Abnormal   Collection Time: 10/24/16  6:57 PM  Result Value Ref Range   Glucose-Capillary 191 (H) 65 - 99 mg/dL  Glucose, capillary     Status: Abnormal   Collection Time: 10/24/16  7:49 PM  Result Value Ref Range   Glucose-Capillary 190 (H) 65 - 99 mg/dL  Glucose, capillary     Status: Abnormal   Collection Time: 10/24/16  8:58 PM  Result Value Ref Range   Glucose-Capillary 167 (H) 65 - 99 mg/dL  Glucose, capillary     Status: Abnormal   Collection Time: 10/25/16  6:53 AM  Result Value Ref Range   Glucose-Capillary 113 (H) 65 - 99 mg/dL  Occult blood card to lab, stool     Status: None   Collection Time: 10/25/16 10:51 AM  Result Value Ref Range   Fecal Occult Bld  NEGATIVE NEGATIVE  Glucose, capillary     Status: Abnormal   Collection Time: 10/25/16 11:42 AM  Result Value Ref Range   Glucose-Capillary 224 (H) 65 - 99 mg/dL  Occult blood card to lab, stool     Status: None   Collection Time: 10/25/16  3:00 PM  Result Value Ref Range   Fecal Occult Bld NEGATIVE NEGATIVE  Glucose, capillary     Status: Abnormal   Collection Time: 10/25/16  4:51 PM  Result Value Ref Range   Glucose-Capillary 212 (H) 65 - 99 mg/dL  Glucose, capillary     Status: Abnormal   Collection Time: 10/25/16  9:08 PM  Result Value Ref Range   Glucose-Capillary 187 (H) 65 - 99 mg/dL  CBC     Status: Abnormal   Collection Time: 10/26/16  6:20 AM  Result Value Ref Range   WBC 8.4 4.0 - 10.5 K/uL   RBC 4.03 (L) 4.22 - 5.81 MIL/uL   Hemoglobin 11.1 (L) 13.0 - 17.0 g/dL   HCT 36.4 (L) 39.0 - 52.0 %   MCV 90.3 78.0 - 100.0 fL   MCH 27.5 26.0 - 34.0 pg   MCHC 30.5 30.0 -  36.0 g/dL   RDW 46.5 (H) 03.5 - 46.5 %   Platelets 78 (L) 150 - 400 K/uL    Comment: CONSISTENT WITH PREVIOUS RESULT  Basic metabolic panel     Status: Abnormal   Collection Time: 10/26/16  6:20 AM  Result Value Ref Range   Sodium 148 (H) 135 - 145 mmol/L   Potassium 3.1 (L) 3.5 - 5.1 mmol/L   Chloride 115 (H) 101 - 111 mmol/L   CO2 24 22 - 32 mmol/L   Glucose, Bld 111 (H) 65 - 99 mg/dL   BUN 25 (H) 6 - 20 mg/dL   Creatinine, Ser 6.81 (H) 0.61 - 1.24 mg/dL   Calcium 8.0 (L) 8.9 - 10.3 mg/dL   GFR calc non Af Amer 54 (L) >60 mL/min   GFR calc Af Amer >60 >60 mL/min    Comment: (NOTE) The eGFR has been calculated using the CKD EPI equation. This calculation has not been validated in all clinical situations. eGFR's persistently <60 mL/min signify possible Chronic Kidney Disease.    Anion gap 9 5 - 15  Glucose, capillary     Status: Abnormal   Collection Time: 10/26/16  6:45 AM  Result Value Ref Range   Glucose-Capillary 122 (H) 65 - 99 mg/dL  Glucose, capillary     Status: Abnormal   Collection  Time: 10/26/16 11:34 AM  Result Value Ref Range   Glucose-Capillary 198 (H) 65 - 99 mg/dL  Glucose, capillary     Status: Abnormal   Collection Time: 10/26/16  4:43 PM  Result Value Ref Range   Glucose-Capillary 148 (H) 65 - 99 mg/dL  Glucose, capillary     Status: Abnormal   Collection Time: 10/26/16  9:20 PM  Result Value Ref Range   Glucose-Capillary 170 (H) 65 - 99 mg/dL  Basic metabolic panel     Status: Abnormal   Collection Time: 10/27/16  6:31 AM  Result Value Ref Range   Sodium 146 (H) 135 - 145 mmol/L   Potassium 3.1 (L) 3.5 - 5.1 mmol/L   Chloride 115 (H) 101 - 111 mmol/L   CO2 24 22 - 32 mmol/L   Glucose, Bld 133 (H) 65 - 99 mg/dL   BUN 24 (H) 6 - 20 mg/dL   Creatinine, Ser 2.75 (H) 0.61 - 1.24 mg/dL   Calcium 8.1 (L) 8.9 - 10.3 mg/dL   GFR calc non Af Amer 52 (L) >60 mL/min   GFR calc Af Amer >60 >60 mL/min    Comment: (NOTE) The eGFR has been calculated using the CKD EPI equation. This calculation has not been validated in all clinical situations. eGFR's persistently <60 mL/min signify possible Chronic Kidney Disease.    Anion gap 7 5 - 15  Glucose, capillary     Status: Abnormal   Collection Time: 10/27/16  6:42 AM  Result Value Ref Range   Glucose-Capillary 122 (H) 65 - 99 mg/dL    General: NAD. Vital signs reviewed.  Psych: Unable to assess due to aphasia Heart: RRR. No JVD.  Lungs: CTA bilaterally. Unlabored.  Abdomen: Positive bowel sounds, soft  Skin: Warm and dry. Intact.  Neurologic: Alert Global aphasia ?Expressive >receptive  Inconsistently following commands, moving all extremities.  Follows some simple commands Psych: Unable to assess due to aphasia  Assessment/Plan: 1. 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 Physiatrist is providing close  team supervision and 24 hour management of active  medical problems listed below. Physiatrist and rehab team continue to assess barriers to discharge/monitor patient progress toward functional and medical goals. FIM: Function - Bathing Bathing activity did not occur: Refused Position: Wheelchair/chair at sink Body parts bathed by patient: Right arm, Left arm, Chest, Abdomen, Left upper leg Body parts bathed by helper: Front perineal area, Buttocks, Right upper leg, Right lower leg, Left lower leg, Back Bathing not applicable: Right lower leg, Left lower leg Assist Level:  (Mod assist)  Function- Upper Body Dressing/Undressing What is the patient wearing?: Pull over shirt/dress Pull over shirt/dress - Perfomed by patient: Put head through opening, Thread/unthread left sleeve Pull over shirt/dress - Perfomed by helper: Thread/unthread right sleeve, Pull shirt over trunk Assist Level:  (Mod assist) Set up : To obtain clothing/put away Function - Lower Body Dressing/Undressing What is the patient wearing?: Pants, Non-skid slipper socks Position: Bed Underwear - Performed by helper: Thread/unthread right underwear leg, Thread/unthread left underwear leg, Pull underwear up/down Pants- Performed by patient: Thread/unthread left pants leg, Pull pants up/down Pants- Performed by helper: Thread/unthread right pants leg Non-skid slipper socks- Performed by patient: Don/doff left sock Non-skid slipper socks- Performed by helper: Don/doff right sock, Don/doff left sock Shoes - Performed by helper: Don/doff right shoe, Don/doff left shoe, Fasten right, Fasten left TED Hose - Performed by helper: Don/doff right TED hose, Don/doff left TED hose Assist for footwear: Maximal assist Assist for lower body dressing:  (Max assist)  Function - Toileting Toileting activity did not occur: No continent bowel/bladder event Toileting steps completed by helper: Adjust clothing prior to toileting, Performs perineal hygiene, Adjust clothing after  toileting Toileting Assistive Devices: Other (comment) (holding onto sink) Assist level: Two helpers  Function - Air cabin crew transfer activity did not occur: N/A Toilet transfer assistive device: Bedside commode Assist level to toilet: 2 helpers Assist level from toilet: 2 helpers Assist level to bedside commode (at bedside): 2 helpers Assist level from bedside commode (at bedside): 2 helpers  Function - Chair/bed transfer Chair/bed transfer activity did not occur: Safety/medical concerns Chair/bed transfer method: Stand pivot Chair/bed transfer assist level: Moderate assist (Pt 50 - 74%/lift or lower) Chair/bed transfer assistive device: Mechanical lift Mechanical lift: Maximove Chair/bed transfer details: Manual facilitation for weight shifting, Tactile cues for posture, Verbal cues for sequencing, Verbal cues for technique, Manual facilitation for placement  Function - Locomotion: Wheelchair Will patient use wheelchair at discharge?: Yes Type: Manual Wheelchair activity did not occur: Safety/medical concerns Max wheelchair distance: 20 Assist Level: Moderate assistance (Pt 50 - 74%) Wheel 50 feet with 2 turns activity did not occur: Safety/medical concerns Assist Level: Total assistance (Pt < 25%) Wheel 150 feet activity did not occur: Safety/medical concerns Turns around,maneuvers to table,bed, and toilet,negotiates 3% grade,maneuvers on rugs and over doorsills: No Function - Locomotion: Ambulation Ambulation activity did not occur: Safety/medical concerns Assistive device: Other (comment) (grocery cart) Max distance: 120 Assist level: Maximal assist (Pt 25 - 49%) Walk 10 feet activity did not occur: Safety/medical concerns Assist level: Maximal assist (Pt 25 - 49%) Walk 50 feet with 2 turns activity did not occur:  (w/c follow for safety) Assist level: 2 helpers Walk 150 feet activity did not occur: Safety/medical concerns Walk 10 feet on uneven surfaces  activity did not occur: Safety/medical concerns  Function - Comprehension Comprehension: Auditory Comprehension assist level: Understands basic 25 - 49% of the time/ requires cueing 50 - 75% of the time  Function -  Expression Expression: Nonverbal Expression assistive device: Other (Comment) (Gestures along with 1 functional phrase) Expression assist level: Expresses basic 25 - 49% of the time/requires cueing 50 - 75% of the time. Uses single words/gestures.  Function - Social Interaction Social Interaction assist level: Interacts appropriately 25 - 49% of time - Needs frequent redirection.  Function - Problem Solving Problem solving assist level: Solves basic 25 - 49% of the time - needs direction more than half the time to initiate, plan or complete simple activities  Function - Memory Memory assist level: Recognizes or recalls less than 25% of the time/requires cueing greater than 75% of the time Patient normally able to recall (first 3 days only): That he or she is in a hospital   Medical Problem List and Plan: 1.  Right-sided weakness, aphasia, dysphagia secondary to left MCA infarct  Cont CIR.  2.  DVT Prophylaxis/Anticoagulation: Subcutaneous Lovenox, ASA holding.  Appreciate hematology and GI notes  Will need at least weekly CBC to monitor hemoglobin  SCDs ordered 3. Pain Management: Tylenol as needed 4. Dysphagia. Dysphagia #1 honey liquids. Monitor for any signs of aspiration.   Added daily at bedtime IV fluids 5. Neuropsych: This patient is capable of making decisions on his own behalf. 6. Skin/Wound Care: Routine skin checks 7. Fluids/Electrolytes/Nutrition: Routine I&Os, IV fluid qhs 8. Aspiration pneumonia. resolved 9. Diabetes mellitus with peripheral neuropathy. Hemoglobin A1c 7.2.   Check blood sugars before meals and at bedtime  Lantus 25U daily  Novolog 4U with meals added 12/25, increased to 8U on 12/26, increased to [place] on 12/27, increased to 14U on 1/12 10.  Hypertension/tachycardia.   Monitor heart rate with increased mobility  Increased Lopressor to 50 on 1/12 11. Stage III chronic kidney disease. Baseline creatinine 1.62.   Cr. 1.30 on 1/12  Labs ordered for Monday 12. Mood. Depakote 250 mg BID added for bouts of agitation and restlessness. 13. Hypothyroidism. Synthroid 14. Hyperlipidemia. Lipitor 15. Anemia, acute blood loss. Also with pancytopenia related to infection, now improving.   GI consulted, spoke to GI with plans for EGD, family refused, spoke with GI again, cont PPI  Heme/Onc consulted -slow GI bleed, Heme/Onc on consult,  improvement in hemoglobin status post transfusion  Hb 11.1 on 1/11  Cont to monitor  Labs ordered for Monday 16. Thrombocytopenia: Negative. Hit panel neg.  SRA negative  Plts 78 on 1/11, trending down again  Labs ordered for Monday 17. Hypoalbuminemia  Supplement started 12/23 18. Acute lower UTI-resolved No voiding trial per uro, f/u as outpt 19. HypoK  Increased To 40 mEq twice a day  Will cont to monitor  -HS IVF, family does not want PICC line, stop IV fluids and watch BUN, creatinine, stopped 1/12  Labs ordered for Monday, including Mag 20. Mild hypernatremia  Na 146 on 1/12  Cont to monitor  Labs ordered for Monday  A FACE TO FACE EVALUATION WAS PERFORMED  Ankit Lorie Phenix 10/27/2016, 10:52 AM

## 2016-10-27 NOTE — Progress Notes (Signed)
Occupational Therapy Weekly Progress Note  Patient Details  Name: Douglas [name] MRN: [id number] Date of Birth: July 16, 1941  Beginning of progress report period: October 20, 2016 End of progress report period: October 27, 2016  Today's Date: 10/27/2016 OT Individual Time: 0630-1601 OT Individual Time Calculation (min): 60 min    Patient has met 0 of 4 short term goals.  Pt has demonstrated a decrease in motivation and participation in treatment sessions this week.  Pt continues to require mod-total +2 for transfers and LB self-care tasks due to pushing tendencies and decreased attention to Rt environment and RUE/RLE.  Pt continues to demonstrate ideational apraxia, decreased motor planning, sequencing, and awareness during ADLs.  Patient continues to demonstrate the following deficits: Rt hemiparesis, Rt inattention, pushing tendencies, impaired initiation, sequencing, problem solving, and activity tolerance, decreased sitting and standing balance and therefore will continue to benefit from skilled OT intervention to enhance overall performance with BADL and Reduce care partner burden.  Patient not progressing toward long term goals.  See goal revision..  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  OT Short Term Goals Week 3:  OT Short Term Goal 1 (Week 3): Pt will complete bathing with min assist and min cues for sequencing/ideational apraxia OT Short Term Goal 1 - Progress (Week 3): Progressing toward goal OT Short Term Goal 2 (Week 3): Pt will transfer to toilet with max assist of 1 caregiver 3 of 4 attempts OT Short Term Goal 2 - Progress (Week 3): Progressing toward goal OT Short Term Goal 3 (Week 3): Pt will complete LB dressing with mod assist 3 of 4 attempts OT Short Term Goal 3 - Progress (Week 3): Progressing toward goal OT Short Term Goal 4 (Week 3): Pt will utilize RUE as diminished level to complete 2 grooming tasks with  min cues OT Short Term Goal 4 - Progress (Week 3): Progressing toward goal Week 4:  OT Short Term Goal 1 (Week 4): STG = LTGs due to remaining LOS   Skilled Therapeutic Interventions/Progress Updates:    Treatment session with focus on functional transfers, sit > stand, and attention to Rt environment and RUE during self-care tasks of bathing and dressing.  Pt received in bed with RN provided foley care.  Completed bed mobility with min assist and then squat pivot transfer bed > w/c with mod assist.  Pt appearing more alert this session and willing to participate in bathing at shower level.  Initially requiring max assist for transfer into walk-in shower due to pushing tendencies and decreased attention to Rt, however with improved hand positioning to minimize pushing pt transferred to Rt into shower with mod assist.  Mod assist for sitting balance during bathing this session due to strong lean to Rt.  Increased ideational apraxia throughout bathing this session with attempts to use shower cord to wash body and then dry body as well as use of shower curtain to dry body.  Increased pushing with sit > stand at sink this session, attempts to utilize mirror for visual input with no success.  Required +2 for sit > stand and to complete LB dressing while therapist maintained pt in standing position while blocking at Rt knee and providing manual facilitation for partially upright standing balance.  Left upright in w/c with quick release belt donned and RN in room.  Therapy Documentation Precautions:  Precautions Precautions: Fall Precaution Comments: R side inattention, strong push to R side, posterior lean  Restrictions Weight Bearing Restrictions: No General:   Vital Signs: Therapy Vitals Temp Source: Oral Pulse Rate: 95 Resp: 18 BP: (!) 155/72 Patient Position (if appropriate): Lying Oxygen Therapy SpO2: 100 % O2 Device: Not Delivered Pain:   ADL:   Exercises:   Other Treatments:    See  Function Navigator for Current Functional Status.   Therapy/Group: Individual Therapy  Simonne Come 10/27/2016, 8:28 AM

## 2016-10-28 ENCOUNTER — Inpatient Hospital Stay (HOSPITAL_COMMUNITY): Payer: Medicare Other | Admitting: Occupational Therapy

## 2016-10-28 ENCOUNTER — Inpatient Hospital Stay (HOSPITAL_COMMUNITY): Payer: Medicare Other | Admitting: Speech Pathology

## 2016-10-28 ENCOUNTER — Inpatient Hospital Stay (HOSPITAL_COMMUNITY): Payer: Medicare Other | Admitting: Physical Therapy

## 2016-10-28 DIAGNOSIS — R4702 Dysphasia: Secondary | ICD-10-CM

## 2016-10-28 DIAGNOSIS — R4701 Aphasia: Secondary | ICD-10-CM

## 2016-10-28 LAB — GLUCOSE, CAPILLARY
GLUCOSE-CAPILLARY: 149 mg/dL — AB (ref 65–99)
GLUCOSE-CAPILLARY: 188 mg/dL — AB (ref 65–99)
Glucose-Capillary: 143 mg/dL — ABNORMAL HIGH (ref 65–99)
Glucose-Capillary: 169 mg/dL — ABNORMAL HIGH (ref 65–99)

## 2016-10-28 LAB — MAGNESIUM: MAGNESIUM: 1.7 mg/dL (ref 1.7–2.4)

## 2016-10-28 NOTE — Progress Notes (Signed)
Physical Therapy Session Note  Patient Details  Name: Douglas [name] MRN: [id number] Date of Birth: 1941-04-04  Today's Date: 10/28/2016 PT Individual Time: 1400-1430 PT Individual Time Calculation (min): 30 min    Short Term Goals: Week 1:  PT Short Term Goal 1 (Week 1): Pt will transition to sitting EOB with +1 assist PT Short Term Goal 1 - Progress (Week 1): Met PT Short Term Goal 2 (Week 1): Pt will transfer to w/c with +1 assist and LRAD PT Short Term Goal 2 - Progress (Week 1): Met PT Short Term Goal 3 (Week 1): Pt will initiate gait training with PT PT Short Term Goal 3 - Progress (Week 1): Not met  Skilled Therapeutic Interventions/Progress Updates:    Pt was seen bedside in the pm sitting at nurses station. Pt transported to rehab gym. Attempted multiple sit to stand transfers with max A in and outside of parallel bars. Pt leaning posteriorly for most attempts with minimal active participation. Pt returned to room. Pt stood x2 in room from w/c with mod A and verbal cues, however unwilling to transfer to chair or attempt to ambulate. Pt transferred w/c to edge of bed with max A. Pt transferred edge of bed to supine with mod A. Pt moved up in bed with total A. Pt quickly closed eyes and drifted off once positioned comfortably on bed. Treatment session ended, pt's nurse notified.   Therapy Documentation Precautions:  Precautions Precautions: Fall Precaution Comments: R side inattention, strong push to R side, posterior lean Restrictions Weight Bearing Restrictions: No General: PT Amount of Missed Time (min): 60 Minutes PT Missed Treatment Reason: Patient fatigue;Patient unwilling to participate Pain: No c/o pain.   See Function Navigator for Current Functional Status.   Therapy/Group: Individual Therapy  Jmichael, Gille 10/28/2016, 2:48 PM

## 2016-10-28 NOTE — Progress Notes (Signed)
+/-   sleep last night. 2 small soft, incontinent stools. Urine with sediment and blood clots. Douglas [name], Douglas [name]

## 2016-10-28 NOTE — Progress Notes (Signed)
Occupational Therapy Session Note  Patient Details  Name: Douglas [name] MRN: [id number] Date of Birth: 09/08/1941  Today's Date: 10/28/2016 OT Individual Time: 1105-1200 OT Individual Time Calculation (min): 55 min     Short Term Goals: Week 4:  OT Short Term Goal 1 (Week 4): STG = LTGs due to remaining LOS  Skilled Therapeutic Interventions/Progress Updates:    Treatment session with focus on attention to task, initiation, and participation in functional self-care tasks.  Pt received in bed, initially shaking head "no" to participation in any therapy.  Able to encourage pt to engage in dressing seated at EOB. Pt demonstrating Rt lean in sitting requiring min-mod assist to maintain sitting balance.  Assistance provided for attention to Rt side with both UB and LB dressing as well as scanning to Rt to obtain items.  Engaged in sit > stand with therapist seated in arm chair placed in front of pt to decrease pushing tendencies while increasing participation in sit > stand to complete LB dressing.  Stand pivot transfer bed > w/c on pt's Rt with Max assist, pt initiating sit > stand with Min assist however then requiring manual facilitation for weight shift to sit in w/c.  Attempted to engage in grooming tasks with pt not initiating any task.  Engaged in sorting task seated in Dayroom with max cues, with pt demonstrating decreased attention and decreased scanning to Rt visual field. Left upright in w/c with quick release belt donned and friends visiting.  Therapy Documentation Precautions:  Precautions Precautions: Fall Precaution Comments: R side inattention, strong push to R side, posterior lean Restrictions Weight Bearing Restrictions: No Pain:  Pt with no c/o pain  See Function Navigator for Current Functional Status.   Therapy/Group: Individual Therapy  Rosalio LoudHOXIE, Mariachristina Holle 10/28/2016, 12:38 PM

## 2016-10-28 NOTE — Plan of Care (Signed)
Problem: RH BOWEL ELIMINATION Goal: RH STG MANAGE BOWEL WITH ASSISTANCE STG Manage Bowel with max Assistance.   Outcome: Not Progressing Incontinent of stool, total assist with hygiene and changing patient

## 2016-10-28 NOTE — Progress Notes (Signed)
Subjective/Complaints: Pt seen laying in bed this AM.  Speech continues to be garbled and does not follow commands.  Unable to obtain ROS due to aphasia  Objective: Vital Signs: Blood pressure (!) 163/72, pulse 92, temperature 98.7 F (37.1 C), temperature source Oral, resp. rate 19, weight 71.5 kg (157 lb 9.6 oz), SpO2 95 %. No results found. Results for orders placed or performed during the hospital encounter of 10/05/16 (from the past 72 hour(s))  Occult blood card to lab, stool     Status: None   Collection Time: 10/25/16 10:51 AM  Result Value Ref Range   Fecal Occult Bld NEGATIVE NEGATIVE  Glucose, capillary     Status: Abnormal   Collection Time: 10/25/16 11:42 AM  Result Value Ref Range   Glucose-Capillary 224 (H) 65 - 99 mg/dL  Occult blood card to lab, stool     Status: None   Collection Time: 10/25/16  3:00 PM  Result Value Ref Range   Fecal Occult Bld NEGATIVE NEGATIVE  Glucose, capillary     Status: Abnormal   Collection Time: 10/25/16  4:51 PM  Result Value Ref Range   Glucose-Capillary 212 (H) 65 - 99 mg/dL  Glucose, capillary     Status: Abnormal   Collection Time: 10/25/16  9:08 PM  Result Value Ref Range   Glucose-Capillary 187 (H) 65 - 99 mg/dL  CBC     Status: Abnormal   Collection Time: 10/26/16  6:20 AM  Result Value Ref Range   WBC 8.4 4.0 - 10.5 K/uL   RBC 4.03 (L) 4.22 - 5.81 MIL/uL   Hemoglobin 11.1 (L) 13.0 - 17.0 g/dL   HCT 36.4 (L) 39.0 - 52.0 %   MCV 90.3 78.0 - 100.0 fL   MCH 27.5 26.0 - 34.0 pg   MCHC 30.5 30.0 - 36.0 g/dL   RDW 21.3 (H) 11.5 - 15.5 %   Platelets 78 (L) 150 - 400 K/uL    Comment: CONSISTENT WITH PREVIOUS RESULT  Basic metabolic panel     Status: Abnormal   Collection Time: 10/26/16  6:20 AM  Result Value Ref Range   Sodium 148 (H) 135 - 145 mmol/L   Potassium 3.1 (L) 3.5 - 5.1 mmol/L   Chloride 115 (H) 101 - 111 mmol/L   CO2 24 22 - 32 mmol/L   Glucose, Bld 111 (H) 65 - 99 mg/dL   BUN 25 (H) 6 - 20 mg/dL   Creatinine, Ser 1.26 (H) 0.61 - 1.24 mg/dL   Calcium 8.0 (L) 8.9 - 10.3 mg/dL   GFR calc non Af Amer 54 (L) >60 mL/min   GFR calc Af Amer >60 >60 mL/min    Comment: (NOTE) The eGFR has been calculated using the CKD EPI equation. This calculation has not been validated in all clinical situations. eGFR's persistently <60 mL/min signify possible Chronic Kidney Disease.    Anion gap 9 5 - 15  Glucose, capillary     Status: Abnormal   Collection Time: 10/26/16  6:45 AM  Result Value Ref Range   Glucose-Capillary 122 (H) 65 - 99 mg/dL  Glucose, capillary     Status: Abnormal   Collection Time: 10/26/16 11:34 AM  Result Value Ref Range   Glucose-Capillary 198 (H) 65 - 99 mg/dL  Glucose, capillary     Status: Abnormal   Collection Time: 10/26/16  4:43 PM  Result Value Ref Range   Glucose-Capillary 148 (H) 65 - 99 mg/dL  Glucose, capillary     Status:  Abnormal   Collection Time: 10/26/16  9:20 PM  Result Value Ref Range   Glucose-Capillary 170 (H) 65 - 99 mg/dL  Basic metabolic panel     Status: Abnormal   Collection Time: 10/27/16  6:31 AM  Result Value Ref Range   Sodium 146 (H) 135 - 145 mmol/L   Potassium 3.1 (L) 3.5 - 5.1 mmol/L   Chloride 115 (H) 101 - 111 mmol/L   CO2 24 22 - 32 mmol/L   Glucose, Bld 133 (H) 65 - 99 mg/dL   BUN 24 (H) 6 - 20 mg/dL   Creatinine, Ser 1.30 (H) 0.61 - 1.24 mg/dL   Calcium 8.1 (L) 8.9 - 10.3 mg/dL   GFR calc non Af Amer 52 (L) >60 mL/min   GFR calc Af Amer >60 >60 mL/min    Comment: (NOTE) The eGFR has been calculated using the CKD EPI equation. This calculation has not been validated in all clinical situations. eGFR's persistently <60 mL/min signify possible Chronic Kidney Disease.    Anion gap 7 5 - 15  Glucose, capillary     Status: Abnormal   Collection Time: 10/27/16  6:42 AM  Result Value Ref Range   Glucose-Capillary 122 (H) 65 - 99 mg/dL  Glucose, capillary     Status: Abnormal   Collection Time: 10/27/16 11:36 AM  Result Value  Ref Range   Glucose-Capillary 235 (H) 65 - 99 mg/dL  Glucose, capillary     Status: Abnormal   Collection Time: 10/27/16  4:41 PM  Result Value Ref Range   Glucose-Capillary 159 (H) 65 - 99 mg/dL  Glucose, capillary     Status: Abnormal   Collection Time: 10/27/16  9:01 PM  Result Value Ref Range   Glucose-Capillary 209 (H) 65 - 99 mg/dL  Magnesium     Status: None   Collection Time: 10/28/16  5:15 AM  Result Value Ref Range   Magnesium 1.7 1.7 - 2.4 mg/dL  Glucose, capillary     Status: Abnormal   Collection Time: 10/28/16  7:06 AM  Result Value Ref Range   Glucose-Capillary 149 (H) 65 - 99 mg/dL    General: NAD. Vital signs reviewed.  Psych: Unable to assess due to aphasia Heart: RRR. No JVD.  Lungs: CTA bilaterally. Unlabored.  Abdomen: Positive bowel sounds, soft  Skin: Warm and dry. Intact.  Neurologic: Alert Global aphasia ?Expressive >receptive (stable) Inconsistently following commands, moving all extremities.  Follows some simple commands Psych: Unable to assess due to aphasia  Assessment/Plan: 1. 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 Physiatrist is providing close team supervision and 24 hour management of active medical problems listed below. Physiatrist and rehab team continue to assess barriers to discharge/monitor patient progress toward functional and medical goals. FIM: Function - Bathing Bathing activity did not occur: Refused Position: Shower Body parts bathed by patient: Right arm, Left arm, Chest, Abdomen Body parts bathed by helper: Front perineal area, Buttocks, Right upper leg, Left upper leg, Right lower leg, Left lower leg, Back Bathing not applicable: Right lower leg, Left lower leg Assist Level:  (Mod assist)  Function- Upper Body Dressing/Undressing What is the patient wearing?: Pull over  shirt/dress Pull over shirt/dress - Perfomed by patient: Put head through opening, Thread/unthread left sleeve Pull over shirt/dress - Perfomed by helper: Thread/unthread right sleeve, Pull shirt over trunk Assist Level:  (Mod assist)  Set up : To obtain clothing/put away Function - Lower Body Dressing/Undressing What is the patient wearing?: Pants, Non-skid slipper socks Position: Wheelchair/chair at sink Underwear - Performed by helper: Thread/unthread right underwear leg, Thread/unthread left underwear leg, Pull underwear up/down Pants- Performed by patient: Thread/unthread left pants leg Pants- Performed by helper: Thread/unthread right pants leg, Pull pants up/down Non-skid slipper socks- Performed by patient: Don/doff left sock Non-skid slipper socks- Performed by helper: Don/doff right sock, Don/doff left sock Shoes - Performed by helper: Don/doff right shoe, Don/doff left shoe, Fasten right, Fasten left TED Hose - Performed by helper: Don/doff right TED hose, Don/doff left TED hose Assist for footwear: Maximal assist Assist for lower body dressing:  (Total assist)  Function - Toileting Toileting activity did not occur: No continent bowel/bladder event Toileting steps completed by helper: Adjust clothing prior to toileting, Performs perineal hygiene, Adjust clothing after toileting Toileting Assistive Devices: Other (comment) (holding onto sink) Assist level: Two helpers  Function - Air cabin crew transfer activity did not occur: N/A Toilet transfer assistive device: Bedside commode Assist level to toilet: 2 helpers Assist level from toilet: 2 helpers Assist level to bedside commode (at bedside): 2 helpers Assist level from bedside commode (at bedside): 2 helpers  Function - Chair/bed transfer Chair/bed transfer activity did not occur: Safety/medical concerns Chair/bed transfer method: Stand pivot Chair/bed transfer assist level: 2 helpers Chair/bed transfer  assistive device: Mechanical lift Mechanical lift: [place] Chair/bed transfer details: Manual facilitation for weight shifting, Tactile cues for posture, Verbal cues for sequencing, Verbal cues for technique, Manual facilitation for placement  Function - Locomotion: Wheelchair Will patient use wheelchair at discharge?: Yes Type: Manual Wheelchair activity did not occur: Safety/medical concerns Max wheelchair distance: 20 Assist Level: Moderate assistance (Pt 50 - 74%) Wheel 50 feet with 2 turns activity did not occur: Safety/medical concerns Assist Level: Total assistance (Pt < 25%) Wheel 150 feet activity did not occur: Safety/medical concerns Turns around,maneuvers to table,bed, and toilet,negotiates 3% grade,maneuvers on rugs and over doorsills: No Function - Locomotion: Ambulation Ambulation activity did not occur: Safety/medical concerns Assistive device: Other (comment) (grocery cart) Max distance: 120 Assist level: Maximal assist (Pt 25 - 49%) Walk 10 feet activity did not occur: Safety/medical concerns Assist level: Maximal assist (Pt 25 - 49%) Walk 50 feet with 2 turns activity did not occur:  (w/c follow for safety) Assist level: 2 helpers Walk 150 feet activity did not occur: Safety/medical concerns Walk 10 feet on uneven surfaces activity did not occur: Safety/medical concerns  Function - Comprehension Comprehension: Auditory Comprehension assist level: Understands basic 25 - 49% of the time/ requires cueing 50 - 75% of the time  Function - Expression Expression: Verbal Expression assistive device: Other (Comment) (Gestures along with 1 functional phrase) Expression assist level: Expresses basic 25 - 49% of the time/requires cueing 50 - 75% of the time. Uses single words/gestures.  Function - Social Interaction Social Interaction assist level: Interacts appropriately 25 - 49% of time - Needs frequent redirection.  Function - Problem Solving Problem solving assist  level: Solves basic 25 - 49% of the time - needs direction more than half the time to initiate, plan or complete simple activities  Function - Memory Memory assist level: Recognizes or recalls less than 25% of the time/requires cueing greater than 75% of the time Patient normally able to recall (first 3 days only): That he or she is in a hospital   Medical Problem List and Plan: 1.  Right-sided weakness, aphasia, dysphagia  secondary to left MCA infarct  Cont CIR.  2.  DVT Prophylaxis/Anticoagulation: Subcutaneous Lovenox, ASA holding.  Appreciate hematology and GI notes  Will need at least weekly CBC to monitor hemoglobin  SCDs ordered 3. Pain Management: Tylenol as needed 4. Dysphagia. Dysphagia #1 honey liquids. Monitor for any signs of aspiration.   Added daily at bedtime IV fluids 5. Neuropsych: This patient is not capable of making decisions on his own behalf. 6. Skin/Wound Care: Routine skin checks 7. Fluids/Electrolytes/Nutrition: Routine I&Os, IV fluid qhs  8. Aspiration pneumonia. resolved 9. Diabetes mellitus with peripheral neuropathy. Hemoglobin A1c 7.2.   Check blood sugars before meals and at bedtime  Lantus 25U daily  Novolog 4U with meals added 12/25, increased to 8U on 12/26, increased to [place] on 12/27, increased to 14U on 1/12  Slightly improved, will consider further increase tomorrow 10. Hypertension/tachycardia.   Monitor heart rate with increased mobility  Increased Lopressor to 50 on 1/12, will consider further increase tomorrow 11. Stage III chronic kidney disease. Baseline creatinine 1.62.   Cr. 1.30 on 1/12  Labs ordered for Monday 12. Mood. Depakote 250 mg BID added for bouts of agitation and restlessness. 13. Hypothyroidism. Synthroid 14. Hyperlipidemia. Lipitor 15. Anemia, acute blood loss. Also with pancytopenia related to infection, now improving.   GI consulted, spoke to GI with plans for EGD, family refused, spoke with GI again, cont PPI  Heme/Onc  consulted -slow GI bleed, Heme/Onc on consult,  improvement in hemoglobin status post transfusion  Hb 11.1 on 1/11  Cont to monitor  Labs ordered for Monday 16. Thrombocytopenia: Negative. Hit panel neg.  SRA negative  Plts 78 on 1/11, trending down again  Labs ordered for Monday 17. Hypoalbuminemia  Supplement started 12/23 18. Acute lower UTI-resolved No voiding trial per uro, f/u as outpt 19. HypoK  Increased To 40 mEq twice a day  Will cont to monitor  -HS IVF, PICC line  Labs ordered for Monday,   Mag 1.7 on 1/13 20. Mild hypernatremia  Na 146 on 1/12  Cont to monitor  Labs ordered for Monday  A FACE TO FACE EVALUATION WAS PERFORMED  Kimmi Acocella Lorie Phenix 10/28/2016, 10:31 AM

## 2016-10-29 ENCOUNTER — Inpatient Hospital Stay (HOSPITAL_COMMUNITY): Payer: Medicare Other | Admitting: Physical Therapy

## 2016-10-29 ENCOUNTER — Inpatient Hospital Stay (HOSPITAL_COMMUNITY): Payer: Medicare Other | Admitting: Occupational Therapy

## 2016-10-29 DIAGNOSIS — N183 Chronic kidney disease, stage 3 unspecified: Secondary | ICD-10-CM

## 2016-10-29 DIAGNOSIS — D500 Iron deficiency anemia secondary to blood loss (chronic): Secondary | ICD-10-CM

## 2016-10-29 DIAGNOSIS — R000 Tachycardia, unspecified: Secondary | ICD-10-CM

## 2016-10-29 LAB — GLUCOSE, CAPILLARY
Glucose-Capillary: 127 mg/dL — ABNORMAL HIGH (ref 65–99)
Glucose-Capillary: 129 mg/dL — ABNORMAL HIGH (ref 65–99)
Glucose-Capillary: 132 mg/dL — ABNORMAL HIGH (ref 65–99)
Glucose-Capillary: 135 mg/dL — ABNORMAL HIGH (ref 65–99)

## 2016-10-29 MED ORDER — METOPROLOL TARTRATE 50 MG PO TABS
75.0000 mg | ORAL_TABLET | Freq: Two times a day (BID) | ORAL | Status: DC
  Administered 2016-10-29 – 2016-10-31 (×4): 75 mg via ORAL
  Filled 2016-10-29 (×4): qty 1

## 2016-10-29 NOTE — Progress Notes (Signed)
Occupational Therapy Session Note  Patient Details  Name: Douglas [name] MRN: [id number] Date of Birth: 11-21-1940  Today's Date: 10/29/2016 OT Individual Time: 0800-0900 OT Individual Time Calculation (min): 60 min   Short Term Goals: Week 4:  OT Short Term Goal 1 (Week 4): STG = LTGs due to remaining LOS  Skilled Therapeutic Interventions/Progress Updates:    1:1 OT session focused on attention,  Functional use of R Ue, transfers, and modified bathing/dressing. Pt asleep in bed upon OT arrival, lethargic for beginning of OT session requiring total A for peri-care and brief change after incontinent Bm. Total A to don pants at bed level, but able to follow one-step commands for rolling w/ bed mobility.  Increased alertness after transfer to sitting EOB. Addressed dynamic sitting balance with focus on weight shifting and balance strategies.Total A squat-pivot transfer >w/c Grooming at the sink with focus on functional use of R UE. OT oriented pt's shirt for him, then he was able to thread B UE's and pull shirt overhead. Pt left seated in w.c at end of session with safety belt on and needs met.   Therapy Documentation Precautions:  Precautions Precautions: Fall Precaution Comments: R side inattention, strong push to R side, posterior lean Restrictions Weight Bearing Restrictions: No Pain: Pain Assessment Pain Assessment: Faces Faces Pain Scale: Hurts a little bit Pain Type: Chronic pain Pain Location:  (gerneralized) Pain Onset: With Activity  See Function Navigator for Current Functional Status.   Therapy/Group: Individual Therapy  Valma Cava 10/29/2016, 12:16 PM

## 2016-10-29 NOTE — Plan of Care (Signed)
Problem: RH BOWEL ELIMINATION Goal: RH STG MANAGE BOWEL WITH ASSISTANCE STG Manage Bowel with max Assistance.   Outcome: Not Progressing Total assist- incontinent of bowel

## 2016-10-29 NOTE — Progress Notes (Signed)
Subjective/Complaints: Pt seen laying in bed this AM.  He slept well overnight per nursing.    ROS: Unable to obtain due to aphasia  Objective: Vital Signs: Blood pressure (!) 153/88, pulse 100, temperature 98.6 F (37 C), temperature source Oral, resp. rate 16, weight 71.5 kg (157 lb 9.6 oz), SpO2 92 %. No results found. Results for orders placed or performed during the hospital encounter of 10/05/16 (from the past 72 hour(s))  Glucose, capillary     Status: Abnormal   Collection Time: 10/26/16 11:34 AM  Result Value Ref Range   Glucose-Capillary 198 (H) 65 - 99 mg/dL  Glucose, capillary     Status: Abnormal   Collection Time: 10/26/16  4:43 PM  Result Value Ref Range   Glucose-Capillary 148 (H) 65 - 99 mg/dL  Glucose, capillary     Status: Abnormal   Collection Time: 10/26/16  9:20 PM  Result Value Ref Range   Glucose-Capillary 170 (H) 65 - 99 mg/dL  Basic metabolic panel     Status: Abnormal   Collection Time: 10/27/16  6:31 AM  Result Value Ref Range   Sodium 146 (H) 135 - 145 mmol/L   Potassium 3.1 (L) 3.5 - 5.1 mmol/L   Chloride 115 (H) 101 - 111 mmol/L   CO2 24 22 - 32 mmol/L   Glucose, Bld 133 (H) 65 - 99 mg/dL   BUN 24 (H) 6 - 20 mg/dL   Creatinine, Ser 1.30 (H) 0.61 - 1.24 mg/dL   Calcium 8.1 (L) 8.9 - 10.3 mg/dL   GFR calc non Af Amer 52 (L) >60 mL/min   GFR calc Af Amer >60 >60 mL/min    Comment: (NOTE) The eGFR has been calculated using the CKD EPI equation. This calculation has not been validated in all clinical situations. eGFR's persistently <60 mL/min signify possible Chronic Kidney Disease.    Anion gap 7 5 - 15  Glucose, capillary     Status: Abnormal   Collection Time: 10/27/16  6:42 AM  Result Value Ref Range   Glucose-Capillary 122 (H) 65 - 99 mg/dL  Glucose, capillary     Status: Abnormal   Collection Time: 10/27/16 11:36 AM  Result Value Ref Range   Glucose-Capillary 235 (H) 65 - 99 mg/dL  Glucose, capillary     Status: Abnormal   Collection  Time: 10/27/16  4:41 PM  Result Value Ref Range   Glucose-Capillary 159 (H) 65 - 99 mg/dL  Glucose, capillary     Status: Abnormal   Collection Time: 10/27/16  9:01 PM  Result Value Ref Range   Glucose-Capillary 209 (H) 65 - 99 mg/dL  Magnesium     Status: None   Collection Time: 10/28/16  5:15 AM  Result Value Ref Range   Magnesium 1.7 1.7 - 2.4 mg/dL  Glucose, capillary     Status: Abnormal   Collection Time: 10/28/16  7:06 AM  Result Value Ref Range   Glucose-Capillary 149 (H) 65 - 99 mg/dL  Glucose, capillary     Status: Abnormal   Collection Time: 10/28/16 12:03 PM  Result Value Ref Range   Glucose-Capillary 188 (H) 65 - 99 mg/dL  Glucose, capillary     Status: Abnormal   Collection Time: 10/28/16  4:56 PM  Result Value Ref Range   Glucose-Capillary 143 (H) 65 - 99 mg/dL  Glucose, capillary     Status: Abnormal   Collection Time: 10/28/16  9:01 PM  Result Value Ref Range   Glucose-Capillary 169 (H) 65 -  99 mg/dL   Comment 1 Notify RN   Glucose, capillary     Status: Abnormal   Collection Time: 10/29/16  6:52 AM  Result Value Ref Range   Glucose-Capillary 127 (H) 65 - 99 mg/dL   Comment 1 Notify RN     General: NAD. Vital signs reviewed.  Psych: Unable to assess due to aphasia Heart: Regular rhythm. Tachycardia. No JVD.  Lungs: CTA bilaterally. Unlabored.  Abdomen: Positive bowel sounds, soft  Skin: Warm and dry. Intact.  Neurologic: Alert Global aphasia ?Expressive >receptive (unchanged) Inconsistently following commands, moving all extremities.  Follows some simple commands Psych: Unable to assess due to aphasia  Assessment/Plan: 1. 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 Physiatrist is providing close team supervision and 24 hour management of active medical problems listed below. Physiatrist and rehab team  continue to assess barriers to discharge/monitor patient progress toward functional and medical goals. FIM: Function - Bathing Bathing activity did not occur: Refused Position: Shower Body parts bathed by patient: Right arm, Left arm, Chest, Abdomen Body parts bathed by helper: Front perineal area, Buttocks, Right upper leg, Left upper leg, Right lower leg, Left lower leg, Back Bathing not applicable: Right lower leg, Left lower leg Assist Level:  (Mod assist)  Function- Upper Body Dressing/Undressing What is the patient wearing?: Pull over shirt/dress Pull over shirt/dress - Perfomed by patient: Put head through opening, Thread/unthread left sleeve Pull over shirt/dress - Perfomed by helper: Thread/unthread right sleeve, Pull shirt over trunk Assist Level:  (Mod assist) Set up : To obtain clothing/put away Function - Lower Body Dressing/Undressing What is the patient wearing?: Pants, Non-skid slipper socks Position: Wheelchair/chair at sink Underwear - Performed by helper: Thread/unthread right underwear leg, Thread/unthread left underwear leg, Pull underwear up/down Pants- Performed by patient: Thread/unthread left pants leg Pants- Performed by helper: Thread/unthread right pants leg, Pull pants up/down Non-skid slipper socks- Performed by patient: Don/doff left sock Non-skid slipper socks- Performed by helper: Don/doff right sock, Don/doff left sock Shoes - Performed by helper: Don/doff right shoe, Don/doff left shoe, Fasten right, Fasten left TED Hose - Performed by helper: Don/doff right TED hose, Don/doff left TED hose Assist for footwear: Maximal assist Assist for lower body dressing:  (Total assist)  Function - Toileting Toileting activity did not occur: No continent bowel/bladder event Toileting steps completed by helper: Adjust clothing prior to toileting, Performs perineal hygiene, Adjust clothing after toileting Toileting Assistive Devices: Other (comment) (holding onto  sink) Assist level: Two helpers  Function - Air cabin crew transfer activity did not occur: N/A Toilet transfer assistive device: Bedside commode Assist level to toilet: 2 helpers Assist level from toilet: 2 helpers Assist level to bedside commode (at bedside): 2 helpers Assist level from bedside commode (at bedside): 2 helpers  Function - Chair/bed transfer Chair/bed transfer activity did not occur: Safety/medical concerns Chair/bed transfer method: Stand pivot Chair/bed transfer assist level: Maximal assist (Pt 25 - 49%/lift and lower) Chair/bed transfer assistive device: Mechanical lift Mechanical lift: Maximove Chair/bed transfer details: Manual facilitation for weight shifting, Tactile cues for posture, Verbal cues for sequencing, Verbal cues for technique, Manual facilitation for placement  Function - Locomotion: Wheelchair Will patient use wheelchair at discharge?: Yes Type: Manual Wheelchair activity did not occur: Safety/medical concerns Max wheelchair distance: 20 Assist Level: Moderate assistance (Pt 50 - 74%) Wheel 50 feet with 2 turns activity  did not occur: Safety/medical concerns Assist Level: Total assistance (Pt < 25%) Wheel 150 feet activity did not occur: Safety/medical concerns Turns around,maneuvers to table,bed, and toilet,negotiates 3% grade,maneuvers on rugs and over doorsills: No Function - Locomotion: Ambulation Ambulation activity did not occur: Safety/medical concerns Assistive device: Other (comment) (grocery cart) Max distance: 120 Assist level: Maximal assist (Pt 25 - 49%) Walk 10 feet activity did not occur: Safety/medical concerns Assist level: Maximal assist (Pt 25 - 49%) Walk 50 feet with 2 turns activity did not occur:  (w/c follow for safety) Assist level: 2 helpers Walk 150 feet activity did not occur: Safety/medical concerns Walk 10 feet on uneven surfaces activity did not occur: Safety/medical concerns  Function -  Comprehension Comprehension: Auditory Comprehension assist level: Understands basic 25 - 49% of the time/ requires cueing 50 - 75% of the time  Function - Expression Expression: Verbal Expression assistive device: Other (Comment) (Gestures along with 1 functional phrase) Expression assist level: Expresses basic 25 - 49% of the time/requires cueing 50 - 75% of the time. Uses single words/gestures.  Function - Social Interaction Social Interaction assist level: Interacts appropriately 25 - 49% of time - Needs frequent redirection.  Function - Problem Solving Problem solving assist level: Solves basic 25 - 49% of the time - needs direction more than half the time to initiate, plan or complete simple activities  Function - Memory Memory assist level: Recognizes or recalls less than 25% of the time/requires cueing greater than 75% of the time Patient normally able to recall (first 3 days only): That he or she is in a hospital   Medical Problem List and Plan: 1.  Right-sided weakness, aphasia, dysphagia secondary to left MCA infarct  Cont CIR.  2.  DVT Prophylaxis/Anticoagulation: Subcutaneous Lovenox, ASA holding.  Appreciate hematology and GI notes  Will need at least weekly CBC to monitor hemoglobin  SCDs ordered 3. Pain Management: Tylenol as needed 4. Dysphagia. Dysphagia #1 honey liquids. Monitor for any signs of aspiration.   Added daily at bedtime IV fluids 5. Neuropsych: This patient is not capable of making decisions on his own behalf. 6. Skin/Wound Care: Routine skin checks 7. Fluids/Electrolytes/Nutrition: Routine I&Os, IV fluid qhs  8. Aspiration pneumonia. resolved 9. Diabetes mellitus with peripheral neuropathy. Hemoglobin A1c 7.2.   Check blood sugars before meals and at bedtime  Lantus 25U daily  Novolog 4U with meals added 12/25, increased to 8U on 12/26, increased to [place] on 12/27, increased to 14U on 1/12  Appears to be gradually improving 10.  Hypertension/tachycardia.   Monitor heart rate with increased mobility  Increased Lopressor to 50 on 1/12, increased to 75 on 1/14 11. Stage III chronic kidney disease. Baseline creatinine 1.62.   Cr. 1.30 on 1/12  Labs ordered for Monday 12. Mood. Depakote 250 mg BID added for bouts of agitation and restlessness. 13. Hypothyroidism. Synthroid 14. Hyperlipidemia. Lipitor 15. Anemia, acute blood loss. Also with pancytopenia related to infection, now improving.   GI consulted, spoke to GI with plans for EGD, family refused, spoke with GI again, cont PPI  Heme/Onc consulted -slow GI bleed, Heme/Onc on consult,  improvement in hemoglobin status post transfusion  Hb 11.1 on 1/11  Cont to monitor  Labs ordered for Monday 16. Thrombocytopenia: Negative. Hit panel neg.  SRA negative  Plts 78 on 1/11, trending down again  Labs ordered for Monday 17. Hypoalbuminemia  Supplement started 12/23 18. Acute lower UTI-resolved No voiding trial per uro, f/u as outpt 19. HypoK  Increased To 40 mEq twice a day  Will cont to monitor  -HS IVF, PICC line  Labs ordered for Monday,   Mag 1.7 on 1/13 20. Mild hypernatremia  Na 146 on 1/12  Cont to monitor  Labs ordered for Monday  A FACE TO FACE EVALUATION WAS PERFORMED  Kelso Bibby Lorie Phenix 10/29/2016, 8:13 AM

## 2016-10-29 NOTE — Progress Notes (Signed)
Physical Therapy Note  Patient Details  Name: Douglas [name] MRN: [id number] Date of Birth: 11/28/1940 Today's Date: 10/29/2016    Time: 1130-1155 25 minutes  1:1 Pt with no signs/symptoms of pain.  Pt initially resistant to getting up with PT, pushing therapist away during attempts to get out of bed.  With encouragement pt willing to get out of bed with max A for supine to sit and squat pivot transfer. In w/c pt able to wash face with only verbal cues to initiate task.  Pt able to comb hair with hand over hand assist, improved initiation today.   Time 2: 1300-1350 50 minutes  1:1 No signs/symptoms of pain.  Pt in w/c on PT arrival. Squat pivot transfer to mat with +2 assist, pt with increased pushing during this transfer. Seated balance edge of mat with reaching and ball toss task with pt continuing with posterior tilt in pelvis and posterior lean but improved with manual facilitation and repetition.  Attempt standing x 4 attempts during session. Pt with increased pushing and continued to try to sit despite max cues to stay standing. Decreased standing tolerance today.  Pt back to bed with +2 assist.  Rolling in bed for changing brief with manual and verbal cues, max A.  Pt asleep after brief change, missed final 10 minutes of session.   DONAWERTH,KAREN 10/29/2016, 12:37 PM

## 2016-10-29 NOTE — Progress Notes (Signed)
Physical Therapy Weekly Progress Note  Patient Details  Name: Douglas [name] MRN: [id number] Date of Birth: 1941/05/17  Beginning of progress report period: October 21, 2016 End of progress report period: October 29, 2016  Patient has met 0 of 3 short term goals.  Pt continues with fluctuations in functional performance requiring mod to total +2 A for all functional mobility.  Pt unable to consistently perform at a mod A level to meet goals, currently +2 or total A to participate and perform functional tasks.  Goals downgraded due to lack of progress  Patient continues to demonstrate the following deficits muscle weakness, impaired timing and sequencing, abnormal tone, unbalanced muscle activation, decreased coordination and decreased motor planning, decreased initiation, decreased attention, decreased awareness, decreased problem solving, decreased safety awareness, decreased memory and delayed processing and decreased sitting balance, decreased standing balance, decreased postural control, hemiplegia and decreased balance strategies and therefore will continue to benefit from skilled PT intervention to increase functional independence with mobility.  Patient not progressing toward long term goals.  See goal revision..  Continue plan of care.  PT Short Term Goals Week 3:  PT Short Term Goal 1 (Week 3): Pt will ambulate x100' with LRAD and mod assist.  PT Short Term Goal 1 - Progress (Week 3): Not progressing PT Short Term Goal 2 (Week 3): Pt will self propel w/c 25' with min assist.  PT Short Term Goal 2 - Progress (Week 3): Not progressing PT Short Term Goal 3 (Week 3): Pt will maintain static standing balance for 3 minutes during a functional task with min assist  PT Short Term Goal 3 - Progress (Week 3): Not progressing  Skilled Therapeutic Interventions/Progress Updates:  Ambulation/gait training;Discharge planning;Functional mobility training;Psychosocial support;Therapeutic  Activities;Visual/perceptual remediation/compensation;Wheelchair propulsion/positioning;Therapeutic Exercise;Neuromuscular re-education;Balance/vestibular training;Skin care/wound management;Cognitive remediation/compensation;DME/adaptive equipment instruction;UE/LE Strength taining/ROM;Splinting/orthotics;Functional electrical stimulation;Patient/family education;Stair training;UE/LE Coordination activities     See Function Navigator for Current Functional Status.  Harli Engelken 10/29/2016, 7:57 AM

## 2016-10-30 ENCOUNTER — Inpatient Hospital Stay (HOSPITAL_COMMUNITY): Payer: Medicare Other | Admitting: Occupational Therapy

## 2016-10-30 ENCOUNTER — Inpatient Hospital Stay (HOSPITAL_COMMUNITY): Payer: Medicare Other | Admitting: Speech Pathology

## 2016-10-30 ENCOUNTER — Inpatient Hospital Stay (HOSPITAL_COMMUNITY): Payer: Medicare Other | Admitting: Physical Therapy

## 2016-10-30 LAB — CBC WITH DIFFERENTIAL/PLATELET
BASOS ABS: 0.1 10*3/uL (ref 0.0–0.1)
Basophils Relative: 1 %
EOS ABS: 0.5 10*3/uL (ref 0.0–0.7)
EOS PCT: 6 %
HCT: 34.6 % — ABNORMAL LOW (ref 39.0–52.0)
Hemoglobin: 10.6 g/dL — ABNORMAL LOW (ref 13.0–17.0)
LYMPHS PCT: 19 %
Lymphs Abs: 1.6 10*3/uL (ref 0.7–4.0)
MCH: 27.7 pg (ref 26.0–34.0)
MCHC: 30.6 g/dL (ref 30.0–36.0)
MCV: 90.6 fL (ref 78.0–100.0)
Monocytes Absolute: 0.8 10*3/uL (ref 0.1–1.0)
Monocytes Relative: 9 %
Neutro Abs: 5.5 10*3/uL (ref 1.7–7.7)
Neutrophils Relative %: 65 %
PLATELETS: 87 10*3/uL — AB (ref 150–400)
RBC: 3.82 MIL/uL — AB (ref 4.22–5.81)
RDW: 20.9 % — ABNORMAL HIGH (ref 11.5–15.5)
WBC: 8.6 10*3/uL (ref 4.0–10.5)

## 2016-10-30 LAB — URINALYSIS, ROUTINE W REFLEX MICROSCOPIC
BILIRUBIN URINE: NEGATIVE
Glucose, UA: 100 mg/dL — AB
Ketones, ur: NEGATIVE mg/dL
Leukocytes, UA: NEGATIVE
Nitrite: NEGATIVE
PROTEIN: 30 mg/dL — AB
Specific Gravity, Urine: 1.02 (ref 1.005–1.030)
pH: 6.5 (ref 5.0–8.0)

## 2016-10-30 LAB — URINALYSIS, MICROSCOPIC (REFLEX): WBC, UA: NONE SEEN WBC/hpf (ref 0–5)

## 2016-10-30 LAB — BASIC METABOLIC PANEL
Anion gap: 9 (ref 5–15)
BUN: 25 mg/dL — ABNORMAL HIGH (ref 6–20)
CO2: 23 mmol/L (ref 22–32)
CREATININE: 1.11 mg/dL (ref 0.61–1.24)
Calcium: 8.3 mg/dL — ABNORMAL LOW (ref 8.9–10.3)
Chloride: 112 mmol/L — ABNORMAL HIGH (ref 101–111)
Glucose, Bld: 141 mg/dL — ABNORMAL HIGH (ref 65–99)
POTASSIUM: 3.6 mmol/L (ref 3.5–5.1)
SODIUM: 144 mmol/L (ref 135–145)

## 2016-10-30 LAB — GLUCOSE, CAPILLARY
GLUCOSE-CAPILLARY: 134 mg/dL — AB (ref 65–99)
GLUCOSE-CAPILLARY: 189 mg/dL — AB (ref 65–99)
GLUCOSE-CAPILLARY: 176 mg/dL — AB (ref 65–99)
Glucose-Capillary: 150 mg/dL — ABNORMAL HIGH (ref 65–99)

## 2016-10-30 NOTE — Progress Notes (Signed)
Speech Language Pathology Daily Session Note  Patient Details  Name: Douglas [name] MRN: [id number] Date of Birth: 06/15/1941  Today's Date: 10/30/2016 SLP Individual Time: 1130-1200 SLP Individual Time Calculation (min): 30 min   Short Term Goals: Week 4: SLP Short Term Goal 1 (Week 4): Pt will answer yes/no questions for >80% accuracy with mod assist verbal cues.   SLP Short Term Goal 2 (Week 4): Pt will follow 1 step commands for 75% accuracy with mod assist verbal cues.   SLP Short Term Goal 3 (Week 4): Pt will consume dys 1 textures and honey thick liquids via teaspoon with mod assist for use of swallowing precautions and minimal overt s/s of aspiration.   SLP Short Term Goal 4 (Week 4): Pt will verbalize at the word level during structured tasks for 50% accuracy with max assist multimodal cues. SLP Short Term Goal 5 (Week 4): Pt will utilize multimodal means of communication to convey needs and wants to caregivers with max assist cues.    Skilled Therapeutic Interventions:   Skilled treatment session focused on addressing dysphagia goals. Upon SLP arrival family present to visit patient.  SLP facilitated session by providing set-up and demonstration of how to assist patient in self-feeding, which family was able to return demonstrate.  Following 3 bites of Dys.1 texture patient refused further PO from SLP; however, was accepting of assist from family.  Patient consumed Dys.1 textures and honey-thick liquids via cup with Max assist verbal and tactile cues for portion control with no overt s/s of aspiration.  While patient's function and participation fluctuate he may benefit from a repeat objective assessment this week in hopes of liberalizing liquid consistencies.  Continue with current plan of care for now.    Function:  Eating Eating   Modified Consistency Diet: Yes Eating Assist Level: Set up assist for;Helper checks for pocketed food;Helper scoops food on utensil;Helper brings  food to mouth;Help with picking up utensils;Help managing cup/glass;Supervision or verbal cues   Eating Set Up Assist For: Opening containers Helper Scoops Food on Utensil: Every scoop Helper Brings Food to Mouth: Occasionally   Cognition Comprehension Comprehension assist level: Understands basic 25 - 49% of the time/ requires cueing 50 - 75% of the time  Expression   Expression assist level: Expresses basic 25 - 49% of the time/requires cueing 50 - 75% of the time. Uses single words/gestures.  Social Interaction Social Interaction assist level: Interacts appropriately 25 - 49% of time - Needs frequent redirection.  Problem Solving Problem solving assist level: Solves basic 25 - 49% of the time - needs direction more than half the time to initiate, plan or complete simple activities  Memory Memory assist level: Recognizes or recalls less than 25% of the time/requires cueing greater than 75% of the time    Pain Pain Assessment Pain Assessment: No/denies pain Faces Pain Scale: No hurt  Therapy/Group: Individual Therapy  Charlane FerrettiMelissa Cadin Luka, M.A., CCC-SLP [phone]  [name] 10/30/2016, 12:49 PM

## 2016-10-30 NOTE — NC FL2 (Signed)
[place] MEDICAID FL2 LEVEL OF CARE SCREENING TOOL     IDENTIFICATION  Patient Name: Douglas [name] Birthdate: 04/14/1941 Sex: male Admission Date (Current Location): 10/05/2016  [place] [facility] [place]   Facility and Address:  [facility], [address], [place], [place] [address]      Provider Number: [id number]  Attending Physician Name and Address:  Erick ColaceAndrew E Kirsteins, MD  Relative Name and Phone Number:  DeBary Bingancy Pierce-niece [phone]    Current Level of Care: Other (Comment) (rehab) Recommended Level of Care: Skilled Nursing Facility Prior Approval Number:    Date Approved/Denied:   PASRR Number: [id number]  Discharge Plan: SNF    Current Diagnoses: Patient Active Problem List   Diagnosis Date Noted  . Stage 3 chronic kidney disease   . Global aphasia   . Hypernatremia   . Hypokalemia   . HCAP (healthcare-associated pneumonia)   . Sepsis (HCC)   . AKI (acute kidney injury) (HCC)   . Erythema   . Acute lower UTI   . Dysphagia due to recent cerebral infarction   . Hypoalbuminemia due to protein-calorie malnutrition (HCC)   . Thrombocytopenia (HCC)   . Leukocytosis   . Acute blood loss anemia   . Type 2 diabetes mellitus with peripheral neuropathy (HCC)   . Labile blood glucose   . 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 Right hemiparesis (HCC)   . Urinary retention   . Acute delirium   . CKD (chronic kidney disease) 09/27/2016  . Elevated troponin 09/27/2016  . Anemia 09/27/2016  . Acute embolic stroke (HCC)   . Acute systolic heart failure (HCC)   . Benign essential HTN   . Tachycardia   . CVA (cerebral vascular accident) (HCC) 09/26/2016  . Hyperlipidemia 11/30/2013  . Essential hypertension 11/30/2013  . DM (diabetes mellitus) (HCC) 11/30/2013    Orientation RESPIRATION BLADDER Height & Weight     Self, Place  Normal Indwelling catheter Weight: 157 lb 9.6 oz (71.5 kg) Height:      BEHAVIORAL SYMPTOMS/MOOD NEUROLOGICAL BOWEL NUTRITION STATUS      Incontinent Diet, Supplemental (Dys 1 honey thick liquids per teaspoon)  AMBULATORY STATUS COMMUNICATION OF NEEDS Skin   Extensive Assist Non-Verbally                         Personal Care Assistance Level of Assistance  Bathing, Feeding, Dressing Bathing Assistance: Limited assistance Feeding assistance: Limited assistance Dressing Assistance: Limited assistance     Functional Limitations Info  Sight, Speech, Hearing Sight Info: Adequate Hearing Info: Adequate Speech Info: Impaired    SPECIAL CARE FACTORS FREQUENCY  PT (By licensed PT), OT (By licensed OT), Bowel and bladder program, Speech therapy     PT Frequency: 5x week OT Frequency: 5x week Bowel and Bladder Program Frequency: timed toileting for bowel   Speech Therapy Frequency: 5x week      Contractures Contractures Info: Not present    Additional Factors Info  Code Status, Allergies Code Status Info:  (DNR) Allergies Info: NKDA           Current Medications (10/30/2016):  This is the current hospital active medication list Current Facility-Administered Medications  Medication Dose Route Frequency Provider Last Rate Last Dose  . 0.45 % sodium chloride infusion   Intravenous Continuous Mcarthur RossettiDaniel J Angiulli, PA-C 75 mL/hr at 10/28/16 1802    . acetaminophen (TYLENOL) tablet 650 mg  650 mg Oral  Q4H PRN Mcarthur Rossetti Angiulli, PA-C   650 mg at 10/30/16 0140  . acetaminophen (TYLENOL) tablet 650 mg  650 mg Oral Once Pamela S Love, PA-C      . atorvastatin (LIPITOR) tablet 80 mg  80 mg Oral q1800 Mcarthur Rossetti Angiulli, PA-C   80 mg at 10/29/16 2304  . citalopram (CELEXA) tablet 10 mg  10 mg Oral Daily Erick Colace, MD   10 mg at 10/30/16 0751  . diphenhydrAMINE (BENADRYL) capsule 25 mg  25 mg Oral Once Pamela S Love, PA-C      . feeding supplement (PRO-STAT SUGAR FREE 64) liquid 30 mL  30 mL Oral BID Ankit Karis Juba, MD   30 mL at 10/30/16 0751  .  folic acid (FOLVITE) tablet 1 mg  1 mg Oral Daily Artis Delay, MD   1 mg at 10/30/16 0752  . insulin aspart (novoLOG) injection 0-15 Units  0-15 Units Subcutaneous TID WC Mcarthur Rossetti Angiulli, PA-C   2 Units at 10/30/16 1610  . insulin aspart (novoLOG) injection 14 Units  14 Units Subcutaneous TID WC Ankit Karis Juba, MD   14 Units at 10/28/16 [phone]  . insulin glargine (LANTUS) injection 25 Units  25 Units Subcutaneous Daily Erick Colace, MD   25 Units at 10/30/16 [phone]  . levothyroxine (SYNTHROID, LEVOTHROID) tablet 75 mcg  75 mcg Oral QAC breakfast Mcarthur Rossetti Angiulli, PA-C   75 mcg at 10/30/16 8119  . MEDLINE mouth rinse  15 mL Mouth Rinse BID Erick Colace, MD   15 mL at 10/30/16 0753  . metoprolol tartrate (LOPRESSOR) tablet 75 mg  75 mg Oral BID Ankit Karis Juba, MD   75 mg at 10/30/16 0751  . ondansetron (ZOFRAN) tablet 4 mg  4 mg Oral Q6H PRN Mcarthur Rossetti Angiulli, PA-C       Or  . ondansetron [facility]) injection 4 mg  4 mg Intravenous Q6H PRN Mcarthur Rossetti Angiulli, PA-C   4 mg at 10/20/16 1478  . pantoprazole sodium (PROTONIX) 40 mg/20 mL oral suspension 40 mg  40 mg Per Tube BID Gerhard Munch Hammons, RPH   40 mg at 10/29/16 2151  . potassium chloride 20 MEQ/15ML (10%) solution 40 mEq  40 mEq Oral BID Erick Colace, MD   40 mEq at 10/30/16 0755  . RESOURCE THICKENUP CLEAR   Oral PRN Mcarthur Rossetti Angiulli, PA-C      . sodium chloride flush (NS) 0.9 % injection 10-40 mL  10-40 mL Intracatheter PRN Erick Colace, MD   10 mL at 10/30/16 2956  . sorbitol 70 % solution 30 mL  30 mL Oral Daily PRN Mcarthur Rossetti Angiulli, PA-C      . Valproate Sodium (DEPAKENE) solution 250 mg  250 mg Oral BID Mcarthur Rossetti Angiulli, PA-C   250 mg at 10/30/16 2130     Discharge Medications: Please see discharge summary for a list of discharge medications.  Relevant Imaging Results:  Relevant Lab Results:   Additional Information SSN: [id number]  has a PICC line for hydration fluids at night  Kadarrius Yanke, Lemar Livings,  LCSW

## 2016-10-30 NOTE — Progress Notes (Signed)
Occupational Therapy Session Note  Patient Details  Name: Douglas [name] MRN: [id number] Date of Birth: 05/23/1941  Today's Date: 10/30/2016 OT Individual Time: 0800-0900 and 1330-1400 OT Individual Time Calculation (min): 60 min and 30 min     Short Term Goals: Week 4:  OT Short Term Goal 1 (Week 4): STG = LTGs due to remaining LOS  Skilled Therapeutic Interventions/Progress Updates:   Session 1: Upon entering the room, pt supine in bed with mod A for supine >sit. Dressing performed while supine on EOB. Pt required min - max A for sitting balance. Pt needing max multimodal cues for dressing tasks. Pt with posterior lean when attempting to don LB clothing items and follows 1 step verbal commands this session with 50% accuracy. Stand pivot transfer with total A to the R. Stedy utilized for safety and better position as pt was pushing once partially seated in wheelchair. Breakfast tray set up with OT scooping food onto utensil, placing in pt's L hand, and performing hand over hand to get food to mouth. Quick release belt donned and RN present to assist with oral care after meal.    Session 2: Pt received at RN station and pt shaking head "no" at therapist. Pt fatigued and having difficulty remaining awake and was returned to room via wheelchair. Pt transferred via STEDY and +2 for safety as pt was pushing strongly to the R. Pt rolled with assist of 2 L <> R as pt would not open eyes but had clearly had BM accident. Pt requiring total A for hygiene and clothing management. Pt declined further OT intervention at this time. Call bell within reach and bed alarm activated.      Therapy Documentation Precautions:  Precautions Precautions: Fall Precaution Comments: R side inattention, strong push to R side, posterior lean Restrictions Weight Bearing Restrictions: No General:   Vital Signs: Therapy Vitals Temp: 97.5 F (36.4 C) Temp Source: Oral Pulse Rate: 94 Resp: 18 BP: (!)  143/64 Patient Position (if appropriate): Lying Oxygen Therapy SpO2: 98 % O2 Device: Not Delivered  See Function Navigator for Current Functional Status.   Therapy/Group: Individual Therapy  Alen BleacherBradsher, Adelina Collard P 10/30/2016, 5:33 PM

## 2016-10-30 NOTE — Progress Notes (Signed)
Subjective/Complaints:  Patient remains severely aphasic. He is awake and alert and using standing table with PT with a buttock sling  ROS: Unable to obtain due to aphasia  Objective: Vital Signs: Blood pressure (!) 135/56, pulse 91, temperature 97.6 F (36.4 C), temperature source Oral, resp. rate 18, weight 71.5 kg (157 lb 9.6 oz), SpO2 94 %. No results found. Results for orders placed or performed during the hospital encounter of 10/05/16 (from the past 72 hour(s))  Glucose, capillary     Status: Abnormal   Collection Time: 10/27/16 11:36 AM  Result Value Ref Range   Glucose-Capillary 235 (H) 65 - 99 mg/dL  Glucose, capillary     Status: Abnormal   Collection Time: 10/27/16  4:41 PM  Result Value Ref Range   Glucose-Capillary 159 (H) 65 - 99 mg/dL  Glucose, capillary     Status: Abnormal   Collection Time: 10/27/16  9:01 PM  Result Value Ref Range   Glucose-Capillary 209 (H) 65 - 99 mg/dL  Magnesium     Status: None   Collection Time: 10/28/16  5:15 AM  Result Value Ref Range   Magnesium 1.7 1.7 - 2.4 mg/dL  Glucose, capillary     Status: Abnormal   Collection Time: 10/28/16  7:06 AM  Result Value Ref Range   Glucose-Capillary 149 (H) 65 - 99 mg/dL  Glucose, capillary     Status: Abnormal   Collection Time: 10/28/16 12:03 PM  Result Value Ref Range   Glucose-Capillary 188 (H) 65 - 99 mg/dL  Glucose, capillary     Status: Abnormal   Collection Time: 10/28/16  4:56 PM  Result Value Ref Range   Glucose-Capillary 143 (H) 65 - 99 mg/dL  Glucose, capillary     Status: Abnormal   Collection Time: 10/28/16  9:01 PM  Result Value Ref Range   Glucose-Capillary 169 (H) 65 - 99 mg/dL   Comment 1 Notify RN   Glucose, capillary     Status: Abnormal   Collection Time: 10/29/16  6:52 AM  Result Value Ref Range   Glucose-Capillary 127 (H) 65 - 99 mg/dL   Comment 1 Notify RN   Glucose, capillary     Status: Abnormal   Collection Time: 10/29/16 11:27 AM  Result Value Ref Range    Glucose-Capillary 129 (H) 65 - 99 mg/dL   Comment 1 Notify RN   Glucose, capillary     Status: Abnormal   Collection Time: 10/29/16  5:12 PM  Result Value Ref Range   Glucose-Capillary 132 (H) 65 - 99 mg/dL   Comment 1 Notify RN   Glucose, capillary     Status: Abnormal   Collection Time: 10/29/16  9:28 PM  Result Value Ref Range   Glucose-Capillary 135 (H) 65 - 99 mg/dL   Comment 1 Notify RN   Glucose, capillary     Status: Abnormal   Collection Time: 10/30/16  6:52 AM  Result Value Ref Range   Glucose-Capillary 134 (H) 65 - 99 mg/dL   Comment 1 Notify RN   Basic metabolic panel     Status: Abnormal   Collection Time: 10/30/16  6:58 AM  Result Value Ref Range   Sodium 144 135 - 145 mmol/L   Potassium 3.6 3.5 - 5.1 mmol/L   Chloride 112 (H) 101 - 111 mmol/L   CO2 23 22 - 32 mmol/L   Glucose, Bld 141 (H) 65 - 99 mg/dL   BUN 25 (H) 6 - 20 mg/dL   Creatinine, Ser  1.11 0.61 - 1.24 mg/dL   Calcium 8.3 (L) 8.9 - 10.3 mg/dL   GFR calc non Af Amer >60 >60 mL/min   GFR calc Af Amer >60 >60 mL/min    Comment: (NOTE) The eGFR has been calculated using the CKD EPI equation. This calculation has not been validated in all clinical situations. eGFR's persistently <60 mL/min signify possible Chronic Kidney Disease.    Anion gap 9 5 - 15  CBC with Differential/Platelet     Status: Abnormal   Collection Time: 10/30/16  6:58 AM  Result Value Ref Range   WBC 8.6 4.0 - 10.5 K/uL   RBC 3.82 (L) 4.22 - 5.81 MIL/uL   Hemoglobin 10.6 (L) 13.0 - 17.0 g/dL   HCT 34.6 (L) 39.0 - 52.0 %   MCV 90.6 78.0 - 100.0 fL   MCH 27.7 26.0 - 34.0 pg   MCHC 30.6 30.0 - 36.0 g/dL   RDW 20.9 (H) 11.5 - 15.5 %   Platelets 87 (L) 150 - 400 K/uL    Comment: CONSISTENT WITH PREVIOUS RESULT   Neutrophils Relative % 65 %   Neutro Abs 5.5 1.7 - 7.7 K/uL   Lymphocytes Relative 19 %   Lymphs Abs 1.6 0.7 - 4.0 K/uL   Monocytes Relative 9 %   Monocytes Absolute 0.8 0.1 - 1.0 K/uL   Eosinophils Relative 6 %    Eosinophils Absolute 0.5 0.0 - 0.7 K/uL   Basophils Relative 1 %   Basophils Absolute 0.1 0.0 - 0.1 K/uL    General: NAD. Vital signs reviewed.  Psych: Unable to assess due to aphasia Heart: Regular rhythm. Tachycardia. No JVD.  Lungs: CTA bilaterally. Unlabored.  Abdomen: Positive bowel sounds, soft  Skin: Warm and dry. Intact.  Neurologic: Alert Global aphasia ?Expressive >receptive (unchanged) Inconsistently following commands, moving all extremities.  Follows some simple commands Psych: Unable to assess due to aphasia  Assessment/Plan: 1. 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 Physiatrist is providing close team supervision and 24 hour management of active medical problems listed below. Physiatrist and rehab team continue to assess barriers to discharge/monitor patient progress toward functional and medical goals. FIM: Function - Bathing Bathing activity did not occur: Refused Position: Wheelchair/chair at sink Body parts bathed by patient: Right arm, Left arm, Chest, Abdomen Body parts bathed by helper: Front perineal area, Right upper leg, Buttocks, Left upper leg, Right lower leg, Left lower leg Bathing not applicable: Right lower leg, Left lower leg Assist Level:  (Mod A)  Function- Upper Body Dressing/Undressing What is the patient wearing?: Pull over shirt/dress Pull over shirt/dress - Perfomed by patient: Put head through opening, Thread/unthread left sleeve, Thread/unthread right sleeve Pull over shirt/dress - Perfomed by helper: Pull shirt over trunk Assist Level: Touching or steadying assistance(Pt > 75%) Set up : To obtain clothing/put away Function - Lower Body Dressing/Undressing What is the patient wearing?: Pants, Non-skid slipper socks, Ted Hose Position: Bed Underwear - Performed by helper: Thread/unthread  right underwear leg, Thread/unthread left underwear leg, Pull underwear up/down Pants- Performed by patient: Thread/unthread left pants leg Pants- Performed by helper: Thread/unthread left pants leg, Thread/unthread right pants leg, Pull pants up/down, Fasten/unfasten pants Non-skid slipper socks- Performed by patient: Don/doff left sock Non-skid slipper socks- Performed by helper: Don/doff right sock, Don/doff left sock Shoes - Performed by helper: Don/doff right shoe, Don/doff left shoe, Fasten  right, Fasten left TED Hose - Performed by helper: Don/doff left TED hose, Don/doff right TED hose Assist for footwear: Dependant Assist for lower body dressing:  (Total A)  Function - Toileting Toileting activity did not occur: No continent bowel/bladder event Toileting steps completed by helper: Adjust clothing prior to toileting, Performs perineal hygiene, Adjust clothing after toileting Toileting Assistive Devices: Other (comment) (holding onto sink) Assist level: Two helpers  Function - Air cabin crew transfer activity did not occur: N/A Toilet transfer assistive device: Bedside commode Assist level to toilet: 2 helpers Assist level from toilet: 2 helpers Assist level to bedside commode (at bedside): 2 helpers Assist level from bedside commode (at bedside): 2 helpers  Function - Chair/bed transfer Chair/bed transfer activity did not occur: Safety/medical concerns Chair/bed transfer method: Stand pivot Chair/bed transfer assist level: Maximal assist (Pt 25 - 49%/lift and lower) Chair/bed transfer assistive device: Mechanical lift Mechanical lift: Maximove Chair/bed transfer details: Manual facilitation for weight shifting, Tactile cues for posture, Verbal cues for sequencing, Verbal cues for technique, Manual facilitation for placement  Function - Locomotion: Wheelchair Will patient use wheelchair at discharge?: Yes Type: Manual Wheelchair activity did not occur:  Safety/medical concerns Max wheelchair distance: 20 Assist Level: Moderate assistance (Pt 50 - 74%) Wheel 50 feet with 2 turns activity did not occur: Safety/medical concerns Assist Level: Total assistance (Pt < 25%) Wheel 150 feet activity did not occur: Safety/medical concerns Turns around,maneuvers to table,bed, and toilet,negotiates 3% grade,maneuvers on rugs and over doorsills: No Function - Locomotion: Ambulation Ambulation activity did not occur: Safety/medical concerns Assistive device: Other (comment) (grocery cart) Max distance: 120 Assist level: Maximal assist (Pt 25 - 49%) Walk 10 feet activity did not occur: Safety/medical concerns Assist level: Maximal assist (Pt 25 - 49%) Walk 50 feet with 2 turns activity did not occur:  (w/c follow for safety) Assist level: 2 helpers Walk 150 feet activity did not occur: Safety/medical concerns Walk 10 feet on uneven surfaces activity did not occur: Safety/medical concerns  Function - Comprehension Comprehension: Auditory Comprehension assist level: Understands basic 25 - 49% of the time/ requires cueing 50 - 75% of the time  Function - Expression Expression: Verbal Expression assistive device: Other (Comment) (gestures) Expression assist level: Expresses basic 25 - 49% of the time/requires cueing 50 - 75% of the time. Uses single words/gestures.  Function - Social Interaction Social Interaction assist level: Interacts appropriately 25 - 49% of time - Needs frequent redirection.  Function - Problem Solving Problem solving assist level: Solves basic 25 - 49% of the time - needs direction more than half the time to initiate, plan or complete simple activities  Function - Memory Memory assist level: Recognizes or recalls less than 25% of the time/requires cueing greater than 75% of the time Patient normally able to recall (first 3 days only): That he or she is in a hospital   Medical Problem List and Plan: 1.  Right-sided  weakness, aphasia, dysphagia secondary to left MCA infarct  Cont CIR. OT PT speech, reduced burden of care for skilled nursing placement 2.  DVT Prophylaxis/Anticoagulation: Subcutaneous Lovenox, ASA holding.  Appreciate hematology and GI notes  Will need at least weekly CBC to monitor hemoglobin  SCDs ordered 3. Pain Management: Tylenol as needed 4. Dysphagia. Dysphagia #1 honey liquids. Monitor for any signs of aspiration.   Added daily at bedtime IV fluids 5. Neuropsych: This patient is not capable of making decisions on his own behalf. 6. Skin/Wound Care: Routine skin checks 7.  Fluids/Electrolytes/Nutrition: Routine I&Os, IV fluid qhs  8. Aspiration pneumonia. resolved 9. Diabetes mellitus with peripheral neuropathy. Hemoglobin A1c 7.2.   Check blood sugars before meals and at bedtime  Lantus 25U daily  Novolog 4U with meals added 12/25, increased to 8U on 12/26, increased to [place] on 12/27, increased to 14U on 1/12  Appears to be gradually improving 10. Hypertension/tachycardia.   Monitor heart rate with increased mobility  Increased Lopressor to 50 on 1/12, increased to 75 on 1/14 11. Stage III chronic kidney disease. Baseline creatinine 1.62.   Cr. Improved   BMP Latest Ref Rng & Units 10/30/2016 10/27/2016 10/26/2016  Glucose 65 - 99 mg/dL 141(H) 133(H) 111(H)  BUN 6 - 20 mg/dL 25(H) 24(H) 25(H)  Creatinine 0.61 - 1.24 mg/dL 1.11 1.30(H) 1.26(H)  Sodium 135 - 145 mmol/L 144 146(H) 148(H)  Potassium 3.5 - 5.1 mmol/L 3.6 3.1(L) 3.1(L)  Chloride 101 - 111 mmol/L 112(H) 115(H) 115(H)  CO2 22 - 32 mmol/L '23 24 24  '$ Calcium 8.9 - 10.3 mg/dL 8.3(L) 8.1(L) 8.0(L)   12. Mood. Depakote 250 mg BID added for bouts of agitation and restlessness. 13. Hypothyroidism. Synthroid 14. Hyperlipidemia. Lipitor 15. Anemia, acute blood loss. Also with pancytopenia related to infection, now improving.   GI consulted, spoke to GI with plans for EGD, family refused, spoke with GI again, cont  PPI  Heme/Onc consulted -slow GI bleed, Heme/Onc on consult,  improvement in hemoglobin status post transfusion  Hb 11.1 on 1/11, Mildly reduced to 10.6 on 1:15 would continue to monitor on a weekly basis   16. Thrombocytopenia: Negative. Hit panel neg.  SRA negative  Plts 78 on 1/11, stable at 87 on 1/15  Labs ordered for Monday 17. Hypoalbuminemia  Supplement started 12/23 18. Acute lower UTI-resolved No voiding trial per uro, f/u as outpt 19. HypoK  Increased To 40 mEq twice a day, continue current dose  Will cont to monitor  -HS IVF, PICC line  Labs ordered for Monday,   Mag 1.7 on 1/13 20. Mild hypernatremia, resolved  Na 144 on 1/15  . Recommend weekly labs at skilled nursing facility  Bentley E 10/30/2016, 10:35 AM

## 2016-10-30 NOTE — Progress Notes (Signed)
Physical Therapy Session Note  Patient Details  Name: Douglas [name] MRN: [id number] Date of Birth: 08/26/1941  Today's Date: 10/30/2016 PT Individual Time: 1000-1100 PT Individual Time Calculation (min): 60 min    Skilled Therapeutic Interventions/Progress Updates:    Pt received in w/c at nurses station. Session focused on R attention, standing balance, and activity tolerance. Pt able to follow one step commands 50% of the time during session. Pt tolerated standing frame x 12 minutes with significant lean to R. Pt with poor attention to R despite max multimodal cuing and with limited attempts to functionally use RUE. Pt with incontinent BM during session. Pt able to transfer sit>stand with min assist at sink but required +2 assist for safety. Pt with fluctuating need of assistance while standing to prevent pushing to R while second person performed peri hygiene total assist. Pt able to assist with scooting back in w/c with multimodal cuing for technique & posture. Encouraged pt to attempt ambulation but pt repeatedly stated "no" and shook his head therefore gait attempt not successful. At end of session pt left sitting in w/c with QRB donned at nurses station.   Therapy Documentation Precautions:  Precautions Precautions: Fall Precaution Comments: R side inattention, strong push to R side, posterior lean Restrictions Weight Bearing Restrictions: No  Pain: Pain Assessment Pain Assessment: Faces Faces Pain Scale: No hurt   See Function Navigator for Current Functional Status.   Therapy/Group: Individual Therapy  Sandi MariscalVictoria M Mystique Bjelland 10/30/2016, 12:16 PM

## 2016-10-31 ENCOUNTER — Inpatient Hospital Stay (HOSPITAL_COMMUNITY): Payer: Medicare Other | Admitting: Speech Pathology

## 2016-10-31 ENCOUNTER — Inpatient Hospital Stay (HOSPITAL_COMMUNITY): Payer: Medicare Other | Admitting: Occupational Therapy

## 2016-10-31 ENCOUNTER — Inpatient Hospital Stay (HOSPITAL_COMMUNITY): Payer: Medicare Other

## 2016-10-31 DIAGNOSIS — Z7902 Long term (current) use of antithrombotics/antiplatelets: Secondary | ICD-10-CM | POA: Diagnosis not present

## 2016-10-31 DIAGNOSIS — J690 Pneumonitis due to inhalation of food and vomit: Secondary | ICD-10-CM | POA: Diagnosis present

## 2016-10-31 DIAGNOSIS — I639 Cerebral infarction, unspecified: Secondary | ICD-10-CM | POA: Diagnosis not present

## 2016-10-31 DIAGNOSIS — I129 Hypertensive chronic kidney disease with stage 1 through stage 4 chronic kidney disease, or unspecified chronic kidney disease: Secondary | ICD-10-CM | POA: Diagnosis present

## 2016-10-31 DIAGNOSIS — I69351 Hemiplegia and hemiparesis following cerebral infarction affecting right dominant side: Secondary | ICD-10-CM | POA: Diagnosis not present

## 2016-10-31 DIAGNOSIS — N179 Acute kidney failure, unspecified: Secondary | ICD-10-CM | POA: Diagnosis present

## 2016-10-31 DIAGNOSIS — Z87891 Personal history of nicotine dependence: Secondary | ICD-10-CM | POA: Diagnosis not present

## 2016-10-31 DIAGNOSIS — Z823 Family history of stroke: Secondary | ICD-10-CM | POA: Diagnosis not present

## 2016-10-31 DIAGNOSIS — R531 Weakness: Secondary | ICD-10-CM | POA: Diagnosis not present

## 2016-10-31 DIAGNOSIS — Z515 Encounter for palliative care: Secondary | ICD-10-CM | POA: Diagnosis not present

## 2016-10-31 DIAGNOSIS — M6281 Muscle weakness (generalized): Secondary | ICD-10-CM | POA: Diagnosis not present

## 2016-10-31 DIAGNOSIS — G9341 Metabolic encephalopathy: Secondary | ICD-10-CM | POA: Diagnosis present

## 2016-10-31 DIAGNOSIS — I472 Ventricular tachycardia: Secondary | ICD-10-CM | POA: Diagnosis not present

## 2016-10-31 DIAGNOSIS — Z7189 Other specified counseling: Secondary | ICD-10-CM | POA: Diagnosis not present

## 2016-10-31 DIAGNOSIS — Z809 Family history of malignant neoplasm, unspecified: Secondary | ICD-10-CM | POA: Diagnosis not present

## 2016-10-31 DIAGNOSIS — R2689 Other abnormalities of gait and mobility: Secondary | ICD-10-CM | POA: Diagnosis not present

## 2016-10-31 DIAGNOSIS — R4701 Aphasia: Secondary | ICD-10-CM | POA: Diagnosis not present

## 2016-10-31 DIAGNOSIS — R918 Other nonspecific abnormal finding of lung field: Secondary | ICD-10-CM | POA: Diagnosis not present

## 2016-10-31 DIAGNOSIS — R7989 Other specified abnormal findings of blood chemistry: Secondary | ICD-10-CM | POA: Diagnosis not present

## 2016-10-31 DIAGNOSIS — N183 Chronic kidney disease, stage 3 (moderate): Secondary | ICD-10-CM | POA: Diagnosis present

## 2016-10-31 DIAGNOSIS — E872 Acidosis: Secondary | ICD-10-CM | POA: Diagnosis present

## 2016-10-31 DIAGNOSIS — E860 Dehydration: Secondary | ICD-10-CM | POA: Diagnosis present

## 2016-10-31 DIAGNOSIS — R4182 Altered mental status, unspecified: Secondary | ICD-10-CM | POA: Diagnosis not present

## 2016-10-31 DIAGNOSIS — E1122 Type 2 diabetes mellitus with diabetic chronic kidney disease: Secondary | ICD-10-CM | POA: Diagnosis present

## 2016-10-31 DIAGNOSIS — E785 Hyperlipidemia, unspecified: Secondary | ICD-10-CM | POA: Diagnosis present

## 2016-10-31 DIAGNOSIS — R1310 Dysphagia, unspecified: Secondary | ICD-10-CM | POA: Diagnosis not present

## 2016-10-31 DIAGNOSIS — E878 Other disorders of electrolyte and fluid balance, not elsewhere classified: Secondary | ICD-10-CM | POA: Diagnosis present

## 2016-10-31 DIAGNOSIS — Z8249 Family history of ischemic heart disease and other diseases of the circulatory system: Secondary | ICD-10-CM | POA: Diagnosis not present

## 2016-10-31 DIAGNOSIS — A419 Sepsis, unspecified organism: Secondary | ICD-10-CM | POA: Diagnosis not present

## 2016-10-31 DIAGNOSIS — R278 Other lack of coordination: Secondary | ICD-10-CM | POA: Diagnosis not present

## 2016-10-31 DIAGNOSIS — Z794 Long term (current) use of insulin: Secondary | ICD-10-CM | POA: Diagnosis not present

## 2016-10-31 DIAGNOSIS — D696 Thrombocytopenia, unspecified: Secondary | ICD-10-CM | POA: Diagnosis present

## 2016-10-31 DIAGNOSIS — E1142 Type 2 diabetes mellitus with diabetic polyneuropathy: Secondary | ICD-10-CM | POA: Diagnosis present

## 2016-10-31 DIAGNOSIS — I63512 Cerebral infarction due to unspecified occlusion or stenosis of left middle cerebral artery: Secondary | ICD-10-CM | POA: Diagnosis not present

## 2016-10-31 DIAGNOSIS — Z7982 Long term (current) use of aspirin: Secondary | ICD-10-CM | POA: Diagnosis not present

## 2016-10-31 DIAGNOSIS — J189 Pneumonia, unspecified organism: Secondary | ICD-10-CM | POA: Diagnosis not present

## 2016-10-31 DIAGNOSIS — R1313 Dysphagia, pharyngeal phase: Secondary | ICD-10-CM | POA: Diagnosis present

## 2016-10-31 DIAGNOSIS — E870 Hyperosmolality and hypernatremia: Secondary | ICD-10-CM | POA: Diagnosis present

## 2016-10-31 DIAGNOSIS — R402431 Glasgow coma scale score 3-8, in the field [EMT or ambulance]: Secondary | ICD-10-CM | POA: Diagnosis not present

## 2016-10-31 LAB — URINE CULTURE: CULTURE: NO GROWTH

## 2016-10-31 LAB — GLUCOSE, CAPILLARY
GLUCOSE-CAPILLARY: 169 mg/dL — AB (ref 65–99)
Glucose-Capillary: 255 mg/dL — ABNORMAL HIGH (ref 65–99)

## 2016-10-31 MED ORDER — HEPARIN SOD (PORK) LOCK FLUSH 100 UNIT/ML IV SOLN
250.0000 [IU] | INTRAVENOUS | Status: AC | PRN
  Administered 2016-10-31: 250 [IU]

## 2016-10-31 NOTE — Progress Notes (Signed)
Speech Language Pathology Daily Session Note  Patient Details  Name: Douglas [name] Bradsher MRN: [id number] Date of Birth: 08/08/1941  Today's Date: 10/31/2016 SLP Individual Time: 0700-0730 SLP Individual Time Calculation (min): 30 min   Short Term Goals: Week 4: SLP Short Term Goal 1 (Week 4): Pt will answer yes/no questions for >80% accuracy with mod assist verbal cues.   SLP Short Term Goal 2 (Week 4): Pt will follow 1 step commands for 75% accuracy with mod assist verbal cues.   SLP Short Term Goal 3 (Week 4): Pt will consume dys 1 textures and honey thick liquids via teaspoon with mod assist for use of swallowing precautions and minimal overt s/s of aspiration.   SLP Short Term Goal 4 (Week 4): Pt will verbalize at the word level during structured tasks for 50% accuracy with max assist multimodal cues. SLP Short Term Goal 5 (Week 4): Pt will utilize multimodal means of communication to convey needs and wants to caregivers with max assist cues.    Skilled Therapeutic Interventions:  Attempted to see pt for skilled ST session to address goals.  Pt was somnolent upon arrival.  Pt was repositioned in bed and environmental modifications were made to maximize alertness for participation in therapies including turning on lights and increasing volume on TV.  Pt with minimal response to modifications and was not able to awaken sufficiently to safely transfer to wheelchair.  SLP also attempted to facilitate alertness via automatic motoric stimulation during oral care, face washing, and brushing hair.  Pt demonstrated brief, trace initiation of tasks with total, hand over hand assist but continued to keep eyes closed throughout session.  Olfactory stimulation of strong coffee was also ineffective for maximizing alertness.  Pt grimaced and turned away from therapist's attempts to engage further.  As a result, session was ended early.    Function:  Eating Eating                  Cognition Comprehension Comprehension assist level: Understands basic less than 25% of the time/ requires cueing >75% of the time  Expression   Expression assist level: Expresses basic 25 - 49% of the time/requires cueing 50 - 75% of the time. Uses single words/gestures.  Social Interaction Social Interaction assist level: Interacts appropriately 25 - 49% of time - Needs frequent redirection.  Problem Solving Problem solving assist level: Solves basic less than 25% of the time - needs direction nearly all the time or does not effectively solve problems and may need a restraint for safety  Memory Memory assist level: Recognizes or recalls less than 25% of the time/requires cueing greater than 75% of the time    Pain Pain Assessment Pain Assessment: No/denies pain  Therapy/Group: Individual Therapy  Lynnie Koehler, Melanee SpryNicole [name] 10/31/2016, 7:35 AM

## 2016-10-31 NOTE — Plan of Care (Signed)
Problem: RH Swallowing Goal: LTG Patient will consume least restrictive PO diet (SLP) LTG:  Patient will consume least restrictive PO diet with assist for use of compensatory strategies  (SLP)  Outcome: Not Met (add Reason) Max assist needed for use of swallowing precautions Goal: LTG Patient will participate in dysphagia therapy (SLP) LTG:  Patient will participate in dysphagia therapy with assist to increase swallow function as evidenced by bedside or objective clinical assessment  (SLP)  Outcome: Not Met (add Reason) Max assist  Goal: LTG Patient will demonstrate a functional change in (SLP) LTG:  Patient will demonstrate a functional change in oral/oropharyngeal swallow as evidenced by an objective assessment (SLP)  Outcome: Not Met (add Reason) Diet had to be downgraded due to slow progress and significant impairment  Problem: RH Expression Communication Goal: LTG Patient will verbally express basic/complex needs (SLP) LTG:  Patient will verbally express basic/complex needs, wants or ideas with cues  (SLP)  Outcome: Not Met (add Reason) Total assist    Problem: RH Attention Goal: LTG Patient will demonstrate focused/sustained (SLP) LTG:  Patient will demonstrate focused/sustained/selective/alternating/divided attention during cognitive/linguistic activities in specific environment with assist for # of minutes   (SLP)  Outcome: Not Met (add Reason) Pt only able to sustain his attention to task for~30 second intervals with max assist   Problem: RH Awareness Goal: LTG: Patient will demonstrate intellectual/emergent (SLP) LTG: Patient will demonstrate intellectual/emergent/anticipatory awareness with assist during a cognitive/linguistic activity  (SLP)  Outcome: Not Met (add Reason) Total assist

## 2016-10-31 NOTE — Progress Notes (Signed)
Transport picked up pt.  [facility] Pt VS are stable.  Called [name] at [phone] and informed that all belongings were taken by transport.

## 2016-10-31 NOTE — Progress Notes (Signed)
Occupational Therapy Discharge Summary  Patient Details  Name: Douglas [name] MRN: [id number] Date of Birth: 09/17/1941  Patient has met 6 of 9 long term goals due to improved balance, postural control and ability to compensate for deficits.  Patient to discharge at [facility] Max Assist level.  Patient's care partner unavailable to provide the necessary physical and cognitive assistance at discharge.  Patient has progressed from +2 assistance with all transfers and standing, to intermittent use of +2 for increased challenge and pt ability to complete transfers at mod-max assist level when motivated and engaged.  Pt's participation has varied due to decreased motivation therefore pt success has varied.  Reasons goals not met: Pt continues to require mod to +2 assistance for transfers and due to incontinence of bowel and catheter placement, pt did not achieve max assist toilet transfers due to decreased attempts.  Continues to require total to +2 assist with toileting hygiene secondary to incontinence and pushing tendencies in more dynamic setup.  Pt with decreased sustained attention to task.  Recommendation:  Patient will benefit from ongoing skilled OT services in skilled nursing facility setting to continue to advance functional skills in the area of BADL and Reduce care partner burden.  Equipment: No equipment provided  Reasons for discharge: treatment goals met and discharge from hospital  Patient/family agrees with progress made and goals achieved: Yes  OT Discharge Precautions/Restrictions  Precautions Precautions: Fall Precaution Comments: R side inattention, strong push to R side, posterior lean Restrictions Weight Bearing Restrictions: No General   Vital Signs Therapy Vitals Temp: 98.4 F (36.9 C) Temp Source: Oral Pulse Rate: 96 Resp: 18 BP: (!) 137/58 Patient Position (if appropriate): Sitting Oxygen Therapy SpO2: 97 % O2 Device: Not Delivered Pain Pain  Assessment Pain Assessment: No/denies pain ADL   Vision/Perception  Vision- History Baseline Vision/History: Wears glasses Wears Glasses: At all times Patient Visual Report:  (difficult to assess secondary to aphasia and decreased attention) Vision- Assessment Vision Assessment?: Vision impaired- to be further tested in functional context Additional Comments: Pt with Lt head turn and gaze preference.  Rt inattention, possibly field cut however unable to fully assess secondary to aphasia, decreased attention impacting ability to formally assess Perception Comments: Rt inattention, however pt able to scan to Rt with increased cues  Cognition Overall Cognitive Status: Impaired/Different from baseline Arousal/Alertness: Lethargic Orientation Level: Oriented to person Attention: Sustained Sustained Attention: Impaired Sustained Attention Impairment: Verbal basic;Functional basic Awareness: Impaired Awareness Impairment: Intellectual impairment Problem Solving: Impaired Problem Solving Impairment: Verbal basic;Functional basic Executive Function:  (all impaired due to lower level deficits) Behaviors: Perseveration Safety/Judgment: Impaired Sensation Sensation Light Touch: Impaired by gross assessment Proprioception: Impaired by gross assessment Coordination Gross Motor Movements are Fluid and Coordinated: No Fine Motor Movements are Fluid and Coordinated: No Coordination and Movement Description: difficult to assess due to aphasia and decreased attention Motor  Motor Motor: Abnormal postural alignment and control;Abnormal tone;Hemiplegia;Motor impersistence Motor - Skilled Clinical Observations: R hemiparesis Mobility     Trunk/Postural Assessment  Cervical Assessment Cervical Assessment: Exceptions to [facility] (forward head) Thoracic Assessment Thoracic Assessment: Exceptions to [facility] (kyphosis) Lumbar Assessment Lumbar Assessment:  (posterior pelvic tilt) Postural  Control Postural Control: Deficits on evaluation Trunk Control: still demonstrates R posterior-lateral lean Righting Reactions: impaired Protective Responses: impaired  Balance Balance Balance Assessed: Yes Static Sitting Balance Static Sitting - Level of Assistance: 5: Stand by assistance;4: Min assist Dynamic Sitting Balance Dynamic Sitting - Level of Assistance: 5: Stand by assistance;1: +1 Total assist;4: Min  assist (fluctuates) Static Standing Balance Static Standing - Level of Assistance: 4: Min assist;3: Mod assist (fluctuates) Dynamic Standing Balance Dynamic Standing - Level of Assistance: 4: Min assist;3: Mod assist;1: +1 Total assist;2: Max assist (fluctuates) Extremity/Trunk Assessment RUE Assessment RUE Assessment: Exceptions to [facility] RUE AROM (degrees) Overall AROM Right Upper Extremity: Deficits;Unable to assess RUE Overall AROM Comments: difficult to assess due to aphasia and decreased attention with decreased ability to follow directions.  Pt able to move RUE but not during purposeful tasks.  Able to wash body with Rt arm with increased time and cues, however with decreased control and unable to self-feed with RUE RUE Strength RUE Overall Strength: Deficits;Unable to assess RUE Overall Strength Comments: difficult to assess due to aphasia and decreased attention with decreased ability to follow directions LUE Assessment LUE Assessment: Within Functional Limits   See Function Navigator for Current Functional Status.  Simonne Come 10/31/2016, 3:56 PM

## 2016-10-31 NOTE — Plan of Care (Signed)
Problem: RH Balance Goal: LTG Patient will maintain dynamic sitting balance (PT) LTG:  Patient will maintain dynamic sitting balance with assistance during mobility activities (PT)  Outcome: Adequate for Discharge Pt fluctuates from steady assist to total assist depending on participation and level of alertness. ABG Goal: LTG Patient will maintain dynamic standing balance (PT) LTG:  Patient will maintain dynamic standing balance with assistance during mobility activities (PT)  Outcome: Completed/Met Date Met: 10/31/16 Pt inconsistent with level of assist. ABG  Problem: RH Bed Mobility Goal: LTG Patient will perform bed mobility with assist (PT) LTG: Patient will perform bed mobility with assistance, with/without cues (PT).  Outcome: Completed/Met Date Met: 10/31/16 Pt with inconsistent level of assist  Problem: RH Bed to Chair Transfers Goal: LTG Patient will perform bed/chair transfers w/assist (PT) LTG: Patient will perform bed/chair transfers with assistance, with/without cues (PT).   Pt inconsistent with level of assist. ABG

## 2016-10-31 NOTE — Progress Notes (Addendum)
Social Work Patient ID: Douglas [name], male   DOB: 11/10/1940, 76 y.o.   MRN: [id number]  Bed offer via [facility]-pt's and family's first choice. Niece to complete paperwork. Will work on once paper work completed to transfer pt over the facility. Pt have informed pt of this plan, unsure how much he understands. [name]-niece to see him at the facility.

## 2016-10-31 NOTE — Progress Notes (Signed)
Social Work  Discharge Note  The overall goal for the admission was met for:   Discharge location: Yes-[facility]-SNF  Length of Stay: Yes-26 days  Discharge activity level: Yes-MIN-MOD LEVEL  Home/community participation: Yes  Services provided included: MD, RD, PT, OT, SLP, RN, CM, TR, Pharmacy and SW  Financial Services: Medicare and Private Insurance: [place]  Follow-up services arranged: Other: NHP  Comments (or additional information):PT HAS SUPPORTIVE FAMILY BUT NO ONE TO PROVIDE 24 HR CARE. HOPE HE WILL CONTINUE TO MAKE PROGRESS AT NH.  Patient/Family verbalized understanding of follow-up arrangements: Yes  Individual responsible for coordination of the follow-up plan: [name]-NIECE & [name]-NEPHEW  Confirmed correct DME delivered: Elease Hashimoto 10/31/2016    Elease Hashimoto

## 2016-10-31 NOTE — Discharge Summary (Deleted)
  The note originally documented on this encounter has been moved the the encounter in which it belongs.  

## 2016-10-31 NOTE — Discharge Summary (Signed)
Discharge summary job [id number]

## 2016-10-31 NOTE — Progress Notes (Signed)
Speech Language Pathology Discharge Summary  Patient Details  Name: WALDEMAR SIEGEL MRN: [id number] Date of Birth: 10/04/1941   Patient has met 2 of 8 long term goals.  Patient to discharge at overall Max level.   Clinical Impression/Discharge Summary:   Pt has made little progress while inpatient and is discharging having met 2 out of 8 short term goals.  Pt is consuming a dys 1 diet with honey thick liquids via teaspoon with max assist multimodal cues for use of swallowing precautions due to significant cognitive and motor planning impairment in addition to a moderately severe oropharyngeal dysphagia.    Pt is essentially nonverbal with the exception of some spontaneous social responses and yes/nos.  He is able to follow 1 step commands and answer simple yes/no questions with mod assist but is otherwise max to total assist for functional communication.  Pt's communication impairments are further exacerbated by significant cognitive impairment characterized by decreased sustained attention to tasks and decreased intellectual awareness of deficits which impact all higher level cognitive processes.  Pt would continue to benefit from skilled ST at next level of care to continue to address dysphagia and cognitive-linguistic function.  Pt would also benefit from ongoing family education at next level of care.    Care Partner:  Caregiver Able to Provide Assistance: Other (comment) (SNF)  Type of Caregiver Assistance:  (SNF)  Recommendation:  Home Health SLP;Outpatient SLP;Skilled Nursing facility;24 hour supervision/assistance  Rationale for SLP Follow Up: Maximize functional communication;Maximize swallowing safety;Reduce caregiver burden;Maximize cognitive function and independence   Equipment: thickener    Reasons for discharge: Discharged from hospital   Patient/Family Agrees with Progress Made and Goals Achieved: Yes    Tiaria Biby, Selinda Orion 10/31/2016, 3:12 PM

## 2016-10-31 NOTE — Progress Notes (Signed)
Physical Therapy Discharge Summary  Patient Details  Name: Douglas [name] MRN: [id number] Date of Birth: [birth year]  Today's Date: 10/31/2016 PT Individual Time: 8185-6314 PT Individual Time Calculation (min): 54 min  No reports of pain. Pt alert but limited cooperation during session to engage in mobility tasks. Required mod assist to get to EOB with extra time, max cues, and encouragement with several attempts. Pt with frequent LOB posterior or the R with decreased motivation to correct - pt shaking head "no" when asked to try to participate. Required max assist to +2 assist for transfers during session and refused attempts at gait on several attempts. Max assist for w/c propulsion x 10' to get to hallway with refusal to participate any further, letting himself slump into the chair to R refusing to assist with correcting positioning of hips. Returned to bed at end of session with handoff to RN to administer medication.   Patient has met 5 of 8 long term goals due to improved activity tolerance, improved balance, improved postural control, increased strength, ability to compensate for deficits, functional use of  right upper extremity and right lower extremity and improved coordination.  Patient to discharge at a fluctuating level of assist pending patient's participation and level of alertness. Pt requires anywhere from min assist to total assist for mobility. Pt has been able to walk up to 120' with min to max assist (increasing assist as fatigued) with RW. Pt often refusing therapies or too lethargic to participate functionally.  Plan to d/c to SNF for continued rehab to progress overall mobility.  Reasons goals not met: Pt did not meet dynamic sitting balance goal or cognition goals due to average level of assist being higher than goal set. Other goals, pt has been inconsistent but is able to complete at average goal level when willing to participate in therapies.   Recommendation:  Patient will  benefit from ongoing skilled PT services in skilled nursing facility setting to continue to advance safe functional mobility, address ongoing impairments in balance, postural control, endurance, activity tolerance, cognition, safety, functional mobility, strength, coordination, and minimize fall risk.  Equipment: No equipment provided. TBD at next venue of care.  Reasons for discharge: discharge from hospital.   Patient/family agrees with progress made and goals achieved: Yes  PT Discharge Precautions/RestrictionsPrecautions Precautions: Fall Precaution Comments: R side inattention, strong push to R side, posterior lean Restrictions Weight Bearing Restrictions: No    Cognition Overall Cognitive Status: Impaired/Different from baseline Arousal/Alertness: Lethargic Orientation Level: Oriented to person Attention: Sustained Sustained Attention: Impaired Sustained Attention Impairment: Verbal basic;Functional basic Awareness: Impaired Awareness Impairment: Intellectual impairment Problem Solving: Impaired Problem Solving Impairment: Verbal basic;Functional basic Executive Function:  (all impaired due to lower level deficits) Behaviors: Perseveration Safety/Judgment: Impaired Sensation Sensation Light Touch: Impaired by gross assessment Proprioception: Impaired by gross assessment Coordination Gross Motor Movements are Fluid and Coordinated: No Motor  Motor Motor: Abnormal postural alignment and control;Abnormal tone;Hemiplegia;Motor impersistence Motor - Skilled Clinical Observations: R hemiparesis     Trunk/Postural Assessment  Cervical Assessment Cervical Assessment: Exceptions to [facility] (forward head) Thoracic Assessment Thoracic Assessment: Exceptions to [facility] (kyphosis) Lumbar Assessment Lumbar Assessment:  (posterior pelvic tilt) Postural Control Postural Control: Deficits on evaluation Trunk Control: still demonstrates R posterior-lateral lean Righting Reactions:  impaired Protective Responses: impaired  Balance Balance Balance Assessed: Yes Static Sitting Balance Static Sitting - Level of Assistance: 5: Stand by assistance;4: Min assist Dynamic Sitting Balance Dynamic Sitting - Level of Assistance: 5: Stand by assistance;1: +1 Total  assist;4: Min assist (fluctuates) Static Standing Balance Static Standing - Level of Assistance: 4: Min assist;3: Mod assist (fluctuates) Dynamic Standing Balance Dynamic Standing - Level of Assistance: 4: Min assist;3: Mod assist;1: +1 Total assist;2: Max assist (fluctuates) Extremity Assessment   see OT d/c summary for UE details.   RLE Assessment RLE Assessment: Exceptions to [facility] RLE Strength RLE Overall Strength Comments: unable to follow formal commands for MMT testing. Grossly able to move against gravity and maintain standing/gait balance with overall mod assist when participatin LLE Assessment LLE Assessment: Exceptions to [facility] LLE Strength LLE Overall Strength Comments: unable to follow formal commands for MMT testing. Grossly able to move against gravity and maintain standing/gait balance with overall mod assist when participatin   See Function Navigator for Current Functional Status.  Canary Brim Ivory Broad, PT, DPT  10/31/2016, 3:30 PM

## 2016-10-31 NOTE — Discharge Summary (Signed)
Douglas [name], Douglas NO.:  [id number]  MEDICAL RECORD NO.:  [id number]  LOCATION:                                 FACILITY:  PHYSICIAN:  Erick Colace, M.D.DATE OF BIRTH:  Mar 29, 1941  DATE OF ADMISSION:  10/05/2016 DATE OF DISCHARGE:  10/31/2016                              DISCHARGE SUMMARY   DISCHARGE DIAGNOSES: 1. Left MCA infarct. 2. Subcutaneous Lovenox for DVT prophylaxis. 3. Pain management. 4. Dysphagia. 5. Aspiration pneumonia resolved. 6. Diabetes mellitus peripheral neuropathy. 7. Hypertension. 8. Stage 3 chronic kidney disease. 9. Depression. 10.Anemia. 11.Thrombocytopenia. 12.Acute lower UTI resolved. 13.Hypokalemia resolved.  HISTORY OF PRESENT ILLNESS:  This is a 76 year old right-handed male with history of diabetes mellitus, remote tobacco and alcohol abuse, hypertension, hyperlipidemia, chronic kidney disease with creatinine 1.62.  Lives with a roommate 1 level home was independent prior to admission.  He presented on September 26, 2016, with aphasia, severe right-sided weakness.  CT of the brain shows suspect edema within the left insular cortex anterior temporal lobe and inferior frontal lobe. No hemorrhage.  Troponin mildly elevated at 0.64.  MRI showed left MCA infarct, multiple other punctate foci of acute ischemia scattered throughout both cerebral and cerebellar hemispheres.  MRA of the head and neck showed occlusion of left MCA at the level of bifurcation.  The patient did not receive tPA.  Echocardiogram with ejection fraction of 55%.  No wall motion abnormalities.  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  Neurology consulted.  Initially, placed on aspirin and Plavix for CVA prophylaxis.  No plan for TEE or loop recorder.  Subcutaneous Lovenox for DVT prophylaxis.  Urology was consulted for difficult catheterization with followup per Dr. Laverle Patter, Foley catheter tube was placed.  He was treated with Unasyn for  aspiration pneumonia.  Blood cultures negative.  Transitioned to Augmentin.  He was on a dysphagia diet with thickened liquids for aspiration precautions.  Bouts of agitation and confusion placed on Depakote.  Physical and occupational therapy ongoing.  The patient was admitted for a comprehensive rehab program.  PAST MEDICAL HISTORY:  See discharge diagnoses.  SOCIAL HISTORY:  Independent prior to admission living with a roommate. Functional status upon admission to rehab services was max total assist for mobility as well as activities of daily living.  PHYSICAL EXAMINATION:  VITAL SIGNS:  Blood pressure 133/70, pulse 80, temperature 98, respirations 18. GENERAL:  This was an alert male, he was aphasic which limited overall exam right facial weakness.  Unable to accurately assess his motor sensory due to aphasia but moves all extremities. LUNGS:  Decreased breath sounds.  Clear to auscultation. CARDIAC:  Regular rate and rhythm.  No murmur. ABDOMEN:  Soft, nontender.  Good bowel sounds.  REHABILITATION HOSPITAL COURSE:  The patient was admitted to inpatient rehab services with therapies initiated on a 3-hour daily basis, consisting of physical therapy, occupational therapy, speech therapy, and rehabilitation nursing.  The following issues were addressed during the patient's rehabilitation stay.  Pertaining to Mr. [name], left MCA infarct remained stable.  He had been on aspirin and Plavix therapy, however, with followup per Gastroenterology as well as Hematology for anemia of acute blood loss, thrombocytopenia, his  aspirin and Plavix were discontinued due to bleeding risks.  His hemoglobin did remain stable.  He was transfused.  There was no initial plan and resuming back his anticoagulation at this time.  There was consideration for EGD, however, family refused.  His blood pressures remained well controlled. He was on a dysphagia 1 honey thick liquid diet.  Monitoring  of hydration.  A PICC line was placed for IV fluids to maintain hydration. Diabetes mellitus, hemoglobin A1c 7.2 with insulin therapy as directed and monitored closely.  Blood pressures overall well controlled.  He did have some tachycardia, most recent troponin negative.  He remained on Lopressor 75 mg twice daily, stage 3 chronic kidney disease, baseline creatinine 1.62 with followup routine chemistries while on thickened liquids.  Bouts of hypokalemia resolved with potassium supplement.  The patient received weekly collaborative interdisciplinary team conferences to discuss estimated length of stay, family teaching, any barriers to discharge.  Sessions focused on right attention standing balance activity tolerance.  He did follow one-step commands 50% of the time, but overall quite limited due to his CVA.  Poor attention to the right despite max cues.  Able to transfer sit to stand with minimal assistance at the sink, but required +2 assistance for his overall safety. Activities of daily living and homemaking were still required max assistance with cuing, the patient with posterior lean when attempting to don lower body clothing items and follow 1 step verbal commands.  All issues in regard for his care were discussed with family.  It was noted a skilled nursing facility was needed with bed becoming available on October 31, 2016.  DISCHARGE MEDICATIONS:  Lipitor 80 mg p.o. daily, Celexa 10 mg p.o. daily, folic acid 1 mg p.o. daily, Lantus insulin 25 units subcutaneous daily, NovoLog insulin 14 units t.i.d. meals, Synthroid 75 mcg p.o. breakfast, Lopressor 75 mg p.o. b.i.d., Protonix suspension 40 mg twice daily, valproic acid 250 mg p.o. b.i.d., potassium chloride 40 mEq p.o. daily. 0.45 sodium chloride IV fluids at 75 mL an hour.  DIET:  Dysphagia #1 honey thick liquids.  SPECIAL INSTRUCTIONS:  Continue Foley tube due to urinary retention, no current plans for aspirin or Plavix  therapy due to suspect GI bleed which family had requested no further workup as well as thrombocytopenia.  The patient could follow up with Dr. Pamelia HoitKirchmayer, physical medicine rehab while at skilled nursing facility for consultation in regard to physical medicine rehab.     Mariam Dollaraniel Angiulli, P.A.   ______________________________ Erick ColaceAndrew E. Kirsteins, M.D.    DA/MEDQ  D:  10/31/2016  T:  10/31/2016  Job:  [id number]  cc:   Erick ColaceAndrew E. Kirsteins, M.D.

## 2016-10-31 NOTE — Progress Notes (Signed)
[phone] spoke to [place], LPN.Marland Kitchen.gave report to her.  Informed that transport has been called but unsure of arrival time at this point.

## 2016-10-31 NOTE — Progress Notes (Signed)
Occupational Therapy Note  Patient Details  Name: Douglas [name] MRN: [id number] Date of Birth: 05/01/1941  Today's Date: 10/31/2016 OT Individual Time: [id number] OT Individual Time Calculation (min): 15 min  and Today's Date: 10/31/2016 OT Missed Time: 30 Minutes Missed Time Reason: Patient fatigue  Attempted to see pt for skilled OT treatment session.  Pt somnolent upon arrival.  Attempted to engage pt in bed mobility to increase arousal with pt opening eyes briefly and then closing them again.  Pt turning head away from therapist and stimulation with attempts to wash face with cool cloth.  Provided pt with pants to attempt to engage in dressing at bed level with pt continuing to keep eyes closed and head turned away from therapist.  Session ended, left in bed with lights on to attempt to promote arousal.  Will continue to follow as able.   Douglas [name], Douglas [name] 10/31/2016, 8:57 AM

## 2016-11-01 ENCOUNTER — Inpatient Hospital Stay (HOSPITAL_COMMUNITY): Payer: Medicare Other | Admitting: Physical Therapy

## 2016-11-01 ENCOUNTER — Inpatient Hospital Stay (HOSPITAL_COMMUNITY): Payer: Medicare Other | Admitting: Occupational Therapy

## 2016-11-02 ENCOUNTER — Emergency Department (HOSPITAL_COMMUNITY): Payer: Medicare Other

## 2016-11-02 ENCOUNTER — Inpatient Hospital Stay (HOSPITAL_COMMUNITY)
Admission: EM | Admit: 2016-11-02 | Discharge: 2016-11-16 | DRG: 871 | Disposition: E | Payer: Medicare Other | Attending: Family Medicine | Admitting: Family Medicine

## 2016-11-02 ENCOUNTER — Encounter (HOSPITAL_COMMUNITY): Payer: Self-pay | Admitting: Emergency Medicine

## 2016-11-02 DIAGNOSIS — R1313 Dysphagia, pharyngeal phase: Secondary | ICD-10-CM | POA: Diagnosis present

## 2016-11-02 DIAGNOSIS — E039 Hypothyroidism, unspecified: Secondary | ICD-10-CM | POA: Diagnosis present

## 2016-11-02 DIAGNOSIS — Z809 Family history of malignant neoplasm, unspecified: Secondary | ICD-10-CM | POA: Diagnosis not present

## 2016-11-02 DIAGNOSIS — I63512 Cerebral infarction due to unspecified occlusion or stenosis of left middle cerebral artery: Secondary | ICD-10-CM | POA: Diagnosis not present

## 2016-11-02 DIAGNOSIS — Z7189 Other specified counseling: Secondary | ICD-10-CM

## 2016-11-02 DIAGNOSIS — E860 Dehydration: Secondary | ICD-10-CM | POA: Diagnosis present

## 2016-11-02 DIAGNOSIS — E872 Acidosis, unspecified: Secondary | ICD-10-CM

## 2016-11-02 DIAGNOSIS — A419 Sepsis, unspecified organism: Secondary | ICD-10-CM | POA: Diagnosis not present

## 2016-11-02 DIAGNOSIS — E1142 Type 2 diabetes mellitus with diabetic polyneuropathy: Secondary | ICD-10-CM | POA: Diagnosis present

## 2016-11-02 DIAGNOSIS — R4182 Altered mental status, unspecified: Secondary | ICD-10-CM

## 2016-11-02 DIAGNOSIS — N179 Acute kidney failure, unspecified: Secondary | ICD-10-CM | POA: Diagnosis present

## 2016-11-02 DIAGNOSIS — Z8249 Family history of ischemic heart disease and other diseases of the circulatory system: Secondary | ICD-10-CM | POA: Diagnosis not present

## 2016-11-02 DIAGNOSIS — I639 Cerebral infarction, unspecified: Secondary | ICD-10-CM | POA: Diagnosis not present

## 2016-11-02 DIAGNOSIS — E1122 Type 2 diabetes mellitus with diabetic chronic kidney disease: Secondary | ICD-10-CM | POA: Diagnosis present

## 2016-11-02 DIAGNOSIS — I69320 Aphasia following cerebral infarction: Secondary | ICD-10-CM

## 2016-11-02 DIAGNOSIS — E785 Hyperlipidemia, unspecified: Secondary | ICD-10-CM | POA: Diagnosis present

## 2016-11-02 DIAGNOSIS — Z87891 Personal history of nicotine dependence: Secondary | ICD-10-CM | POA: Diagnosis not present

## 2016-11-02 DIAGNOSIS — Z7902 Long term (current) use of antithrombotics/antiplatelets: Secondary | ICD-10-CM | POA: Diagnosis not present

## 2016-11-02 DIAGNOSIS — M6281 Muscle weakness (generalized): Secondary | ICD-10-CM | POA: Diagnosis not present

## 2016-11-02 DIAGNOSIS — E876 Hypokalemia: Secondary | ICD-10-CM | POA: Diagnosis not present

## 2016-11-02 DIAGNOSIS — Z794 Long term (current) use of insulin: Secondary | ICD-10-CM | POA: Diagnosis not present

## 2016-11-02 DIAGNOSIS — E870 Hyperosmolality and hypernatremia: Secondary | ICD-10-CM | POA: Diagnosis present

## 2016-11-02 DIAGNOSIS — I472 Ventricular tachycardia: Secondary | ICD-10-CM | POA: Diagnosis not present

## 2016-11-02 DIAGNOSIS — E878 Other disorders of electrolyte and fluid balance, not elsewhere classified: Secondary | ICD-10-CM | POA: Diagnosis present

## 2016-11-02 DIAGNOSIS — R278 Other lack of coordination: Secondary | ICD-10-CM | POA: Diagnosis not present

## 2016-11-02 DIAGNOSIS — R778 Other specified abnormalities of plasma proteins: Secondary | ICD-10-CM

## 2016-11-02 DIAGNOSIS — N183 Chronic kidney disease, stage 3 (moderate): Secondary | ICD-10-CM | POA: Diagnosis present

## 2016-11-02 DIAGNOSIS — Z7982 Long term (current) use of aspirin: Secondary | ICD-10-CM | POA: Diagnosis not present

## 2016-11-02 DIAGNOSIS — J690 Pneumonitis due to inhalation of food and vomit: Secondary | ICD-10-CM | POA: Diagnosis present

## 2016-11-02 DIAGNOSIS — J189 Pneumonia, unspecified organism: Secondary | ICD-10-CM | POA: Diagnosis not present

## 2016-11-02 DIAGNOSIS — I69351 Hemiplegia and hemiparesis following cerebral infarction affecting right dominant side: Secondary | ICD-10-CM | POA: Diagnosis not present

## 2016-11-02 DIAGNOSIS — I129 Hypertensive chronic kidney disease with stage 1 through stage 4 chronic kidney disease, or unspecified chronic kidney disease: Secondary | ICD-10-CM | POA: Diagnosis present

## 2016-11-02 DIAGNOSIS — Z66 Do not resuscitate: Secondary | ICD-10-CM | POA: Diagnosis present

## 2016-11-02 DIAGNOSIS — Z515 Encounter for palliative care: Secondary | ICD-10-CM | POA: Diagnosis not present

## 2016-11-02 DIAGNOSIS — Z823 Family history of stroke: Secondary | ICD-10-CM

## 2016-11-02 DIAGNOSIS — I69391 Dysphagia following cerebral infarction: Secondary | ICD-10-CM

## 2016-11-02 DIAGNOSIS — D72829 Elevated white blood cell count, unspecified: Secondary | ICD-10-CM

## 2016-11-02 DIAGNOSIS — G9341 Metabolic encephalopathy: Secondary | ICD-10-CM | POA: Diagnosis present

## 2016-11-02 DIAGNOSIS — R531 Weakness: Secondary | ICD-10-CM | POA: Diagnosis not present

## 2016-11-02 DIAGNOSIS — R7989 Other specified abnormal findings of blood chemistry: Secondary | ICD-10-CM | POA: Diagnosis present

## 2016-11-02 DIAGNOSIS — R918 Other nonspecific abnormal finding of lung field: Secondary | ICD-10-CM | POA: Diagnosis not present

## 2016-11-02 DIAGNOSIS — R1310 Dysphagia, unspecified: Secondary | ICD-10-CM | POA: Diagnosis not present

## 2016-11-02 DIAGNOSIS — I10 Essential (primary) hypertension: Secondary | ICD-10-CM | POA: Diagnosis present

## 2016-11-02 DIAGNOSIS — D696 Thrombocytopenia, unspecified: Secondary | ICD-10-CM | POA: Diagnosis present

## 2016-11-02 DIAGNOSIS — R4701 Aphasia: Secondary | ICD-10-CM | POA: Diagnosis not present

## 2016-11-02 DIAGNOSIS — R402431 Glasgow coma scale score 3-8, in the field [EMT or ambulance]: Secondary | ICD-10-CM | POA: Diagnosis not present

## 2016-11-02 LAB — I-STAT VENOUS BLOOD GAS, ED
ACID-BASE EXCESS: 1 mmol/L (ref 0.0–2.0)
Bicarbonate: 24.4 mmol/L (ref 20.0–28.0)
O2 SAT: 70 %
PH VEN: 7.474 — AB (ref 7.250–7.430)
TCO2: 25 mmol/L (ref 0–100)
pCO2, Ven: 33.3 mmHg — ABNORMAL LOW (ref 44.0–60.0)
pO2, Ven: 34 mmHg (ref 32.0–45.0)

## 2016-11-02 LAB — COMPREHENSIVE METABOLIC PANEL
ALBUMIN: 2 g/dL — AB (ref 3.5–5.0)
ALK PHOS: 421 U/L — AB (ref 38–126)
ALT: 57 U/L (ref 17–63)
AST: 92 U/L — ABNORMAL HIGH (ref 15–41)
Anion gap: 12 (ref 5–15)
BUN: 47 mg/dL — ABNORMAL HIGH (ref 6–20)
CALCIUM: 8.6 mg/dL — AB (ref 8.9–10.3)
CO2: 20 mmol/L — AB (ref 22–32)
CREATININE: 2.29 mg/dL — AB (ref 0.61–1.24)
Chloride: 116 mmol/L — ABNORMAL HIGH (ref 101–111)
GFR calc Af Amer: 30 mL/min — ABNORMAL LOW (ref >60)
GFR calc non Af Amer: 26 mL/min — ABNORMAL LOW (ref >60)
GLUCOSE: 161 mg/dL — AB (ref 65–99)
Potassium: 4.7 mmol/L (ref 3.5–5.1)
SODIUM: 148 mmol/L — AB (ref 135–145)
Total Bilirubin: 3.3 mg/dL — ABNORMAL HIGH (ref 0.3–1.2)
Total Protein: 6.7 g/dL (ref 6.5–8.1)

## 2016-11-02 LAB — CBC WITH DIFFERENTIAL/PLATELET
Basophils Absolute: 0 10*3/uL (ref 0.0–0.1)
Basophils Relative: 0 %
EOS ABS: 0 10*3/uL (ref 0.0–0.7)
Eosinophils Relative: 0 %
HEMATOCRIT: 39.7 % (ref 39.0–52.0)
Hemoglobin: 12 g/dL — ABNORMAL LOW (ref 13.0–17.0)
LYMPHS ABS: 1.3 10*3/uL (ref 0.7–4.0)
Lymphocytes Relative: 7 %
MCH: 28.1 pg (ref 26.0–34.0)
MCHC: 30.2 g/dL (ref 30.0–36.0)
MCV: 93 fL (ref 78.0–100.0)
MONO ABS: 1.7 10*3/uL — AB (ref 0.1–1.0)
MONOS PCT: 10 %
Neutro Abs: 15.1 10*3/uL — ABNORMAL HIGH (ref 1.7–7.7)
Neutrophils Relative %: 83 %
Platelets: 146 10*3/uL — ABNORMAL LOW (ref 150–400)
RBC: 4.27 MIL/uL (ref 4.22–5.81)
RDW: 20.9 % — AB (ref 11.5–15.5)
WBC: 18.2 10*3/uL — ABNORMAL HIGH (ref 4.0–10.5)

## 2016-11-02 LAB — URINALYSIS, ROUTINE W REFLEX MICROSCOPIC
Glucose, UA: NEGATIVE mg/dL
Hgb urine dipstick: NEGATIVE
Ketones, ur: NEGATIVE mg/dL
Leukocytes, UA: NEGATIVE
Nitrite: NEGATIVE
PH: 5 (ref 5.0–8.0)
PROTEIN: 30 mg/dL — AB
Specific Gravity, Urine: 1.023 (ref 1.005–1.030)

## 2016-11-02 LAB — I-STAT CG4 LACTIC ACID, ED: Lactic Acid, Venous: 2.96 mmol/L (ref 0.5–1.9)

## 2016-11-02 LAB — I-STAT TROPONIN, ED: TROPONIN I, POC: 0.21 ng/mL — AB (ref 0.00–0.08)

## 2016-11-02 LAB — GLUCOSE, CAPILLARY: Glucose-Capillary: 156 mg/dL — ABNORMAL HIGH (ref 65–99)

## 2016-11-02 LAB — PROTIME-INR
INR: 1.24
Prothrombin Time: 15.7 seconds — ABNORMAL HIGH (ref 11.4–15.2)

## 2016-11-02 LAB — APTT: APTT: 32 s (ref 24–36)

## 2016-11-02 LAB — TROPONIN I: Troponin I: 0.29 ng/mL (ref <0.03)

## 2016-11-02 MED ORDER — SODIUM CHLORIDE 0.9 % IV BOLUS (SEPSIS)
250.0000 mL | Freq: Once | INTRAVENOUS | Status: AC
  Administered 2016-11-02: 250 mL via INTRAVENOUS

## 2016-11-02 MED ORDER — SODIUM CHLORIDE 0.9 % IV SOLN
INTRAVENOUS | Status: DC
  Administered 2016-11-02: 21:00:00 via INTRAVENOUS

## 2016-11-02 MED ORDER — INSULIN ASPART 100 UNIT/ML ~~LOC~~ SOLN
0.0000 [IU] | Freq: Three times a day (TID) | SUBCUTANEOUS | Status: DC
  Administered 2016-11-03: 3 [IU] via SUBCUTANEOUS
  Administered 2016-11-03: 2 [IU] via SUBCUTANEOUS
  Administered 2016-11-03 – 2016-11-04 (×2): 1 [IU] via SUBCUTANEOUS
  Administered 2016-11-04: 2 [IU] via SUBCUTANEOUS
  Administered 2016-11-05: 1 [IU] via SUBCUTANEOUS
  Administered 2016-11-05 – 2016-11-06 (×4): 2 [IU] via SUBCUTANEOUS

## 2016-11-02 MED ORDER — PIPERACILLIN-TAZOBACTAM 3.375 G IVPB
3.3750 g | Freq: Three times a day (TID) | INTRAVENOUS | Status: DC
Start: 2016-11-03 — End: 2016-11-06
  Administered 2016-11-03 – 2016-11-06 (×12): 3.375 g via INTRAVENOUS
  Filled 2016-11-02 (×14): qty 50

## 2016-11-02 MED ORDER — FERROUS SULFATE 325 (65 FE) MG PO TABS
325.0000 mg | ORAL_TABLET | Freq: Every day | ORAL | Status: DC
  Administered 2016-11-03: 325 mg via ORAL
  Filled 2016-11-02: qty 1

## 2016-11-02 MED ORDER — ENOXAPARIN SODIUM 30 MG/0.3ML ~~LOC~~ SOLN
30.0000 mg | SUBCUTANEOUS | Status: DC
  Administered 2016-11-02 – 2016-11-04 (×3): 30 mg via SUBCUTANEOUS
  Filled 2016-11-02 (×4): qty 0.3

## 2016-11-02 MED ORDER — VANCOMYCIN HCL IN DEXTROSE 1-5 GM/200ML-% IV SOLN
1000.0000 mg | Freq: Once | INTRAVENOUS | Status: AC
  Administered 2016-11-02: 1000 mg via INTRAVENOUS
  Filled 2016-11-02: qty 200

## 2016-11-02 MED ORDER — METOPROLOL TARTRATE 12.5 MG HALF TABLET
12.5000 mg | ORAL_TABLET | Freq: Two times a day (BID) | ORAL | Status: DC
  Administered 2016-11-02 – 2016-11-03 (×2): 12.5 mg via ORAL
  Filled 2016-11-02 (×2): qty 1

## 2016-11-02 MED ORDER — SODIUM CHLORIDE 0.9% FLUSH
3.0000 mL | Freq: Two times a day (BID) | INTRAVENOUS | Status: DC
  Administered 2016-11-02 – 2016-11-06 (×7): 3 mL via INTRAVENOUS

## 2016-11-02 MED ORDER — ATORVASTATIN CALCIUM 80 MG PO TABS
80.0000 mg | ORAL_TABLET | Freq: Every day | ORAL | Status: DC
  Administered 2016-11-02: 80 mg via ORAL
  Filled 2016-11-02: qty 1

## 2016-11-02 MED ORDER — ONDANSETRON HCL 4 MG/2ML IJ SOLN: 4.0000 mg | Freq: Four times a day (QID) | INTRAMUSCULAR | Status: DC | PRN

## 2016-11-02 MED ORDER — INSULIN GLARGINE 100 UNIT/ML ~~LOC~~ SOLN
12.0000 [IU] | Freq: Every day | SUBCUTANEOUS | Status: DC
  Administered 2016-11-02 – 2016-11-05 (×4): 12 [IU] via SUBCUTANEOUS
  Filled 2016-11-02 (×5): qty 0.12

## 2016-11-02 MED ORDER — SENNOSIDES-DOCUSATE SODIUM 8.6-50 MG PO TABS: 1.0000 | ORAL_TABLET | Freq: Every evening | ORAL | Status: DC | PRN

## 2016-11-02 MED ORDER — VANCOMYCIN HCL IN DEXTROSE 750-5 MG/150ML-% IV SOLN
750.0000 mg | Freq: Two times a day (BID) | INTRAVENOUS | Status: DC
  Administered 2016-11-03: 750 mg via INTRAVENOUS
  Filled 2016-11-02: qty 150

## 2016-11-02 MED ORDER — ASPIRIN 300 MG RE SUPP
300.0000 mg | Freq: Once | RECTAL | Status: AC
  Administered 2016-11-02: 300 mg via RECTAL
  Filled 2016-11-02: qty 1

## 2016-11-02 MED ORDER — SODIUM CHLORIDE 0.9 % IV BOLUS (SEPSIS)
1000.0000 mL | Freq: Once | INTRAVENOUS | Status: AC
  Administered 2016-11-02: 1000 mL via INTRAVENOUS

## 2016-11-02 MED ORDER — PIPERACILLIN-TAZOBACTAM 3.375 G IVPB 30 MIN
3.3750 g | Freq: Once | INTRAVENOUS | Status: AC
  Administered 2016-11-02: 3.375 g via INTRAVENOUS
  Filled 2016-11-02: qty 50

## 2016-11-02 MED ORDER — VITAMIN D 1000 UNITS PO TABS
1000.0000 [IU] | ORAL_TABLET | Freq: Every day | ORAL | Status: DC
  Administered 2016-11-02 – 2016-11-03 (×2): 1000 [IU] via ORAL
  Filled 2016-11-02 (×2): qty 1

## 2016-11-02 MED ORDER — LEVOTHYROXINE SODIUM 75 MCG PO TABS
75.0000 ug | ORAL_TABLET | Freq: Every day | ORAL | Status: DC
  Administered 2016-11-03: 75 ug via ORAL
  Filled 2016-11-02: qty 1

## 2016-11-02 MED ORDER — CLOPIDOGREL BISULFATE 75 MG PO TABS
75.0000 mg | ORAL_TABLET | Freq: Every day | ORAL | Status: DC
  Administered 2016-11-02 – 2016-11-03 (×2): 75 mg via ORAL
  Filled 2016-11-02 (×2): qty 1

## 2016-11-02 MED ORDER — ASPIRIN 325 MG PO TABS
325.0000 mg | ORAL_TABLET | Freq: Every day | ORAL | Status: DC
  Administered 2016-11-02 – 2016-11-03 (×2): 325 mg via ORAL
  Filled 2016-11-02 (×2): qty 1

## 2016-11-02 MED ORDER — ACETAMINOPHEN 325 MG PO TABS: 650.0000 mg | ORAL_TABLET | ORAL | Status: DC | PRN

## 2016-11-02 MED ORDER — ONDANSETRON HCL 4 MG PO TABS: 4.0000 mg | ORAL_TABLET | Freq: Four times a day (QID) | ORAL | Status: DC | PRN

## 2016-11-02 MED ORDER — VALPROATE SODIUM 250 MG/5ML PO SOLN
250.0000 mg | Freq: Two times a day (BID) | ORAL | Status: DC
  Administered 2016-11-02 – 2016-11-03 (×2): 250 mg via ORAL
  Filled 2016-11-02 (×2): qty 5

## 2016-11-02 NOTE — ED Triage Notes (Signed)
[facility]/o altered mental status.

## 2016-11-02 NOTE — ED Notes (Signed)
Dr. Joaquim LaiFranasiak notified of elevated istat trop

## 2016-11-02 NOTE — Progress Notes (Signed)
Pharmacy Antibiotic Note  Douglas [name] is a 76 y.o. male admitted on 10/25/2016 with sepsis.  Pharmacy has been consulted for vancomycin and zosyn dosing.  Patient received vancomycin 1g and zosyn 3.375g IV once in the ED.  Plan: Vancomycin 750mg  IV every 12 hours.  Goal trough 15-20 mcg/mL. Zosyn 3.375g IV q8h (4 hour infusion).  Monitor culture data, renal function and [facility]     No data recorded.   Recent Labs Lab 10/27/16 0631 10/30/16 0658  WBC  --  8.6  CREATININE 1.30* 1.11    Estimated Creatinine Clearance: 58.2 mL/min (by C-G formula based on SCr of 1.11 mg/dL).    No Known Allergies   [name], PharmD, BCPS Clinical Pharmacist Pager [phone] 10/26/2016 5:57 PM

## 2016-11-02 NOTE — ED Notes (Signed)
Abnormal VBG and CG-4 reported to Dr. Dalene SeltzerSchlossman

## 2016-11-02 NOTE — Progress Notes (Signed)
Patient arrived, no belongs were brought from nursing home

## 2016-11-02 NOTE — H&P (Signed)
History and Physical    Douglas [name] DOB: [birth year] DOA: 11/03/2016  PCP: Hollice Espy, MD   Patient coming from: [facility]   Chief Complaint: AMS and fever.  HPI: Douglas [name] is a 76 y.o. male with medical history significant of CK-MB, type 2 diabetes, hyperlipidemia, hypertension who was discharged on 10/05/2016 due to acute embolic stroke and is brought from the skilled nursing facility due to altered mental status and fever. The patient is unable to provide further history at this time. History is taken from EMS report and medical records.  ED Course: The patient received normal saline boluses, supplemental oxygen, vancomycin and Zosyn. His workup showed WBC of 18.2, hemoglobin of 12.0 g/dL, platelets of 811. Lactic acid was 2.96, sodium 148, potassium 4.7, chloride 116 bicarbonate 20 mmol/L. BUN was 47, creatinine 2.29, total bilirubin 3.3 and glucose 161 mg/dL. AST was 92, ALT 57 and alkaline phosphatase 421 units. His troponin level was 0.21. Chest radiograph showed patchy infiltrates consistent with pneumonia.  Review of Systems: As per HPI otherwise 10 point review of systems negative.    Past Medical History:  Diagnosis Date  . CKD (chronic kidney disease)   . DM (diabetes mellitus) (HCC)   . Hyperlipidemia   . Hypertension     History reviewed. No pertinent surgical history.   reports that he quit smoking about 34 years ago. He has never used smokeless tobacco. He reports that he drinks alcohol. He reports that he does not use drugs.  No Known Allergies  Family History  Problem Relation Age of Onset  . Heart disease Mother   . Cancer Father   . Stroke Brother 23    Prior to Admission medications   Medication Sig Start Date End Date Taking? Authorizing Provider  acetaminophen (TYLENOL) 325 MG tablet Take 2 tablets (650 mg total) by mouth every 4 (four) hours as needed for mild pain (or temp > 37.5 C (99.5 F)). 10/05/16    Leana Roe Elgergawy, MD  amoxicillin-clavulanate (AUGMENTIN) 875-125 MG tablet Take 1 tablet by mouth every 12 (twelve) hours. Continue through 12/22, and then stop 10/05/16   Starleen Arms, MD  aspirin 325 MG tablet Take 1 tablet (325 mg total) by mouth daily. 10/06/16   Leana Roe Elgergawy, MD  atorvastatin (LIPITOR) 80 MG tablet Take 80 mg by mouth daily.    Historical Provider, MD  cholecalciferol (VITAMIN D) 1000 UNITS tablet Take 1,000 Units by mouth daily.    Historical Provider, MD  clopidogrel (PLAVIX) 75 MG tablet Take 1 tablet (75 mg total) by mouth daily. 10/06/16   Leana Roe Elgergawy, MD  ferrous sulfate 325 (65 FE) MG tablet Take 325 mg by mouth daily with breakfast.    Historical Provider, MD  insulin aspart (NOVOLOG) 100 UNIT/ML injection Inject 0-5 Units into the skin at bedtime. Patient taking differently: Inject 0-15 Units into the skin See admin instructions. Takes 0-15 units 3 times daily Takes 0-5 units at bedtime Sliding scale 10/05/16   Starleen Arms, MD  insulin glargine (LANTUS) 100 UNIT/ML injection Inject 0.12 mLs (12 Units total) into the skin daily. 10/06/16   Leana Roe Elgergawy, MD  levothyroxine (SYNTHROID, LEVOTHROID) 75 MCG tablet Take 75 mcg by mouth daily before breakfast.     Historical Provider, MD  metoprolol tartrate (LOPRESSOR) 25 MG tablet Take 0.5 tablets (12.5 mg total) by mouth 2 (two) times daily. 10/05/16   Leana Roe Elgergawy, MD  senna-docusate (SENOKOT-S) 8.6-50 MG tablet Take  1 tablet by mouth at bedtime as needed for mild constipation. 10/05/16   Starleen Arms, MD  Valproate Sodium (DEPAKENE) 250 MG/5ML SOLN solution Take 5 mLs (250 mg total) by mouth 2 (two) times daily. 10/05/16   Starleen Arms, MD    Physical Exam:   Constitutional: Looks acutely ill Vitals:   11/15/16 1945 11/15/2016 2000 15-Nov-2016 2015 2016/11/15 2037  BP: 145/63 150/67 156/70 (!) 155/66  Pulse: (!) 122 (!) 122 (!) 123 (!) 129  Resp: (!) 27 (!) 28 (!) 30  (!) 27  Temp:    98.3 F (36.8 C)  TempSrc:    Axillary  SpO2: 96% 93% 94% 90%  Weight:    66 kg (145 lb 8.1 oz)   Eyes: PERRL, lids and conjunctivae normal ENMT: Mucous membranes are dry. Posterior pharynx clear of any exudate or lesions  Neck: normal, supple, no masses, no thyromegaly Respiratory: Decreased breath sounds on bases, no wheezing, no crackles. Tachypneic No accessory muscle use.  Cardiovascular: Tachycardic at 120 BPM, no murmurs / rubs / gallops. No extremity edema. 2+ pedal pulses. No carotid bruits.  Abdomen: Soft, no tenderness, no masses palpated. No hepatosplenomegaly. Bowel sounds positive.  Musculoskeletal: no clubbing / cyanosis. Good passive ROM, no contractures. Normal muscle tone.  Skin: Erythema on sacral area. Neurologic: Aphasic, moves all extremities, does not follow commands  Psychiatric: Obtunded, does not follow commands.    Labs on Admission: I have personally reviewed following labs and imaging studies  CBC:  Recent Labs Lab 10/30/16 0658 11-15-16 1806  WBC 8.6 18.2*  NEUTROABS 5.5 15.1*  HGB 10.6* 12.0*  HCT 34.6* 39.7  MCV 90.6 93.0  PLT 87* 146*   Basic Metabolic Panel:  Recent Labs Lab 10/27/16 0631 10/28/16 0515 10/30/16 0658 Nov 15, 2016 1806  NA 146*  --  144 148*  K 3.1*  --  3.6 4.7  CL 115*  --  112* 116*  CO2 24  --  23 20*  GLUCOSE 133*  --  141* 161*  BUN 24*  --  25* 47*  CREATININE 1.30*  --  1.11 2.29*  CALCIUM 8.1*  --  8.3* 8.6*  MG  --  1.7  --   --    GFR: Estimated Creatinine Clearance: 26 mL/min (by C-G formula based on SCr of 2.29 mg/dL (H)). Liver Function Tests:  Recent Labs Lab 11-15-2016 1806  AST 92*  ALT 57  ALKPHOS 421*  BILITOT 3.3*  PROT 6.7  ALBUMIN 2.0*   No results for input(s): LIPASE, AMYLASE in the last 168 hours. No results for input(s): AMMONIA in the last 168 hours. Coagulation Profile:  Recent Labs Lab 2016-11-15 1848  INR 1.24   Cardiac Enzymes: No results for input(s):  CKTOTAL, CKMB, CKMBINDEX, TROPONINI in the last 168 hours. BNP (last 3 results) No results for input(s): PROBNP in the last 8760 hours. HbA1C: No results for input(s): HGBA1C in the last 72 hours. CBG:  Recent Labs Lab 10/30/16 1131 10/30/16 1644 10/30/16 2100 10/31/16 0634 10/31/16 1125  GLUCAP 189* 176* 150* 169* 255*   Lipid Profile: No results for input(s): CHOL, HDL, LDLCALC, TRIG, CHOLHDL, LDLDIRECT in the last 72 hours. Thyroid Function Tests: No results for input(s): TSH, T4TOTAL, FREET4, T3FREE, THYROIDAB in the last 72 hours. Anemia Panel: No results for input(s): VITAMINB12, FOLATE, FERRITIN, TIBC, IRON, RETICCTPCT in the last 72 hours. Urine analysis:    Component Value Date/Time   COLORURINE AMBER (A) 2016/11/15 1825   APPEARANCEUR CLOUDY (A)  21-Apr-2017 1825   LABSPEC 1.023 21-Apr-2017 1825   PHURINE 5.0 21-Apr-2017 1825   GLUCOSEU NEGATIVE 21-Apr-2017 1825   HGBUR NEGATIVE 21-Apr-2017 1825   BILIRUBINUR SMALL (A) 21-Apr-2017 1825   KETONESUR NEGATIVE 21-Apr-2017 1825   PROTEINUR 30 (A) 21-Apr-2017 1825   NITRITE NEGATIVE 21-Apr-2017 1825   LEUKOCYTESUR NEGATIVE 21-Apr-2017 1825    Recent Results (from the past 240 hour(s))  Culture, Urine     Status: None   Collection Time: 10/30/16  4:31 PM  Result Value Ref Range Status   Specimen Description URINE, RANDOM  Final   Special Requests NONE  Final   Culture NO GROWTH  Final   Report Status 10/31/2016 FINAL  Final     Radiological Exams on Admission: Ct Head Wo Contrast  Result Date: 09/20/17 CLINICAL DATA:  Altered mental status.  Suspect sepsis. EXAM: CT HEAD WITHOUT CONTRAST TECHNIQUE: Contiguous axial images were obtained from the base of the skull through the vertex without intravenous contrast. COMPARISON:  September 29, 2016 CT scan FINDINGS: Brain: No subdural, epidural, or subarachnoid hemorrhage. No intraparenchymal hemorrhage. White matter changes are identified, more marked on the left than the right.  The hyperdense left MCA on the previous study is no longer visualized. Low-attenuation in the left cerebellar hemisphere is unchanged, consistent with infarct. There is remarkably little involvement of the left MCA territory cortex given the recent left MCA ischemic event. There is likely some involvement left frontal cortex such as on image 15. The ventricles and sulci are prominent and stable. No mass, mass effect, or midline shift. The basal cisterns are patent. The brainstem is normal. No evidence of acute ischemia or infarct. Vascular: No hyperdense vessel or unexpected calcification. Skull: Normal. Negative for fracture or focal lesion. Sinuses/Orbits: No acute finding. Other: None. IMPRESSION: 1. No bleed or evidence of acute ischemia. Infarcts are seen in the left frontal lobe and left cerebellar hemisphere, unchanged. There is remarkably little involvement of the left MCA territory cortex given the recent left MCA ischemic event. Electronically Signed   By: Gerome Samavid  Williams III M.D   On: 21-Apr-2017 19:02   Dg Chest Portable 1 View  Result Date: 09/20/17 CLINICAL DATA:  Altered mental status.  Recent CVA. EXAM: PORTABLE CHEST 1 VIEW COMPARISON:  Chest x-rays dated 10/16/2016 and 10/11/2016. FINDINGS: Patchy opacities are again seen throughout the right lung, however, there is an increasingly confluent opacity within the right mid lung region. Suspect small right pleural effusion. Left lung remains relatively clear. Heart size and mediastinal contours remain within normal limits. No acute or suspicious osseous finding. IMPRESSION: Persistent patchy opacities throughout the right lung, but with increasing confluent opacity in the right mid lung region. Differential includes asymmetric pulmonary edema, pneumonia and aspiration. Probable small right pleural effusion. Electronically Signed   By: Bary RichardStan  Maynard M.D.   On: 21-Apr-2017 18:31    EKG: Independently reviewed.  Vent. rate 125 BPM PR interval *  ms QRS duration 75 ms QT/QTc 329/475 ms P-R-T axes 64 189 55 Sinus tachycardia Right axis deviation Borderline T abnormalities, lateral leads No significant change since previous EKG  Assessment/Plan Principal Problem:   HCAP (healthcare-associated pneumonia) Admit to stepdown unit/inpatient Continue supplemental oxygen. Continue vancomycin per pharmacy Continue Zosyn per pharmacy Check sputum culture and Gram stain. Follow-up blood cultures and sensitivity. Check a strep pneumoniae urinary antigen.  Active Problems:   Hypernatremia Continue IV hydration. Follow-up sodium level in a.m.    AKI (acute kidney injury) (HCC) Continue IV hydration  Hyperlipidemia Hold atorvastatin Monitor LFTs.    Essential hypertension Continue metoprolol 12.5 mg by mouth twice a day    CVA (cerebral vascular accident) (HCC) Continue aspirin and Plavix. Continue supportive care.    Elevated troponin Continue monitoring. Trend troponin levels.    Type 2 diabetes mellitus with peripheral neuropathy (HCC) Carbohydrate modified diet   Continue Lantus 12 units SQ at bedtime. CBG monitoring with regular insulin sliding scale.       DVT prophylaxis: Lovenox Code Status: DNR/DNI Family Communication:  Disposition Plan: Admit to stepdown for IV antibiotic therapy for several days. Consults called:  Admission status: Inpatient/stepdown.   Bobette Mo MD [facility] Pager [phone] night-coverage [email or web address]  If 7PM-7AM, please contac Password [facility]  12/02/2016, 9:48 PM

## 2016-11-02 NOTE — ED Provider Notes (Signed)
[facility]-EMERGENCY DEPT Provider Note  CSN: [id number] Arrival date & time: 17-Nov-2016  1714  History   Chief Complaint Chief Complaint  Patient presents with  . Altered Mental Status   HPI Douglas [name] is a 76 y.o. male.  The history is provided by the EMS personnel and medical records. The history is limited by the condition of the patient. No language interpreter was used.  Illness  This is a new problem. The current episode started 6 to 12 hours ago. The problem occurs constantly. The problem has been gradually worsening. Nothing aggravates the symptoms. Nothing relieves the symptoms.    Past Medical History:  Diagnosis Date  . CKD (chronic kidney disease)   . DM (diabetes mellitus) (HCC)   . Hyperlipidemia   . Hypertension    Patient Active Problem List   Diagnosis Date Noted  . Stage 3 chronic kidney disease   . Global aphasia   . Hypernatremia   . Hypokalemia   . HCAP (healthcare-associated pneumonia)   . Sepsis (HCC)   . AKI (acute kidney injury) (HCC)   . Erythema   . Acute lower UTI   . Dysphagia due to recent cerebral infarction   . Hypoalbuminemia due to protein-calorie malnutrition (HCC)   . Thrombocytopenia (HCC)   . Leukocytosis   . Acute blood loss anemia   . Type 2 diabetes mellitus with peripheral neuropathy (HCC)   . Labile blood glucose   . 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 Right hemiparesis (HCC)   . Urinary retention   . Acute delirium   . CKD (chronic kidney disease) 09/27/2016  . Elevated troponin 09/27/2016  . Anemia 09/27/2016  . Acute embolic stroke (HCC)   . Acute systolic heart failure (HCC)   . Benign essential HTN   . Tachycardia   . CVA (cerebral vascular accident) (HCC) 09/26/2016  . Hyperlipidemia 11/30/2013  . Essential hypertension 11/30/2013  . DM (diabetes mellitus) (HCC) 11/30/2013   History reviewed. No pertinent surgical history.   Home Medications    Prior to Admission medications     Medication Sig Start Date End Date Taking? Authorizing Provider  acetaminophen (TYLENOL) 325 MG tablet Take 2 tablets (650 mg total) by mouth every 4 (four) hours as needed for mild pain (or temp > 37.5 C (99.5 F)). 10/05/16   Leana Roe Elgergawy, MD  amoxicillin-clavulanate (AUGMENTIN) 875-125 MG tablet Take 1 tablet by mouth every 12 (twelve) hours. Continue through 12/22, and then stop 10/05/16   Starleen Arms, MD  aspirin 325 MG tablet Take 1 tablet (325 mg total) by mouth daily. 10/06/16   Leana Roe Elgergawy, MD  atorvastatin (LIPITOR) 80 MG tablet Take 80 mg by mouth daily.    Historical Provider, MD  cholecalciferol (VITAMIN D) 1000 UNITS tablet Take 1,000 Units by mouth daily.    Historical Provider, MD  clopidogrel (PLAVIX) 75 MG tablet Take 1 tablet (75 mg total) by mouth daily. 10/06/16   Leana Roe Elgergawy, MD  ferrous sulfate 325 (65 FE) MG tablet Take 325 mg by mouth daily with breakfast.    Historical Provider, MD  insulin aspart (NOVOLOG) 100 UNIT/ML injection Inject 0-5 Units into the skin at bedtime. Patient taking differently: Inject 0-15 Units into the skin See admin instructions. Takes 0-15 units 3 times daily Takes 0-5 units at bedtime Sliding scale 10/05/16   Starleen Arms, MD  insulin glargine (LANTUS) 100 UNIT/ML injection Inject 0.12 mLs (12 Units total) into the skin daily.  10/06/16   Leana Roe Elgergawy, MD  levothyroxine (SYNTHROID, LEVOTHROID) 75 MCG tablet Take 75 mcg by mouth daily before breakfast.     Historical Provider, MD  metoprolol tartrate (LOPRESSOR) 25 MG tablet Take 0.5 tablets (12.5 mg total) by mouth 2 (two) times daily. 10/05/16   Leana Roe Elgergawy, MD  senna-docusate (SENOKOT-S) 8.6-50 MG tablet Take 1 tablet by mouth at bedtime as needed for mild constipation. 10/05/16   Starleen Arms, MD  Valproate Sodium (DEPAKENE) 250 MG/5ML SOLN solution Take 5 mLs (250 mg total) by mouth 2 (two) times daily. 10/05/16   Starleen Arms, MD    Family History Family History  Problem Relation Age of Onset  . Heart disease Mother   . Cancer Father   . Stroke Brother 20   Social History Social History  Substance Use Topics  . Smoking status: Former Smoker    Quit date: 06/30/1982  . Smokeless tobacco: Never Used     Comment: maybe not?  Marland Kitchen Alcohol use Yes     Comment: occasional    Allergies   Patient has no known allergies.  Review of Systems Review of Systems  Unable to perform ROS: Mental status change  Constitutional: Positive for fever.    Physical Exam Updated Vital Signs BP 140/72   Pulse 116   Temp 100.4 F (38 C) (Rectal)   Resp (!) 27   SpO2 99%   Physical Exam  Constitutional: No distress.  Elderly Caucasian male  HENT:  Head: Normocephalic and atraumatic.  Eyes: Pupils are equal, round, and reactive to light.  Neck: Neck supple.  Cardiovascular: Regular rhythm and normal heart sounds.   HR 120's, normotensive  Pulmonary/Chest: He is in respiratory distress.  RR mid 30's, sat's mid to high 80's on RA and high 90's on 3L [place], coarse breath sounds bilaterally  Abdominal: Soft. Bowel sounds are normal. He exhibits no distension.  Musculoskeletal: He exhibits no deformity.  Neurological: He displays normal reflexes.  Eye open to voice, incomprehensible sounds, withdrawals to pain  Skin: Skin is dry. Capillary refill takes 2 to 3 seconds. He is not diaphoretic. There is pallor.  Cool  Nursing note and vitals reviewed.   ED Treatments / Results  Labs (all labs ordered are listed, but only abnormal results are displayed) Labs Reviewed  COMPREHENSIVE METABOLIC PANEL - Abnormal; Notable for the following:       Result Value   Sodium 148 (*)    Chloride 116 (*)    CO2 20 (*)    Glucose, Bld 161 (*)    BUN 47 (*)    Creatinine, Ser 2.29 (*)    Calcium 8.6 (*)    Albumin 2.0 (*)    AST 92 (*)    Alkaline Phosphatase 421 (*)    Total Bilirubin 3.3 (*)    GFR calc non Af Amer 26 (*)     GFR calc Af Amer 30 (*)    All other components within normal limits  CBC WITH DIFFERENTIAL/PLATELET - Abnormal; Notable for the following:    WBC 18.2 (*)    Hemoglobin 12.0 (*)    RDW 20.9 (*)    Platelets 146 (*)    Neutro Abs 15.1 (*)    Monocytes Absolute 1.7 (*)    All other components within normal limits  URINALYSIS, ROUTINE W REFLEX MICROSCOPIC - Abnormal; Notable for the following:    Color, Urine AMBER (*)    APPearance CLOUDY (*)  Bilirubin Urine SMALL (*)    Protein, ur 30 (*)    Bacteria, UA RARE (*)    Squamous Epithelial / LPF 0-5 (*)    All other components within normal limits  PROTIME-INR - Abnormal; Notable for the following:    Prothrombin Time 15.7 (*)    All other components within normal limits  I-STAT CG4 LACTIC ACID, ED - Abnormal; Notable for the following:    Lactic Acid, Venous 2.96 (*)    All other components within normal limits  I-STAT VENOUS BLOOD GAS, ED - Abnormal; Notable for the following:    pH, Ven 7.474 (*)    pCO2, Ven 33.3 (*)    All other components within normal limits  I-STAT TROPOININ, ED - Abnormal; Notable for the following:    Troponin i, poc 0.21 (*)    All other components within normal limits  CULTURE, BLOOD (ROUTINE X 2)  CULTURE, BLOOD (ROUTINE X 2)  URINE CULTURE  APTT  BLOOD GAS, VENOUS   EKG  EKG Interpretation  Date/Time:  Thursday Nov 17, 2016 17:24:35 EST Ventricular Rate:  125 PR Interval:    QRS Duration: 75 QT Interval:  329 QTC Calculation: 475 R Axis:   -171 Text Interpretation:  Sinus tachycardia Right axis deviation Borderline T abnormalities, lateral leads No significant change since last tracing Confirmed by [facility] MD, ERIN ([id number]) on Nov 17, 2016 5:38:29 PM      Radiology Ct Head Wo Contrast  Result Date: 11/17/16 CLINICAL DATA:  Altered mental status.  Suspect sepsis. EXAM: CT HEAD WITHOUT CONTRAST TECHNIQUE: Contiguous axial images were obtained from the base of the skull through the  vertex without intravenous contrast. COMPARISON:  September 29, 2016 CT scan FINDINGS: Brain: No subdural, epidural, or subarachnoid hemorrhage. No intraparenchymal hemorrhage. White matter changes are identified, more marked on the left than the right. The hyperdense left MCA on the previous study is no longer visualized. Low-attenuation in the left cerebellar hemisphere is unchanged, consistent with infarct. There is remarkably little involvement of the left MCA territory cortex given the recent left MCA ischemic event. There is likely some involvement left frontal cortex such as on image 15. The ventricles and sulci are prominent and stable. No mass, mass effect, or midline shift. The basal cisterns are patent. The brainstem is normal. No evidence of acute ischemia or infarct. Vascular: No hyperdense vessel or unexpected calcification. Skull: Normal. Negative for fracture or focal lesion. Sinuses/Orbits: No acute finding. Other: None. IMPRESSION: 1. No bleed or evidence of acute ischemia. Infarcts are seen in the left frontal lobe and left cerebellar hemisphere, unchanged. There is remarkably little involvement of the left MCA territory cortex given the recent left MCA ischemic event. Electronically Signed   By: Gerome Sam III M.D   On: Nov 17, 2016 19:02   Dg Chest Portable 1 View  Result Date: Nov 17, 2016 CLINICAL DATA:  Altered mental status.  Recent CVA. EXAM: PORTABLE CHEST 1 VIEW COMPARISON:  Chest x-rays dated 10/16/2016 and 10/11/2016. FINDINGS: Patchy opacities are again seen throughout the right lung, however, there is an increasingly confluent opacity within the right mid lung region. Suspect small right pleural effusion. Left lung remains relatively clear. Heart size and mediastinal contours remain within normal limits. No acute or suspicious osseous finding. IMPRESSION: Persistent patchy opacities throughout the right lung, but with increasing confluent opacity in the right mid lung region.  Differential includes asymmetric pulmonary edema, pneumonia and aspiration. Probable small right pleural effusion. Electronically Signed   By: Weyman Croon  Linde GillisMaynard M.D.   On: 2017/02/25 18:31   Procedures Procedures  Medications Ordered in ED Medications  sodium chloride 0.9 % bolus 1,000 mL (0 mLs Intravenous Stopped 2017-03-23 1931)    And  sodium chloride 0.9 % bolus 1,000 mL (1,000 mLs Intravenous New Bag/Given 2017-03-23 1931)    And  sodium chloride 0.9 % bolus 250 mL (not administered)  piperacillin-tazobactam (ZOSYN) IVPB 3.375 g (not administered)  piperacillin-tazobactam (ZOSYN) IVPB 3.375 g (0 g Intravenous Stopped 2017-03-23 1901)  vancomycin (VANCOCIN) IVPB 1000 mg/200 mL premix (0 mg Intravenous Stopped 2017-03-23 1932)  aspirin suppository 300 mg (300 mg Rectal Given 2017-03-23 1911)    Initial Impression / Assessment and Plan / ED Course  I have reviewed the triage vital signs and the nursing notes.   76 y.o. male with above stated PMHx, HPI, and physical. History as above. Recent CVA admission. D/c'd to SNF x2 days ago. Now with AMS. EMS called. Given IVF's en route.  Rectal temp 100.58F. HR 120s. RR mid 30s. Normotensive. Concern for sepsis of unknown origin. Blood and urine cultures collected. Patient given IV fluids and broad-spectrum antibiotics with Vancomycin\Zosyn. Troponin elevated to 0.21 (appears to be less than previous levels in Dec 2017). Given rectal ASA. CXR concerning for new opacities.  Laboratory and imaging results were personally reviewed by myself and used in the medical decision making of this patient's treatment and disposition.  Pt admitted to medicine for further evaluation and management of sepsis with likely pulmonary source. Pt understands and agrees with the plan and has no further questions or concerns.   Pt care discussed with and followed by my attending, Dr. Jules SchickErin Schlossman  Nycere Presley, MD Pager [phone]  Final Clinical Impressions(s) / ED Diagnoses    Final diagnoses:  Altered mental status  Sepsis (HCC)  Leukocytosis  AKI (acute kidney injury) (HCC)  Hypernatremia  Hyperchloremia  Dehydration  Lactic acidosis  Elevated troponin   New Prescriptions New Prescriptions   No medications on file     [place] [address]-06-08 2007    Alvira MondayErin Schlossman, MD 11/09/16 [phone]

## 2016-11-03 DIAGNOSIS — J189 Pneumonia, unspecified organism: Secondary | ICD-10-CM

## 2016-11-03 LAB — COMPREHENSIVE METABOLIC PANEL
ALK PHOS: 302 U/L — AB (ref 38–126)
ALT: 46 U/L (ref 17–63)
AST: 63 U/L — AB (ref 15–41)
Albumin: 1.5 g/dL — ABNORMAL LOW (ref 3.5–5.0)
Anion gap: 8 (ref 5–15)
BUN: 42 mg/dL — AB (ref 6–20)
CALCIUM: 7.5 mg/dL — AB (ref 8.9–10.3)
CO2: 21 mmol/L — ABNORMAL LOW (ref 22–32)
CREATININE: 1.97 mg/dL — AB (ref 0.61–1.24)
Chloride: 119 mmol/L — ABNORMAL HIGH (ref 101–111)
GFR, EST AFRICAN AMERICAN: 37 mL/min — AB (ref >60)
GFR, EST NON AFRICAN AMERICAN: 31 mL/min — AB (ref >60)
Glucose, Bld: 170 mg/dL — ABNORMAL HIGH (ref 65–99)
Potassium: 3.6 mmol/L (ref 3.5–5.1)
Sodium: 148 mmol/L — ABNORMAL HIGH (ref 135–145)
Total Bilirubin: 2.6 mg/dL — ABNORMAL HIGH (ref 0.3–1.2)
Total Protein: 5.6 g/dL — ABNORMAL LOW (ref 6.5–8.1)

## 2016-11-03 LAB — GLUCOSE, CAPILLARY
GLUCOSE-CAPILLARY: 120 mg/dL — AB (ref 65–99)
Glucose-Capillary: 167 mg/dL — ABNORMAL HIGH (ref 65–99)
Glucose-Capillary: 220 mg/dL — ABNORMAL HIGH (ref 65–99)
Glucose-Capillary: 132 mg/dL — ABNORMAL HIGH (ref 65–99)

## 2016-11-03 LAB — CBC WITH DIFFERENTIAL/PLATELET
BASOS ABS: 0.1 10*3/uL (ref 0.0–0.1)
Basophils Relative: 1 %
Eosinophils Absolute: 0.1 10*3/uL (ref 0.0–0.7)
Eosinophils Relative: 0 %
HEMATOCRIT: 29.7 % — AB (ref 39.0–52.0)
HEMOGLOBIN: 8.9 g/dL — AB (ref 13.0–17.0)
LYMPHS ABS: 1 10*3/uL (ref 0.7–4.0)
LYMPHS PCT: 6 %
MCH: 27.8 pg (ref 26.0–34.0)
MCHC: 30 g/dL (ref 30.0–36.0)
MCV: 92.8 fL (ref 78.0–100.0)
Monocytes Absolute: 1.7 10*3/uL — ABNORMAL HIGH (ref 0.1–1.0)
Monocytes Relative: 10 %
NEUTROS ABS: 14.1 10*3/uL — AB (ref 1.7–7.7)
Neutrophils Relative %: 83 %
Platelets: 142 10*3/uL — ABNORMAL LOW (ref 150–400)
RBC: 3.2 MIL/uL — AB (ref 4.22–5.81)
RDW: 20.6 % — ABNORMAL HIGH (ref 11.5–15.5)
WBC: 17 10*3/uL — AB (ref 4.0–10.5)

## 2016-11-03 LAB — TROPONIN I
TROPONIN I: 0.26 ng/mL — AB (ref <0.03)
TROPONIN I: 0.18 ng/mL — AB (ref <0.03)

## 2016-11-03 LAB — MRSA PCR SCREENING: MRSA by PCR: NEGATIVE

## 2016-11-03 LAB — LACTIC ACID, PLASMA: Lactic Acid, Venous: 0.9 mmol/L (ref 0.5–1.9)

## 2016-11-03 LAB — STREP PNEUMONIAE URINARY ANTIGEN: Strep Pneumo Urinary Antigen: NEGATIVE

## 2016-11-03 MED ORDER — METOPROLOL TARTRATE 5 MG/5ML IV SOLN
2.5000 mg | Freq: Four times a day (QID) | INTRAVENOUS | Status: DC
  Administered 2016-11-03 – 2016-11-06 (×12): 2.5 mg via INTRAVENOUS
  Filled 2016-11-03 (×9): qty 5

## 2016-11-03 MED ORDER — VANCOMYCIN HCL IN DEXTROSE 750-5 MG/150ML-% IV SOLN: 750.0000 mg | INTRAVENOUS | Status: DC

## 2016-11-03 MED ORDER — SODIUM CHLORIDE 0.45 % IV SOLN
INTRAVENOUS | Status: DC
  Administered 2016-11-03 (×2): via INTRAVENOUS

## 2016-11-03 NOTE — Evaluation (Signed)
Clinical/Bedside Swallow Evaluation Patient Details  Name: Douglas [name] MRN: [id number] Date of Birth: 07/24/1941  Today's Date: 11/03/2016 Time: SLP Start Time (ACUTE ONLY): 1720 SLP Stop Time (ACUTE ONLY): 1740 SLP Time Calculation (min) (ACUTE ONLY): 20 min  Past Medical History:  Past Medical History:  Diagnosis Date  . CKD (chronic kidney disease)   . DM (diabetes mellitus) (HCC)   . Hyperlipidemia   . Hypertension    Past Surgical History: History reviewed. No pertinent surgical history. HPI:  Douglas [name] a 76 y.o.malewith medical history significant of CK-MB, type 2 diabetes, hyperlipidemia, hypertension who was discharged on 10/05/2016 due to acute embolic stroke. Pt with IP rehab stay 10/05/16-10/31/16 and is brought from the skilled nursing facility due to altered mental status and fever. Head CT 11/24/2016 showed: No bleed or evidence of acute ischemia. Infarcts are seen in the left frontal lobe and left cerebellar hemisphere, unchanged. Thereis remarkably little involvement of the left MCA territory cortex given the recent left MCA ischemic event. Chest CT 11-24-16 impressions: Persistent patchy opacities throughout the right lung, but with increasing confluent opacity in the right mid lung region. Differential includes asymmetric pulmonary edema, pneumonia and aspiration. Pt was seen by SLP during prior admission for Left MCA. MBS performed 10/13/16 with findings: moderately severe multifactorial dysphagia. Oral phase deficits are characterized by right sided weakness impacting containment and transit of boluses which lead to premature spillage of materials into the oropharynx. This in combination with decreased pharyngeal sensation, base of tongue weakness, lethargy, and poor attention to boluses in the setting of severe cognitive impairment results in a significantly delayed response with severe pooling in the pyriforms prior to initiation of swallow. Patient discharged  from IP rehab on dysphagia 1 diet with honey-thick liquids via teaspoon with max assist for use of swallowing precautions due to significant cognitive and motor planning impairment and moderately severe oropharyngeal dysphagia.   Assessment / Plan / Recommendation Clinical Impression  Patient presents with severe aspiration risk due to overt signs of aspiration observed at bedside  including immediate and delayed cough and elevated resiratory rate (36) with all PO trials (thin and honey-thick liquids, pureed). Pt with known moderate-severe oropharyngeal dysphagia following prior CVA, pneumonia, cognitive-linguistic deficits. Given clinical presentation, suspect further deterioration in swallow function from prior admission and recommend NPO status pending objective evaluation (MBS) tomorrow morning. Discussed this plan with patient, who nods in agreement, and RN. SLP will follow up with additional recommendations after completion of MBS. Patient would also benefit from referral for cognitive-linguistic evaluation for SLP in order to address aphasia and cognitive communication deficits.    Aspiration Risk  Severe aspiration risk;Risk for inadequate nutrition/hydration    Diet Recommendation NPO   Medication Administration: Via alternative means    Other  Recommendations Oral Care Recommendations: Oral care QID   Follow up Recommendations Skilled Nursing facility      Frequency and Duration min 2x/week  2 weeks       Prognosis Prognosis for Safe Diet Advancement: Fair Barriers to Reach Goals: Cognitive deficits;Language deficits;Severity of deficits      Swallow Study   General Date of Onset: Nov 24, 2016 HPI: Douglas [name] a 76 y.o.malewith medical history significant of CK-MB, type 2 diabetes, hyperlipidemia, hypertension who was discharged on 10/05/2016 due to acute embolic stroke. Pt with IP rehab stay 10/05/16-10/31/16 and is brought from the skilled nursing facility due to  altered mental status and fever. Head CT Nov 24, 2016 showed: No bleed or evidence of  acute ischemia. Infarcts are seen in the left frontal lobe and left cerebellar hemisphere, unchanged. Thereis remarkably little involvement of the left MCA territory cortex given the recent left MCA ischemic event. Chest CT 07/10/17 impressions: Persistent patchy opacities throughout the right lung, but with increasing confluent opacity in the right mid lung region. Differential includes asymmetric pulmonary edema, pneumonia and aspiration. Pt was seen by SLP during prior admission for Left MCA. MBS performed 10/13/16 with findings: moderately severe multifactorial dysphagia. Oral phase deficits are characterized by right sided weakness impacting containment and transit of boluses which lead to premature spillage of materials into the oropharynx. This in combination with decreased pharyngeal sensation, base of tongue weakness, lethargy, and poor attention to boluses in the setting of severe cognitive impairment results in a significantly delayed response with severe pooling in the pyriforms prior to initiation of swallow. Patient discharged from IP rehab on dysphagia 1 diet with honey-thick liquids via teaspoon with max assist for use of swallowing precautions due to significant cognitive and motor planning impairment and moderately severe oropharyngeal dysphagia. Type of Study: Bedside Swallow Evaluation Previous Swallow Assessment: see HPI Diet Prior to this Study: Regular;Thin liquids Temperature Spikes Noted: Yes (100.4) Respiratory Status: Nasal cannula History of Recent Intubation: No Behavior/Cognition: Alert;Cooperative Oral Cavity Assessment: Other (comment) (residuals of solid) Oral Care Completed by SLP: Yes Oral Cavity - Dentition: Missing dentition Vision: Functional for self-feeding Self-Feeding Abilities: Total assist Patient Positioning: Upright in bed Baseline Vocal Quality: Low vocal  intensity;Hoarse Volitional Cough: Cognitively unable to elicit Volitional Swallow: Unable to elicit    Oral/Motor/Sensory Function Overall Oral Motor/Sensory Function: Moderate impairment Facial ROM: Reduced right;Suspected CN VII (facial) dysfunction Facial Symmetry: Abnormal symmetry right;Suspected CN VII (facial) dysfunction Facial Strength: Reduced right;Suspected CN VII (facial) dysfunction Lingual ROM: Reduced right;Reduced left Lingual Strength: Reduced Velum: Within Functional Limits Mandible: Within Functional Limits   Ice Chips Ice chips: Within functional limits Presentation: Spoon   Thin Liquid Thin Liquid: Impaired Presentation: Cup;Spoon Oral Phase Impairments: Poor awareness of bolus;Reduced labial seal Oral Phase Functional Implications: Right anterior spillage Pharyngeal  Phase Impairments: Cough - Immediate;Suspected delayed Swallow    Nectar Thick Nectar Thick Liquid: Not tested   Honey Thick Honey Thick Liquid: Impaired Presentation: Spoon Pharyngeal Phase Impairments: Cough - Immediate;Cough - Delayed;Change in Vital Signs (elevated RR)   Puree Puree: Impaired Presentation: Spoon Oral Phase Impairments: Reduced labial seal Oral Phase Functional Implications: Oral residue Pharyngeal Phase Impairments: Cough - Immediate;Cough - Delayed;Change in Vital Signs (elevated RR ~36)   Solid   GO  Rondel BatonMary Beth Nehemiah Mcfarren, TennesseeMS CF-SLP Speech-Language Pathologist [phone] Solid: Not tested        Arlana LindauMary E Reginae Wolfrey 11/03/2016,5:52 PM

## 2016-11-03 NOTE — Progress Notes (Signed)
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 Remote tobacco EtOH HTN Hyperlipidemia CK D Baseline creatinine 1.6 Thrombocytopenia-negative for hit, autoimmune hemolysis, TTP  Recent hospital admission left MCA infarct status post inpatient rehabilitation stay 12/21-1/16--Hospital admission complicated by dysphagia, aspiration pneumonia as well as cystitis   Readmitted 2016-11-22 [facility] with altered mental status/fever and acute metabolic encephalopathy Found to have lactic acidosis 2.9 , white count 18 sodium 148 BUN/creatinine 47/2.29 and elevated alkaline phosphatase 421-chest x-ray = patchy infiltrates consistent with pneumonia Hemoglobin 12-->8.9 Found to have elevated troponin  Assessment & Plan:   Principal Problem:   HCAP (healthcare-associated pneumonia) Active Problems:   Hyperlipidemia   Essential hypertension   CVA (cerebral vascular accident) (HCC)   Elevated troponin   Type 2 diabetes mellitus with peripheral neuropathy (HCC)   AKI (acute kidney injury) (HCC)   Hypernatremia     Aspiration pneumonia given history of stroke etc.-continue broad spectrum antibiotics for now-narrow off of vancomycin to Zosyn monotherapy. Speech therapy consulted for input. White count down to 17 from 18, lactic acid 2.9--->0.9, follow sputum culture, strep pneumo Hypernatremia-continue 0.45 saline 100 cc per hour. Repeat labs a.m. Left MCA infarct with recent rehabilitation and hospital admission-dense deficit on the right side, can follow commands but is somewhat aphasic-continue Plavix 75 daily, continue Depakote 250 twice a day presumably for seizure prophylaxis Probable dilutional anemia-secondary to fluid repletion Elevated alkaline phosphatase-clear etiology. Monitor in a.m. Mildly elevated troponin-no huge  increase. Monitor Diabetes mellitus type 2 with neuropathy-continue to monitor-sugars are 160-170.Marland Kitchen Cont Lantus 12 U Hypothyroid-cont synthroid 75 mcg q am Mild tcp Hyperlipidemia hold statin for now   DVT prophylaxis: Lovenox Code Status: Full Family Communication: comm with neice Disposition Plan: inpatient   Consultants:   none  Procedures:   none  Antimicrobials:   vanc 1/18-/19  zosyn 1/18   Subjective: Aphasic Trying to communicate Can raise R arm Cannot do anything else currently  Objective: Vitals:   11/22/16 2349 11/03/16 0323 11/03/16 0400 11/03/16 0745  BP: 120/63 (!) 126/58  (!) 128/58  Pulse: (!) 102 (!) 108 98 (!) 102  Resp: (!) 28 (!) 30 (!) 30 (!) 26  Temp: (!) 100.4 F (38 C) 99.7 F (37.6 C)  98.8 F (37.1 C)  TempSrc: Axillary Axillary  Axillary  SpO2: 97% 98% 96% 94%  Weight:        Intake/Output Summary (Last 24 hours) at 11/03/16 0747 Last data filed at 11/03/16 0700  Gross per 24 hour  Intake             2475 ml  Output              725 ml  Net             1750 ml   Filed Weights   22-Nov-2016 2037  Weight: 66 kg (145 lb 8.1 oz)    Examination:  General exam: Appears calm and comfortable  Respiratory system: Clear to auscultation.  Cardiovascular system: S1 & S2 heard, RRR. No JVD, murmurs Gastrointestinal system: Abdomen is nondistended, soft and nontender.  foley Central nervous system: Alert, aphasic--can raise L arm.  Cannot follow commands Extremities: Symmetric 5 x 5 power. Skin: No rashes, lesions or ulcers Psychiatry: Judgement and insight appear normal. Mood & affect appropriate.     Data Reviewed: I have personally reviewed  following labs and imaging studies  CBC:  Recent Labs Lab 10/30/16 0658 11-08-16 1806 11/03/16 0247  WBC 8.6 18.2* 17.0*  NEUTROABS 5.5 15.1* 14.1*  HGB 10.6* 12.0* 8.9*  HCT 34.6* 39.7 29.7*  MCV 90.6 93.0 92.8  PLT 87* 146* 142*   Basic Metabolic Panel:  Recent Labs Lab  10/28/16 0515 10/30/16 0658 11/08/2016 1806 11/03/16 0247  NA  --  144 148* 148*  K  --  3.6 4.7 3.6  CL  --  112* 116* 119*  CO2  --  23 20* 21*  GLUCOSE  --  141* 161* 170*  BUN  --  25* 47* 42*  CREATININE  --  1.11 2.29* 1.97*  CALCIUM  --  8.3* 8.6* 7.5*  MG 1.7  --   --   --    GFR: Estimated Creatinine Clearance: 30.2 mL/min (by C-G formula based on SCr of 1.97 mg/dL (H)). Liver Function Tests:  Recent Labs Lab Nov 08, 2016 1806 11/03/16 0247  AST 92* 63*  ALT 57 46  ALKPHOS 421* 302*  BILITOT 3.3* 2.6*  PROT 6.7 5.6*  ALBUMIN 2.0* 1.5*   No results for input(s): LIPASE, AMYLASE in the last 168 hours. No results for input(s): AMMONIA in the last 168 hours. Coagulation Profile:  Recent Labs Lab 2016/11/08 1848  INR 1.24   Cardiac Enzymes:  Recent Labs Lab 2016/11/08 2238 11/03/16 0247  TROPONINI 0.29* 0.26*   BNP (last 3 results) No results for input(s): PROBNP in the last 8760 hours. HbA1C: No results for input(s): HGBA1C in the last 72 hours. CBG:  Recent Labs Lab 10/30/16 1644 10/30/16 2100 10/31/16 0634 10/31/16 1125 11-08-2016 2224  GLUCAP 176* 150* 169* 255* 156*   Lipid Profile: No results for input(s): CHOL, HDL, LDLCALC, TRIG, CHOLHDL, LDLDIRECT in the last 72 hours. Thyroid Function Tests: No results for input(s): TSH, T4TOTAL, FREET4, T3FREE, THYROIDAB in the last 72 hours. Anemia Panel: No results for input(s): VITAMINB12, FOLATE, FERRITIN, TIBC, IRON, RETICCTPCT in the last 72 hours. Sepsis Labs:  Recent Labs Lab 2016-11-08 1828 11/03/16 0247  LATICACIDVEN 2.96* 0.9    Recent Results (from the past 240 hour(s))  Culture, Urine     Status: None   Collection Time: 10/30/16  4:31 PM  Result Value Ref Range Status   Specimen Description URINE, RANDOM  Final   Special Requests NONE  Final   Culture NO GROWTH  Final   Report Status 10/31/2016 FINAL  Final         Radiology Studies: Ct Head Wo Contrast  Result Date:  11-08-2016 CLINICAL DATA:  Altered mental status.  Suspect sepsis. EXAM: CT HEAD WITHOUT CONTRAST TECHNIQUE: Contiguous axial images were obtained from the base of the skull through the vertex without intravenous contrast. COMPARISON:  September 29, 2016 CT scan FINDINGS: Brain: No subdural, epidural, or subarachnoid hemorrhage. No intraparenchymal hemorrhage. White matter changes are identified, more marked on the left than the right. The hyperdense left MCA on the previous study is no longer visualized. Low-attenuation in the left cerebellar hemisphere is unchanged, consistent with infarct. There is remarkably little involvement of the left MCA territory cortex given the recent left MCA ischemic event. There is likely some involvement left frontal cortex such as on image 15. The ventricles and sulci are prominent and stable. No mass, mass effect, or midline shift. The basal cisterns are patent. The brainstem is normal. No evidence of acute ischemia or infarct. Vascular: No hyperdense vessel or unexpected calcification. Skull: Normal. Negative  for fracture or focal lesion. Sinuses/Orbits: No acute finding. Other: None. IMPRESSION: 1. No bleed or evidence of acute ischemia. Infarcts are seen in the left frontal lobe and left cerebellar hemisphere, unchanged. There is remarkably little involvement of the left MCA territory cortex given the recent left MCA ischemic event. Electronically Signed   By: Gerome Samavid  Williams III M.D   On: Sep 18, 2017 19:02   Dg Chest Portable 1 View  Result Date: 03-Apr-2017 CLINICAL DATA:  Altered mental status.  Recent CVA. EXAM: PORTABLE CHEST 1 VIEW COMPARISON:  Chest x-rays dated 10/16/2016 and 10/11/2016. FINDINGS: Patchy opacities are again seen throughout the right lung, however, there is an increasingly confluent opacity within the right mid lung region. Suspect small right pleural effusion. Left lung remains relatively clear. Heart size and mediastinal contours remain within normal  limits. No acute or suspicious osseous finding. IMPRESSION: Persistent patchy opacities throughout the right lung, but with increasing confluent opacity in the right mid lung region. Differential includes asymmetric pulmonary edema, pneumonia and aspiration. Probable small right pleural effusion. Electronically Signed   By: Bary RichardStan  Maynard M.D.   On: Sep 18, 2017 18:31        Scheduled Meds: . aspirin  325 mg Oral Daily  . cholecalciferol  1,000 Units Oral Daily  . clopidogrel  75 mg Oral Daily  . enoxaparin (LOVENOX) injection  30 mg Subcutaneous Q24H  . ferrous sulfate  325 mg Oral Q breakfast  . insulin aspart  0-9 Units Subcutaneous TID WC  . insulin glargine  12 Units Subcutaneous QHS  . levothyroxine  75 mcg Oral QAC breakfast  . metoprolol tartrate  12.5 mg Oral BID  . piperacillin-tazobactam (ZOSYN)  IV  3.375 g Intravenous Q8H  . sodium chloride flush  3 mL Intravenous Q12H  . Valproate Sodium  250 mg Oral BID  . vancomycin  750 mg Intravenous Q12H   Continuous Infusions: . sodium chloride 100 mL/hr at 11/03/16 0248     LOS: 1 day    Time spent: 30    Rhetta MuraSAMTANI, JAI-GURMUKH, MD [facility] Pager [phone]  If 7PM-7AM, please contact night-coverage [email or web address] Password [email or web address] 11/03/2016, 7:47 AM

## 2016-11-03 NOTE — Progress Notes (Signed)
Pharmacy Antibiotic Note  Douglas [name] is a 76 y.o. male admitted on 11/06/2016 with sepsis.  Pharmacy has been consulted for Zosyn and vancomycin dosing.  Continues on broad spectrum abx for sepsis and probably HCAP. Strep pneumo antigen ordered.; Tmax of 100.4 yesterday, WBC elevated at 17. AKI. SCr elevated and up to 1.97, CrCl ~5930ml/min.  Plan: Continue Zosyn 3.375 gm IV q8h (4 hour infusion) Decrease vancomycin to 750mg  IV Q24 Monitor clinical picture, renal function, VT prn F/U C&S, abx deescalation / LOT  Weight: 145 lb 8.1 oz (66 kg)  Temp (24hrs), Avg:99.5 F (37.5 C), Min:98.3 F (36.8 C), Max:100.4 F (38 C)   Recent Labs Lab 10/30/16 0658 11/04/2016 1806 11/06/2016 1828 11/03/16 0247  WBC 8.6 18.2*  --  17.0*  CREATININE 1.11 2.29*  --  1.97*  LATICACIDVEN  --   --  2.96* 0.9    Estimated Creatinine Clearance: 30.2 mL/min (by C-G formula based on SCr of 1.97 mg/dL (H)).    No Known Allergies  Antimicrobials this admission:  Zosyn 1/18 >>  Vancomycin 1/18 >>   Dose adjustments this admission:  N/a  Microbiology results:  1/18 BCx: sent 1/18 UCx: sent  1/18 Sputum: sent  1/19 MRSA PCR: sent  Thank you for allowing pharmacy to be a part of this patient's care.  Armandina StammerBATCHELDER,Lyra Alaimo J 11/03/2016 9:10 AM

## 2016-11-04 LAB — URINE CULTURE: Culture: NO GROWTH

## 2016-11-04 LAB — COMPREHENSIVE METABOLIC PANEL
ALT: 68 U/L — ABNORMAL HIGH (ref 17–63)
ANION GAP: 9 (ref 5–15)
AST: 126 U/L — AB (ref 15–41)
Albumin: 1.4 g/dL — ABNORMAL LOW (ref 3.5–5.0)
Alkaline Phosphatase: 377 U/L — ABNORMAL HIGH (ref 38–126)
BILIRUBIN TOTAL: 2.2 mg/dL — AB (ref 0.3–1.2)
BUN: 35 mg/dL — AB (ref 6–20)
CALCIUM: 7.5 mg/dL — AB (ref 8.9–10.3)
CO2: 20 mmol/L — ABNORMAL LOW (ref 22–32)
Chloride: 119 mmol/L — ABNORMAL HIGH (ref 101–111)
Creatinine, Ser: 1.82 mg/dL — ABNORMAL HIGH (ref 0.61–1.24)
GFR calc Af Amer: 40 mL/min — ABNORMAL LOW (ref >60)
GFR, EST NON AFRICAN AMERICAN: 35 mL/min — AB (ref >60)
Glucose, Bld: 118 mg/dL — ABNORMAL HIGH (ref 65–99)
POTASSIUM: 3.5 mmol/L (ref 3.5–5.1)
Sodium: 148 mmol/L — ABNORMAL HIGH (ref 135–145)
TOTAL PROTEIN: 5.6 g/dL — AB (ref 6.5–8.1)

## 2016-11-04 LAB — CBC WITH DIFFERENTIAL/PLATELET
BASOS ABS: 0.1 10*3/uL (ref 0.0–0.1)
Basophils Relative: 1 %
EOS PCT: 4 %
Eosinophils Absolute: 0.7 10*3/uL (ref 0.0–0.7)
HCT: 29.4 % — ABNORMAL LOW (ref 39.0–52.0)
Hemoglobin: 8.7 g/dL — ABNORMAL LOW (ref 13.0–17.0)
LYMPHS PCT: 8 %
Lymphs Abs: 1.3 10*3/uL (ref 0.7–4.0)
MCH: 27.4 pg (ref 26.0–34.0)
MCHC: 29.6 g/dL — ABNORMAL LOW (ref 30.0–36.0)
MCV: 92.7 fL (ref 78.0–100.0)
Monocytes Absolute: 0.9 10*3/uL (ref 0.1–1.0)
Monocytes Relative: 6 %
Neutro Abs: 12.8 10*3/uL — ABNORMAL HIGH (ref 1.7–7.7)
Neutrophils Relative %: 81 %
PLATELETS: 169 10*3/uL (ref 150–400)
RBC: 3.17 MIL/uL — AB (ref 4.22–5.81)
RDW: 20.3 % — ABNORMAL HIGH (ref 11.5–15.5)
WBC: 15.8 10*3/uL — AB (ref 4.0–10.5)

## 2016-11-04 LAB — GLUCOSE, CAPILLARY
GLUCOSE-CAPILLARY: 98 mg/dL (ref 65–99)
GLUCOSE-CAPILLARY: 141 mg/dL — AB (ref 65–99)
GLUCOSE-CAPILLARY: 186 mg/dL — AB (ref 65–99)
Glucose-Capillary: 194 mg/dL — ABNORMAL HIGH (ref 65–99)

## 2016-11-04 LAB — MAGNESIUM: MAGNESIUM: 1.8 mg/dL (ref 1.7–2.4)

## 2016-11-04 MED ORDER — IPRATROPIUM BROMIDE 0.02 % IN SOLN
0.5000 mg | Freq: Four times a day (QID) | RESPIRATORY_TRACT | Status: DC | PRN
  Administered 2016-11-05 – 2016-11-06 (×3): 0.5 mg via RESPIRATORY_TRACT
  Filled 2016-11-04 (×3): qty 2.5

## 2016-11-04 MED ORDER — LEVOTHYROXINE SODIUM 100 MCG IV SOLR
37.5000 ug | Freq: Every day | INTRAVENOUS | Status: DC
  Administered 2016-11-04 – 2016-11-06 (×3): 37.5 ug via INTRAVENOUS
  Filled 2016-11-04 (×3): qty 5

## 2016-11-04 MED ORDER — ACETAMINOPHEN 650 MG RE SUPP
650.0000 mg | Freq: Once | RECTAL | Status: AC
  Administered 2016-11-04: 650 mg via RECTAL
  Filled 2016-11-04: qty 1

## 2016-11-04 MED ORDER — KCL IN DEXTROSE-NACL 40-5-0.45 MEQ/L-%-% IV SOLN
INTRAVENOUS | Status: DC
  Administered 2016-11-04 – 2016-11-05 (×2): via INTRAVENOUS
  Filled 2016-11-04 (×4): qty 1000

## 2016-11-04 MED ORDER — MAGNESIUM SULFATE IN D5W 1-5 GM/100ML-% IV SOLN
1.0000 g | Freq: Once | INTRAVENOUS | Status: AC
  Administered 2016-11-04: 1 g via INTRAVENOUS
  Filled 2016-11-04: qty 100

## 2016-11-04 MED ORDER — POTASSIUM PHOSPHATES 15 MMOLE/5ML IV SOLN
40.0000 meq | Freq: Once | INTRAVENOUS | Status: AC
  Administered 2016-11-04: 40 meq via INTRAVENOUS
  Filled 2016-11-04: qty 9.09

## 2016-11-04 MED ORDER — VALPROATE SODIUM 500 MG/5ML IV SOLN
250.0000 mg | Freq: Two times a day (BID) | INTRAVENOUS | Status: DC
  Administered 2016-11-04 – 2016-11-06 (×6): 250 mg via INTRAVENOUS
  Filled 2016-11-04 (×7): qty 2.5

## 2016-11-04 MED ORDER — ASPIRIN 300 MG RE SUPP
300.0000 mg | Freq: Every day | RECTAL | Status: DC
  Administered 2016-11-04 – 2016-11-06 (×3): 300 mg via RECTAL
  Filled 2016-11-04 (×3): qty 1

## 2016-11-04 MED ORDER — LEVALBUTEROL HCL 1.25 MG/0.5ML IN NEBU
1.2500 mg | INHALATION_SOLUTION | Freq: Four times a day (QID) | RESPIRATORY_TRACT | Status: DC | PRN
  Administered 2016-11-04 – 2016-11-06 (×4): 1.25 mg via RESPIRATORY_TRACT
  Filled 2016-11-04 (×4): qty 0.5

## 2016-11-04 NOTE — Progress Notes (Signed)
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 Remote tobacco EtOH HTN Hyperlipidemia CK D Baseline creatinine 1.6 Thrombocytopenia-negative for hit, autoimmune hemolysis, TTP  Recent hospital admission left MCA infarct status post inpatient rehabilitation stay 12/21-1/16--Hospital admission complicated by dysphagia, aspiration pneumonia as well as cystitis   Readmitted 10/21/2016 [facility] with altered mental status/fever and acute metabolic encephalopathy Found to have lactic acidosis 2.9 , white count 18 sodium 148 BUN/creatinine 47/2.29 and elevated alkaline phosphatase 421-chest x-ray = patchy infiltrates consistent with pneumonia Hemoglobin 12-->8.9 Found to have elevated troponin  Assessment & Plan:   Principal Problem:   HCAP (healthcare-associated pneumonia) Active Problems:   Hyperlipidemia   Essential hypertension   CVA (cerebral vascular accident) (HCC)   Elevated troponin   Type 2 diabetes mellitus with peripheral neuropathy (HCC)   AKI (acute kidney injury) (HCC)   Hypernatremia     Aspiration pneumonia given history of stroke etc.-continue broad spectrum antibiotics for now-narrow off of vancomycin to Zosyn monotherapy. Speech therapy feels high risk for oral intake and will reassess. Continue monitoring and is more alert may be able to test for dysphagia and appropriate.  White count has come down slightly Hypernatremia-change 0.45 saline-->D5 at 100 cc per hour. Repeat labs a.m. Episodic V. tach mild hypokalemia 3.5-5 beats this morning. Magnesium 1.8--replacing potassium with 40 mEq IV 1 Will also give magnesium Iv 1 gram AKI-Bun/crea ton admit 47/2.29.  Changing fluids as above-seems to be resolving a little bit--35/1.8` Left MCA infarct with recent rehabilitation and hospital admission-dense  deficit on the right side, can follow commands but is somewhat holding Plavix 75 daily, give aspirin suppository 300 daily continue Depakote 250 twice a day IV presumably for seizure prophylaxis--medications changed to IV 1/20 Probable dilutional anemia-secondary to fluid repletion--stable in the 8 range Elevated alkaline phosphatase-history of fatty liver/secondary hemachromatosis based on ultrasound 12/23 Alkaline phosphatase 300 400 range, AST and emergently 90-120, bilirubin slightly elevated 3.3-monitor for the present time Mildly elevated troponin-no huge increase. Monitor Diabetes mellitus type 2 with neuropathy-continue to monitor-sugars are 90-220. Cont Lantus 12 U.  Cont ssi Hypothyroid-cont synthroid 75 mcg q am Mild tcp-2/2 to Fatty liver Hyperlipidemia hold statin for now   DVT prophylaxis: Lovenox Code Status: Full Family Communication: comm with neice Disposition Plan: inpatient   Consultants:   none  Procedures:   none  Antimicrobials:   vanc 1/18-/19  zosyn 1/18   Subjective:  Doing fair overnight No new changes Nothing by mouth now as speech therapy felt unsafe to eat More able to respond and follows commands but still dense left-sided weakness Cannot obtain review of systems   Objective: Vitals:   11/04/16 0000 11/04/16 0315 11/04/16 0737 11/04/16 0800  BP: (!) 138/54 (!) 147/62 (!) 135/54 121/62  Pulse: (!) 110 (!) 108 91 89  Resp: (!) 29 (!) 30 (!) 28 (!) 27  Temp: 99.5 F (37.5 C) (!) 100.5 F (38.1 C) 98.3 F (36.8 C)   TempSrc: Axillary Axillary Axillary   SpO2: 93% 92% 96% 96%  Weight:        Intake/Output Summary (Last 24 hours) at 11/04/16 1004 Last data filed at 11/04/16 0740  Gross per 24 hour  Intake             1900 ml  Output  1400 ml  Net              500 ml   Filed Weights   10/17/2016 2037  Weight: 66 kg (145 lb 8.1 oz)    Examination:  General exam: Appears calm and comfortable Respiratory system: Clear  to auscultation Anteriorly and posteriorly Cardiovascular system: S1 & S2 heard, slightly tachycardic Gastrointestinal system: Abdomen is nondistended, soft and nontender.  foley Central nervous system: Alert, aphasic--can raise L arm.  foll commands better today 1/20--has dense hemiparesis on the right side Extremities: Symmetric 5 x 5 power. Skin: No rashes, lesions or ulcers Psychiatry: Not able to assess.     Data Reviewed: I have personally reviewed following labs and imaging studies  CBC:  Recent Labs Lab 10/30/16 0658 11/12/2016 1806 11/03/16 0247 11/04/16 0500  WBC 8.6 18.2* 17.0* 15.8*  NEUTROABS 5.5 15.1* 14.1* 12.8*  HGB 10.6* 12.0* 8.9* 8.7*  HCT 34.6* 39.7 29.7* 29.4*  MCV 90.6 93.0 92.8 92.7  PLT 87* 146* 142* 169   Basic Metabolic Panel:  Recent Labs Lab 10/30/16 0658 10/20/2016 1806 11/03/16 0247 11/04/16 0500  NA 144 148* 148* 148*  K 3.6 4.7 3.6 3.5  CL 112* 116* 119* 119*  CO2 23 20* 21* 20*  GLUCOSE 141* 161* 170* 118*  BUN 25* 47* 42* 35*  CREATININE 1.11 2.29* 1.97* 1.82*  CALCIUM 8.3* 8.6* 7.5* 7.5*  MG  --   --   --  1.8   GFR: Estimated Creatinine Clearance: 32.7 mL/min (by C-G formula based on SCr of 1.82 mg/dL (H)). Liver Function Tests:  Recent Labs Lab 10/21/2016 1806 11/03/16 0247 11/04/16 0500  AST 92* 63* 126*  ALT 57 46 68*  ALKPHOS 421* 302* 377*  BILITOT 3.3* 2.6* 2.2*  PROT 6.7 5.6* 5.6*  ALBUMIN 2.0* 1.5* 1.4*   No results for input(s): LIPASE, AMYLASE in the last 168 hours. No results for input(s): AMMONIA in the last 168 hours. Coagulation Profile:  Recent Labs Lab 11/08/2016 1848  INR 1.24   Cardiac Enzymes:  Recent Labs Lab 10/23/2016 2238 11/03/16 0247 11/03/16 1645  TROPONINI 0.29* 0.26* 0.18*   BNP (last 3 results) No results for input(s): PROBNP in the last 8760 hours. HbA1C: No results for input(s): HGBA1C in the last 72 hours. CBG:  Recent Labs Lab 11/03/16 0752 11/03/16 1133 11/03/16 1656  11/03/16 2205 11/04/16 0735  GLUCAP 167* 220* 132* 120* 98   Lipid Profile: No results for input(s): CHOL, HDL, LDLCALC, TRIG, CHOLHDL, LDLDIRECT in the last 72 hours. Thyroid Function Tests: No results for input(s): TSH, T4TOTAL, FREET4, T3FREE, THYROIDAB in the last 72 hours. Anemia Panel: No results for input(s): VITAMINB12, FOLATE, FERRITIN, TIBC, IRON, RETICCTPCT in the last 72 hours. Sepsis Labs:  Recent Labs Lab 11/09/2016 1828 11/03/16 0247  LATICACIDVEN 2.96* 0.9    Recent Results (from the past 240 hour(s))  Culture, Urine     Status: None   Collection Time: 10/30/16  4:31 PM  Result Value Ref Range Status   Specimen Description URINE, RANDOM  Final   Special Requests NONE  Final   Culture NO GROWTH  Final   Report Status 10/31/2016 FINAL  Final  Urine culture     Status: None   Collection Time: 11/04/2016  6:27 PM  Result Value Ref Range Status   Specimen Description URINE, CATHETERIZED  Final   Special Requests NONE  Final   Culture NO GROWTH  Final   Report Status 11/04/2016 FINAL  Final  Blood Culture (routine x 2)     Status: None (Preliminary result)   Collection Time: 15-Sep-2017 11:58 PM  Result Value Ref Range Status   Specimen Description BLOOD RIGHT WRIST  Final   Special Requests BOTTLES DRAWN AEROBIC AND ANAEROBIC 10ML  Final   Culture NO GROWTH < 12 HOURS  Final   Report Status PENDING  Incomplete  Blood Culture (routine x 2)     Status: None (Preliminary result)   Collection Time: 11/03/16 12:06 AM  Result Value Ref Range Status   Specimen Description BLOOD RIGHT HAND  Final   Special Requests IN PEDIATRIC BOTTLE 2ML  Final   Culture NO GROWTH < 12 HOURS  Final   Report Status PENDING  Incomplete  MRSA PCR Screening     Status: None   Collection Time: 11/03/16  7:24 AM  Result Value Ref Range Status   MRSA by PCR NEGATIVE NEGATIVE Final    Comment:        The GeneXpert MRSA Assay (FDA approved for NASAL specimens only), is one component of  a comprehensive MRSA colonization surveillance program. It is not intended to diagnose MRSA infection nor to guide or monitor treatment for MRSA infections.          Radiology Studies: Ct Head Wo Contrast  Result Date: 01-23-17 CLINICAL DATA:  Altered mental status.  Suspect sepsis. EXAM: CT HEAD WITHOUT CONTRAST TECHNIQUE: Contiguous axial images were obtained from the base of the skull through the vertex without intravenous contrast. COMPARISON:  September 29, 2016 CT scan FINDINGS: Brain: No subdural, epidural, or subarachnoid hemorrhage. No intraparenchymal hemorrhage. White matter changes are identified, more marked on the left than the right. The hyperdense left MCA on the previous study is no longer visualized. Low-attenuation in the left cerebellar hemisphere is unchanged, consistent with infarct. There is remarkably little involvement of the left MCA territory cortex given the recent left MCA ischemic event. There is likely some involvement left frontal cortex such as on image 15. The ventricles and sulci are prominent and stable. No mass, mass effect, or midline shift. The basal cisterns are patent. The brainstem is normal. No evidence of acute ischemia or infarct. Vascular: No hyperdense vessel or unexpected calcification. Skull: Normal. Negative for fracture or focal lesion. Sinuses/Orbits: No acute finding. Other: None. IMPRESSION: 1. No bleed or evidence of acute ischemia. Infarcts are seen in the left frontal lobe and left cerebellar hemisphere, unchanged. There is remarkably little involvement of the left MCA territory cortex given the recent left MCA ischemic event. Electronically Signed   By: Gerome Samavid  Williams III M.D   On: 004-10-18 19:02   Dg Chest Portable 1 View  Result Date: 01-23-17 CLINICAL DATA:  Altered mental status.  Recent CVA. EXAM: PORTABLE CHEST 1 VIEW COMPARISON:  Chest x-rays dated 10/16/2016 and 10/11/2016. FINDINGS: Patchy opacities are again seen  throughout the right lung, however, there is an increasingly confluent opacity within the right mid lung region. Suspect small right pleural effusion. Left lung remains relatively clear. Heart size and mediastinal contours remain within normal limits. No acute or suspicious osseous finding. IMPRESSION: Persistent patchy opacities throughout the right lung, but with increasing confluent opacity in the right mid lung region. Differential includes asymmetric pulmonary edema, pneumonia and aspiration. Probable small right pleural effusion. Electronically Signed   By: Bary RichardStan  Maynard M.D.   On: 004-10-18 18:31        Scheduled Meds: . aspirin  325 mg Oral Daily  . cholecalciferol  1,000  Units Oral Daily  . clopidogrel  75 mg Oral Daily  . enoxaparin (LOVENOX) injection  30 mg Subcutaneous Q24H  . ferrous sulfate  325 mg Oral Q breakfast  . insulin aspart  0-9 Units Subcutaneous TID WC  . insulin glargine  12 Units Subcutaneous QHS  . levothyroxine  75 mcg Oral QAC breakfast  . metoprolol  2.5 mg Intravenous Q6H  . piperacillin-tazobactam (ZOSYN)  IV  3.375 g Intravenous Q8H  . potassium phosphate IVPB (mEq)  40 mEq Intravenous Once  . sodium chloride flush  3 mL Intravenous Q12H  . Valproate Sodium  250 mg Oral BID   Continuous Infusions: . sodium chloride 100 mL/hr at 11/03/16 1713     LOS: 2 days    Time spent: 30    Rhetta Mura, MD [facility] Pager [phone]  If 7PM-7AM, please contact night-coverage [email or web address] Password [email or web address] 11/04/2016, 10:04 AM

## 2016-11-04 NOTE — Progress Notes (Signed)
Pt has foley. MD notified and put in order to not remove foley. Will continue to assess need and remove when appropriate.

## 2016-11-04 NOTE — Progress Notes (Signed)
Speech Language Pathology Treatment:    Patient Details Name: Douglas [name] MRN: [id number] DOB: 05/19/1941 Today's Date: 11/04/2016 Time: [id number] SLP Time Calculation (min) (ACUTE ONLY): 12 min  Assessment / Plan / Recommendation Clinical Impression  Patient with consistently elevated RR per RN and increased work of breathing. Recommend delaying MBS pending improved respiratory status. Patient is alert and follows basic commands. Performed oral care prior to attempting PO trials to determine appropriateness for administration of medications only. When presented with 1/2 teaspoon pureed texture, patient makes no effort to retrieve bolus despite max verbal and tactile cues. Ceased PO trials which do not appear appropriate at this time. Recommend continuing NPO with medications delivered via alternative means, oral care QID. SLP will f/u to determine readiness for instrumental assessment.    HPI HPI: Douglas CornfieldJames L Aydeletteis a 76 y.o.malewith medical history significant of CK-MB, type 2 diabetes, hyperlipidemia, hypertension who was discharged on 10/05/2016 due to acute embolic stroke. Pt with IP rehab stay 10/05/16-10/31/16 and is brought from the skilled nursing facility due to altered mental status and fever. Head CT 2017-09-15 showed: No bleed or evidence of acute ischemia. Infarcts are seen in the left frontal lobe and left cerebellar hemisphere, unchanged. Thereis remarkably little involvement of the left MCA territory cortex given the recent left MCA ischemic event. Chest CT 2017-09-15 impressions: Persistent patchy opacities throughout the right lung, but with increasing confluent opacity in the right mid lung region. Differential includes asymmetric pulmonary edema, pneumonia and aspiration. Pt was seen by SLP during prior admission for Left MCA. MBS performed 10/13/16 with findings: moderately severe multifactorial dysphagia. Oral phase deficits are characterized by right sided weakness impacting  containment and transit of boluses which lead to premature spillage of materials into the oropharynx. This in combination with decreased pharyngeal sensation, base of tongue weakness, lethargy, and poor attention to boluses in the setting of severe cognitive impairment results in a significantly delayed response with severe pooling in the pyriforms prior to initiation of swallow. Patient discharged from IP rehab on dysphagia 1 diet with honey-thick liquids via teaspoon with max assist for use of swallowing precautions due to significant cognitive and motor planning impairment and moderately severe oropharyngeal dysphagia.      SLP Plan  MBS;Continue with current plan of care (delay MBS due to respiratory status)     Recommendations  Diet recommendations: NPO Medication Administration: Via alternative means                Oral Care Recommendations: Oral care QID Follow up Recommendations: Skilled Nursing facility Plan: MBS;Continue with current plan of care (delay MBS due to respiratory status)       GO              Douglas BatonMary Beth Kasiah Manka, MS CF-SLP Speech-Language Pathologist [phone]  Arlana LindauMary E Douglas [name] 11/04/2016, 9:11 AM

## 2016-11-04 NOTE — Progress Notes (Signed)
MD notified notified about increase respirations 28-30 on 3lnc 91% PRN breathing neb order and will give. I will continue to monitor.

## 2016-11-04 NOTE — Progress Notes (Addendum)
Pt had 9 beat run of Vtach reported from tele. MD notified and put in orders. Will continue to monitor.

## 2016-11-05 ENCOUNTER — Inpatient Hospital Stay (HOSPITAL_COMMUNITY): Payer: Medicare Other

## 2016-11-05 DIAGNOSIS — Z515 Encounter for palliative care: Secondary | ICD-10-CM

## 2016-11-05 DIAGNOSIS — Z7189 Other specified counseling: Secondary | ICD-10-CM

## 2016-11-05 DIAGNOSIS — I639 Cerebral infarction, unspecified: Secondary | ICD-10-CM

## 2016-11-05 LAB — COMPREHENSIVE METABOLIC PANEL
ALBUMIN: 1.2 g/dL — AB (ref 3.5–5.0)
ALT: 60 U/L (ref 17–63)
ANION GAP: 7 (ref 5–15)
AST: 88 U/L — ABNORMAL HIGH (ref 15–41)
Alkaline Phosphatase: 395 U/L — ABNORMAL HIGH (ref 38–126)
BUN: 27 mg/dL — ABNORMAL HIGH (ref 6–20)
CALCIUM: 7.6 mg/dL — AB (ref 8.9–10.3)
CO2: 21 mmol/L — AB (ref 22–32)
CREATININE: 1.52 mg/dL — AB (ref 0.61–1.24)
Chloride: 117 mmol/L — ABNORMAL HIGH (ref 101–111)
GFR calc Af Amer: 50 mL/min — ABNORMAL LOW (ref >60)
GFR calc non Af Amer: 43 mL/min — ABNORMAL LOW (ref >60)
GLUCOSE: 220 mg/dL — AB (ref 65–99)
Potassium: 4 mmol/L (ref 3.5–5.1)
SODIUM: 145 mmol/L (ref 135–145)
TOTAL PROTEIN: 5.4 g/dL — AB (ref 6.5–8.1)
Total Bilirubin: 2.8 mg/dL — ABNORMAL HIGH (ref 0.3–1.2)

## 2016-11-05 LAB — MAGNESIUM: Magnesium: 1.9 mg/dL (ref 1.7–2.4)

## 2016-11-05 LAB — GLUCOSE, CAPILLARY
GLUCOSE-CAPILLARY: 134 mg/dL — AB (ref 65–99)
GLUCOSE-CAPILLARY: 158 mg/dL — AB (ref 65–99)
Glucose-Capillary: 186 mg/dL — ABNORMAL HIGH (ref 65–99)
Glucose-Capillary: 173 mg/dL — ABNORMAL HIGH (ref 65–99)

## 2016-11-05 MED ORDER — ENOXAPARIN SODIUM 40 MG/0.4ML ~~LOC~~ SOLN
40.0000 mg | SUBCUTANEOUS | Status: DC
  Administered 2016-11-05: 40 mg via SUBCUTANEOUS
  Filled 2016-11-05: qty 0.4

## 2016-11-05 NOTE — Progress Notes (Signed)
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 Remote tobacco EtOH HTN Hyperlipidemia CK D Baseline creatinine 1.6 Thrombocytopenia-negative for hit, autoimmune hemolysis, TTP  Recent hospital admission left MCA infarct status post inpatient rehabilitation stay 12/21-1/16--Hospital admission complicated by dysphagia, aspiration pneumonia as well as cystitis   Readmitted 007-15-2018 [facility] with altered mental status/fever and acute metabolic encephalopathy Found to have lactic acidosis 2.9 , white count 18 sodium 148 BUN/creatinine 47/2.29 and elevated alkaline phosphatase 421-chest x-ray = patchy infiltrates consistent with pneumonia Hemoglobin 12-->8.9 Found to have elevated troponin  Assessment & Plan:   Principal Problem:   HCAP (healthcare-associated pneumonia) Active Problems:   Hyperlipidemia   Essential hypertension   CVA (cerebral vascular accident) (HCC)   Elevated troponin   Type 2 diabetes mellitus with peripheral neuropathy (HCC)   AKI (acute kidney injury) (HCC)   Hypernatremia     Aspiration pneumonia given history of stroke etc.-continue broad spectrum antibiotics for now-narrow off of vancomycin to Zosyn monotherapy. Speech therapy feels high risk for oral intake and not able to cognitively/fjucntionally coordinate swallow-Palliative consulted after discussion with family Hypernatremia-change 0.45 saline-->D5 at 100 cc per hour. Sodium now 145 Episodic V. tach mild hypokalemia 3.5-5 beats this morning. Magnesium 1.8--replacing potassium with 40 mEq IV 1 Will also give magnesium Iv 1 gram-no recurrence other than pvc so far AKI-Bun/crea ton admit 47/2.29.  Changing fluids as above-seems to be resolving a little bit--35/1.8--?27/1.52 Left MCA infarct with recent rehabilitation and hospital  admission-dense deficit on the right side, can follow commands but is somewhat holding Plavix 75 daily, give aspirin suppository 300 daily continue Depakote 250 twice a day IV presumably for seizure prophylaxis--medications changed to IV 1/20 Probable dilutional anemia-secondary to fluid repletion--stable in the 8 range Elevated alkaline phosphatase-history of fatty liver/secondary hemachromatosis based on ultrasound 12/23 Alkaline phosphatase 300 400 range, AST and emergently 90-120, bilirubin slightly elevated 3.3-monitor for the present time Mildly elevated troponin-no huge increase. Monitor Diabetes mellitus type 2 with neuropathy-continue to monitor-sugars are 173-220. Cont Lantus 12 U.  Cont ssi Hypothyroid-cont synthroid 75 mcg q am Mild tcp-2/2 to Fatty liver Hyperlipidemia hold statin for now   DVT prophylaxis: Lovenox Code Status: Full Family Communication: comm with neice Disposition Plan: inpatient   Consultants:   none  Procedures:   none  Antimicrobials:   vanc 1/18-/19  zosyn 1/18   Subjective:  More awake but still dense deficit perisit-  Trying to communicate No cp no n/v   Objective: Vitals:   11/05/16 0324 11/05/16 0416 11/05/16 0701 11/05/16 1200  BP:   (!) 148/67 (!) 156/66  Pulse:   (!) 102 (!) 108  Resp:   (!) 27 (!) 28  Temp:  99 F (37.2 C) 98.5 F (36.9 C) 98.5 F (36.9 C)  TempSrc:  Axillary Axillary Oral  SpO2: 92%  92% 93%  Weight:        Intake/Output Summary (Last 24 hours) at 11/05/16 1447 Last data filed at 11/05/16 1200  Gross per 24 hour  Intake             1935 ml  Output             1375 ml  Net              560 ml   Filed Weights   Jan 27, 2017 2037  Weight: 66  kg (145 lb 8.1 oz)    Examination:  General exam: calm and comfortable Respiratory system: Clear to auscultation Anteriorly and posteriorly-slight tahcypnea Cardiovascular system: S1 & S2 heard, slightly tachycardic Gastrointestinal system: Abdomen is  nondistended, soft and nontender.  foley Central nervous system: Alert, aphasic--can raise L arm.  foll commands better today 1/21--has dense hemiparesis on the right side Extremities: Symmetric 5 x 5 power. Skin: No rashes, lesions or ulcers Psychiatry: Not able to assess.     Data Reviewed: I have personally reviewed following labs and imaging studies  CBC:  Recent Labs Lab 10/30/16 0658 10/18/2016 1806 11/03/16 0247 11/04/16 0500  WBC 8.6 18.2* 17.0* 15.8*  NEUTROABS 5.5 15.1* 14.1* 12.8*  HGB 10.6* 12.0* 8.9* 8.7*  HCT 34.6* 39.7 29.7* 29.4*  MCV 90.6 93.0 92.8 92.7  PLT 87* 146* 142* 169   Basic Metabolic Panel:  Recent Labs Lab 10/30/16 0658 10/22/2016 1806 11/03/16 0247 11/04/16 0500 11/05/16 0610  NA 144 148* 148* 148* 145  K 3.6 4.7 3.6 3.5 4.0  CL 112* 116* 119* 119* 117*  CO2 23 20* 21* 20* 21*  GLUCOSE 141* 161* 170* 118* 220*  BUN 25* 47* 42* 35* 27*  CREATININE 1.11 2.29* 1.97* 1.82* 1.52*  CALCIUM 8.3* 8.6* 7.5* 7.5* 7.6*  MG  --   --   --  1.8 1.9   GFR: Estimated Creatinine Clearance: 39.2 mL/min (by C-G formula based on SCr of 1.52 mg/dL (H)). Liver Function Tests:  Recent Labs Lab 10/22/2016 1806 11/03/16 0247 11/04/16 0500 11/05/16 0610  AST 92* 63* 126* 88*  ALT 57 46 68* 60  ALKPHOS 421* 302* 377* 395*  BILITOT 3.3* 2.6* 2.2* 2.8*  PROT 6.7 5.6* 5.6* 5.4*  ALBUMIN 2.0* 1.5* 1.4* 1.2*   No results for input(s): LIPASE, AMYLASE in the last 168 hours. No results for input(s): AMMONIA in the last 168 hours. Coagulation Profile:  Recent Labs Lab 11/01/2016 1848  INR 1.24   Cardiac Enzymes:  Recent Labs Lab 11/09/2016 2238 11/03/16 0247 11/03/16 1645  TROPONINI 0.29* 0.26* 0.18*   BNP (last 3 results) No results for input(s): PROBNP in the last 8760 hours. HbA1C: No results for input(s): HGBA1C in the last 72 hours. CBG:  Recent Labs Lab 11/04/16 1115 11/04/16 1653 11/04/16 2022 11/05/16 0808 11/05/16 1142  GLUCAP 141*  186* 194* 186* 173*   Lipid Profile: No results for input(s): CHOL, HDL, LDLCALC, TRIG, CHOLHDL, LDLDIRECT in the last 72 hours. Thyroid Function Tests: No results for input(s): TSH, T4TOTAL, FREET4, T3FREE, THYROIDAB in the last 72 hours. Anemia Panel: No results for input(s): VITAMINB12, FOLATE, FERRITIN, TIBC, IRON, RETICCTPCT in the last 72 hours. Sepsis Labs:  Recent Labs Lab 11/09/2016 1828 11/03/16 0247  LATICACIDVEN 2.96* 0.9    Recent Results (from the past 240 hour(s))  Culture, Urine     Status: None   Collection Time: 10/30/16  4:31 PM  Result Value Ref Range Status   Specimen Description URINE, RANDOM  Final   Special Requests NONE  Final   Culture NO GROWTH  Final   Report Status 10/31/2016 FINAL  Final  Urine culture     Status: None   Collection Time: 11/15/2016  6:27 PM  Result Value Ref Range Status   Specimen Description URINE, CATHETERIZED  Final   Special Requests NONE  Final   Culture NO GROWTH  Final   Report Status 11/04/2016 FINAL  Final  Blood Culture (routine x 2)     Status: None (  Preliminary result)   Collection Time: 10/17/2016 11:58 PM  Result Value Ref Range Status   Specimen Description BLOOD RIGHT WRIST  Final   Special Requests BOTTLES DRAWN AEROBIC AND ANAEROBIC  Final   Culture NO GROWTH 2 DAYS  Final   Report Status PENDING  Incomplete  Blood Culture (routine x 2)     Status: None (Preliminary result)   Collection Time: 11/03/16 12:06 AM  Result Value Ref Range Status   Specimen Description BLOOD RIGHT HAND  Final   Special Requests IN PEDIATRIC BOTTLE  Final   Culture NO GROWTH 2 DAYS  Final   Report Status PENDING  Incomplete  MRSA PCR Screening     Status: None   Collection Time: 11/03/16  7:24 AM  Result Value Ref Range Status   MRSA by PCR NEGATIVE NEGATIVE Final    Comment:        The GeneXpert MRSA Assay (FDA approved for NASAL specimens only), is one component of a comprehensive MRSA colonization surveillance  program. It is not intended to diagnose MRSA infection nor to guide or monitor treatment for MRSA infections.          Radiology Studies: Dg Swallowing Func-speech Pathology  Result Date: 11/05/2016 Objective Swallowing Evaluation: Type of Study: MBS-Modified Barium Swallow Study Patient Details Name: EDDRICK DILONE MRN: [id number] Date of Birth: 30-Sep-1941 Today's Date: 11/05/2016 Time: SLP Start Time (ACUTE ONLY): 0935-SLP Stop Time (ACUTE ONLY): 1000 SLP Time Calculation (min) (ACUTE ONLY): 25 min Past Medical History: Past Medical History: Diagnosis Date . CKD (chronic kidney disease)  . DM (diabetes mellitus) (HCC)  . Hyperlipidemia  . Hypertension  Past Surgical History: No past surgical history on file. HPI: RENDELL THIVIERGE a 76 y.o.malewith medical history significant of CK-MB, type 2 diabetes, hyperlipidemia, hypertension who was discharged on 10/05/2016 due to acute embolic stroke. Pt with IP rehab stay 10/05/16-10/31/16 and is brought from the skilled nursing facility due to altered mental status and fever. Head CT 11/06/2016 showed: No bleed or evidence of acute ischemia. Infarcts are seen in the left frontal lobe and left cerebellar hemisphere, unchanged. Thereis remarkably little involvement of the left MCA territory cortex given the recent left MCA ischemic event. Chest CT 11/15/2016 impressions: Persistent patchy opacities throughout the right lung, but with increasing confluent opacity in the right mid lung region. Differential includes asymmetric pulmonary edema, pneumonia and aspiration. Pt was seen by SLP during prior admission for Left MCA. MBS performed 10/13/16 with findings: moderately severe multifactorial dysphagia. Oral phase deficits are characterized by right sided weakness impacting containment and transit of boluses which lead to premature spillage of materials into the oropharynx. This in combination with decreased pharyngeal sensation, base of tongue weakness,  lethargy, and poor attention to boluses in the setting of severe cognitive impairment results in a significantly delayed response with severe pooling in the pyriforms prior to initiation of swallow. Patient discharged from IP rehab on dysphagia 1 diet with honey-thick liquids via teaspoon with max assist for use of swallowing precautions due to significant cognitive and motor planning impairment and moderately severe oropharyngeal dysphagia. Subjective: Limited verbal response, auditory comprehension appears WFL, follows basic commands Assessment / Plan / Recommendation CHL IP CLINICAL IMPRESSIONS 11/05/2016 Therapy Diagnosis Severe pharyngeal phase dysphagia;Severe oral phase dysphagia Clinical Impression Patient presents with severe oropharyngeal multifactorial dysphagia which appears worsened from prior admission when compared with most recent instrumental study. Oral deficits persist with right sided weakness from CVA impacting containment and transit  of boluses, in addition to generalized weakness, reduced sensation and awareness of boluses, weak lingual pumping which is insufficient for anterior to posterior bolus transfer. Provided max cues, varied bolus textures, assisted with left sided head tilt in attempt to facilitate oral manipulation and bolus transfer which were ineffective. Patient was unable to elicit a swallow, and material was removed from base of tongue via suction. Provided patient with multiple ice chips for tactile/thermal stimulation. Patient manipulates minimally. Some anterior bolus loss, however suspect spillover into pharynx with absent response: no swallow initiation or airway protection. Patient is alert throughout evaluation and responds with nods and verbalizations. Provided education regarding results of the assessment in telephone conversation with nephew and MD, and discussed treatment options including comfort measures, alternative means of temporary nutrition to see if cognitive  status improves, PEG placement. Recommending palliative consult due to poor prognosis, current aspiration pneumonia, severe aspiration risk, contraindictions for PEG placement with patient's cognitive status. SLP will follow up for continued education with family regarding goals of care. Continue NPO status at this time. Impact on safety and function Severe aspiration risk;Risk for inadequate nutrition/hydration   CHL IP TREATMENT RECOMMENDATION 11/05/2016 Treatment Recommendations Other (Comment)   Prognosis 11/05/2016 Prognosis for Safe Diet Advancement Guarded Barriers to Reach Goals Cognitive deficits;Language deficits;Severity of deficits Barriers/Prognosis Comment -- CHL IP DIET RECOMMENDATION 11/05/2016 SLP Diet Recommendations NPO Liquid Administration via -- Medication Administration Via alternative means Compensations -- Postural Changes --   CHL IP OTHER RECOMMENDATIONS 11/05/2016 Recommended Consults Other (Comment) Oral Care Recommendations Oral care QID Other Recommendations Have oral suction available   CHL IP FOLLOW UP RECOMMENDATIONS 11/05/2016 Follow up Recommendations Other (comment)   CHL IP FREQUENCY AND DURATION 11/05/2016 Speech Therapy Frequency (ACUTE ONLY) min 2x/week Treatment Duration 2 weeks      CHL IP ORAL PHASE 11/05/2016 Oral Phase Impaired Oral - Pudding Teaspoon -- Oral - Pudding Cup -- Oral - Honey Teaspoon Right anterior bolus loss;Weak lingual manipulation;Lingual pumping;Incomplete tongue to palate contact;Reduced posterior propulsion;Holding of bolus;Pocketing in anterior sulcus;Delayed oral transit;Decreased bolus cohesion Oral - Honey Cup -- Oral - Nectar Teaspoon -- Oral - Nectar Cup -- Oral - Nectar Straw -- Oral - Thin Teaspoon Right anterior bolus loss;Weak lingual manipulation;Lingual pumping;Incomplete tongue to palate contact;Reduced posterior propulsion;Holding of bolus;Pocketing in anterior sulcus;Lingual/palatal residue;Delayed oral transit;Decreased bolus cohesion Oral -  Thin Cup -- Oral - Thin Straw -- Oral - Puree Right anterior bolus loss;Weak lingual manipulation;Lingual pumping;Incomplete tongue to palate contact;Reduced posterior propulsion;Holding of bolus;Pocketing in anterior sulcus;Lingual/palatal residue;Delayed oral transit;Decreased bolus cohesion Oral - Mech Soft -- Oral - Regular -- Oral - Multi-Consistency -- Oral - Pill -- Oral Phase - Comment --  CHL IP PHARYNGEAL PHASE 11/05/2016 Pharyngeal Phase Impaired Pharyngeal- Pudding Teaspoon -- Pharyngeal -- Pharyngeal- Pudding Cup -- Pharyngeal -- Pharyngeal- Honey Teaspoon (No Data) Pharyngeal -- Pharyngeal- Honey Cup -- Pharyngeal -- Pharyngeal- Nectar Teaspoon -- Pharyngeal -- Pharyngeal- Nectar Cup -- Pharyngeal -- Pharyngeal- Nectar Straw -- Pharyngeal -- Pharyngeal- Thin Teaspoon (No Data) Pharyngeal -- Pharyngeal- Thin Cup -- Pharyngeal -- Pharyngeal- Thin Straw -- Pharyngeal -- Pharyngeal- Puree (No Data) Pharyngeal -- Pharyngeal- Mechanical Soft -- Pharyngeal -- Pharyngeal- Regular -- Pharyngeal -- Pharyngeal- Multi-consistency -- Pharyngeal -- Pharyngeal- Pill -- Pharyngeal -- Pharyngeal Comment --  CHL IP CERVICAL ESOPHAGEAL PHASE 11/05/2016 Cervical Esophageal Phase (No Data) Pudding Teaspoon -- Pudding Cup -- Honey Teaspoon -- Honey Cup -- Nectar Teaspoon -- Nectar Cup -- Nectar Straw -- Thin Teaspoon -- Thin Cup -- Thin  Straw -- Puree -- Mechanical Soft -- Regular -- Multi-consistency -- Pill -- Cervical Esophageal Comment -- Rondel Baton, MS CF-SLP Speech-Language Pathologist [phone] No flowsheet data found. Arlana Lindau 11/05/2016, 10:34 AM               Scheduled Meds: . aspirin  300 mg Rectal Daily  . cholecalciferol  1,000 Units Oral Daily  . enoxaparin (LOVENOX) injection  40 mg Subcutaneous Q24H  . insulin aspart  0-9 Units Subcutaneous TID WC  . insulin glargine  12 Units Subcutaneous QHS  . levothyroxine  37.5 mcg Intravenous QAC breakfast  . metoprolol  2.5 mg Intravenous Q6H  .  piperacillin-tazobactam (ZOSYN)  IV  3.375 g Intravenous Q8H  . sodium chloride flush  3 mL Intravenous Q12H  . valproate sodium  250 mg Intravenous Q12H   Continuous Infusions: . sodium chloride Stopped (11/04/16 1200)  . dextrose 5 % and 0.45 % NaCl with KCl 40 mEq/L 75 mL/hr at 11/05/16 0600     LOS: 3 days    Time spent: 30    Rhetta Mura, MD [facility] Pager [phone]  If 7PM-7AM, please contact night-coverage [email or web address] Password [facility] 11/05/2016, 2:47 PM

## 2016-11-05 NOTE — Progress Notes (Signed)
Modified Barium Swallow Progress Note  Patient Details  Name: Douglas [name] MRN: [id number] Date of Birth: 04/03/1941  Today's Date: 11/05/2016  Modified Barium Swallow completed.  Full report located under Chart Review in the Imaging Section.  Brief recommendations include the following:  Clinical Impression  Patient presents with severe oropharyngeal multifactorial dysphagia which appears worsened from prior admission when compared with most recent instrumental study. Oral deficits persist with right sided weakness from CVA impacting containment and transit of boluses, in addition to generalized weakness, reduced sensation and awareness of boluses, weak lingual pumping which is insufficient for anterior to posterior bolus transfer. Provided max cues, varied bolus textures, assisted with left sided head tilt in attempt to facilitate oral manipulation and bolus transfer which were ineffective. Patient was unable to elicit a swallow, and material was removed from base of tongue via suction. Provided patient with multiple ice chips for tactile/thermal stimulation. Patient manipulates minimally. Some anterior bolus loss, however suspect spillover into pharynx with absent response: no swallow initiation or airway protection. Patient is alert throughout evaluation and responds with nods and verbalizations. Provided education regarding results of the assessment in telephone conversation with nephew and MD, and discussed treatment options including comfort measures, alternative means of temporary nutrition to see if cognitive status improves, PEG placement. Recommending palliative consult due to poor prognosis, current aspiration pneumonia, severe aspiration risk, contraindictions for PEG placement with patient's cognitive status. SLP will follow up for continued education with family regarding goals of care. Continue NPO status at this time.   Swallow Evaluation Recommendations   Recommended Consults:  Other (Comment) (Palliative)   SLP Diet Recommendations: NPO       Medication Administration: Via alternative means               Oral Care Recommendations: Oral care QID   Other Recommendations: Have oral suction available  Rondel BatonMary Beth Birdella Sippel, MS CF-SLP Speech-Language Pathologist [phone]  Douglas [name] 11/05/2016,10:35 AM

## 2016-11-05 NOTE — Progress Notes (Signed)
Family does not want to pursue feeding tube.  Plan to continue current care for another 24-48 hours. If he worsens or does not improve, plan will be for comfort care with likely residential hospice placement.  Full consult to follow  Romie MinusGene Magdeline Prange, MD [facility] Team [phone]

## 2016-11-06 DIAGNOSIS — A419 Sepsis, unspecified organism: Principal | ICD-10-CM

## 2016-11-06 LAB — COMPREHENSIVE METABOLIC PANEL
ALT: 47 U/L (ref 17–63)
AST: 54 U/L — AB (ref 15–41)
Albumin: 1.3 g/dL — ABNORMAL LOW (ref 3.5–5.0)
Alkaline Phosphatase: 373 U/L — ABNORMAL HIGH (ref 38–126)
Anion gap: 6 (ref 5–15)
BUN: 19 mg/dL (ref 6–20)
CHLORIDE: 114 mmol/L — AB (ref 101–111)
CO2: 22 mmol/L (ref 22–32)
CREATININE: 1.32 mg/dL — AB (ref 0.61–1.24)
Calcium: 7.9 mg/dL — ABNORMAL LOW (ref 8.9–10.3)
GFR calc non Af Amer: 51 mL/min — ABNORMAL LOW (ref >60)
GFR, EST AFRICAN AMERICAN: 59 mL/min — AB (ref >60)
Glucose, Bld: 206 mg/dL — ABNORMAL HIGH (ref 65–99)
Potassium: 4.5 mmol/L (ref 3.5–5.1)
SODIUM: 142 mmol/L (ref 135–145)
Total Bilirubin: 2.6 mg/dL — ABNORMAL HIGH (ref 0.3–1.2)
Total Protein: 5.7 g/dL — ABNORMAL LOW (ref 6.5–8.1)

## 2016-11-06 LAB — CBC
HCT: 28.7 % — ABNORMAL LOW (ref 39.0–52.0)
Hemoglobin: 8.7 g/dL — ABNORMAL LOW (ref 13.0–17.0)
MCH: 27.7 pg (ref 26.0–34.0)
MCHC: 30.3 g/dL (ref 30.0–36.0)
MCV: 91.4 fL (ref 78.0–100.0)
PLATELETS: 199 10*3/uL (ref 150–400)
RBC: 3.14 MIL/uL — AB (ref 4.22–5.81)
RDW: 19.5 % — ABNORMAL HIGH (ref 11.5–15.5)
WBC: 12.4 10*3/uL — AB (ref 4.0–10.5)

## 2016-11-06 LAB — GLUCOSE, CAPILLARY
Glucose-Capillary: 185 mg/dL — ABNORMAL HIGH (ref 65–99)
Glucose-Capillary: 189 mg/dL — ABNORMAL HIGH (ref 65–99)
Glucose-Capillary: 144 mg/dL — ABNORMAL HIGH (ref 65–99)
Glucose-Capillary: 133 mg/dL — ABNORMAL HIGH (ref 65–99)

## 2016-11-06 MED ORDER — SODIUM CHLORIDE 0.9% FLUSH: 3.0000 mL | INTRAVENOUS | Status: DC | PRN

## 2016-11-06 MED ORDER — ONDANSETRON HCL 4 MG/2ML IJ SOLN: 4.0000 mg | Freq: Four times a day (QID) | INTRAMUSCULAR | Status: DC | PRN

## 2016-11-06 MED ORDER — HYDROMORPHONE HCL 1 MG/ML IJ SOLN
0.2000 mg | INTRAMUSCULAR | Status: DC | PRN
  Administered 2016-11-06: 0.2 mg via INTRAVENOUS
  Filled 2016-11-06: qty 1

## 2016-11-06 MED ORDER — ACETAMINOPHEN 325 MG PO TABS: 650.0000 mg | ORAL_TABLET | Freq: Four times a day (QID) | ORAL | Status: DC | PRN

## 2016-11-06 MED ORDER — SODIUM CHLORIDE 0.9 % IV SOLN
250.0000 mL | INTRAVENOUS | Status: DC | PRN
Start: 2016-11-06 — End: 2016-11-07

## 2016-11-06 MED ORDER — LORAZEPAM 2 MG/ML IJ SOLN
1.0000 mg | INTRAMUSCULAR | Status: DC | PRN
  Administered 2016-11-07: 1 mg via INTRAVENOUS
  Filled 2016-11-06: qty 1

## 2016-11-06 MED ORDER — ACETAMINOPHEN 650 MG RE SUPP
650.0000 mg | Freq: Four times a day (QID) | RECTAL | Status: DC | PRN
Start: 2016-11-06 — End: 2016-11-07

## 2016-11-06 MED ORDER — HYDROMORPHONE HCL 1 MG/ML IJ SOLN
0.2000 mg | INTRAMUSCULAR | Status: DC | PRN
Start: 2016-11-06 — End: 2016-11-07
  Administered 2016-11-07: 0.4 mg via INTRAVENOUS
  Filled 2016-11-06: qty 1

## 2016-11-06 MED ORDER — ONDANSETRON 4 MG PO TBDP
4.0000 mg | ORAL_TABLET | Freq: Four times a day (QID) | ORAL | Status: DC | PRN
  Filled 2016-11-06: qty 1

## 2016-11-06 MED ORDER — HALOPERIDOL LACTATE 2 MG/ML PO CONC
0.5000 mg | ORAL | Status: DC | PRN
  Filled 2016-11-06: qty 0.3

## 2016-11-06 MED ORDER — HALOPERIDOL LACTATE 5 MG/ML IJ SOLN: 0.5000 mg | INTRAMUSCULAR | Status: DC | PRN

## 2016-11-06 MED ORDER — HALOPERIDOL 0.5 MG PO TABS
0.5000 mg | ORAL_TABLET | ORAL | Status: DC | PRN
  Filled 2016-11-06: qty 1

## 2016-11-06 MED ORDER — BISACODYL 10 MG RE SUPP: 10.0000 mg | Freq: Every day | RECTAL | Status: DC | PRN

## 2016-11-06 MED ORDER — GLYCOPYRROLATE 0.2 MG/ML IJ SOLN: 0.2000 mg | INTRAMUSCULAR | Status: DC | PRN

## 2016-11-06 MED ORDER — GLYCOPYRROLATE 1 MG PO TABS: 1.0000 mg | ORAL_TABLET | ORAL | Status: DC | PRN

## 2016-11-06 MED ORDER — POLYVINYL ALCOHOL 1.4 % OP SOLN
1.0000 [drp] | Freq: Four times a day (QID) | OPHTHALMIC | Status: DC | PRN
  Filled 2016-11-06: qty 15

## 2016-11-06 MED ORDER — SODIUM CHLORIDE 0.9% FLUSH: 3.0000 mL | Freq: Two times a day (BID) | INTRAVENOUS | Status: DC

## 2016-11-06 MED ORDER — GLYCOPYRROLATE 0.2 MG/ML IJ SOLN
0.2000 mg | INTRAMUSCULAR | Status: DC | PRN
  Administered 2016-11-07: 0.2 mg via INTRAVENOUS
  Filled 2016-11-06: qty 1

## 2016-11-06 MED ORDER — BIOTENE DRY MOUTH MT LIQD: 15.0000 mL | OROMUCOSAL | Status: DC | PRN

## 2016-11-06 MED ORDER — LORAZEPAM 2 MG/ML PO CONC
1.0000 mg | ORAL | Status: DC | PRN
Start: 2016-11-06 — End: 2016-11-07

## 2016-11-06 MED ORDER — LORAZEPAM 1 MG PO TABS: 1.0000 mg | ORAL_TABLET | ORAL | Status: DC | PRN

## 2016-11-06 NOTE — Consult Note (Addendum)
Consultation Note Date: 11/06/2016   Patient Name: Douglas [name]  DOB: Aug 25, 1941  MRN: [id number]  Age / Sex: 76 y.o., male  PCP: Darcus Austin, MD Referring Physician: Nita Sells, MD  Reason for Consultation: Establishing goals of care  HPI/Patient Profile: 76 y.o. 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 Palliative consulted for goals of care   Clinical Assessment and Goals of Care: Met today with Clair Gulling, his nephew/HC POA Juleen China, his niece Izora Gala, and his long-term friend and roommate Annie Main.  We discussed his clinical course over the last month since he had a devastating stroke in December. His family is clear that the things that are most important to him or his family, his friends, and his independence.  We discussed pathways forward for his care including continuation of aggressive therapy, benefit in burden of artificial nutrition and hydration in light of his chronic illness, and possibility of refocus care on comfort measures understanding that time would be short.  They have a good understanding of his condition and the fact that he now has recurrent aspiration pneumonia in this is not a problem that is going to be fixed. Overall, his family is struggling to make sure that they do what Clair Gulling would desire in light of his new, devastating neurologic impairment.  Everyone seems clear that overall his independence is something that was very important to him and he would never find a long-term existence in a skilled facility to be acceptable.  HCPOA: Nephew, [name]   SUMMARY OF RECOMMENDATIONS   - I discussed at length with the family regarding Jim's stroke and subsequent neurologic deficits. This includes his dysphagia and recurrent aspiration. We discussed the fact that he has had two aspiration events in less than one  month's time. We also discussed the fact that continued aggressive care and rehab is not a likely pathway towards being well enough to be independent in getting back to his own home.  He had spoke with family in the past and was clear that he would not find living in a long-term facility to be acceptable quality of life.  We talked about the burden and benefit of artificial nutrition and hydration via tube feed and someone with dysphagia secondary to stroke who is now bedbound and continuing to have decline in functional status. - Family is clear that he would not want to pursue artificial nutrition nor PEG tube moving forward - Continue to monitor for another 24 hours without escalation of care in the event of decompensation. - If he does not continue to improve, his family is confident his desire would be to transition to full comfort care. - If he does continue to improve, we'll continue to have conversation regarding restarting diet understanding and he is going to aspirate again and working towards getting out of the hospital once he improves from this acute aspiration pneumonia. I recommendation to them as we begin to determine where he would best be served to live  out the end of his life understanding that another aspiration pneumonia will occur in short order. I recommended to them that he leave the hospital with hospice care wherever he discharges. - We are planning another family meeting via conference call tomorrow 5 PM  Code Status/Advance Care Planning:  DNR  Palliative Prophylaxis:   Aspiration, Bowel Regimen, Delirium Protocol and Frequent Pain Assessment  Additional Recommendations (Limitations, Scope, Preferences):  Continue current care, no escalation of care, reevaluated 24 hours  Psycho-social/Spiritual:   Desire for further Chaplaincy support: Did not address today  Additional Recommendations: Education on Hospice  Prognosis:   To be determined: If he continues to  worsen and focus is transitioned to comfort, he has an underlying pneumonia and is also going to continue to aspirate. His prognosis is less than 2 weeks and he would be well served by transition to residential hospice for end-of-life care. If he survives this hospitalization, he is going to aspirate again in short order. I think his prognosis is certainly less than 6 months of this disease follows his natural course and he should qualify for hospice support if so desired in a point in the future.  Discharge Planning: To Be Determined      Primary Diagnoses: Present on Admission: . HCAP (healthcare-associated pneumonia) . Hyperlipidemia . Type 2 diabetes mellitus with peripheral neuropathy (HCC) . Elevated troponin . Essential hypertension . Hypernatremia . AKI (acute kidney injury) ([place]) . CVA (cerebral vascular accident) ([place])   I have reviewed the medical record, interviewed the patient and family, and examined the patient. The following aspects are pertinent.  Past Medical History:  Diagnosis Date  . CKD (chronic kidney disease)   . DM (diabetes mellitus) ([place])   . Hyperlipidemia   . Hypertension    Social History   Social History  . Marital status: Single    Spouse name: N/A  . Number of children: N/A  . Years of education: N/A   Occupational History  . retired    Social History Main Topics  . Smoking status: Former Smoker    Quit date: 06/30/1982  . Smokeless tobacco: Never Used     Comment: maybe not?  Marland Kitchen Alcohol use Yes     Comment: occasional  . Drug use: No  . Sexual activity: Not Asked   Other Topics Concern  . None   Social History Narrative  . None   Family History  Problem Relation Age of Onset  . Heart disease Mother   . Cancer Father   . Stroke Brother 34   Scheduled Meds: . aspirin  300 mg Rectal Daily  . cholecalciferol  1,000 Units Oral Daily  . enoxaparin (LOVENOX) injection  40 mg Subcutaneous Q24H  . insulin aspart  0-9 Units  Subcutaneous TID WC  . insulin glargine  12 Units Subcutaneous QHS  . levothyroxine  37.5 mcg Intravenous QAC breakfast  . metoprolol  2.5 mg Intravenous Q6H  . piperacillin-tazobactam (ZOSYN)  IV  3.375 g Intravenous Q8H  . sodium chloride flush  3 mL Intravenous Q12H  . valproate sodium  250 mg Intravenous Q12H   Continuous Infusions: . dextrose 5 % and 0.45 % NaCl with KCl 40 mEq/L 75 mL/hr at 11/06/16 0401   PRN Meds:.acetaminophen, ipratropium, levalbuterol, [DISCONTINUED] ondansetron **OR** ondansetron (ZOFRAN) IV Medications Prior to Admission:  Prior to Admission medications   Medication Sig Start Date End Date Taking? Authorizing Provider  atorvastatin (LIPITOR) 80 MG tablet Take 80 mg by mouth every evening.  Yes Historical Provider, MD  citalopram (CELEXA) 10 MG tablet Take 10 mg by mouth daily.   Yes Historical Provider, MD  folic acid (FOLVITE) 1 MG tablet Take 1 mg by mouth daily.   Yes Historical Provider, MD  insulin aspart (NOVOLOG) 100 UNIT/ML injection Inject 0-5 Units into the skin at bedtime. Patient taking differently: Inject 14 Units into the skin 3 (three) times daily with meals.  10/05/16  Yes Silver Huguenin Elgergawy, MD  insulin glargine (LANTUS) 100 UNIT/ML injection Inject 0.12 mLs (12 Units total) into the skin daily. Patient taking differently: Inject 25 Units into the skin at bedtime.  10/06/16  Yes Albertine Patricia, MD  levothyroxine (SYNTHROID, LEVOTHROID) 75 MCG tablet Take 75 mcg by mouth daily before breakfast.    Yes Historical Provider, MD  Metoprolol Tartrate 75 MG TABS Take 75 mg by mouth 2 (two) times daily.   Yes Historical Provider, MD  pantoprazole sodium (PROTONIX) 40 mg/20 mL PACK Take 40 mg by mouth 2 (two) times daily.   Yes Historical Provider, MD  POTASSIUM PO Take 40 mEq by mouth See admin instructions. Liquid potassium 10 meq/5 ml conc. - take 20 mls (40 meq) by mouth daily mixed with honey thickened orange juice   Yes Historical Provider,  MD  senna-docusate (SENOKOT-S) 8.6-50 MG tablet Take 1 tablet by mouth at bedtime as needed for mild constipation. Patient taking differently: Take 2 tablets by mouth at bedtime.  10/05/16  Yes Albertine Patricia, MD  Valproate Sodium (DEPAKENE) 250 MG/5ML SOLN solution Take 5 mLs (250 mg total) by mouth 2 (two) times daily. Patient taking differently: Take 250 mg by mouth 2 (two) times daily. For behaviors 10/05/16  Yes Albertine Patricia, MD  acetaminophen (TYLENOL) 325 MG tablet Take 2 tablets (650 mg total) by mouth every 4 (four) hours as needed for mild pain (or temp > 37.5 C (99.5 F)). Patient not taking: Reported on 11/03/2016 10/05/16   Albertine Patricia, MD  aspirin 325 MG tablet Take 1 tablet (325 mg total) by mouth daily. Patient not taking: Reported on 11/03/2016 10/06/16   Silver Huguenin Elgergawy, MD  clopidogrel (PLAVIX) 75 MG tablet Take 1 tablet (75 mg total) by mouth daily. Patient not taking: Reported on 11/03/2016 10/06/16   Silver Huguenin Elgergawy, MD  metoprolol tartrate (LOPRESSOR) 25 MG tablet Take 0.5 tablets (12.5 mg total) by mouth 2 (two) times daily. Patient not taking: Reported on 11/03/2016 10/05/16   Albertine Patricia, MD   No Known Allergies Review of Systems  Unable to obtain  Physical Exam  General: Alert, awake, in no acute distress. Significant aphasia HEENT: No bruits, no goiter, no JVD Heart: Tachycardic. No murmur appreciated. Lungs: Fair air movement, scattered coarse Abdomen: Soft, nontender, nondistended, positive bowel sounds.  Ext: No significant edema Skin: Warm and dry Neuro: A phasic, moves left arm, follows commands  Vital Signs: BP (!) 154/73   Pulse (!) 118   Temp 98.8 F (37.1 C) (Axillary)   Resp (!) 35   Wt 66 kg (145 lb 8.1 oz)   SpO2 91%   BMI 20.88 kg/m  Pain Assessment: No/denies pain   Pain Score: 0-No pain   SpO2: SpO2: 91 % O2 Device:SpO2: 91 % O2 Flow Rate: .O2 Flow Rate (L/min): 5 L/min  IO: Intake/output summary:    Intake/Output Summary (Last 24 hours) at 11/06/16 0957 Last data filed at 11/06/16 0401  Gross per 24 hour  Intake  1653.75 ml  Output             1300 ml  Net           353.75 ml    LBM: Last BM Date: 11/04/16 Baseline Weight: Weight: 66 kg (145 lb 8.1 oz) Most recent weight: Weight: 66 kg (145 lb 8.1 oz)     Palliative Assessment/Data:   Flowsheet Rows   Flowsheet Row Most Recent Value  Intake Tab  Referral Department  Hospitalist  Unit at Time of Referral  Intermediate Care Unit  Palliative Care Primary Diagnosis  Sepsis/Infectious Disease  Date Notified  11/05/16  Palliative Care Type  New Palliative care  Reason for referral  Clarify Goals of Care, Counsel Regarding Hospice  Date of Admission  10/22/2016  Date first seen by Palliative Care  11/05/16  # of days Palliative referral response time  0 Day(s)  # of days IP prior to Palliative referral  3  Clinical Assessment  Palliative Performance Scale Score  20%  Pain Max last 24 hours  Not able to report  Pain Min Last 24 hours  Not able to report  Psychosocial & Spiritual Assessment  Palliative Care Outcomes  Patient/Family meeting held?  Yes  Who was at the meeting?  [facility] POA), niece, long-term roommate  Palliative Care Outcomes  Clarified goals of care      Time In: 1645 Time Out: 1815 Time Total: 90 Greater than 50%  of this time was spent counseling and coordinating care related to the above assessment and plan.  Signed by: Micheline Rough, MD   Please contact Palliative Medicine Team phone at [phone] for questions and concerns.  For individual provider: See Shea Evans

## 2016-11-06 NOTE — Care Management Important Message (Signed)
Important Message  Patient Details  Name: Douglas [name] MRN: [id number] Date of Birth: 04/08/1941   Medicare Important Message Given:  Yes    Kyla BalzarineShealy, Livvy Spilman Abena 11/06/2016, 1:52 PM

## 2016-11-06 NOTE — Progress Notes (Signed)
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 Remote tobacco EtOH HTN Hyperlipidemia CK D Baseline creatinine 1.6 Thrombocytopenia-negative for hit, autoimmune hemolysis, TTP  Recent hospital admission left MCA infarct status post inpatient rehabilitation stay 12/21-1/16--Hospital admission complicated by dysphagia, aspiration pneumonia as well as cystitis  Readmitted 02018/07/09 [facility] with altered mental status/fever and acute metabolic encephalopathy Found to have lactic acidosis 2.9 , white count 18 sodium 148 BUN/creatinine 47/2.29 and elevated alkaline phosphatase 421-chest x-ray = patchy infiltrates consistent with pneumonia Hemoglobin 12-->8.9 Found to have elevated troponin  Assessment & Plan:   Principal Problem:   HCAP (healthcare-associated pneumonia) Active Problems:   Hyperlipidemia   Essential hypertension   CVA (cerebral vascular accident) (HCC)   Elevated troponin   Type 2 diabetes mellitus with peripheral neuropathy (HCC)   AKI (acute kidney injury) (HCC)   Hypernatremia     Aspiration pneumonia given history of stroke etc.-continue broad spectrum antibiotics for now-narrow off of vancomycin to Zosyn monotherapy. Speech therapy feels high risk for oral intake and NPO-Palliative consulted after discussion with family Hypernatremia-change 0.45 saline-->D5 at 100 cc per hour. Sodium now 145-->142 Episodic V. tach mild hypokalemia 3.5-5 beats this morning. Magnesium 1.8--replacing potassium with 40 mEq IV 1 Will also give magnesium Iv 1 gram-no recurrence other than pvc so far--check Magnesium in am AKI-Bun/crea ton admit 47/2.29.  Changing fluids as above-seems to be resolving a little bit--35/1.8--?27/1.52--->19/1.32 Left MCA infarct with recent rehabilitation and hospital admission-dense  deficit on the right side, can follow commands. holding Plavix 75 daily, give aspirin suppository 300 daily continue Depakote 250 twice a day IV presumably for seizure prophylaxis--medications changed to IV 1/20 Probable dilutional anemia-secondary to fluid repletion--stable in the 8 range Elevated alkaline phosphatase-history of fatty liver/secondary hemachromatosis based on ultrasound 12/23 Alkaline phosphatase 300 400 range, AST and emergently 90-120, bilirubin slightly elevated 3.3-monitor for the present time Mildly elevated troponin-no huge increase. Monitor Diabetes mellitus type 2 with neuropathy-continue to monitor-sugars are 180-200. Cont Lantus 12 U.  Cont ssi Hypothyroid-cont synthroid 75 mcg q am Mild tcp-2/2 to Fatty liver Hyperlipidemia hold statin for now   DVT prophylaxis: Lovenox Code Status: Full Family Communication: comm with neice Disposition Plan: inpatient--likely will need HOSPICE.  discussing with family and P{allaitve care will follow up   Consultants:   none  Procedures:   none  Antimicrobials:   vanc 1/18-/19  zosyn 1/18   Subjective:  More awake but still dense deficit persist-   More tired and sleepy today No other issues  Objective: Vitals:   11/06/16 0800 11/06/16 0900 11/06/16 1000 11/06/16 1100  BP: 136/70 (!) 155/72 (!) 161/72 (!) 147/74  Pulse: (!) 103 (!) 108 (!) 118 91  Resp: (!) 30 (!) 32 (!) 30 (!) 32  Temp:    97.6 F (36.4 C)  TempSrc:    Axillary  SpO2: 92% 93% 92% 91%  Weight:        Intake/Output Summary (Last 24 hours) at 11/06/16 1340 Last data filed at 11/06/16 1124  Gross per 24 hour  Intake          1301.25 ml  Output             1475 ml  Net          -173.75 ml   Filed Weights   November 24, 2016 2037  Weight: 66  kg (145 lb 8.1 oz)    Examination:  General exam: calm and comfortable Respiratory system: slight tahcypnea Cardiovascular system: S1 & S2 heard, slightly tachycardic Gastrointestinal system:  Abdomen is nondistended, soft and nontender.  foley Central nervous system: Alert, aphasic--can raise L arm.  Not awake enough to assess mentation and commands Extremities: Symmetric 5 x 5 power. Skin: No rashes, lesions or ulcers Psychiatry: Not able to assess.     Data Reviewed: I have personally reviewed following labs and imaging studies  CBC:  Recent Labs Lab Nov 22, 2016 1806 11/03/16 0247 11/04/16 0500 11/06/16 0940  WBC 18.2* 17.0* 15.8* 12.4*  NEUTROABS 15.1* 14.1* 12.8*  --   HGB 12.0* 8.9* 8.7* 8.7*  HCT 39.7 29.7* 29.4* 28.7*  MCV 93.0 92.8 92.7 91.4  PLT 146* 142* 169 199   Basic Metabolic Panel:  Recent Labs Lab 11/22/2016 1806 11/03/16 0247 11/04/16 0500 11/05/16 0610 11/06/16 0940  NA 148* 148* 148* 145 142  K 4.7 3.6 3.5 4.0 4.5  CL 116* 119* 119* 117* 114*  CO2 20* 21* 20* 21* 22  GLUCOSE 161* 170* 118* 220* 206*  BUN 47* 42* 35* 27* 19  CREATININE 2.29* 1.97* 1.82* 1.52* 1.32*  CALCIUM 8.6* 7.5* 7.5* 7.6* 7.9*  MG  --   --  1.8 1.9  --    GFR: Estimated Creatinine Clearance: 45.1 mL/min (by C-G formula based on SCr of 1.32 mg/dL (H)). Liver Function Tests:  Recent Labs Lab 2016/11/22 1806 11/03/16 0247 11/04/16 0500 11/05/16 0610 11/06/16 0940  AST 92* 63* 126* 88* 54*  ALT 57 46 68* 60 47  ALKPHOS 421* 302* 377* 395* 373*  BILITOT 3.3* 2.6* 2.2* 2.8* 2.6*  PROT 6.7 5.6* 5.6* 5.4* 5.7*  ALBUMIN 2.0* 1.5* 1.4* 1.2* 1.3*   No results for input(s): LIPASE, AMYLASE in the last 168 hours. No results for input(s): AMMONIA in the last 168 hours. Coagulation Profile:  Recent Labs Lab 22-Nov-2016 1848  INR 1.24   Cardiac Enzymes:  Recent Labs Lab 2016/11/22 2238 11/03/16 0247 11/03/16 1645  TROPONINI 0.29* 0.26* 0.18*   BNP (last 3 results) No results for input(s): PROBNP in the last 8760 hours. HbA1C: No results for input(s): HGBA1C in the last 72 hours. CBG:  Recent Labs Lab 11/05/16 1142 11/05/16 1725 11/05/16 2108  11/06/16 0901 11/06/16 1148  GLUCAP 173* 134* 158* 185* 189*   Lipid Profile: No results for input(s): CHOL, HDL, LDLCALC, TRIG, CHOLHDL, LDLDIRECT in the last 72 hours. Thyroid Function Tests: No results for input(s): TSH, T4TOTAL, FREET4, T3FREE, THYROIDAB in the last 72 hours. Anemia Panel: No results for input(s): VITAMINB12, FOLATE, FERRITIN, TIBC, IRON, RETICCTPCT in the last 72 hours. Sepsis Labs:  Recent Labs Lab 2016-11-22 1828 11/03/16 0247  LATICACIDVEN 2.96* 0.9    Recent Results (from the past 240 hour(s))  Culture, Urine     Status: None   Collection Time: 10/30/16  4:31 PM  Result Value Ref Range Status   Specimen Description URINE, RANDOM  Final   Special Requests NONE  Final   Culture NO GROWTH  Final   Report Status 10/31/2016 FINAL  Final  Urine culture     Status: None   Collection Time: 11/22/16  6:27 PM  Result Value Ref Range Status   Specimen Description URINE, CATHETERIZED  Final   Special Requests NONE  Final   Culture NO GROWTH  Final   Report Status 11/04/2016 FINAL  Final  Blood Culture (routine x 2)  Status: None (Preliminary result)   Collection Time: 10/30/2016 11:58 PM  Result Value Ref Range Status   Specimen Description BLOOD RIGHT WRIST  Final   Special Requests BOTTLES DRAWN AEROBIC AND ANAEROBIC  Final   Culture NO GROWTH 2 DAYS  Final   Report Status PENDING  Incomplete  Blood Culture (routine x 2)     Status: None (Preliminary result)   Collection Time: 11/03/16 12:06 AM  Result Value Ref Range Status   Specimen Description BLOOD RIGHT HAND  Final   Special Requests IN PEDIATRIC BOTTLE  Final   Culture NO GROWTH 2 DAYS  Final   Report Status PENDING  Incomplete  MRSA PCR Screening     Status: None   Collection Time: 11/03/16  7:24 AM  Result Value Ref Range Status   MRSA by PCR NEGATIVE NEGATIVE Final    Comment:        The GeneXpert MRSA Assay (FDA approved for NASAL specimens only), is one component of  a comprehensive MRSA colonization surveillance program. It is not intended to diagnose MRSA infection nor to guide or monitor treatment for MRSA infections.          Radiology Studies: Dg Swallowing Func-speech Pathology  Result Date: 11/05/2016 Objective Swallowing Evaluation: Type of Study: MBS-Modified Barium Swallow Study Patient Details Name: Douglas [name] MRN: [id number] Date of Birth: Sep 10, 1941 Today's Date: 11/05/2016 Time: SLP Start Time (ACUTE ONLY): 0935-SLP Stop Time (ACUTE ONLY): 1000 SLP Time Calculation (min) (ACUTE ONLY): 25 min Past Medical History: Past Medical History: Diagnosis Date . CKD (chronic kidney disease)  . DM (diabetes mellitus) (HCC)  . Hyperlipidemia  . Hypertension  Past Surgical History: No past surgical history on file. HPI: WILMON CONOVER a 76 y.o.malewith medical history significant of CK-MB, type 2 diabetes, hyperlipidemia, hypertension who was discharged on 10/05/2016 due to acute embolic stroke. Pt with IP rehab stay 10/05/16-10/31/16 and is brought from the skilled nursing facility due to altered mental status and fever. Head CT 10/16/2016 showed: No bleed or evidence of acute ischemia. Infarcts are seen in the left frontal lobe and left cerebellar hemisphere, unchanged. Thereis remarkably little involvement of the left MCA territory cortex given the recent left MCA ischemic event. Chest CT 11/01/2016 impressions: Persistent patchy opacities throughout the right lung, but with increasing confluent opacity in the right mid lung region. Differential includes asymmetric pulmonary edema, pneumonia and aspiration. Pt was seen by SLP during prior admission for Left MCA. MBS performed 10/13/16 with findings: moderately severe multifactorial dysphagia. Oral phase deficits are characterized by right sided weakness impacting containment and transit of boluses which lead to premature spillage of materials into the oropharynx. This in combination with decreased  pharyngeal sensation, base of tongue weakness, lethargy, and poor attention to boluses in the setting of severe cognitive impairment results in a significantly delayed response with severe pooling in the pyriforms prior to initiation of swallow. Patient discharged from IP rehab on dysphagia 1 diet with honey-thick liquids via teaspoon with max assist for use of swallowing precautions due to significant cognitive and motor planning impairment and moderately severe oropharyngeal dysphagia. Subjective: Limited verbal response, auditory comprehension appears WFL, follows basic commands Assessment / Plan / Recommendation CHL IP CLINICAL IMPRESSIONS 11/05/2016 Therapy Diagnosis Severe pharyngeal phase dysphagia;Severe oral phase dysphagia Clinical Impression Patient presents with severe oropharyngeal multifactorial dysphagia which appears worsened from prior admission when compared with most recent instrumental study. Oral deficits persist with right sided weakness from CVA impacting containment  and transit of boluses, in addition to generalized weakness, reduced sensation and awareness of boluses, weak lingual pumping which is insufficient for anterior to posterior bolus transfer. Provided max cues, varied bolus textures, assisted with left sided head tilt in attempt to facilitate oral manipulation and bolus transfer which were ineffective. Patient was unable to elicit a swallow, and material was removed from base of tongue via suction. Provided patient with multiple ice chips for tactile/thermal stimulation. Patient manipulates minimally. Some anterior bolus loss, however suspect spillover into pharynx with absent response: no swallow initiation or airway protection. Patient is alert throughout evaluation and responds with nods and verbalizations. Provided education regarding results of the assessment in telephone conversation with nephew and MD, and discussed treatment options including comfort measures, alternative  means of temporary nutrition to see if cognitive status improves, PEG placement. Recommending palliative consult due to poor prognosis, current aspiration pneumonia, severe aspiration risk, contraindictions for PEG placement with patient's cognitive status. SLP will follow up for continued education with family regarding goals of care. Continue NPO status at this time. Impact on safety and function Severe aspiration risk;Risk for inadequate nutrition/hydration   CHL IP TREATMENT RECOMMENDATION 11/05/2016 Treatment Recommendations Other (Comment)   Prognosis 11/05/2016 Prognosis for Safe Diet Advancement Guarded Barriers to Reach Goals Cognitive deficits;Language deficits;Severity of deficits Barriers/Prognosis Comment -- CHL IP DIET RECOMMENDATION 11/05/2016 SLP Diet Recommendations NPO Liquid Administration via -- Medication Administration Via alternative means Compensations -- Postural Changes --   [facility] IP OTHER RECOMMENDATIONS 11/05/2016 Recommended Consults Other (Comment) Oral Care Recommendations Oral care QID Other Recommendations Have oral suction available   [facility] IP FOLLOW UP RECOMMENDATIONS 11/05/2016 Follow up Recommendations Other (comment)   CH[facility] IP FREQUENCY AND DURATION 11/05/2016 Speech Therapy Frequency (ACUTE ONLY) min 2x/week Treatment Duration 2 weeks      CHL IP ORAL PHASE 11/05/2016 Oral Phase Impaired Oral - Pudding Teaspoon -- Oral - Pudding Cup -- Oral - Honey Teaspoon Right anterior bolus loss;Weak lingual manipulation;Lingual pumping;Incomplete tongue to palate contact;Reduced posterior propulsion;Holding of bolus;Pocketing in anterior sulcus;Delayed oral transit;Decreased bolus cohesion Oral - Honey Cup -- Oral - Nectar Teaspoon -- Oral - Nectar Cup -- Oral - Nectar Straw -- Oral - Thin Teaspoon Right anterior bolus loss;Weak lingual manipulation;Lingual pumping;Incomplete tongue to palate contact;Reduced posterior propulsion;Holding of bolus;Pocketing in anterior sulcus;Lingual/palatal  residue;Delayed oral transit;Decreased bolus cohesion Oral - Thin Cup -- Oral - Thin Straw -- Oral - Puree Right anterior bolus loss;Weak lingual manipulation;Lingual pumping;Incomplete tongue to palate contact;Reduced posterior propulsion;Holding of bolus;Pocketing in anterior sulcus;Lingual/palatal residue;Delayed oral transit;Decreased bolus cohesion Oral - Mech Soft -- Oral - Regular -- Oral - Multi-Consistency -- Oral - Pill -- Oral Phase - Comment --  CH[facility] IP PHARYNGEAL PHASE 11/05/2016 Pharyngeal Phase Impaired Pharyngeal- Pudding Teaspoon -- Pharyngeal -- Pharyngeal- Pudding Cup -- Pharyngeal -- Pharyngeal- Honey Teaspoon (No Data) Pharyngeal -- Pharyngeal- Honey Cup -- Pharyngeal -- Pharyngeal- Nectar Teaspoon -- Pharyngeal -- Pharyngeal- Nectar Cup -- Pharyngeal -- Pharyngeal- Nectar Straw -- Pharyngeal -- Pharyngeal- Thin Teaspoon (No Data) Pharyngeal -- Pharyngeal- Thin Cup -- Pharyngeal -- Pharyngeal- Thin Straw -- Pharyngeal -- Pharyngeal- Puree (No Data) Pharyngeal -- Pharyngeal- Mechanical Soft -- Pharyngeal -- Pharyngeal- Regular -- Pharyngeal -- Pharyngeal- Multi-consistency -- Pharyngeal -- Pharyngeal- Pill -- Pharyngeal -- Pharyngeal Comment --  CH[facility] IP CERVICAL ESOPHAGEAL PHASE 11/05/2016 Cervical Esophageal Phase (No Data) Pudding Teaspoon -- Pudding Cup -- Honey Teaspoon -- Honey Cup -- Nectar Teaspoon -- Nectar Cup -- Nectar Straw -- Thin Teaspoon -- Thin Cup --  Thin Straw -- Puree -- Mechanical Soft -- Regular -- Multi-consistency -- Pill -- Cervical Esophageal Comment -- Rondel Baton, MS CF-SLP Speech-Language Pathologist [phone] No flowsheet data found. Arlana Lindau 11/05/2016, 10:34 AM               Scheduled Meds: . aspirin  300 mg Rectal Daily  . cholecalciferol  1,000 Units Oral Daily  . enoxaparin (LOVENOX) injection  40 mg Subcutaneous Q24H  . insulin aspart  0-9 Units Subcutaneous TID WC  . insulin glargine  12 Units Subcutaneous QHS  . levothyroxine  37.5 mcg Intravenous  QAC breakfast  . metoprolol  2.5 mg Intravenous Q6H  . piperacillin-tazobactam (ZOSYN)  IV  3.375 g Intravenous Q8H  . sodium chloride flush  3 mL Intravenous Q12H  . valproate sodium  250 mg Intravenous Q12H   Continuous Infusions: . dextrose 5 % and 0.45 % NaCl with KCl 40 mEq/L 75 mL/hr at 11/06/16 0401     LOS: 4 days    Time spent: 15    Rhetta Mura, MD [facility] Pager [phone]  If 7PM-7AM, please contact night-coverage [email or web address] Password [email or web address] 11/06/2016, 1:40 PM

## 2016-11-06 NOTE — Progress Notes (Signed)
Pharmacy Antibiotic Note  Douglas [name] is a 76 y.o. male admitted on December 01, 2016 with sepsis.  Pharmacy has been consulted for Zosyn dosing.  Continues on broad spectrum abx for aspiration PNA coverage. Afebrile, WBC 12.4 << 15.8, SCr 1.32, CrCl~40-50 ml/min. Doses remain appropriate for now. Noted palliative and goals of care are being discussed.   Plan: 1. Continue Zosyn 3.375g IV every 8 hours (infused over 4 hours) 2. Will continue to follow renal function, culture results, LOT, and antibiotic de-escalation plans   Weight: 145 lb 8.1 oz (66 kg)  Temp (24hrs), Avg:98.6 F (37 C), Min:97.6 F (36.4 C), Max:99.3 F (37.4 C)   Recent Labs Lab 05/08/2017 1806 05/08/2017 1828 11/03/16 0247 11/04/16 0500 11/05/16 0610 11/06/16 0940  WBC 18.2*  --  17.0* 15.8*  --  12.4*  CREATININE 2.29*  --  1.97* 1.82* 1.52* 1.32*  LATICACIDVEN  --  2.96* 0.9  --   --   --     Estimated Creatinine Clearance: 45.1 mL/min (by C-G formula based on SCr of 1.32 mg/dL (H)).    No Known Allergies  Antimicrobials this admission:  Zosyn 1/18 >>  Vancomycin 1/18 >> 1/19  Dose adjustments this admission:  N/a  Microbiology results:  1/18 Strep Pneumo Ag: neg 1/18 BCx: NGx1 1/18 UCx: NG 1/19 MRSA PCR: negative  Thank you for allowing pharmacy to be a part of this patient's care.  Georgina PillionElizabeth Carlie Solorzano, PharmD, BCPS Clinical Pharmacist Pager: [phone] 11/06/2016 1:09 PM

## 2016-11-07 DIAGNOSIS — Z515 Encounter for palliative care: Secondary | ICD-10-CM

## 2016-11-07 DIAGNOSIS — Z7189 Other specified counseling: Secondary | ICD-10-CM

## 2016-11-07 LAB — GLUCOSE, CAPILLARY: GLUCOSE-CAPILLARY: 154 mg/dL — AB (ref 65–99)

## 2016-11-08 LAB — CULTURE, BLOOD (ROUTINE X 2)
CULTURE: NO GROWTH
Culture: NO GROWTH

## 2016-11-16 NOTE — Progress Notes (Signed)
Daily Progress Note   Patient Name: Douglas [name]       Date: 10/25/2016 DOB: [birth year]  Age: 76 y.o. MRN#: [id number] Attending Physician: Rhetta Mura, MD Primary Care Physician: Hollice Espy, MD Admit Date: 11-23-2016  Reason for Consultation/Follow-up: Establishing goals of care, Non pain symptom management and Pain control  Subjective: Clinically appears worse today.  Lethargic and not interactive.  Increased WOB. Family meeting with his nephew, niece and friend via phone. See below.  Length of Stay: 5  Current Medications: Scheduled Meds:  . sodium chloride flush  3 mL Intravenous Q12H  . valproate sodium  250 mg Intravenous Q12H    Continuous Infusions:   PRN Meds: sodium chloride, acetaminophen **OR** acetaminophen, antiseptic oral rinse, bisacodyl, glycopyrrolate **OR** glycopyrrolate **OR** glycopyrrolate, haloperidol **OR** haloperidol **OR** haloperidol lactate, HYDROmorphone (DILAUDID) injection, ipratropium, levalbuterol, LORazepam **OR** LORazepam **OR** LORazepam, ondansetron **OR** ondansetron (ZOFRAN) IV, polyvinyl alcohol, sodium chloride flush  Physical Exam   General: lethargic, does not interact as he did yesterday, tachypnic and with increased WOB HEENT: No bruits, no goiter, no JVD Heart: Tachycardic, low grade murmur appreciated. Lungs: Tachypnic, coarse scattered Abdomen: Soft, nontender, nondistended, positive bowel sounds.  Ext: No significant edema Skin: Warm and dry Neuro: unable to assess today, lethargic and does not follow commands.       Vital Signs: BP (!) 149/71 (BP Location: Right Arm)   Pulse (!) 112   Temp 97.3 F (36.3 C) (Axillary)   Resp (!) 33   Wt 66 kg (145 lb 8.1 oz)   SpO2 91%   BMI 20.88 kg/m  SpO2: SpO2: 91  % O2 Device: O2 Device: Nasal Cannula O2 Flow Rate: O2 Flow Rate (L/min): 5 L/min  Intake/output summary:  Intake/Output Summary (Last 24 hours) at 11/14/2016 0537 Last data filed at 11/06/16 2309  Gross per 24 hour  Intake                0 ml  Output              750 ml  Net             -750 ml   LBM: Last BM Date: 11/04/16 Baseline Weight: Weight: 66 kg (145 lb 8.1 oz) Most recent weight: Weight: 66 kg (145 lb 8.1 oz)  Palliative Assessment/Data:    Flowsheet Rows   Flowsheet Row Most Recent Value  Intake Tab  Referral Department  Hospitalist  Unit at Time of Referral  Intermediate Care Unit  Palliative Care Primary Diagnosis  Sepsis/Infectious Disease  Date Notified  11/05/16  Palliative Care Type  New Palliative care  Reason for referral  Clarify Goals of Care, Counsel Regarding Hospice  Date of Admission  11/10/2016  Date first seen by Palliative Care  11/05/16  # of days Palliative referral response time  0 Day(s)  # of days IP prior to Palliative referral  3  Clinical Assessment  Palliative Performance Scale Score  20%  Pain Max last 24 hours  Not able to report  Pain Min Last 24 hours  Not able to report  Psychosocial & Spiritual Assessment  Palliative Care Outcomes  Patient/Family meeting held?  Yes  Who was at the meeting?  Nephew [facility] POA), niece, long-term roommate  Palliative Care Outcomes  Clarified goals of care      Patient Active Problem List   Diagnosis Date Noted  . Stage 3 chronic kidney disease   . Global aphasia   . Hypernatremia   . Hypokalemia   . HCAP (healthcare-associated pneumonia)   . Sepsis (HCC)   . AKI (acute kidney injury) (HCC)   . Erythema   . Acute lower UTI   . Dysphagia due to recent cerebral infarction   . Hypoalbuminemia due to protein-calorie malnutrition (HCC)   . Thrombocytopenia (HCC)   . Leukocytosis   . Acute blood loss anemia   . Type 2 diabetes mellitus with peripheral neuropathy (HCC)   . Labile blood  glucose   . 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 Right hemiparesis (HCC)   . Urinary retention   . Acute delirium   . CKD (chronic kidney disease) 09/27/2016  . Elevated troponin 09/27/2016  . Anemia 09/27/2016  . Acute embolic stroke (HCC)   . Acute systolic heart failure (HCC)   . Benign essential HTN   . Tachycardia   . CVA (cerebral vascular accident) (HCC) 09/26/2016  . Hyperlipidemia 11/30/2013  . Essential hypertension 11/30/2013  . DM (diabetes mellitus) (HCC) 11/30/2013    Palliative Care Assessment & Plan   Patient Profile: 77 y.o. male  with past medical history of Large stroke in December admitted on 10/23/2016 with current aspiration pneumonia. Palliative consulted for goals of care   Recommendations/Plan:  I called and discussed with patient family, including nephew/HCPOA [name]), Niece Harriett Sine), and long term friend/roommate Jeannett Senior).  They report talking with Dr Mahala Menghini earlier today, and he and I shared with them that he is clinically worsening.  His labs appear slightly better, but he is more lethargic, increased WOB, and this afternoon he did not look comfortable and I ordered dilaudid PRN.  Family clear and in agreement that desire moving forward is for comfort care only.  Comfort care orders updated per comfort care order set.  ONLY [name], [name], and STEPHEN TO GET MEDICAL INFORMATION.  No restrictions on visitation, however, medical information to only be shared with these three individuals.  Transfer to 6N for end of life care.  Began discussion with family that if he is stable enough to transfer, he would be well served by residential hospice.  They are clear that he is not to go back to SNF but are open to [facility] if necessary.  They declined to speak about this further today as stated preference would be for  hospital death and they want to get him comfortable and see how he is doing over next 24 hours prior to even discussing  discharge.  PMT to follow for symptom management and to help with dispo as appropriate.  Goals of Care and Additional Recommendations:  Limitations on Scope of Treatment: Full Comfort Care  Code Status:    Code Status Orders        Start     Ordered   11/06/16 1751  Do not attempt resuscitation (DNR)  Continuous    Question Answer Comment  In the event of cardiac or respiratory ARREST Do not call a "code blue"   In the event of cardiac or respiratory ARREST Do not perform Intubation, CPR, defibrillation or ACLS   In the event of cardiac or respiratory ARREST Use medication by any route, position, wound care, and other measures to relive pain and suffering. May use oxygen, suction and manual treatment of airway obstruction as needed for comfort.      11/06/16 1752    Code Status History    Date Active Date Inactive Code Status Order ID Comments User Context   10/30/2016  8:46 PM 11/06/2016  5:52 PM DNR [id number]  Bobette Moavid Manuel Ortiz, MD Inpatient   10/27/2016  1:52 PM 10/31/2016  8:08 PM DNR [id number]  Mcarthur Rossettianiel J Angiulli, PA-C Inpatient   10/05/2016  6:54 PM 10/05/2016  6:54 PM DNR [id number]  Charlton Amoraniel J Angiulli, PA-C Inpatient   10/05/2016  6:54 PM 10/27/2016  1:52 PM Full Code [id number]  Charlton Amoraniel J Angiulli, PA-C Inpatient   09/27/2016  1:23 AM 10/05/2016  6:35 PM DNR [id number]  Jonah BlueJennifer Yates, MD Inpatient    Advance Directive Documentation   Flowsheet Row Most Recent Value  Type of Advance Directive  Healthcare Power of Attorney  Pre-existing out of facility DNR order (yellow form or pink MOST form)  Yellow form placed in chart (order not valid for inpatient use)  "MOST" Form in Place?  No data       Prognosis:   < 2 weeks.  He has PNA and is clinically worsening (despite slight improvement in labs, he is clinically much worse today).  He is going to continue to aspirate.  With focus on comfort, his prognosis is likely less than 2 weeks if his disease follows its natural course.      Discharge Planning:  Hospice facility vs hospital death  Care plan was discussed with patient, nephew/HCPOA, Niece, friend/roommate Jeannett Senior(Stephen)  Thank you for allowing the Palliative Medicine Team to assist in the care of this patient.   Total Time 50 Prolonged Time Billed  No      Greater than 50%  of this time was spent counseling and coordinating care related to the above assessment and plan.  Romie MinusGene Darion Milewski, MD  Please contact Palliative Medicine Team phone at [phone] for questions and concerns.

## 2016-11-16 NOTE — Progress Notes (Signed)
Pt non responsive during am assessment. Heart rate and blood pressure began to drop. Pt passed at 0925. Dr Mahala MenghiniSamtani notified. POA being notified. [place] donor being notified. Marisue Ivanobyn Sylvan Lahm RN

## 2016-11-16 NOTE — Progress Notes (Signed)
Pt's family just left after visiting, pt will be taken to morgue. Family given number to call when they have decided on funeral home. Marisue Ivanobyn Shernita Rabinovich RN

## 2016-11-16 NOTE — Discharge Summary (Signed)
Death Summary  Daiton L Bink WUJ:[id number] DOB: 07/26/1941 DOA: 10/24/2016  PCP: Hollice EspyGATES,DONNA RUTH, MD PCP/Office notified: no-sent message via epic  Admit date: 10/25/2016 Date of Death: Jan 09, 2017  Final Diagnoses:  Principal Problem:   HCAP (healthcare-associated pneumonia) Active Problems:   Hyperlipidemia   Essential hypertension   CVA (cerebral vascular accident) (HCC)   Elevated troponin   Type 2 diabetes mellitus with peripheral neuropathy (HCC)   AKI (acute kidney injury) (HCC)   Hypernatremia   Goals of care, counseling/discussion   Palliative care encounter      History of present illness:  year-old male Diabetes mellitus type 2 + peripheral neuropathy Remote tobacco EtOH HTN Hyperlipidemia CK D Baseline creatinine 1.6 Thrombocytopenia-negative for hit, autoimmune hemolysis, TTP  Recent hospital admission left MCA infarct status post inpatient rehabilitation stay 12/21-1/16--Hospital admission complicated by dysphagia, aspiration pneumonia as well as cystitis  Readmitted 11/10/2016 [facility] with altered mental status/fever and acute metabolic encephalopathy Found to have lactic acidosis 2.9 , white count 18 sodium 148 BUN/creatinine 47/2.29 and elevated alkaline phosphatase 421-chest x-ray = patchy infiltrates consistent with pneumonia Hemoglobin 12-->8.9 Found to have elevated troponin  Hospital Course:  Patient was seen and admitted and started on broad-spectrum bank and Zosyn felt high risk for diet and kept nothing by mouth by speech therapy Hospital course complicated by prerenal azotemia and hypernatremia as well as episodic V. tach noted patient did not make much of a cognitive improvement was not able to cooperate with speech therapy for initiation of a diet long discussions were held with family and patient was made full comfort care 11/06/2016 Patient noted to be increasingly less responsive to low heart rate and increased work of  breathing morning 0Mar 27, 2018 and found to have deceased 0Mar 27, 2018 0925.  Inpatient no chest rise very reflexes and no heart rate Condolences expressed and certificate filled out  Time: 35  Signed:  Rhetta MuraSAMTANI, JAI-GURMUKH  [facility] Jan 09, 2017, 10:11 AM

## 2016-11-16 DEATH — deceased

## 2017-07-13 IMAGING — CT CT HEAD W/O CM
3 series · 15 of 47 positions shown, 18 images · non-contrast
Comparison: September 29, 2016 CT scan

CLINICAL DATA: Altered mental status.  Suspect sepsis.

EXAM:
CT HEAD WITHOUT CONTRAST
TECHNIQUE: Contiguous axial images were obtained from the base of the skull
through the vertex without intravenous contrast.

[Series 2: head 5.0 h30s · axial · 0.41mm/px · z∈[-103,+27]mm · 9 of 32 slices shown, 12 images]
[im 3/32  brain]
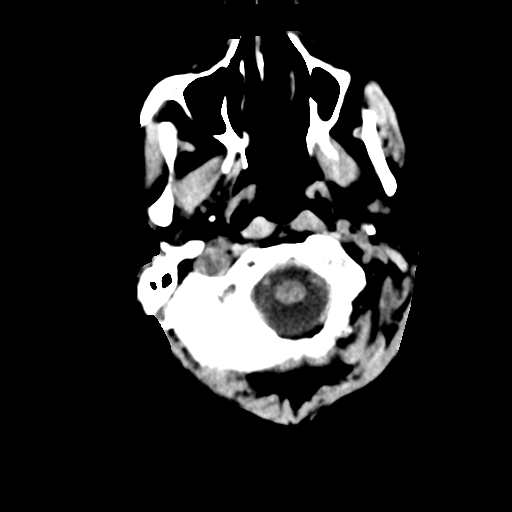
[im 3/32  bone]
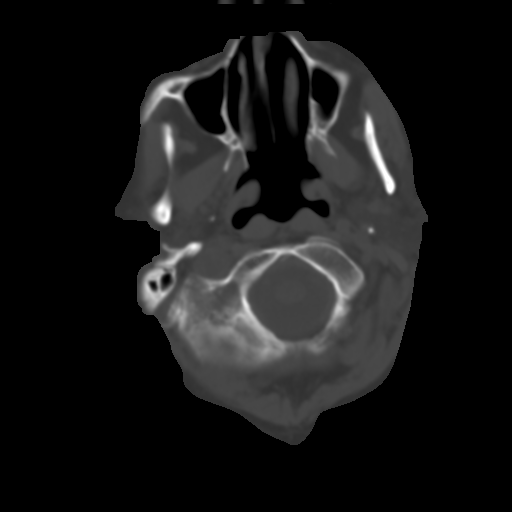
[im 6/32  brain]
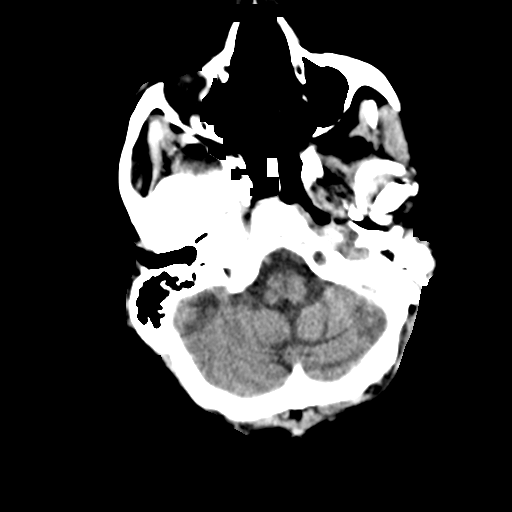
[im 9/32  brain]
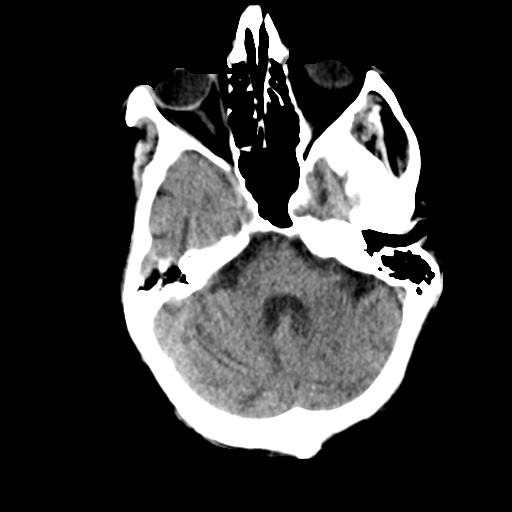
[im 12/32  brain]
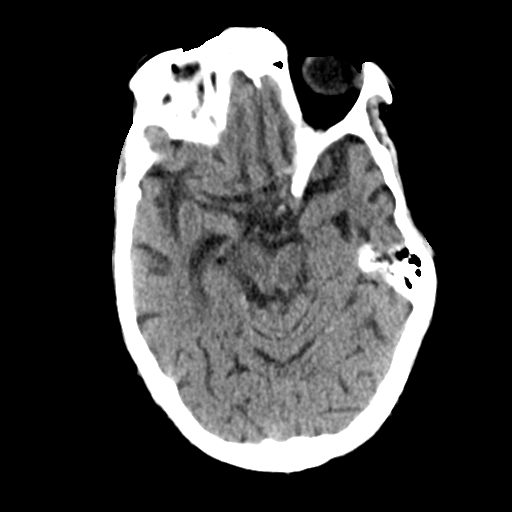
[im 17/32  brain]
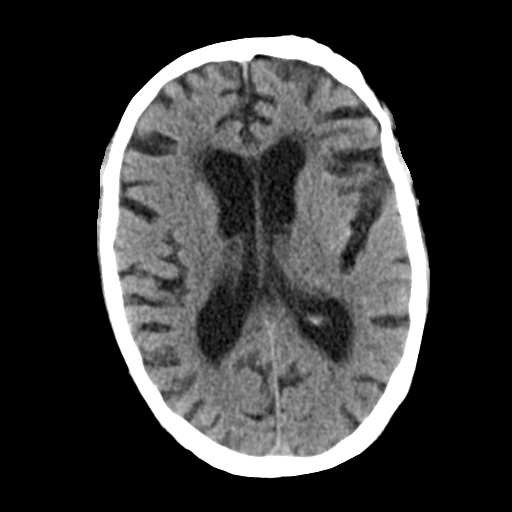
[im 17/32  bone]
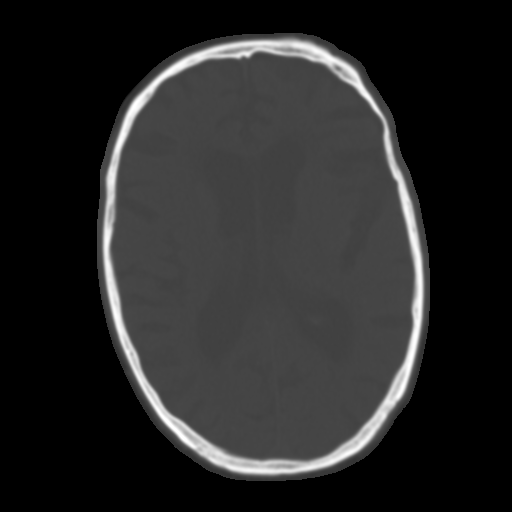
[im 20/32  brain]
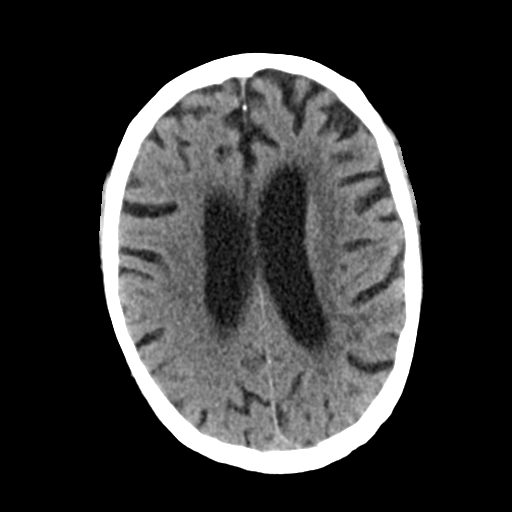
[im 23/32  brain]
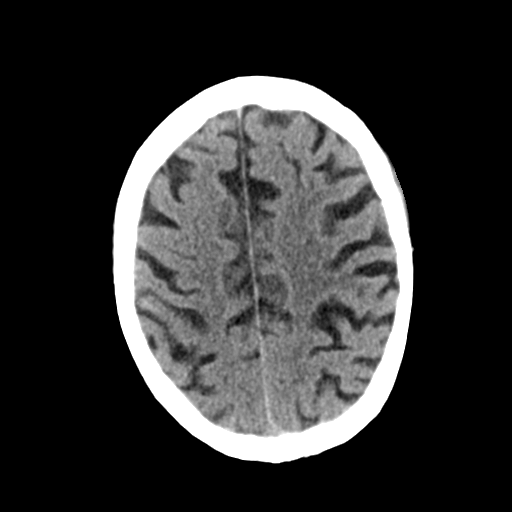
[im 26/32  brain]
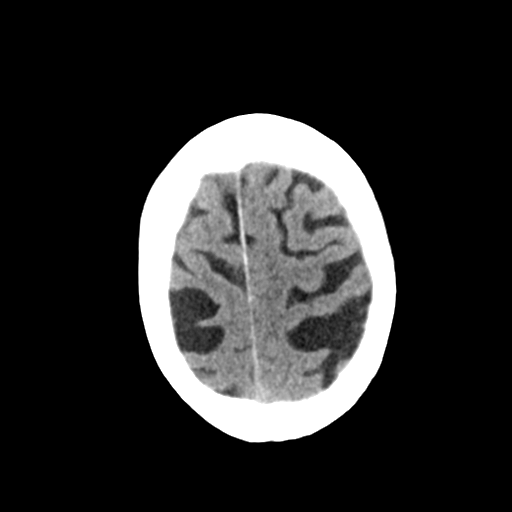
[im 29/32  brain]
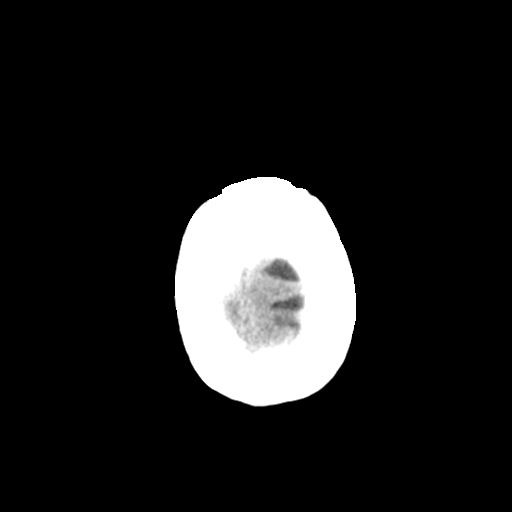
[im 29/32  bone]
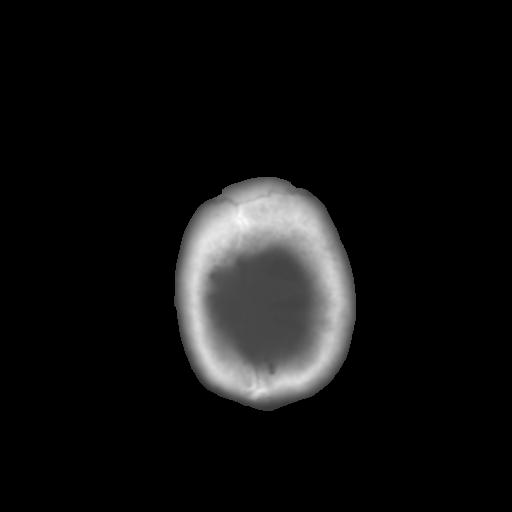

[Series 4: head 3.0 mpr cor · coronal · 0.32mm/px · 3 of 67 slices shown]
[im 23/67  brain]
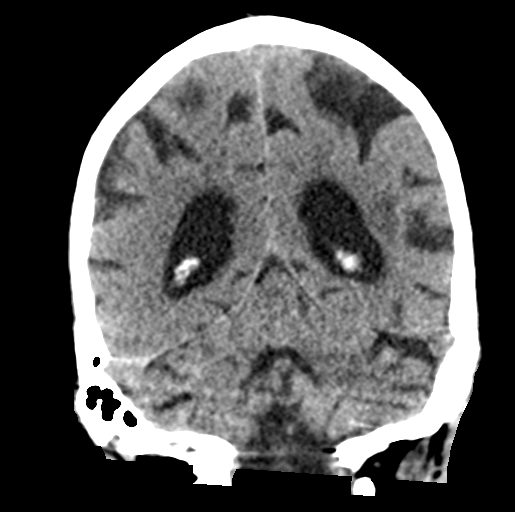
[im 30/67  brain]
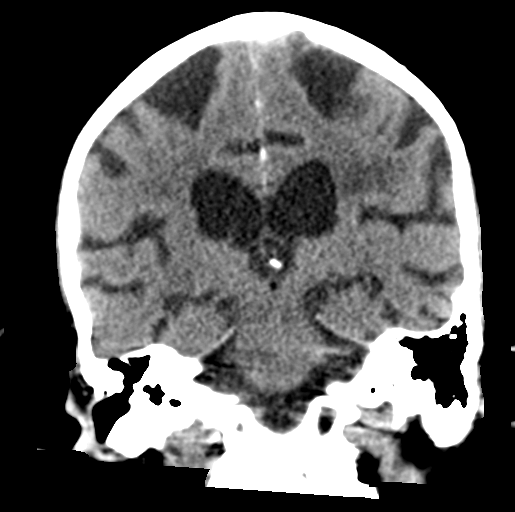
[im 37/67  brain]
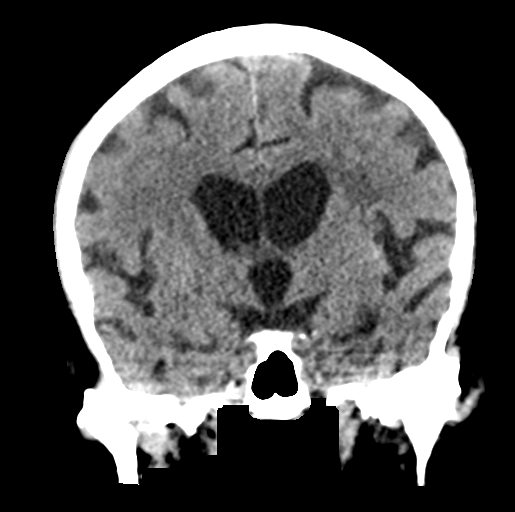

[Series 5: head 3.0 mpr sag · sagittal · 0.32mm/px · 3 of 51 slices shown]
[im 17/51  brain]
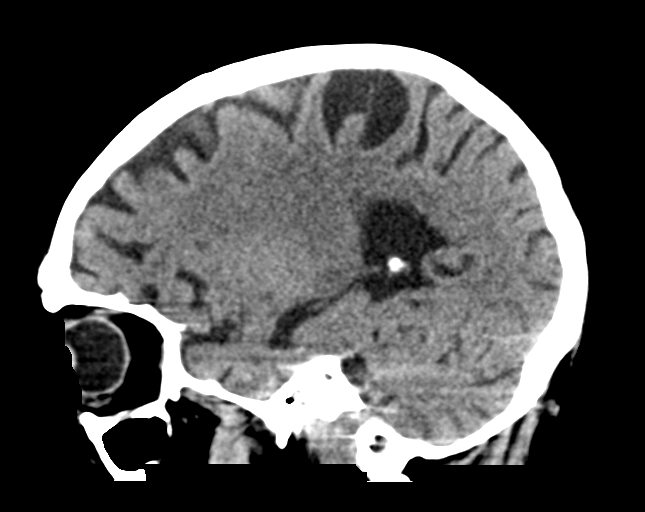
[im 26/51  brain]
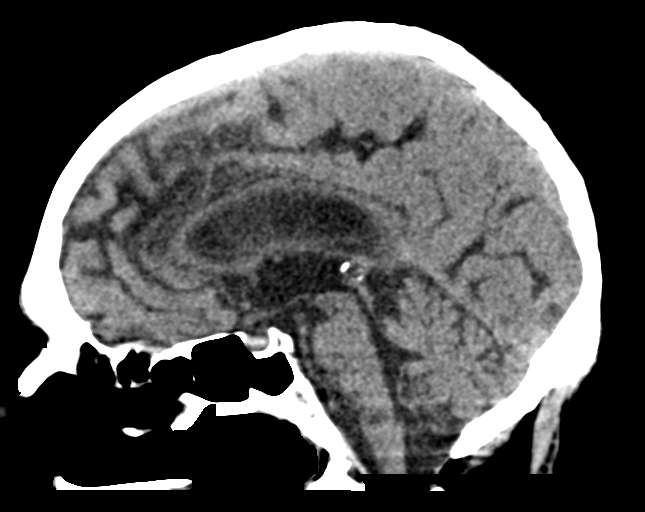
[im 34/51  brain]
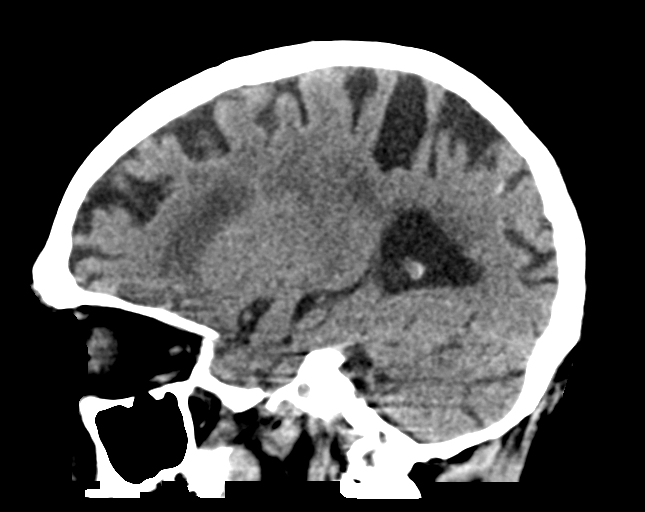

[15 of 47 positions shown; findings below may reference images not displayed]

FINDINGS: Brain: No subdural, epidural, or subarachnoid hemorrhage. No
intraparenchymal hemorrhage. White matter changes are identified,
more marked on the left than the right. The hyperdense left MCA on
the previous study is no longer visualized. Low-attenuation in the
left cerebellar hemisphere is unchanged, consistent with infarct.
There is remarkably little involvement of the left MCA territory
cortex given the recent left MCA ischemic event. There is likely
some involvement left frontal cortex such as on image 15. The
ventricles and sulci are prominent and stable. No mass, mass effect,
or midline shift. The basal cisterns are patent. The brainstem is
normal. No evidence of acute ischemia or infarct.

Vascular: No hyperdense vessel or unexpected calcification.

Skull: Normal. Negative for fracture or focal lesion.

Sinuses/Orbits: No acute finding.

Other: None.
IMPRESSION: 1. No bleed or evidence of acute ischemia. Infarcts are seen in the
left frontal lobe and left cerebellar hemisphere, unchanged. There
is remarkably little involvement of the left MCA territory cortex
given the recent left MCA ischemic event.
